# Patient Record
Sex: Female | Born: 1970
Health system: Southern US, Community
[De-identification: ages and names within clinical notes are randomized; demographics above are authoritative.]

## PROBLEM LIST (undated history)

## (undated) DIAGNOSIS — R52 Pain, unspecified: Secondary | ICD-10-CM

## (undated) DIAGNOSIS — A419 Sepsis, unspecified organism: Secondary | ICD-10-CM

## (undated) DIAGNOSIS — M1990 Unspecified osteoarthritis, unspecified site: Secondary | ICD-10-CM

## (undated) DIAGNOSIS — M7120 Synovial cyst of popliteal space [Baker], unspecified knee: Secondary | ICD-10-CM

## (undated) DIAGNOSIS — M869 Osteomyelitis, unspecified: Secondary | ICD-10-CM

## (undated) DIAGNOSIS — E669 Obesity, unspecified: Secondary | ICD-10-CM

## (undated) DIAGNOSIS — M545 Low back pain, unspecified: Secondary | ICD-10-CM

## (undated) DIAGNOSIS — T4145XA Adverse effect of unspecified anesthetic, initial encounter: Secondary | ICD-10-CM

## (undated) DIAGNOSIS — Z87442 Personal history of urinary calculi: Secondary | ICD-10-CM

## (undated) DIAGNOSIS — I10 Essential (primary) hypertension: Secondary | ICD-10-CM

## (undated) DIAGNOSIS — S4292XA Fracture of left shoulder girdle, part unspecified, initial encounter for closed fracture: Secondary | ICD-10-CM

## (undated) DIAGNOSIS — F32A Depression, unspecified: Secondary | ICD-10-CM

## (undated) DIAGNOSIS — K602 Anal fissure, unspecified: Secondary | ICD-10-CM

## (undated) DIAGNOSIS — F419 Anxiety disorder, unspecified: Secondary | ICD-10-CM

## (undated) DIAGNOSIS — F329 Major depressive disorder, single episode, unspecified: Secondary | ICD-10-CM

## (undated) DIAGNOSIS — T8859XA Other complications of anesthesia, initial encounter: Secondary | ICD-10-CM

## (undated) HISTORY — DX: Depression, unspecified: F32.A

## (undated) HISTORY — DX: Low back pain, unspecified: M54.50

## (undated) HISTORY — DX: Essential (primary) hypertension: I10

## (undated) HISTORY — PX: TONSILLECTOMY: SUR1361

## (undated) HISTORY — DX: Synovial cyst of popliteal space (Baker), unspecified knee: M71.20

## (undated) HISTORY — PX: CHOLECYSTECTOMY: SHX55

## (undated) HISTORY — DX: Fracture of left shoulder girdle, part unspecified, initial encounter for closed fracture: S42.92XA

## (undated) HISTORY — PX: TOTAL SHOULDER REPLACEMENT: SUR1217

## (undated) HISTORY — PX: APPENDECTOMY: SHX54

## (undated) HISTORY — DX: Anal fissure, unspecified: K60.2

## (undated) HISTORY — DX: Anxiety disorder, unspecified: F41.9

## (undated) HISTORY — PX: LASER ABLATION: SHX1947

## (undated) HISTORY — DX: Sepsis, unspecified organism: A41.9

## (undated) HISTORY — PX: TUBAL LIGATION: SHX77

## (undated) HISTORY — DX: Obesity, unspecified: E66.9

## (undated) HISTORY — DX: Osteomyelitis, unspecified: M86.9

## (undated) HISTORY — DX: Major depressive disorder, single episode, unspecified: F32.9

## (undated) HISTORY — DX: Low back pain: M54.5

---

## 1994-01-07 HISTORY — PX: PLANTAR FASCIA RELEASE: SHX2239

## 1995-01-08 HISTORY — PX: CHOLECYSTECTOMY: SHX55

## 1997-09-23 ENCOUNTER — Other Ambulatory Visit: Admission: RE | Admit: 1997-09-23 | Discharge: 1997-09-23 | Payer: Self-pay | Admitting: Obstetrics and Gynecology

## 1998-10-23 ENCOUNTER — Other Ambulatory Visit: Admission: RE | Admit: 1998-10-23 | Discharge: 1998-10-23 | Payer: Self-pay | Admitting: Obstetrics and Gynecology

## 1999-10-24 ENCOUNTER — Other Ambulatory Visit: Admission: RE | Admit: 1999-10-24 | Discharge: 1999-10-24 | Payer: Self-pay | Admitting: Obstetrics and Gynecology

## 2000-06-19 ENCOUNTER — Encounter: Payer: Self-pay | Admitting: Obstetrics and Gynecology

## 2000-06-19 ENCOUNTER — Ambulatory Visit (HOSPITAL_COMMUNITY): Admission: RE | Admit: 2000-06-19 | Discharge: 2000-06-19 | Payer: Self-pay | Admitting: Obstetrics and Gynecology

## 2000-11-11 ENCOUNTER — Other Ambulatory Visit: Admission: RE | Admit: 2000-11-11 | Discharge: 2000-11-11 | Payer: Self-pay | Admitting: Obstetrics and Gynecology

## 2002-01-07 HISTORY — PX: TOTAL SHOULDER REPLACEMENT: SUR1217

## 2002-01-07 HISTORY — PX: SHOULDER ARTHROSCOPY WITH OPEN ROTATOR CUFF REPAIR AND DISTAL CLAVICLE ACROMINECTOMY: SHX5683

## 2002-01-10 ENCOUNTER — Encounter: Payer: Self-pay | Admitting: Emergency Medicine

## 2002-01-10 ENCOUNTER — Inpatient Hospital Stay (HOSPITAL_COMMUNITY): Admission: EM | Admit: 2002-01-10 | Discharge: 2002-01-13 | Payer: Self-pay

## 2002-01-11 ENCOUNTER — Encounter: Payer: Self-pay | Admitting: Orthopedic Surgery

## 2002-01-26 ENCOUNTER — Inpatient Hospital Stay (HOSPITAL_COMMUNITY): Admission: RE | Admit: 2002-01-26 | Discharge: 2002-01-31 | Payer: Self-pay | Admitting: Orthopedic Surgery

## 2002-01-26 ENCOUNTER — Encounter: Payer: Self-pay | Admitting: Orthopedic Surgery

## 2002-01-28 ENCOUNTER — Encounter: Payer: Self-pay | Admitting: Orthopedic Surgery

## 2002-02-16 ENCOUNTER — Ambulatory Visit (HOSPITAL_COMMUNITY): Admission: RE | Admit: 2002-02-16 | Discharge: 2002-02-16 | Payer: Self-pay | Admitting: Orthopedic Surgery

## 2002-02-22 ENCOUNTER — Other Ambulatory Visit: Admission: RE | Admit: 2002-02-22 | Discharge: 2002-02-22 | Payer: Self-pay | Admitting: Obstetrics and Gynecology

## 2002-02-26 ENCOUNTER — Encounter: Payer: Self-pay | Admitting: Orthopedic Surgery

## 2002-02-26 ENCOUNTER — Encounter: Admission: RE | Admit: 2002-02-26 | Discharge: 2002-02-26 | Payer: Self-pay | Admitting: Orthopedic Surgery

## 2002-03-19 ENCOUNTER — Encounter: Payer: Self-pay | Admitting: Orthopedic Surgery

## 2002-03-19 ENCOUNTER — Ambulatory Visit (HOSPITAL_COMMUNITY): Admission: RE | Admit: 2002-03-19 | Discharge: 2002-03-19 | Payer: Self-pay | Admitting: Orthopedic Surgery

## 2002-03-23 ENCOUNTER — Ambulatory Visit (HOSPITAL_COMMUNITY): Admission: RE | Admit: 2002-03-23 | Discharge: 2002-03-24 | Payer: Self-pay | Admitting: Orthopedic Surgery

## 2002-03-23 ENCOUNTER — Encounter: Payer: Self-pay | Admitting: Orthopedic Surgery

## 2002-08-16 ENCOUNTER — Encounter: Payer: Self-pay | Admitting: Orthopedic Surgery

## 2002-08-18 ENCOUNTER — Encounter: Payer: Self-pay | Admitting: Orthopedic Surgery

## 2002-08-24 ENCOUNTER — Encounter: Payer: Self-pay | Admitting: Orthopedic Surgery

## 2002-08-24 ENCOUNTER — Observation Stay (HOSPITAL_COMMUNITY): Admission: RE | Admit: 2002-08-24 | Discharge: 2002-08-25 | Payer: Self-pay | Admitting: Orthopedic Surgery

## 2003-01-08 DIAGNOSIS — A419 Sepsis, unspecified organism: Secondary | ICD-10-CM

## 2003-01-08 HISTORY — DX: Sepsis, unspecified organism: A41.9

## 2003-02-24 ENCOUNTER — Other Ambulatory Visit: Admission: RE | Admit: 2003-02-24 | Discharge: 2003-02-24 | Payer: Self-pay | Admitting: Obstetrics and Gynecology

## 2003-07-13 ENCOUNTER — Ambulatory Visit (HOSPITAL_COMMUNITY): Admission: RE | Admit: 2003-07-13 | Discharge: 2003-07-13 | Payer: Self-pay | Admitting: Orthopaedic Surgery

## 2004-01-08 HISTORY — PX: TUBAL LIGATION: SHX77

## 2004-01-08 HISTORY — PX: LASER ABLATION: SHX1947

## 2004-01-12 ENCOUNTER — Ambulatory Visit: Payer: Self-pay | Admitting: Internal Medicine

## 2004-03-12 ENCOUNTER — Other Ambulatory Visit: Admission: RE | Admit: 2004-03-12 | Discharge: 2004-03-12 | Payer: Self-pay | Admitting: Obstetrics and Gynecology

## 2004-03-16 ENCOUNTER — Encounter: Admission: RE | Admit: 2004-03-16 | Discharge: 2004-03-16 | Payer: Self-pay | Admitting: Obstetrics and Gynecology

## 2004-04-19 ENCOUNTER — Ambulatory Visit: Payer: Self-pay | Admitting: Internal Medicine

## 2004-06-20 ENCOUNTER — Ambulatory Visit: Payer: Self-pay | Admitting: Internal Medicine

## 2004-07-24 ENCOUNTER — Ambulatory Visit: Payer: Self-pay | Admitting: Internal Medicine

## 2004-09-20 ENCOUNTER — Ambulatory Visit: Payer: Self-pay | Admitting: Internal Medicine

## 2005-01-25 ENCOUNTER — Ambulatory Visit: Payer: Self-pay | Admitting: Internal Medicine

## 2005-04-01 ENCOUNTER — Other Ambulatory Visit: Admission: RE | Admit: 2005-04-01 | Discharge: 2005-04-01 | Payer: Self-pay | Admitting: Obstetrics and Gynecology

## 2005-04-29 ENCOUNTER — Ambulatory Visit: Payer: Self-pay | Admitting: Internal Medicine

## 2005-05-26 IMAGING — XA IR CV CATH FLUORO GUIDE
1 series · 1 of 1 positions shown · non-contrast
Comparison: none

CLINICAL DATA: Post-op infection, 4 to 6 weeks of IV antibiotic therapy.
TECHNIQUE: The right arm was prepped with Betadine, draped in the usual sterile fashion, and infiltrated locally with 1% lidocaine.  Under real-time ultrasound guidance, the right basilic vein was accessed with a 21-gauge micropuncture needle.  The needle was exchanged over a guidewire for a peel-away sheath, through which a 5 french single lumen PICC catheter trimmed to 45 cm was advanced, positioned with its tip at the distal SVC/right atrial junction.  Spot chest radiograph confirms appropriate catheter position.  The catheter was flushed with heparinized saline, secured to the skin with Prolene sutures, and covered with a sterile dressing.  No immediate complication.
This procedure was performed by Erkki Ja Pirjo Ratas, PA.  Dr. Gio M Dorantes was the supervising physician.  
IMPRESSION
Technically successful right arm PICC placement with ultrasound.  Ready for routine use.

[Series 1: run · 1 of 1 slices shown]
[im 1/1]
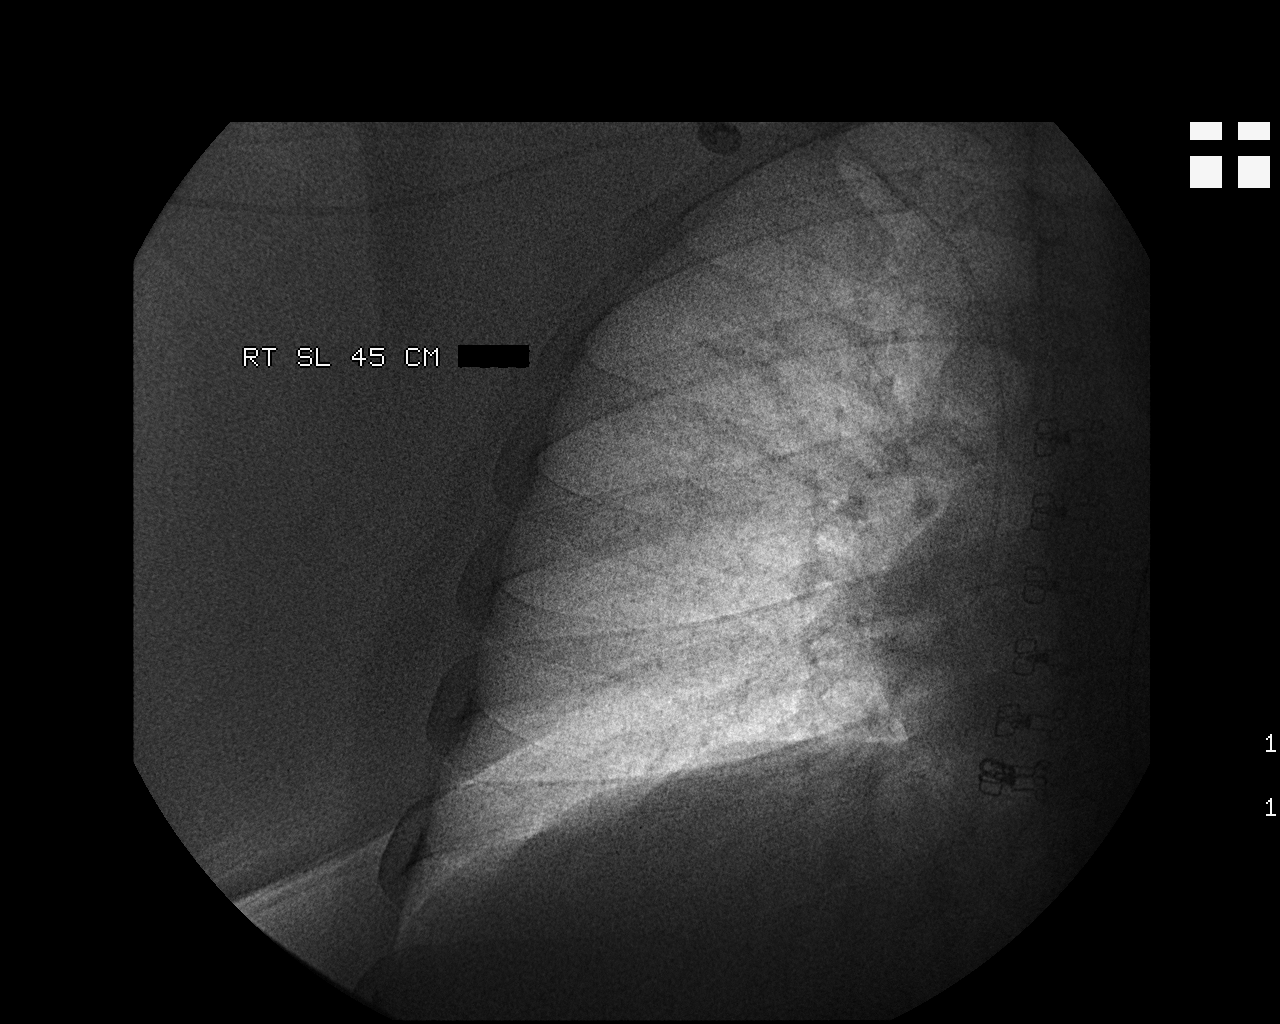

[1 of 1 positions shown; findings below may reference images not displayed]

## 2005-05-31 ENCOUNTER — Encounter (INDEPENDENT_AMBULATORY_CARE_PROVIDER_SITE_OTHER): Payer: Self-pay | Admitting: Specialist

## 2005-05-31 ENCOUNTER — Ambulatory Visit (HOSPITAL_COMMUNITY): Admission: RE | Admit: 2005-05-31 | Discharge: 2005-05-31 | Payer: Self-pay | Admitting: Obstetrics and Gynecology

## 2005-07-29 ENCOUNTER — Ambulatory Visit: Payer: Self-pay | Admitting: Internal Medicine

## 2006-06-06 ENCOUNTER — Emergency Department (HOSPITAL_COMMUNITY): Admission: EM | Admit: 2006-06-06 | Discharge: 2006-06-06 | Payer: Self-pay | Admitting: Family Medicine

## 2006-08-28 ENCOUNTER — Encounter: Payer: Self-pay | Admitting: Internal Medicine

## 2006-08-28 DIAGNOSIS — F32A Depression, unspecified: Secondary | ICD-10-CM | POA: Insufficient documentation

## 2006-08-28 DIAGNOSIS — M545 Low back pain, unspecified: Secondary | ICD-10-CM | POA: Insufficient documentation

## 2006-08-28 DIAGNOSIS — F329 Major depressive disorder, single episode, unspecified: Secondary | ICD-10-CM

## 2006-08-29 ENCOUNTER — Ambulatory Visit: Payer: Self-pay | Admitting: Internal Medicine

## 2006-08-29 LAB — CONVERTED CEMR LAB
Basophils Relative: 0.9 % (ref 0.0–1.0)
Bilirubin Urine: NEGATIVE
Bilirubin, Direct: 0.1 mg/dL (ref 0.0–0.3)
CO2: 29 meq/L (ref 19–32)
Eosinophils Relative: 1.3 % (ref 0.0–5.0)
GFR calc Af Amer: 146 mL/min
Glucose, Bld: 109 mg/dL — ABNORMAL HIGH (ref 70–99)
HDL: 31.8 mg/dL — ABNORMAL LOW (ref >39.0)
Hemoglobin: 13.9 g/dL (ref 12.0–15.0)
Leukocytes, UA: NEGATIVE
Lymphocytes Relative: 25 % (ref 12.0–46.0)
Monocytes Absolute: 0.8 10*3/uL — ABNORMAL HIGH (ref 0.2–0.7)
Monocytes Relative: 7.8 % (ref 3.0–11.0)
Neutro Abs: 6.4 10*3/uL (ref 1.4–7.7)
Neutrophils Relative %: 65 % (ref 43.0–77.0)
Potassium: 4.7 meq/L (ref 3.5–5.1)
Sodium: 138 meq/L (ref 135–145)
Specific Gravity, Urine: 1.02 (ref 1.000–1.03)
Total Protein, Urine: NEGATIVE mg/dL
Total Protein: 6.8 g/dL (ref 6.0–8.3)
Triglycerides: 69 mg/dL (ref 0–149)
WBC: 9.9 10*3/uL (ref 4.5–10.5)

## 2006-12-26 ENCOUNTER — Ambulatory Visit: Payer: Self-pay | Admitting: Internal Medicine

## 2006-12-26 DIAGNOSIS — F419 Anxiety disorder, unspecified: Secondary | ICD-10-CM | POA: Insufficient documentation

## 2006-12-26 DIAGNOSIS — I10 Essential (primary) hypertension: Secondary | ICD-10-CM | POA: Insufficient documentation

## 2006-12-26 DIAGNOSIS — F410 Panic disorder [episodic paroxysmal anxiety] without agoraphobia: Secondary | ICD-10-CM | POA: Insufficient documentation

## 2006-12-26 DIAGNOSIS — J45909 Unspecified asthma, uncomplicated: Secondary | ICD-10-CM | POA: Insufficient documentation

## 2007-01-08 HISTORY — PX: FRACTURE SURGERY: SHX138

## 2007-02-03 ENCOUNTER — Encounter: Admission: RE | Admit: 2007-02-03 | Discharge: 2007-02-03 | Payer: Self-pay | Admitting: Obstetrics and Gynecology

## 2007-03-30 ENCOUNTER — Ambulatory Visit: Payer: Self-pay | Admitting: Internal Medicine

## 2007-04-24 ENCOUNTER — Encounter: Payer: Self-pay | Admitting: Internal Medicine

## 2007-05-19 ENCOUNTER — Encounter: Payer: Self-pay | Admitting: Internal Medicine

## 2007-06-03 ENCOUNTER — Emergency Department (HOSPITAL_COMMUNITY): Admission: EM | Admit: 2007-06-03 | Discharge: 2007-06-03 | Payer: Self-pay | Admitting: Emergency Medicine

## 2007-06-19 ENCOUNTER — Encounter: Payer: Self-pay | Admitting: Internal Medicine

## 2007-07-28 ENCOUNTER — Ambulatory Visit: Payer: Self-pay | Admitting: Internal Medicine

## 2007-07-28 DIAGNOSIS — F17200 Nicotine dependence, unspecified, uncomplicated: Secondary | ICD-10-CM | POA: Insufficient documentation

## 2007-08-09 ENCOUNTER — Inpatient Hospital Stay (HOSPITAL_COMMUNITY): Admission: EM | Admit: 2007-08-09 | Discharge: 2007-08-11 | Payer: Self-pay | Admitting: Emergency Medicine

## 2007-09-23 ENCOUNTER — Encounter: Payer: Self-pay | Admitting: Internal Medicine

## 2007-10-19 ENCOUNTER — Telehealth: Payer: Self-pay | Admitting: Internal Medicine

## 2007-11-04 ENCOUNTER — Ambulatory Visit: Payer: Self-pay | Admitting: Internal Medicine

## 2007-11-05 ENCOUNTER — Encounter: Payer: Self-pay | Admitting: Internal Medicine

## 2007-11-05 ENCOUNTER — Ambulatory Visit: Payer: Self-pay | Admitting: Internal Medicine

## 2007-11-06 LAB — CONVERTED CEMR LAB
ALT: 18 units/L (ref 0–35)
AST: 17 units/L (ref 0–37)
Albumin: 3.3 g/dL — ABNORMAL LOW (ref 3.5–5.2)
BUN: 9 mg/dL (ref 6–23)
Basophils Absolute: 0 10*3/uL (ref 0.0–0.1)
Basophils Relative: 0.3 % (ref 0.0–3.0)
CO2: 29 meq/L (ref 19–32)
Calcium: 9 mg/dL (ref 8.4–10.5)
Cholesterol: 159 mg/dL (ref 0–200)
Creatinine, Ser: 0.6 mg/dL (ref 0.4–1.2)
Eosinophils Relative: 1.2 % (ref 0.0–5.0)
Hemoglobin: 13.6 g/dL (ref 12.0–15.0)
Hgb A1c MFr Bld: 5.7 % (ref 4.6–6.0)
Ketones, ur: NEGATIVE mg/dL
Leukocytes, UA: NEGATIVE
Lymphocytes Relative: 20.7 % (ref 12.0–46.0)
MCHC: 34.4 g/dL (ref 30.0–36.0)
MCV: 88.2 fL (ref 78.0–100.0)
Neutro Abs: 6.6 10*3/uL (ref 1.4–7.7)
RBC: 4.48 M/uL (ref 3.87–5.11)
Specific Gravity, Urine: 1.025 (ref 1.000–1.03)
TSH: 3.41 microintl units/mL (ref 0.35–5.50)
Total Bilirubin: 0.5 mg/dL (ref 0.3–1.2)
Total Protein: 6.6 g/dL (ref 6.0–8.3)
Triglycerides: 71 mg/dL (ref 0–149)
Urine Glucose: NEGATIVE mg/dL
WBC: 9.4 10*3/uL (ref 4.5–10.5)
pH: 6 (ref 5.0–8.0)

## 2007-12-01 ENCOUNTER — Telehealth: Payer: Self-pay | Admitting: Internal Medicine

## 2007-12-11 ENCOUNTER — Emergency Department (HOSPITAL_COMMUNITY): Admission: EM | Admit: 2007-12-11 | Discharge: 2007-12-11 | Payer: Self-pay | Admitting: Family Medicine

## 2008-02-05 ENCOUNTER — Ambulatory Visit: Payer: Self-pay | Admitting: Internal Medicine

## 2008-05-04 ENCOUNTER — Ambulatory Visit: Payer: Self-pay | Admitting: Internal Medicine

## 2008-05-17 ENCOUNTER — Telehealth: Payer: Self-pay | Admitting: Internal Medicine

## 2008-06-03 ENCOUNTER — Ambulatory Visit: Payer: Self-pay | Admitting: Internal Medicine

## 2008-06-03 DIAGNOSIS — L259 Unspecified contact dermatitis, unspecified cause: Secondary | ICD-10-CM | POA: Insufficient documentation

## 2008-07-12 ENCOUNTER — Emergency Department (HOSPITAL_COMMUNITY): Admission: EM | Admit: 2008-07-12 | Discharge: 2008-07-12 | Payer: Self-pay | Admitting: Family Medicine

## 2008-08-25 ENCOUNTER — Ambulatory Visit: Payer: Self-pay | Admitting: Internal Medicine

## 2008-08-25 LAB — CONVERTED CEMR LAB
Albumin: 3.3 g/dL — ABNORMAL LOW (ref 3.5–5.2)
BUN: 9 mg/dL (ref 6–23)
Basophils Absolute: 0 10*3/uL (ref 0.0–0.1)
Calcium: 8.8 mg/dL (ref 8.4–10.5)
Creatinine, Ser: 0.6 mg/dL (ref 0.4–1.2)
Eosinophils Relative: 1.1 % (ref 0.0–5.0)
GFR calc non Af Amer: 119.27 mL/min (ref >60)
Glucose, Bld: 104 mg/dL — ABNORMAL HIGH (ref 70–99)
HCT: 40.6 % (ref 36.0–46.0)
Hemoglobin: 13.8 g/dL (ref 12.0–15.0)
Lymphocytes Relative: 23.8 % (ref 12.0–46.0)
Lymphs Abs: 2 10*3/uL (ref 0.7–4.0)
Monocytes Relative: 8.8 % (ref 3.0–12.0)
Neutro Abs: 5.7 10*3/uL (ref 1.4–7.7)
RDW: 13.3 % (ref 11.5–14.6)
TSH: 3.63 microintl units/mL (ref 0.35–5.50)
Total Bilirubin: 0.8 mg/dL (ref 0.3–1.2)
WBC: 8.6 10*3/uL (ref 4.5–10.5)

## 2008-08-29 ENCOUNTER — Ambulatory Visit: Payer: Self-pay | Admitting: Internal Medicine

## 2008-09-02 ENCOUNTER — Encounter: Payer: Self-pay | Admitting: Internal Medicine

## 2008-10-04 ENCOUNTER — Ambulatory Visit: Payer: Self-pay | Admitting: Internal Medicine

## 2008-11-03 ENCOUNTER — Ambulatory Visit: Payer: Self-pay | Admitting: Internal Medicine

## 2008-11-18 ENCOUNTER — Encounter: Admission: RE | Admit: 2008-11-18 | Discharge: 2008-11-18 | Payer: Self-pay | Admitting: Orthopedic Surgery

## 2008-12-06 ENCOUNTER — Ambulatory Visit: Payer: Self-pay | Admitting: Internal Medicine

## 2008-12-06 DIAGNOSIS — J069 Acute upper respiratory infection, unspecified: Secondary | ICD-10-CM | POA: Insufficient documentation

## 2008-12-06 DIAGNOSIS — M25519 Pain in unspecified shoulder: Secondary | ICD-10-CM | POA: Insufficient documentation

## 2009-01-05 ENCOUNTER — Encounter: Payer: Self-pay | Admitting: Internal Medicine

## 2009-04-26 ENCOUNTER — Telehealth (INDEPENDENT_AMBULATORY_CARE_PROVIDER_SITE_OTHER): Payer: Self-pay | Admitting: *Deleted

## 2009-06-07 ENCOUNTER — Telehealth (INDEPENDENT_AMBULATORY_CARE_PROVIDER_SITE_OTHER): Payer: Self-pay | Admitting: *Deleted
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
## 2009-08-24 ENCOUNTER — Telehealth: Payer: Self-pay | Admitting: Internal Medicine

## 2009-10-03 ENCOUNTER — Telehealth (INDEPENDENT_AMBULATORY_CARE_PROVIDER_SITE_OTHER): Payer: Self-pay | Admitting: *Deleted

## 2009-10-31 ENCOUNTER — Ambulatory Visit: Payer: Self-pay | Admitting: Internal Medicine

## 2009-10-31 DIAGNOSIS — M25529 Pain in unspecified elbow: Secondary | ICD-10-CM | POA: Insufficient documentation

## 2009-11-22 ENCOUNTER — Telehealth: Payer: Self-pay | Admitting: Internal Medicine

## 2009-12-28 ENCOUNTER — Encounter
Admission: RE | Admit: 2009-12-28 | Discharge: 2009-12-28 | Payer: Self-pay | Source: Home / Self Care | Attending: Obstetrics and Gynecology | Admitting: Obstetrics and Gynecology

## 2010-01-02 ENCOUNTER — Telehealth: Payer: Self-pay | Admitting: Internal Medicine

## 2010-01-05 ENCOUNTER — Ambulatory Visit
Admission: RE | Admit: 2010-01-05 | Discharge: 2010-01-05 | Payer: Self-pay | Source: Home / Self Care | Attending: Internal Medicine | Admitting: Internal Medicine

## 2010-01-05 ENCOUNTER — Encounter: Payer: Self-pay | Admitting: Internal Medicine

## 2010-01-05 DIAGNOSIS — K921 Melena: Secondary | ICD-10-CM | POA: Insufficient documentation

## 2010-01-05 DIAGNOSIS — K6289 Other specified diseases of anus and rectum: Secondary | ICD-10-CM | POA: Insufficient documentation

## 2010-01-07 DIAGNOSIS — K602 Anal fissure, unspecified: Secondary | ICD-10-CM

## 2010-01-07 HISTORY — DX: Anal fissure, unspecified: K60.2

## 2010-01-12 ENCOUNTER — Ambulatory Visit
Admission: RE | Admit: 2010-01-12 | Discharge: 2010-01-12 | Payer: Self-pay | Source: Home / Self Care | Attending: Gastroenterology | Admitting: Gastroenterology

## 2010-01-26 ENCOUNTER — Encounter (INDEPENDENT_AMBULATORY_CARE_PROVIDER_SITE_OTHER): Payer: Self-pay | Admitting: *Deleted

## 2010-01-29 ENCOUNTER — Other Ambulatory Visit: Payer: Self-pay | Admitting: Internal Medicine

## 2010-01-29 ENCOUNTER — Ambulatory Visit
Admission: RE | Admit: 2010-01-29 | Discharge: 2010-01-29 | Payer: Self-pay | Source: Home / Self Care | Attending: Internal Medicine | Admitting: Internal Medicine

## 2010-01-29 LAB — LIPID PANEL
HDL: 36.3 mg/dL — ABNORMAL LOW (ref >39.00)
Total CHOL/HDL Ratio: 5
VLDL: 19.4 mg/dL (ref 0.0–40.0)

## 2010-01-29 LAB — CBC WITH DIFFERENTIAL/PLATELET
Eosinophils Relative: 0.8 % (ref 0.0–5.0)
Lymphocytes Relative: 19.9 % (ref 12.0–46.0)
Monocytes Relative: 8.2 % (ref 3.0–12.0)
Neutrophils Relative %: 70.6 % (ref 43.0–77.0)
Platelets: 274 10*3/uL (ref 150.0–400.0)
WBC: 9.3 10*3/uL (ref 4.5–10.5)

## 2010-01-29 LAB — HEPATIC FUNCTION PANEL
AST: 18 U/L (ref 0–37)
Alkaline Phosphatase: 97 U/L (ref 39–117)
Bilirubin, Direct: 0.1 mg/dL (ref 0.0–0.3)

## 2010-01-29 LAB — BASIC METABOLIC PANEL
CO2: 28 mEq/L (ref 19–32)
Calcium: 9 mg/dL (ref 8.4–10.5)
GFR: 136.58 mL/min (ref >60.00)
Potassium: 4.5 mEq/L (ref 3.5–5.1)
Sodium: 137 mEq/L (ref 135–145)

## 2010-01-29 LAB — URINALYSIS
Leukocytes, UA: NEGATIVE
Nitrite: NEGATIVE
Specific Gravity, Urine: 1.025 (ref 1.000–1.030)
pH: 5.5 (ref 5.0–8.0)

## 2010-01-29 LAB — TSH: TSH: 2.84 u[IU]/mL (ref 0.35–5.50)

## 2010-02-01 ENCOUNTER — Ambulatory Visit
Admission: RE | Admit: 2010-02-01 | Discharge: 2010-02-01 | Payer: Self-pay | Source: Home / Self Care | Attending: Internal Medicine | Admitting: Internal Medicine

## 2010-02-06 NOTE — Progress Notes (Signed)
Summary: xanax  Phone Note Refill Request Message from:  [employer] on June 07, 2009 8:35 AM  Refills Requested: Medication #1:  XANAX 1 MG  TABS 1 three times a day prn # [address] [phone] Last ov 12/06/08  Next Appointment Scheduled: none Initial call taken by: Orlan Leavens,  June 07, 2009 8:36 AM  Follow-up for Phone Call        Is this ok to refill ? Follow-up by: Orlan Leavens,  June 07, 2009 8:36 AM  Additional Follow-up for Phone Call Additional follow up Details #1::        OK 3 ref Additional Follow-up by: Tresa Garter MD,  June 07, 2009 5:49 PM    Additional Follow-up for Phone Call Additional follow up Details #2::    Printed RX Follow-up by: Josph Macho RMA,  June 08, 2009 9:36 AM

## 2010-02-06 NOTE — Progress Notes (Signed)
Summary: REFILL - HYDROCODONE  Phone Note Refill Request   Refills Requested: Medication #1:  HYDROCODONE-ACETAMINOPHEN 5-500 MG  TABS 1 po qid as needed [address]  Initial call taken by: Lamar Sprinkles, CMA,  November 22, 2009 5:22 PM  Follow-up for Phone Call        ok to ref Follow-up by: Tresa Garter MD,  November 22, 2009 5:38 PM    Prescriptions: HYDROCODONE-ACETAMINOPHEN 5-500 MG  TABS (HYDROCODONE-ACETAMINOPHEN) 1 po qid as needed  #120 x 0   Entered by:   Lamar Sprinkles, CMA   Authorized by:   Tresa Garter MD   Signed by:   Lamar Sprinkles, CMA on 11/22/2009   Method used:   Telephoned to ...       [facility] [address] [phone]* (retail)       [address]       [place], [place]  [address]       Ph: [phone]       Fax: [phone]   RxID:   [id number]

## 2010-02-06 NOTE — Progress Notes (Signed)
Summary: Rf Hydroco-Acetamin  Phone Note Refill Request Message from:  Pharmacy  Refills Requested: Medication #1:  HYDROCODONE-ACETAMINOPHEN 5-500 MG  TABS 1 po qid as needed   Dosage confirmed as above?Dosage Confirmed   Supply Requested: 120  Method Requested: Telephone to Pharmacy Next Appointment Scheduled: none Initial call taken by: Lanier Prude, [facility]),  August 24, 2009 1:43 PM  Follow-up for Phone Call        ok to ref Follow-up by: Tresa Garter MD,  August 24, 2009 1:52 PM  Additional Follow-up for Phone Call Additional follow up Details #1::        Rx called to pharmacy Additional Follow-up by: Lanier Prude, [facility]),  August 25, 2009 8:17 AM    Prescriptions: HYDROCODONE-ACETAMINOPHEN 5-500 MG  TABS (HYDROCODONE-ACETAMINOPHEN) 1 po qid as needed  #120 x 0   Entered by:   Lanier Prude, CMA(AAMA)   Authorized by:   Tresa Garter MD   Signed by:   Lanier Prude, CMA(AAMA) on 08/25/2009   Method used:   Telephoned to ...       [facility] [address] [phone]* (retail)       [address]       [place], [place]  [address]       Ph: [phone]       Fax: [phone]   RxID:   [id number]

## 2010-02-06 NOTE — Progress Notes (Signed)
----   Converted from flag ---- ---- 10/02/2009 10:16 AM, Lanier Prude, CMA(AAMA) wrote: Please sched pt for OV with PCP.     Thanks!! ------------------------------ Gave pt appt:  10/10/09 @ 915A w/Dr Plotnikov-phone

## 2010-02-06 NOTE — Assessment & Plan Note (Signed)
Summary: PER FLAG/STACEY-OV--PHONE--STC   Vital Signs:  Patient profile:   40 year old female Height:      68 inches Weight:      387.25 pounds BMI:     59.09 O2 Sat:      97 % Temp:     97.8 degrees F oral Pulse rate:   97 / minute BP sitting:   122 / 68  (left arm) Cuff size:   large  Vitals Entered By: Jarome Lamas (October 31, 2009 11:18 AM) CC: follow up/pb Comments pt not taking phentermine hcl and robaxin/pb   CC:  follow up/pb.  History of Present Illness: The patient presents for a follow up of hypertension, anxiety, depression, hyperlipidemia  C/o wt gain C/o L elbow pain 4 months   Current Medications (verified): 1)  Diovan 160 Mg  Tabs (Valsartan) .... Once Daily 2)  Wellbutrin Sr 100 Mg  Tb12 (Bupropion Hcl) .... Two Times A Day 3)  Hydrocodone-Acetaminophen 5-500 Mg  Tabs (Hydrocodone-Acetaminophen) .[name].. 1 Po Qid As Needed 4)  Xanax 1 Mg  Tabs (Alprazolam) .[name].. 1 Three Times A Day Prn 5)  Ventolin Hfa 108 (90 Base) Mcg/act  Aers (Albuterol Sulfate) .... 2 Inh Qid Prn 6)  Multi-Vitamin  Tabs (Multiple Vitamin) 7)  Vitamin D3 1000 Unit  Tabs (Cholecalciferol) .[name].. 1 By Mouth Daily 8)  Paroxetine Hcl 10 Mg Tabs (Paroxetine Hcl) .... One By Mouth Daily 9)  Furosemide 40 Mg Tabs (Furosemide) .... Take One or Two  Tablet Daily As Needed Swelling 10)  Potassium Chloride Cr 10 Meq  Tbcr (Potassium Chloride) .[name].. 1 By Mouth Once Daily W/furosemide Prn 11)  Phentermine Hcl 37.5 Mg Tabs (Phentermine Hcl) .[name].. 1 By Mouth Qam 12)  Robaxin 100 Mg/ml Soln (Methocarbamol) .[name].. 1 By Mouth Q8hr As Needed  Allergies (verified): No Known Drug Allergies  Past History:  Past Medical History: Last updated: 12/26/2006 Depression Low back pain Asthma Anxiety Hypertension Obesity  Social History: Last updated: 12/26/2006 Occupation: housewife Married Current Smoker Alcohol use-no  Review of Systems       The patient complains of weight gain.  The patient denies  difficulty walking and depression.    Physical Exam  General:  alert and overweight-appearing.   Nose:  no external deformity and no nasal discharge.   Mouth:  no gingival abnormalities and pharynx pink and moist.   Neck:  supple and no masses.   Lungs:  normal respiratory effort and normal breath sounds.   Heart:  normal rate and regular rhythm.   Abdomen:  Bowel sounds positive,abdomen soft and non-tender without masses, organomegaly or hernias noted. Msk:  L elbow is tender in postero-lat aspect Neurologic:  No cranial nerve deficits noted. Station and gait are normal. Plantar reflexes are down-going bilaterally. DTRs are symmetrical throughout. Sensory, motor and coordinative functions appear intact. Skin:  Clear Psych:  Cognition and judgment appear intact   Impression & Recommendations:  Problem # 1:  ELBOW PAIN (ICD-719.42) L tendonitis Assessment New Pennsaid, ice  Problem # 2:  OBESITY, MORBID (ICD-278.01) Assessment: Deteriorated See "Patient Instructions". Lap band is not covered. Risks vs benefits and controversies of a long term phentermine use were discussed.   Problem # 3:  ANXIETY (ICD-300.00) Assessment: Unchanged  Her updated medication list for this problem includes:    Wellbutrin Sr 100 Mg Tb12 (Bupropion hcl) .[name]..[name] Two times a day    Xanax 1 Mg Tabs (Alprazolam) .[name]... 1 three times a day prn  Paroxetine Hcl 10 Mg Tabs (Paroxetine hcl) ..... One by mouth daily  Problem # 4:  DEPRESSION (ICD-311) Assessment: Unchanged  Her updated medication list for this problem includes:    Wellbutrin Sr 100 Mg Tb12 (Bupropion hcl) .[name]..[name] Two times a day    Xanax 1 Mg Tabs (Alprazolam) .[name]... 1 three times a day prn    Paroxetine Hcl 10 Mg Tabs (Paroxetine hcl) ..... One by mouth daily  Problem # 5:  HYPERTENSION (ICD-401.9) Assessment: Unchanged  Her updated medication list for this problem includes:    Diovan 160 Mg Tabs (Valsartan) ..... Once daily     Furosemide 40 Mg Tabs (Furosemide) .[name]... Take one or two  tablet daily as needed swelling  BP today: 122/68 Prior BP: 108/82 (12/06/2008)  Labs Reviewed: K+: 4.4 (08/25/2008) Creat: : 0.6 (08/25/2008)   Chol: 159 (11/05/2007)   HDL: 32.2 (11/05/2007)   LDL: 113 (11/05/2007)   TG: 71 (11/05/2007)  Complete Medication List: 1)  Diovan 160 Mg Tabs (Valsartan) .... Once daily 2)  Wellbutrin Sr 100 Mg Tb12 (Bupropion hcl) .... Two times a day 3)  Hydrocodone-acetaminophen 5-500 Mg Tabs (Hydrocodone-acetaminophen) .[name].. 1 po qid as needed 4)  Xanax 1 Mg Tabs (Alprazolam) .[name].. 1 three times a day prn 5)  Ventolin Hfa 108 (90 Base) Mcg/act Aers (Albuterol sulfate) .... 2 inh qid prn 6)  Multi-vitamin Tabs (Multiple vitamin) 7)  Vitamin D3 1000 Unit Tabs (Cholecalciferol) .[name].. 1 by mouth daily 8)  Paroxetine Hcl 10 Mg Tabs (Paroxetine hcl) .... One by mouth daily 9)  Furosemide 40 Mg Tabs (Furosemide) .... Take one or two  tablet daily as needed swelling 10)  Potassium Chloride Cr 10 Meq Tbcr (Potassium chloride) .[name].. 1 by mouth once daily w/furosemide prn 11)  Phentermine Hcl 37.5 Mg Tabs (Phentermine hcl) .[name].. 1 by mouth qam 12)  Ibuprofen 600 Mg Tabs (Ibuprofen) .[name].. 1 by mouth bid  pc x 1 wk then as needed for  pain 13)  Pennsaid 1.5 % Soln (Diclofenac sodium) .... 3-5 gtt on skin three times a day for pain  Patient Instructions: 1)  Please schedule a follow-up appointment in 3 months. 2)  BMP prior to visit, ICD-9: 3)  Hepatic Panel prior to visit, ICD-9: 4)  Lipid Panel prior to visit, ICD-9: 5)  TSH prior to visit, ICD-9:  v70.0 6)  CBC w/ Diff prior to visit, ICD-9: 7)  Urine-dip prior to visit, ICD-9: Prescriptions: XANAX 1 MG  TABS (ALPRAZOLAM) 1 three times a day prn  #90 x 3   Entered and Authorized by:   Tresa Garter MD   Signed by:   Tresa Garter MD on 10/31/2009   Method used:   Print then Give to Patient   RxID:   [id number] PHENTERMINE HCL 37.5 MG TABS  (PHENTERMINE HCL) 1 by mouth qam  #30 x 2   Entered and Authorized by:   Tresa Garter MD   Signed by:   Tresa Garter MD on 10/31/2009   Method used:   Print then Give to Patient   RxID:   [id number] PENNSAID 1.5 % SOLN (DICLOFENAC SODIUM) 3-5 gtt on skin three times a day for pain  #1 x 3   Entered and Authorized by:   Tresa Garter MD   Signed by:   Tresa Garter MD on 10/31/2009   Method used:   Print then Give to Patient   RxID:   [id number] IBUPROFEN 600 MG TABS (IBUPROFEN)  1 by mouth bid  pc x 1 wk then as needed for  pain  #60 x 3   Entered and Authorized by:   Tresa Garter MD   Signed by:   Tresa Garter MD on 10/31/2009   Method used:   Print then Give to Patient   RxID:   [id number]    Orders Added: 1)  Est. Patient Level IV [14782]

## 2010-02-06 NOTE — Progress Notes (Signed)
Summary: Phentermine refill  Phone Note Refill Request Message from:  Fax from Pharmacy on April 26, 2009 11:31 AM  Refills Requested: Medication #1:  PHENTERMINE HCL 37.5 MG TABS 1 by mouth qam Initial call taken by: Lucious Groves,  April 26, 2009 11:31 AM  Follow-up for Phone Call        ok 2 ref Follow-up by: Tresa Garter MD,  April 26, 2009 1:06 PM    Prescriptions: PHENTERMINE HCL 37.5 MG TABS (PHENTERMINE HCL) 1 by mouth qam  #30 x 2   Entered by:   Ami Bullins CMA   Authorized by:   Tresa Garter MD   Signed by:   Bill Salinas CMA on 04/26/2009   Method used:   Telephoned to ...       [facility] [address] [phone]* (retail)       [address]       [place], [place]  [address]       Ph: [phone] or [phone]       Fax: [phone]   RxID:   [id number]

## 2010-02-08 NOTE — Progress Notes (Signed)
Summary: Rf Hydroco/Aceatm  Phone Note Refill Request Message from:  Pharmacy  Refills Requested: Medication #1:  HYDROCODONE-ACETAMINOPHEN 5-500 MG  TABS 1 po qid as needed   Dosage confirmed as above?Dosage Confirmed   Supply Requested: 120   Last Refilled: 11/23/[address]   Method Requested: Telephone to Pharmacy Next Appointment Scheduled: 02-01-10 Initial call taken by: Lanier Prude, [facility]),  January 02, 2010 11:49 AM  Follow-up for Phone Call        ok to ref  Keep return office visit  Follow-up by: Tresa Garter MD,  January 02, 2010 2:55 PM  Additional Follow-up for Phone Call Additional follow up Details #1::        Rx called to pharmacy Additional Follow-up by: Lanier Prude, [facility]),  January 02, 2010 4:23 PM    Prescriptions: HYDROCODONE-ACETAMINOPHEN 5-500 MG  TABS (HYDROCODONE-ACETAMINOPHEN) 1 po qid as needed  #120 x 0   Entered by:   Lanier Prude, CMA(AAMA)   Authorized by:   Tresa Garter MD   Signed by:   Lanier Prude, CMA(AAMA) on 01/02/2010   Method used:   Telephoned to ...       [facility] [address] [phone]* (retail)       [address]       [place], [place]  [address]       Ph: [phone]       Fax: [phone]   RxID:   [id number]

## 2010-02-08 NOTE — Assessment & Plan Note (Signed)
Summary: BLOOD IN STOOL /NWS   Vital Signs:  Patient profile:   40 year old female Height:      68 inches Weight:      388 pounds BMI:     59.21 O2 Sat:      97 % on Room air Temp:     98.5 degrees F oral Pulse rate:   104 / minute BP sitting:   118 / 86  (left arm) Cuff size:   large  Vitals Entered By: Bill Salinas CMA (January 05, 2010 4:29 PM)  O2 Flow:  Room air CC: pt here with c/o BRB in her stools. She states blood with BM is greater than a spoonful, she has had no relief using tucks pads and states she has no hemrrhoids/ ab   CC:  pt here with c/o BRB in her stools. She states blood with BM is greater than a spoonful and she has had no relief using tucks pads and states she has no hemrrhoids/ ab.  History of Present Illness: C/o pain in anal area  x 2-3 d worse w/BMs - severe pain at times. She has 2-3 nl BMs a day as a rule. She had blood this am and this pm in the toilet bowl - a tea spoon each time; not constipated.  Current Medications (verified): 1)  Diovan 160 Mg  Tabs (Valsartan) .... Once Daily 2)  Wellbutrin Sr 100 Mg  Tb12 (Bupropion Hcl) .... Two Times A Day 3)  Hydrocodone-Acetaminophen 5-500 Mg  Tabs (Hydrocodone-Acetaminophen) .[name].. 1 Po Qid As Needed 4)  Xanax 1 Mg  Tabs (Alprazolam) .[name].. 1 Three Times A Day Prn 5)  Ventolin Hfa 108 (90 Base) Mcg/act  Aers (Albuterol Sulfate) .... 2 Inh Qid Prn 6)  Multi-Vitamin  Tabs (Multiple Vitamin) 7)  Vitamin D3 1000 Unit  Tabs (Cholecalciferol) .[name].. 1 By Mouth Daily 8)  Paroxetine Hcl 10 Mg Tabs (Paroxetine Hcl) .... One By Mouth Daily 9)  Furosemide 40 Mg Tabs (Furosemide) .... Take One or Two  Tablet Daily As Needed Swelling 10)  Potassium Chloride Cr 10 Meq  Tbcr (Potassium Chloride) .[name].. 1 By Mouth Once Daily W/furosemide Prn 11)  Phentermine Hcl 37.5 Mg Tabs (Phentermine Hcl) .[name].. 1 By Mouth Qam 12)  Ibuprofen 600 Mg Tabs (Ibuprofen) .[name].. 1 By Mouth Bid  Pc X 1 Wk Then As Needed For  Pain 13)  Pennsaid 1.5 %  Soln (Diclofenac Sodium) .... 3-5 Gtt On Skin Three Times A Day For Pain  Allergies (verified): No Known Drug Allergies  Past History:  Past Medical History: Last updated: 12/26/2006 Depression Low back pain Asthma Anxiety Hypertension Obesity  Social History: Last updated: 12/26/2006 Occupation: housewife Married Current Smoker Alcohol use-no  Review of Systems       The patient complains of hematochezia.  The patient denies fever, weight loss, melena, and abdominal pain.    Physical Exam  General:  alert and overweight-appearing.   Rectal:  refused   Impression & Recommendations:  Problem # 1:  ANAL OR RECTAL PAIN (ZOX-096.04) likely  a fissure; less likely int hemorrhoid vs other Assessment New She refused rectal exam and anoscopy today. Will treat for presumed fissure. Orders: Gastroenterology Referral (GI) dr Juanda Chance  Problem # 2:  HEMATOCHEZIA (ICD-578.1) due to above Assessment: New  Orders: Gastroenterology Referral (GI)  Complete Medication List: 1)  Diovan 160 Mg Tabs (Valsartan) .... Once daily 2)  Wellbutrin Sr 100 Mg Tb12 (Bupropion hcl) .... Two times a day 3)  Hydrocodone-acetaminophen 5-500 Mg Tabs (Hydrocodone-acetaminophen) .[name].. 1 po qid as needed 4)  Xanax 1 Mg Tabs (Alprazolam) .[name].. 1 three times a day prn 5)  Ventolin Hfa 108 (90 Base) Mcg/act Aers (Albuterol sulfate) .... 2 inh qid prn 6)  Multi-vitamin Tabs (Multiple vitamin) 7)  Vitamin D3 1000 Unit Tabs (Cholecalciferol) .[name].. 1 by mouth daily 8)  Paroxetine Hcl 10 Mg Tabs (Paroxetine hcl) .... One by mouth daily 9)  Furosemide 40 Mg Tabs (Furosemide) .... Take one or two  tablet daily as needed swelling 10)  Potassium Chloride Cr 10 Meq Tbcr (Potassium chloride) .[name].. 1 by mouth once daily w/furosemide prn 11)  Phentermine Hcl 37.5 Mg Tabs (Phentermine hcl) .[name].. 1 by mouth qam 12)  Ibuprofen 600 Mg Tabs (Ibuprofen) .[name].. 1 by mouth bid  pc x 1 wk then as needed for  pain 13)  Pennsaid  1.5 % Soln (Diclofenac sodium) .... 3-5 gtt on skin three times a day for pain 14)  Anusol-hc 2.5 % Crea (Hydrocortisone) .... Use two times a day for hemorrhoids 15)  Anusol-hc 25 Mg Supp (Hydrocortisone acetate) .[name].. 1 pr two times a day for hemorrhoids 16)  Lidocaine 5 % Oint (Lidocaine) .... Use qid  Patient Instructions: 1)  Call if you are not better in a reasonable amount of time or if worse. Go to ER if feeling really bad!  Prescriptions: LIDOCAINE 5 % OINT (LIDOCAINE) use qid  #45 g x 1   Entered and Authorized by:   Tresa Garter MD   Signed by:   Tresa Garter MD on 01/05/2010   Method used:   Print then Give to Patient   RxID:   [id number] ANUSOL-HC 25 MG SUPP (HYDROCORTISONE ACETATE) 1 pr two times a day for hemorrhoids  #20 x 3   Entered and Authorized by:   Tresa Garter MD   Signed by:   Tresa Garter MD on 01/05/2010   Method used:   Print then Give to Patient   RxID:   [id number] ANUSOL-HC 2.5 % CREA (HYDROCORTISONE) use two times a day for hemorrhoids  #45 g x 1   Entered and Authorized by:   Tresa Garter MD   Signed by:   Tresa Garter MD on 01/05/2010   Method used:   Print then Give to Patient   RxID:   [id number] LIDOCAINE 5 % OINT (LIDOCAINE) use qid  #45 g x 1   Entered and Authorized by:   Tresa Garter MD   Signed by:   Tresa Garter MD on 01/05/2010   Method used:   Electronically to        [facility] [phone]* (retail)       [address]       [place], [place]  [address]       Ph: [phone]       Fax: [phone]   RxID:   [id number] ANUSOL-HC 25 MG SUPP (HYDROCORTISONE ACETATE) 1 pr two times a day for hemorrhoids  #20 x 3   Entered and Authorized by:   Tresa Garter MD   Signed by:   Tresa Garter MD on 01/05/2010   Method used:   Electronically to        [facility] [phone]* (retail)       [address]       [place], [place]  [address]        Ph: [phone]  Fax: [phone]   RxID:   [id number] ANUSOL-HC 2.5 % CREA (HYDROCORTISONE) use two times a day for hemorrhoids  #45 g x 1   Entered and Authorized by:   Tresa Garter MD   Signed by:   Tresa Garter MD on 01/05/2010   Method used:   Electronically to        [facility] [phone]* (retail)       [address]       [place], [place]  [address]       Ph: [phone]       Fax: [phone]   RxID:   [id number]    Orders Added: 1)  Gastroenterology Referral [GI] 2)  Est. Patient Level III [[address]]

## 2010-02-08 NOTE — Letter (Signed)
Summary: Pre Visit Letter Revised  [facility]  [address] [place], [place] [address]   Phone: [phone]  Fax: [phone]        01/26/2010 MRN: [id number] Stephanie Harper [address] [place], [place]  [address]             Procedure Date:  03/13/2010 @ 9:30   Direct colon-Dr. Christella Hartigan   Welcome to the Gastroenterology Division at [facility].    You are scheduled to see a nurse for your pre-procedure visit on 02/27/2010 at 11:00 on the 3rd floor at [facility], [address].  We ask that you try to arrive at our office 15 minutes prior to your appointment time to allow for check-in.  Please take a minute to review the attached form.  If you answer "Yes" to one or more of the questions on the first page, we ask that you call the person listed at your earliest opportunity.  If you answer "No" to all of the questions, please complete the rest of the form and bring it to your appointment.    Your nurse visit will consist of discussing your medical and surgical history, your immediate family medical history, and your medications.   If you are unable to list all of your medications on the form, please bring the medication bottles to your appointment and we will list them.  We will need to be aware of both prescribed and over the counter drugs.  We will need to know exact dosage information as well.    Please be prepared to read and sign documents such as consent forms, a financial agreement, and acknowledgement forms.  If necessary, and with your consent, a friend or relative is welcome to sit-in on the nurse visit with you.  Please bring your insurance card so that we may make a copy of it.  If your insurance requires a referral to see a specialist, please bring your referral form from your primary care physician.  No co-pay is required for this nurse visit.     If you cannot keep your appointment, please call [phone] to cancel or reschedule prior to  your appointment date.  This allows [place] the opportunity to schedule an appointment for another patient in need of care.    Thank you for choosing [facility] for your medical needs.  We appreciate the opportunity to care for you.  Please visit [place] at our website  to learn more about our practice.  Sincerely, The Gastroenterology Division

## 2010-02-08 NOTE — Assessment & Plan Note (Signed)
Summary: 3 MO ROV /NWS  #   Vital Signs:  Patient profile:   40 year old female Height:      67 inches Weight:      381 pounds BMI:     59.89 Temp:     98.3 degrees F oral Pulse rate:   88 / minute Pulse rhythm:   regular Resp:     16 per minute BP sitting:   120 / 80  (left arm) Cuff size:   large  Vitals Entered By: Lanier Prude, CMA(AAMA) (February 01, 2010 10:08 AM) CC: 3 mo f/u  Is Patient Diabetic? No   Primary Care Provider:  Jacinta Shoe, MD  CC:  3 mo f/u .  History of Present Illness: F/u ananal fissure - better; HTN, asthma C/o rash on L shin  Current Medications (verified): 1)  Diovan 160 Mg  Tabs (Valsartan) .... Once Daily 2)  Wellbutrin Sr 100 Mg  Tb12 (Bupropion Hcl) .... Two Times A Day 3)  Hydrocodone-Acetaminophen 5-500 Mg  Tabs (Hydrocodone-Acetaminophen) .[name].. 1 Po Qid As Needed 4)  Xanax 1 Mg  Tabs (Alprazolam) .[name].. 1 Three Times A Day Prn 5)  Ventolin Hfa 108 (90 Base) Mcg/act  Aers (Albuterol Sulfate) .... 2 Inh Qid Prn 6)  Vitamin D3 1000 Unit  Tabs (Cholecalciferol) .[name].. 1 By Mouth Daily 7)  Paroxetine Hcl 10 Mg Tabs (Paroxetine Hcl) .... One By Mouth Daily 8)  Furosemide 40 Mg Tabs (Furosemide) .... Take One or Two  Tablet Daily As Needed Swelling 9)  Potassium Chloride Cr 10 Meq  Tbcr (Potassium Chloride) .[name].. 1 By Mouth Once Daily W/furosemide Prn 10)  Phentermine Hcl 37.5 Mg Tabs (Phentermine Hcl) .[name].. 1 By Mouth Qam 11)  Ibuprofen 600 Mg Tabs (Ibuprofen) .[name].. 1 By Mouth Bid  Pc X 1 Wk Then As Needed For  Pain 12)  Pennsaid 1.5 % Soln (Diclofenac Sodium) .... 3-5 Gtt On Skin Three Times A Day For Pain 13)  Anusol-Hc 2.5 % Crea (Hydrocortisone) .... Use Two Times A Day For Hemorrhoids 14)  Lidocaine 5 % Oint (Lidocaine) .... Use Qid 15)  Vitamin B-12 1000 Mcg Tabs (Cyanocobalamin) .[name].. 1 By Mouth Once Daily  Allergies (verified): 1)  ! Adhesive Tape  Past History:  Social History: Last updated: 01/12/2010 Occupation:  housewife Married Current Smoker Alcohol use-no    Past Medical History: Depression Low back pain Asthma Anxiety Hypertension Obesity   Anal fissure 2012 Dr Christella Hartigan  Review of Systems       The patient complains of hematochezia.  The patient denies fever, weight loss, and abdominal pain.    Physical Exam  General:  alert and overweight-appearing.   Mouth:  no gingival abnormalities and pharynx pink and moist.   Lungs:  normal respiratory effort and normal breath sounds.   Heart:  normal rate and regular rhythm.   Abdomen:  Bowel sounds positive,abdomen soft and non-tender without masses, organomegaly or hernias noted. Msk:  L elbow is tender in postero-lat aspect Extremities:  no edema, no ulcers  Skin:  dry skin patches on L anter shin B dist shin hyperemia Psych:  Cognition and judgment appear intact   Impression & Recommendations:  Problem # 1:  ANAL OR RECTAL PAIN (WJX-914.78) Assessment Improved  Problem # 2:  HEMATOCHEZIA (ICD-578.1) Assessment: Unchanged Colon is pending   Problem # 3:  HYPERTENSION (ICD-401.9) Assessment: Unchanged  Her updated medication list for this problem includes:    Diovan 160 Mg Tabs (Valsartan) ..... Once  daily    Furosemide 40 Mg Tabs (Furosemide) .[name]... Take one or two  tablet daily as needed swelling  Problem # 4:  ASTHMA (ICD-493.90) Assessment: Unchanged  Her updated medication list for this problem includes:    Ventolin Hfa 108 (90 Base) Mcg/act Aers (Albuterol sulfate) .[name]... 2 inh qid prn  Problem # 5:  DEPRESSION (ICD-311)  Her updated medication list for this problem includes:    Wellbutrin Sr 100 Mg Tb12 (Bupropion hcl) .[name]..[name] Two times a day    Xanax 1 Mg Tabs (Alprazolam) .[name]... 1 three times a day prn    Paroxetine Hcl 10 Mg Tabs (Paroxetine hcl) ..... One by mouth daily  Problem # 6:  ECZEMA (ICD-692.9) L shin Assessment: New  Her updated medication list for this problem includes:    Triamcinolone Acetonide  0.5 % Crea (Triamcinolone acetonide) ..... Use two times a day prn  Complete Medication List: 1)  Diovan 160 Mg Tabs (Valsartan) .... Once daily 2)  Wellbutrin Sr 100 Mg Tb12 (Bupropion hcl) .... Two times a day 3)  Hydrocodone-acetaminophen 5-500 Mg Tabs (Hydrocodone-acetaminophen) .[name].. 1 po qid as needed 4)  Xanax 1 Mg Tabs (Alprazolam) .[name].. 1 three times a day prn 5)  Ventolin Hfa 108 (90 Base) Mcg/act Aers (Albuterol sulfate) .... 2 inh qid prn 6)  Vitamin D3 1000 Unit Tabs (Cholecalciferol) .[name].. 1 by mouth daily 7)  Paroxetine Hcl 10 Mg Tabs (Paroxetine hcl) .... One by mouth daily 8)  Furosemide 40 Mg Tabs (Furosemide) .... Take one or two  tablet daily as needed swelling 9)  Potassium Chloride Cr 10 Meq Tbcr (Potassium chloride) .[name].. 1 by mouth once daily w/furosemide prn 10)  Phentermine Hcl 37.5 Mg Tabs (Phentermine hcl) .[name].. 1 by mouth qam 11)  Ibuprofen 600 Mg Tabs (Ibuprofen) .[name].. 1 by mouth bid  pc x 1 wk then as needed for  pain 12)  Pennsaid 1.5 % Soln (Diclofenac sodium) .... 3-5 gtt on skin three times a day for pain 13)  Anusol-hc 2.5 % Crea (Hydrocortisone) .... Use two times a day for hemorrhoids 14)  Lidocaine 5 % Oint (Lidocaine) .... Use qid 15)  Vitamin B-12 1000 Mcg Tabs (Cyanocobalamin) .[name].. 1 by mouth once daily 16)  Triamcinolone Acetonide 0.5 % Crea (Triamcinolone acetonide) .... Use two times a day prn  Patient Instructions: 1)  Please schedule a follow-up appointment in 4 months. Prescriptions: TRIAMCINOLONE ACETONIDE 0.5 % CREA (TRIAMCINOLONE ACETONIDE) use two times a day prn  #120 g x 3   Entered and Authorized by:   Tresa Garter MD   Signed by:   Tresa Garter MD on 02/01/2010   Method used:   Electronically to        [facility] [phone]* (retail)       [address]       [place], [place]  [address]       Ph: [phone]       Fax: [phone]   RxID:   [id number] VENTOLIN HFA 108 (90 BASE) MCG/ACT  AERS (ALBUTEROL SULFATE)  2 inh qid prn  #1 x 3   Entered and Authorized by:   Tresa Garter MD   Signed by:   Tresa Garter MD on 02/01/2010   Method used:   Electronically to        [facility] [phone]* (retail)       [address]       [place], [place]  [address]  Ph: [phone]       Fax: [phone]   RxID:   [id number]    Orders Added: 1)  Est. Patient Level IV [[address]]

## 2010-02-08 NOTE — Letter (Signed)
Summary: New Patient letter  [facility]  [address] [place], [place] [address]   Phone: [phone]  Fax: [phone]       01/05/2010 MRN: [id number]  Stephanie Harper [address] [place], [place]  [address]  Dear Stephanie Harper,  Welcome to the Gastroenterology Division at [facility].    You are scheduled to see Dr.  Juanda Chance on 01-12-2010 at 2pm on the 3rd floor at [facility], [address].  We ask that you try to arrive at our office 15 minutes prior to your appointment time to allow for check-in.  We would like you to complete the enclosed self-administered evaluation form prior to your visit and bring it with you on the day of your appointment.  We will review it with you.  Also, please bring a complete list of all your medications or, if you prefer, bring the medication bottles and we will list them.  Please bring your insurance card so that we may make a copy of it.  If your insurance requires a referral to see a specialist, please bring your referral form from your primary care physician.  Co-payments are due at the time of your visit and may be paid by cash, check or credit card.     Your office visit will consist of a consult with your physician (includes a physical exam), any laboratory testing he/she may order, scheduling of any necessary diagnostic testing (e.g. x-ray, ultrasound, CT-scan), and scheduling of a procedure (e.g. Endoscopy, Colonoscopy) if required.  Please allow enough time on your schedule to allow for any/all of these possibilities.    If you cannot keep your appointment, please call [phone] to cancel or reschedule prior to your appointment date.  This allows [place] the opportunity to schedule an appointment for another patient in need of care.  If you do not cancel or reschedule by 5 p.m. the business day prior to your appointment date, you will be charged a $50.00 late cancellation/no-show fee.    Thank you for choosing  [facility] for your medical needs.  We appreciate the opportunity to care for you.  Please visit [place] at our website  to learn more about our practice.                     Sincerely,                                                             The Gastroenterology Division

## 2010-02-08 NOTE — Assessment & Plan Note (Signed)
Summary: ANAL PAIN & BLOOD/YF   History of Present Illness Visit Type: Initial Consult Primary GI MD: Rob Bunting MD Primary Provider: Jacinta Shoe, MD Requesting Provider: Jacinta Shoe, MD Chief Complaint: anal pain & bleeding History of Present Illness:     pleasant 40 year old woman who is here with her mother today. She has had 10 days of anal pain.  Constantly sore, but worse with BM.  She  will have some bleeding with BM.  has  been using hydrocortizone for past couple days.  also lidocaine.  also using stool softeners occasionally.  Never really constipated.    had a fissure 15 years ago, went away without spec treatment.           Current Medications (verified): 1)  Diovan 160 Mg  Tabs (Valsartan) .... Once Daily 2)  Wellbutrin Sr 100 Mg  Tb12 (Bupropion Hcl) .... Two Times A Day 3)  Hydrocodone-Acetaminophen 5-500 Mg  Tabs (Hydrocodone-Acetaminophen) .[name].. 1 Po Qid As Needed 4)  Xanax 1 Mg  Tabs (Alprazolam) .[name].. 1 Three Times A Day Prn 5)  Ventolin Hfa 108 (90 Base) Mcg/act  Aers (Albuterol Sulfate) .... 2 Inh Qid Prn 6)  Vitamin D3 1000 Unit  Tabs (Cholecalciferol) .[name].. 1 By Mouth Daily 7)  Paroxetine Hcl 10 Mg Tabs (Paroxetine Hcl) .... One By Mouth Daily 8)  Furosemide 40 Mg Tabs (Furosemide) .... Take One or Two  Tablet Daily As Needed Swelling 9)  Potassium Chloride Cr 10 Meq  Tbcr (Potassium Chloride) .[name].. 1 By Mouth Once Daily W/furosemide Prn 10)  Phentermine Hcl 37.5 Mg Tabs (Phentermine Hcl) .[name].. 1 By Mouth Qam 11)  Ibuprofen 600 Mg Tabs (Ibuprofen) .[name].. 1 By Mouth Bid  Pc X 1 Wk Then As Needed For  Pain 12)  Pennsaid 1.5 % Soln (Diclofenac Sodium) .... 3-5 Gtt On Skin Three Times A Day For Pain 13)  Anusol-Hc 2.5 % Crea (Hydrocortisone) .... Use Two Times A Day For Hemorrhoids 14)  Anusol-Hc 25 Mg Supp (Hydrocortisone Acetate) .[name].. 1 Pr Two Times A Day For Hemorrhoids 15)  Lidocaine 5 % Oint (Lidocaine) .... Use Qid  Allergies (verified): 1)   ! Adhesive Tape  Past History:  Past Medical History: Depression Low back pain Asthma Anxiety Hypertension Obesity    Past Surgical History: right shoulder replacement s/p replacement comp. by osteomylitis at [facility] L shoulder Fx 2009    Family History: Family History of CAD Female 1st degree relative <50 Family History High cholesterol Family History Hypertension    Social History: Occupation: housewife Married Current Smoker Alcohol use-no    Review of Systems       Pertinent positive and negative review of systems were noted in the above HPI and GI specific review of systems.  All other review of systems was otherwise negative.   Vital Signs:  Patient profile:   40 year old female Height:      67 inches Weight:      385.38 pounds BMI:     60.58 Pulse rate:   60 / minute Pulse rhythm:   regular BP sitting:   100 / 80  (left arm) Cuff size:   large  Vitals Entered By: June McMurray CMA Duncan Dull) (January 12, 2010 2:05 PM)  Physical Exam  Additional Exam:  Constitutional: obese, otherwise generally well appearing Psychiatric: alert and oriented times 3 Eyes: extraocular movements intact Mouth: oropharynx moist, no lesions Neck: supple, no lymphadenopathy Cardiovascular: heart regular rate and rythm Lungs: CTA bilaterally Abdomen: soft,  non-tender, non-distended, no obvious ascites, no peritoneal signs, normal bowel sounds Extremities: no lower extremity edema bilaterally Skin: no lesions on visible extremities rectal exam with female assistant in room: small external anal hemorrhoids, none thrombosed.  small shallow fissure mid line posterior, tender right internal anal canal (no fluctuance).   Impression & Recommendations:  Problem # 1:  hemorrhoids, small fissure physical likely cause of her anal discomfort, intermittent rectal bleeding. She should undergo full colonoscopy when the pains resolve. I have outlined typical management strategy, see below. She  will call my office in 2-3 weeks to report on her symptoms.  Patient Instructions: 1)  Use the rectal suppository twice daily (already prescribed). 2)  Use the topical lidocaine twice daily. 3)  Sitz baths twice daily. 4)  Call Dr. Christella Hartigan' office to report on symptoms in 2-3 weeks. 5)  Will schedule you for colonoscopy when your symptoms improve. 6)  The medication list was reviewed and reconciled.  All changed / newly prescribed medications were explained.  A complete medication list was provided to the patient / caregiver. 7)  The medication list was reviewed and reconciled.  All changed / newly prescribed medications were explained.  A complete medication list was provided to the patient / caregiver.

## 2010-02-09 ENCOUNTER — Telehealth: Payer: Self-pay | Admitting: Internal Medicine

## 2010-02-09 NOTE — Letter (Signed)
Summary: Disability exam/[facility]  Disability exam/[facility]   Imported By: Lester Eagle 01/11/2009 09:37:12  _____________________________________________________________________  External Attachment:    Type:   Image     Comment:   External Document

## 2010-02-22 NOTE — Progress Notes (Signed)
Summary: Phentermine RF  Phone Note Refill Request Message from:  Pharmacy  Refills Requested: Medication #1:  PHENTERMINE HCL 37.5 MG TABS 1 by mouth qam   Dosage confirmed as above?Dosage Confirmed   Supply Requested: 30   Last Refilled: 01/01/2010  Method Requested: Electronic Next Appointment Scheduled: 06-05-10 Initial call taken by: Lanier Prude, [facility]),  February 09, 2010 2:43 PM  Follow-up for Phone Call        ok and 2 ref Follow-up by: Tresa Garter MD,  February 09, 2010 5:33 PM  Additional Follow-up for Phone Call Additional follow up Details #1::        Rx called to pharmacy Additional Follow-up by: Lanier Prude, [facility]),  February 12, 2010 12:53 PM    Prescriptions: PHENTERMINE HCL 37.5 MG TABS (PHENTERMINE HCL) 1 by mouth qam  #30 x 2   Entered by:   Lanier Prude, CMA(AAMA)   Authorized by:   Tresa Garter MD   Signed by:   Lanier Prude, CMA(AAMA) on 02/12/2010   Method used:   Telephoned to ...       [facility] [address] [phone]* (retail)       [address]       [place], [place]  [address]       Ph: [phone]       Fax: [phone]   RxID:   [id number]

## 2010-02-24 ENCOUNTER — Encounter: Payer: Self-pay | Admitting: Internal Medicine

## 2010-02-26 ENCOUNTER — Encounter (INDEPENDENT_AMBULATORY_CARE_PROVIDER_SITE_OTHER): Payer: Self-pay

## 2010-02-27 ENCOUNTER — Encounter: Payer: Self-pay | Admitting: Gastroenterology

## 2010-03-06 NOTE — Miscellaneous (Signed)
Summary: Lec previsit.  Clinical Lists Changes  Medications: Added new medication of MOVIPREP 100 GM  SOLR (PEG-KCL-NACL-NASULF-NA ASC-C) As per prep instructions. - Signed Rx of MOVIPREP 100 GM  SOLR (PEG-KCL-NACL-NASULF-NA ASC-C) As per prep instructions.;  #1 x 0;  Signed;  Entered by: Ulis Rias RN;  Authorized by: Rachael Fee MD;  Method used: [facility] to [facility] [address] [phone]*, [address] [place], [place], [place]  [address], Ph: [phone] or [phone], Fax: [phone] Observations: Added new observation of ALLERGY REV: Done (02/27/2010 10:53)    Prescriptions: MOVIPREP 100 GM  SOLR (PEG-KCL-NACL-NASULF-NA ASC-C) As per prep instructions.  #1 x 0   Entered by:   Ulis Rias RN   Authorized by:   Rachael Fee MD   Signed by:   Ulis Rias RN on 02/27/2010   Method used:   [facility]ronically to        [facility] [address] [phone]* (retail)       [address]       [place], [place]  [address]       Ph: [phone] or [phone]       Fax: [phone]   RxID:   [id number]  Pt came into office for a previsit today. Due to weight restrictions, (378.8 lbs) she was rescheduled at [facility] on 03/22/10 at 10:30am with propofol with Dr Christella Hartigan. Pt will call office if she has any other questions.Ulis Rias RN  February 27, 2010 12:49 PM

## 2010-03-06 NOTE — Letter (Signed)
Summary: [facility] Instructions  [facility]  [address] [place], [place] [address]   Phone: [phone]  Fax: [phone]       MAKAILAH SLAVICK    Dec 13, 1970    MRN: [id number]        Procedure Day Dorna Bloom: Lenor Coffin 03/22/10     Arrival Time: 8:30am     Procedure Time: 10:30am     Location of Procedure:                    [facility] (Outpatient Registration)                       PREPARATION FOR COLONOSCOPY WITH MOVIPREP   Starting 5 days prior to your procedure SATURDAY 03/10  do not eat nuts, seeds, popcorn, corn, beans, peas,  salads, or any raw vegetables.  Do not take any fiber supplements (e.g. Metamucil, Citrucel, and Benefiber).  THE DAY BEFORE YOUR PROCEDURE         DATE: [facility]  03/14  1.  Drink clear liquids the entire day-NO SOLID FOOD  2.  Do not drink anything colored red or purple.  Avoid juices with pulp.  No orange juice.  3.  Drink at least 64 oz. (8 glasses) of fluid/clear liquids during the day to prevent dehydration and help the prep work efficiently.  CLEAR LIQUIDS INCLUDE: Water Jello Ice Popsicles Tea (sugar ok, no milk/cream) Powdered fruit flavored drinks Coffee (sugar ok, no milk/cream) Gatorade Juice: apple, white grape, white cranberry  Lemonade Clear bullion, consomm, broth Carbonated beverages (any kind) Strained chicken noodle soup Hard Candy                             4.  In the morning, mix first dose of MoviPrep solution:    Empty 1 Pouch A and 1 Pouch B into the disposable container    Add lukewarm drinking water to the top line of the container. Mix to dissolve    Refrigerate (mixed solution should be used within 24 hrs)  5.  Begin drinking the prep at 5:00 p.m. The MoviPrep container is divided by 4 marks.   Every 15 minutes drink the solution down to the next mark (approximately 8 oz) until the full liter is complete.   6.  Follow completed prep with 16 oz of clear liquid of your choice  (Nothing red or purple).  Continue to drink clear liquids until bedtime.  7.  Before going to bed, mix second dose of MoviPrep solution:    Empty 1 Pouch A and 1 Pouch B into the disposable container    Add lukewarm drinking water to the top line of the container. Mix to dissolve    Refrigerate  THE DAY OF YOUR PROCEDURE      DATE: THURSDAY 03/15  Beginning at  5:30am  (5 hours before procedure):         1. Every 15 minutes, drink the solution down to the next mark (approx 8 oz) until the full liter is complete.  2. Follow completed prep with 16 oz. of clear liquid of your choice.    3. NPO past midnight except for prep.   MEDICATION INSTRUCTIONS  Unless otherwise instructed, you should take regular prescription medications with a small sip of water   as early as possible the morning of your procedure.   Additional medication instructions: Do not take Furosemide  am of procedure.         OTHER INSTRUCTIONS  You will need a responsible adult at least 39 years of age to accompany you and drive you home.   This person must remain in the waiting room during your procedure.  Wear loose fitting clothing that is easily removed.  Leave jewelry and other valuables at home.  However, you may wish to bring a book to read or  an iPod/MP3 player to listen to music as you wait for your procedure to start.  Remove all body piercing jewelry and leave at home.  Total time from sign-in until discharge is approximately 2-3 hours.  You should go home directly after your procedure and rest.  You can resume normal activities the  day after your procedure.  The day of your procedure you should not:   Drive   Make legal decisions   Operate machinery   Drink alcohol   Return to work  You will receive specific instructions about eating, activities and medications before you leave.    The above instructions have been reviewed and explained to me by   Ulis Rias RN  February 27, 2010 12:03 PM     I fully understand and can verbalize these instructions _____________________________ Date _________

## 2010-03-06 NOTE — Miscellaneous (Signed)
Summary: COLONOSCOPY scheduling  Clinical Lists Changes Patient is now scheduled for COLON w/ PROPOFOL at [facility] d/t her weight- 378.8- on 03/06/10 @ 11:30am. [name] in Pre Visit will inform the patient.  Appended Document: COLONOSCOPY scheduling This is an office day for Dr Christella Hartigan, so we r/s and the next opening @ [facility] with PROPOFOL is 03/22/10 @ 10:30. [name] will inform the patient. Dr Christella Hartigan, you could possibly do this patient on 03/06/10 @ 11:30 @[facility] --BUT YOU ARE FULL in the office, but I said I would ask. Thanks, Aram Beecham  Appended Document: COLONOSCOPY scheduling I cannot do full office and try to squeeze in a lunch case with propofol (they notoriously don't start on time) so 3/15 have to work.  there is no emergency to do it sooner from what I can tell in reviewing her notes.  for future reference, when booking a propofol case for me it should only be on my Non-hospital week EUS thursdays unless I clearly note otherwise.  thanks

## 2010-03-06 NOTE — Procedures (Signed)
Summary: Colon Prep/[facility]  Colon Prep/[facility]   Imported By: Lester Oasis 03/01/2010 07:27:29  _____________________________________________________________________  External Attachment:    Type:   Image     Comment:   External Document

## 2010-03-06 NOTE — Letter (Signed)
Summary: Disability Placard  Disability Placard   Imported By: Lester Walker 02/26/2010 09:49:24  _____________________________________________________________________  External Attachment:    Type:   Image     Comment:   External Document

## 2010-03-13 ENCOUNTER — Other Ambulatory Visit: Payer: Self-pay | Admitting: Gastroenterology

## 2010-03-22 ENCOUNTER — Ambulatory Visit (HOSPITAL_COMMUNITY)
Admission: RE | Admit: 2010-03-22 | Discharge: 2010-03-22 | Disposition: A | Discharge status: Home / Self Care | Payer: BC Managed Care – PPO | Source: Ambulatory Visit | Attending: Gastroenterology | Admitting: Gastroenterology

## 2010-03-22 ENCOUNTER — Other Ambulatory Visit: Payer: BC Managed Care – PPO | Admitting: Gastroenterology

## 2010-03-22 ENCOUNTER — Encounter: Payer: Self-pay | Admitting: Gastroenterology

## 2010-03-22 DIAGNOSIS — K648 Other hemorrhoids: Secondary | ICD-10-CM

## 2010-03-22 DIAGNOSIS — K625 Hemorrhage of anus and rectum: Secondary | ICD-10-CM | POA: Insufficient documentation

## 2010-03-22 DIAGNOSIS — I10 Essential (primary) hypertension: Secondary | ICD-10-CM | POA: Insufficient documentation

## 2010-03-22 LAB — HM COLONOSCOPY

## 2010-03-27 NOTE — Procedures (Signed)
Summary: Colonoscopy  Patient: Stephanie Harper Note: All result statuses are Final unless otherwise noted.  Tests: (1) Colonoscopy (COL)   COL Colonoscopy           DONE     [facility]     [address] [place], [place]  [address]          COLONOSCOPY PROCEDURE REPORT          PATIENT:  Stephanie, Harper  MR#:  [id number]     BIRTHDATE:  11/19/1970, 40 yrs. old  GENDER:  female     ENDOSCOPIST:  Rachael Fee, MD     REF. BY:  Linda Hedges. Plotnikov, M.D.     PROCEDURE DATE:  03/22/2010     PROCEDURE:  Diagnostic Colonoscopy     ASA CLASS:  Class II     INDICATIONS:  minor rectal bleeding     MEDICATIONS:   MAC sedation, administered by CRNA          DESCRIPTION OF PROCEDURE:   After the risks benefits and     alternatives of the procedure were thoroughly explained, informed     consent was obtained.  Digital rectal exam was performed and     revealed no rectal masses.   The [id number]) and EC-[id number]     ([id number]) endoscope was introduced through the anus and advanced     to the cecum, which was identified by both the appendix and     ileocecal valve, without limitations.  The quality of the prep was     good, using MoviPrep.  The instrument was then slowly withdrawn as     the colon was fully examined.     <<PROCEDUREIMAGES>>     FINDINGS:  Internal Hemorrhoids were found. These were small, not     thrombosed.  This was otherwise a normal examination of the colon     (see image001 and image002).  No polyps or cancers were seen.     Retroflexed views in the rectum revealed no abnormalities.    The     scope was then withdrawn from the patient and the procedure     completed.     COMPLICATIONS:  None          ENDOSCOPIC IMPRESSION:     1) Small internal hemorrhoids; use topical ointments as needed     for anal dicomfort     2) Otherwise normal examination     3) No polyps or cancers          RECOMMENDATIONS:     1) You should continue to follow  colorectal cancer screening     guidelines for "routine risk" patients with a repeat colonoscopy     at age 27.          ______________________________     Rachael Fee, MD          n.     eSIGNED:   Rachael Fee at 03/22/2010 10:32 AM          [name], [id number]  Note: An exclamation mark (!) indicates a result that was not dispersed into the flowsheet. Document Creation Date: 03/22/2010 10:33 AM _______________________________________________________________________  (1) Order result status: Final Collection or observation date-time: 03/22/2010 10:24 Requested date-time:  Receipt date-time:  Reported date-time:  Referring Physician:   Ordering Physician: Rob Bunting [phone]) Specimen Source:  Source: Launa Grill Order Number: [id number] Lab site:

## 2010-04-11 ENCOUNTER — Other Ambulatory Visit: Payer: Self-pay | Admitting: *Deleted

## 2010-04-11 MED ORDER — FUROSEMIDE 40 MG PO TABS: 40.0000 mg | ORAL_TABLET | Freq: Every day | ORAL | Status: DC | PRN

## 2010-05-09 ENCOUNTER — Telehealth: Payer: Self-pay | Admitting: *Deleted

## 2010-05-09 MED ORDER — ALPRAZOLAM 1 MG PO TABS: 1.0000 mg | ORAL_TABLET | Freq: Three times a day (TID) | ORAL | Status: DC

## 2010-05-09 NOTE — Telephone Encounter (Signed)
rf phoned in 

## 2010-05-09 NOTE — Telephone Encounter (Signed)
rec rf req for alprazolam 1mg  1 po tid # 90. Last filled 04-10-10. Ok to Rf?

## 2010-05-09 NOTE — Telephone Encounter (Signed)
Ok for refill x 5 
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
## 2010-05-25 NOTE — Op Note (Signed)
   NAMEBEAUX, Stephanie Harper                        ACCOUNT NO.:  [id number]   MEDICAL RECORD NO.:  [id number]                   PATIENT TYPE:  INP   LOCATION:  5008                                 FACILITY:  [facility]   PHYSICIAN:  Burnard Bunting, M.D.                 DATE OF BIRTH:  06-12-1970   DATE OF PROCEDURE:  01/29/2002  DATE OF DISCHARGE:  01/31/2002                                 OPERATIVE REPORT   PREOPERATIVE DIAGNOSIS:  Right shoulder infection.   POSTOPERATIVE DIAGNOSIS:  Right shoulder infection.   PROCEDURE:  Right shoulder repeat excisional debridement with removal of  antibiotic beads.   SURGEON:  Burnard Bunting, M.D.   ANESTHESIA:  General endotracheal.   ESTIMATED BLOOD LOSS:  25 mL.   DRAINS:  None.   DESCRIPTION OF PROCEDURE:  The patient was brought to the operating room  where general endotracheal anesthesia was induced.  Preoperative IV  antibiotics were administered.  The right shoulder was prepped with Duraprep  solution and draped in the usual sterile fashion.  [name] was used to cover  the operative field.  The nylon retention sutures from the shoulder were  removed.  Antibiotic beads from the shoulder were removed. The same number  that were placed were removed.  Using a curet, the skin and subcutaneous  tissue was debrided.  In a similar fashion using the curet, muscle was also  curetted to remove the fibrinous exudate on it.  The incision was then  thoroughly irrigated with pulsatile lavage solution following the excisional  debridement. The hardware appeared in good position. The shoulder moved well  as a unit.  Following thorough irrigation and debridement, a Blake drain was  placed in the shoulder.  The shoulder was then closed using nylon suture in  a far-near/near-far fashion.  The Blake drain was attached to bulb suction  and impervious dressing was placed on the shoulder which was then placed  back into a sling. The patient tolerated the  procedure well without  immediate complications. She was transferred to the recovery room in stable  condition.                                               Burnard Bunting, M.D.    GSD/MEDQ  D:  03/09/2002  T:  03/09/2002  Job:  [id number]

## 2010-05-25 NOTE — Op Note (Signed)
NAMECARLOTTA, Stephanie Harper                        ACCOUNT NO.:  [id number]   MEDICAL RECORD NO.:  [id number]                   PATIENT TYPE:  INP   LOCATION:  5040                                 FACILITY:  [facility]   PHYSICIAN:  Burnard Bunting, M.D.                 DATE OF BIRTH:  11-26-1970   DATE OF PROCEDURE:  08/24/2002  DATE OF DISCHARGE:  08/25/2002                                 OPERATIVE REPORT   PREOPERATIVE DIAGNOSIS:  Left shoulder impingement and greater tuberosity  nonunion.   POSTOPERATIVE DIAGNOSIS:  Left shoulder impingement and greater tuberosity  nonunion.   PROCEDURE:  Left shoulder diagnostic arthroscopy with intra-articular  debridement, subacromial decompression, coracoacromial ligament release and  greater tuberosity malunion take-down and redo open reduction internal  fixation.   SURGEON:  Burnard Bunting, M.D.   ASSISTANT:  Jerolyn Shin. Tresa Res, M.D.   ANESTHESIA:  General endotracheal.   ESTIMATED BLOOD LOSS:  75 mL.   DRAINS:  None.   DESCRIPTION OF PROCEDURE:  The patient was brought to the operating room  where a general endotracheal anesthesia was induced.  Preoperative IV  antibiotics were administered.  The patient was noted to have full range of  motion of her left arm with flexion and external rotation to 30 degrees of  abduction with full forward flexion to 180, internal rotation to 30 degrees  and abduction to about 60 degrees.  Abduction was 90.  The patient was then  prepped with DuraPrep solution and draped in a sterile manner including the  shoulder, elbow and hand were prepped.  The anatomy of the shoulder was  identified including the anterior, posterior and lateral margin of the  acromion joint.  The shoulder joint was insufflated with 20 mL of saline.  Diagnostic arthroscopy was initiated through a posterior portal 2 cm  inferior and medial to the fresh lateral margin of the acromion.  The  anterior portal was created and under direct  visualization, diagnostic  arthroscopy demonstrated mild changes around the biceps tendon.  There was  no unstable spot.  The degenerative fraying was treated.  The rotator cuff  attachment was intact as viewed from the intra-articular surface.  The  glenohumeral surfaces were also intact.  The biceps was located and intra-  articular biceps was intact without tearing.  At this time, the scope was  placed into the subacromial space.  Bursectomy and subacromial decompression  was performed with release of the CA ligament.  At this time, the adequacy  of resection was confirmed using the cutting block technique.  At this  instruments were removed from the portals which were then closed using 3-0  nylon suture except for the lateral portal used for decompression.  This  vertical portal was incorporated into the subsequent incision.  The arm was  covered with Ioban and then incision was made off of the anterior aspect of  the acromion.  Skin and subcutaneous tissue were sharply divided.  Full  thickness skin flaps were developed anteriorly and posteriorly.  The deltoid  fascia was split down to a measured distance of 4 cm from the anterior  margin of the acromion.  Stay suture was then placed.  Finally, the deltoid  was detached about 5 mm anteriorly, 5 mm posteriorly.  This was retracted  and the greater tuberosity malunion was palpated.  Using an osteotome the  malunion piece which has significant portion of the supraspinatus attached  was removed from the bony bed.  The bony bed was fashioned.  Retro cuff  mobilization was performed.  The fragment was then pulled down approximately  8-10 mm and fixed with two 3-5 cancellous screws.  X-rays were used to  confirm good location of the screws.  It should also be noted that this was  supplemented with figure-of-eight and  #5 FiberWire suture placed with the  aid of the Curvtek.  That suture was placed in the muscle-tendon junction.  With careful  fixation, we placed two screws and figure-of-eight #5 FiberWire  tension band.  The arm was taken through a range of motion.  The fracture  was found to be stable.  Following fixation, the shoulder joint was  irrigated.  Deltoid split was closed using a #1 Vicryl suture.  Deltoid was  reattached to the acromion using drill holes and #5 FiberWire.  The skin was  closed using interrupted inverted 2-0 Vicryl and a running 3-0 Prolene.  The  patient tolerated the procedure well without immediate complications.  Bulky  dressing and shoulder splint were applied.                                               Burnard Bunting, M.D.    GSD/MEDQ  D:  10/07/2002  T:  10/07/2002  Job:  [id number]

## 2010-05-25 NOTE — Op Note (Signed)
Stephanie Harper, Stephanie Harper            ACCOUNT NO.:  [id number]   MEDICAL RECORD NO.:  [id number]          PATIENT TYPE:  AMB   LOCATION:  SDC                           FACILITY:  [facility]   PHYSICIAN:  Dois Davenport A. Rivard, M.D. DATE OF BIRTH:  01/15/1970   DATE OF PROCEDURE:  05/31/2005  DATE OF DISCHARGE:                                 OPERATIVE REPORT   PREOPERATIVE DIAGNOSIS:  Desire for sterilization and dysfunctional uterine  bleeding.  Morbid obesity with BMI=52   POSTOPERATIVE DIAGNOSIS:  Desire for sterilization and dysfunctional uterine  bleeding.  Morbid obesity with BMI=52   ANESTHESIA:  General with endotracheal intubation, Dr. Malen Gauze.   PROCEDURE:  Laparoscopy, tubal ligation, hysteroscopy, dilatation and  curettage, endometrial ablation with NovaSure.   SURGEON:  Crist Fat. Rivard, M.D.   ESTIMATED BLOOD LOSS:  Minimal.   DESCRIPTION OF PROCEDURE:  After being informed of the planned procedure  with possible complications including bleeding, infection, injury to bowel,  bladder or ureters, need for laparotomy, irreversibility of tubal ligation  and failure rate of 1/500 as well as complications from the NovaSure  including failure of treatment, uterine perforation, informed consent is  obtained.  The patient is taken to OR #2, given general anesthesia and  endotracheal intubation without complication.  She is placed in the  lithotomy position, prepped and draped in a sterile fashion.  A Foley  catheter is used as an in-and-out catheter to empty her bladder.  A weighted  speculum was inserted.  Anterior lip of the cervix was grasped with a  tenaculum, and an acorn manipulator is placed.   We infiltrate the umbilical area with 10 mL of Marcaine 0.25 and perform an  umbilical incision overlooking the previous incision.  Using retractors, we  make our way down to the fascia which was visually identified and grasped  with Kocher forceps. The fascia is then opened with  scissors.  We then  visualized the perineum, grasp it with Kelly forceps and opened bluntly.  This gives [place] a direct visualization entry to the abdominal cavity.  A  pursestring suture of 0 Vicryl was then placed on the fascia.  A modified  long Hassan trocar is inserted, held in place with a previously placed purse  suture and pneumoperitoneum was inflated with CO2 at a maximum pressure of  20 mmHg.  Please note that the intra-abdominal pressure was increased to 20  due to the patient's body mass index and body habitus.  Her body mass index  is 52. Toal time to intraabdominal entry was 40 minutes. We then enter with  the operative laparoscope and visualized a normal uterus, two normal tubes,  two normal ovaries, and a normal liver.  We are unable to visualize the  posterior cul-de-sac.  The anterior cul-de-sac is normal.   Using a bipolar cauterization with the Kleppinger forceps, we cauterized  each tube in the isthmic ampullary area on distance of 1.5 cm including  mesosalpinx until complete blanching.   Instruments were removed but the trocar is left in place as we proceeded  with our hysteroscopy.  The patient is repositioned.  A weighted speculum is  inserted.  Acorn manipulator was removed, and the uterus was sounded at 9  cm.  The cervical length is at 3.5 cm giving [place] a cavity length of 5.5 cm.  The cervix lets a Hegar dilator #24 enter without difficulty which allows  easy entry of the diagnostic hysteroscope.  Using LR perfusion with a  maximum pressure of 90 mmHg we are able to visualize the endometrial cavity,  which is normal.  There is no polyp or fibroid noted.  Endometrium is  somewhat cystic and it is difficult to visualize the tubal ostia.   Hysteroscope was removed.  A sharp curette is used to do a simple curettage  of the endometrial cavity for study of the endometrium, and then the  NovaSure device is inserted easily, deployed with a cavity width of 2.8 cm.  We  then verify integrity of the cavity, which was confirmed and proceed with  endometrial ablation at a power of 85 with the time of 70 seconds, and the  instruments were then removed.  Hysteroscope was reinserted, and we note  that this time complete blanching of the endometrial cavity.  All uterine  instruments were then removed.  We go back to laparoscopy time, reinflate  the pneumoperitoneum with CO2 and visualized a normal uterus with no injury.  Pneumoperitoneum was evacuated.  Trocar is removed.  The previously placed  pursestring suture is tied after confirming absence of bowel loop through  the incision, and the skin is closed with a subcuticular suture of 3-0  Monocryl and Steri-Strips.   Instruments and sponge count is complete x2.  Estimated blood loss was  minimal.  The procedure is very well tolerated by the patient who is taken  to the recovery room in a well and stable condition.      Crist Fat Rivard, M.D.  Electronically Signed     SAR/MEDQ  D:  05/31/2005  T:  06/01/2005  Job:  [id number]
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
## 2010-05-25 NOTE — Op Note (Signed)
Stephanie Harper, Stephanie Harper                        ACCOUNT NO.:  [id number]   MEDICAL RECORD NO.:  [id number]                   PATIENT TYPE:  OIB   LOCATION:  5036                                 FACILITY:  [facility]   PHYSICIAN:  Burnard Bunting, M.D.                 DATE OF BIRTH:  Jan 19, 1970   DATE OF PROCEDURE:  03/23/2002  DATE OF DISCHARGE:  03/24/2002                                 OPERATIVE REPORT   PREOPERATIVE DIAGNOSIS:  Retained hardware and infection, right shoulder.   POSTOPERATIVE DIAGNOSIS:  Retained hardware and infection, right shoulder.   OPERATION PERFORMED:  Removal of hardware and debridement, right shoulder.  Scar excision.   SURGEON:  Burnard Bunting, M.D.   ANESTHESIA:  General endotracheal.   ESTIMATED BLOOD LOSS:  75 cc.   DRAINS:  Hemovac times one.   CULTURES:  One.   DESCRIPTION OF PROCEDURE:  The patient was brought to the operating room  where general endotracheal anesthesia was induced.  Preoperative IV  antibiotics were  not administered until cultures were obtained.  The  patient was placed in Schlein positioner.  The right shoulder, arm and hand  were prepped with DuraPrep solution and draped in a sterile manner.  [name]  was used to cover the operative field.  The previous deltopectoral incision  was excised.  Scar revision was performed.  The incision itself, which had  hypertrophied to a width of 6 to  7 mm in some places was excised.  The deltopectoral interval was re-  established.  Significant scar tissue made the interval difficult to define.  Part of the anterior deltoid muscle was scarred.  Nonetheless the interval  was defined and the anterior part of the humerus was palpated.  Using blunt  dissection and Cobb dissection on the bone, the anterior edge of the plate  was defined.  The anterior edge of the plate was defined proximally and  distally.  Soft tissue was then elevated off the plate.  Fibrinous tissue  was noted.  A curved  Hohmann retractor was used to retract the deltoid  tissue away from the plate which was then internally rotated.  Axillary  nerve was palpated and protected.  At this time with the self-retaining  square retractor in place and the [facility] elevator retracting the deltoid  muscle, the screw and plate were removed.  The plate was then easily removed  from the bony surface.  Bleeding was noted to come out of the screw holes  which was a good sign for vascularity of the humeral head.  The holes were  curetted.  Cultures were sent of tissue between the plate and the bone.  There were no abscess pockets encountered.  Antibiotics were delivered after  cultures were obtained.  At this time the incision was thoroughly irrigated  with 6L of irrigant and pulse lavage solution.  The incision was then closed  using layered  closure of the deltopectoral interval with 3-0 Vicryl suture.  The skin was closed using inverted interrupted 2-0 Vicryl suture and a  running 3-0 pull-out Prolene.  A bulky dressing was placed.  The patient  tolerated the procedure without immediate complication.                                               Burnard Bunting, M.D.   GSD/MEDQ  D:  03/24/2002  T:  03/25/2002  Job:  [id number]

## 2010-05-25 NOTE — Op Note (Signed)
Stephanie Harper, Stephanie Harper                        ACCOUNT NO.:  [id number]   MEDICAL RECORD NO.:  [id number]                   PATIENT TYPE:  INP   LOCATION:  5008                                 FACILITY:  [facility]   PHYSICIAN:  Burnard Bunting, M.D.                 DATE OF BIRTH:  1971/01/06   DATE OF PROCEDURE:  01/26/2002  DATE OF DISCHARGE:                                 OPERATIVE REPORT   PREOPERATIVE DIAGNOSIS:  Right shoulder infection.   POSTOPERATIVE DIAGNOSIS:  Right shoulder infection.   OPERATION PERFORMED:  1. Right shoulder excisional debridement with irrigation and drainage.  2. Placement of antibiotic beads with Tobramycin and gentamicin times 30.  3. Arthroscopic irrigation of the shoulder joint.   SURGEON:  Burnard Bunting, M.D.   ANESTHESIA:  General endotracheal.   ESTIMATED BLOOD LOSS:  50 cc.   DRAINS:  None.   Antibiotic beads times 30 utilized.   CULTURES:  One of joint fluid, one of grossly purulent material around the  plate were sent.   DESCRIPTION OF PROCEDURE:  The patient was brought to the operating room  where general endotracheal anesthesia was induced.  Preoperative IV  antibiotics were not administered until cultures were obtained.  Antibiotics  administered after cultures were obtained consisted of Zosyn.  The patient  was placed in the Schlein positioner with the head and left arm well padded.  The right arm was then carefully prepped with DuraPrep solution and draped  in a sterile manner.  [name] was used to cover the operative field.  The  entire length of the prior incision was utilized.  Upon cutting through the  skin, the sinus tract extending down to the bone was encountered.  Using  blunt dissection, tissue planes of surgery were developed.  Vicryl sutures  were removed.  Superficial debridement of necrotic appearing skin,  subcutaneous tissue, muscle and fascia was performed.  Partial muscle  resection was observed.  Gross purulence  was noted tracking down to the  inferior portion of the plate.  Fracture fixation appeared stable.  After  superficial debridement, the shoulder was irrigated with 6L of pulsatile  lavage.  Bone debridement was not performed. There was not any grossly  appearing infected bony osteomyelitis.  At this time a posterior portal  incision was made to gain access to the shoulder.  The anterior portal was  then also established and 9L of irrigating solution were then run through  these arthroscopic portals.  After establishing a posterior portal, anterior  portal was next established.  Nine liters of irrigating solution were then  run through the shoulder joint.  The shoulder joint fluid was sent for  culture prior to initiation of irrigation of that joint.  Following  irrigation of the shoulder joint itself, arthroscopic portals were removed.  The incision was closed using a 3-0 nylon suture.  Vancomycin and Tobramycin  beads times 30  were then created and placed on a #2 Ethibond suture.  These  were allowed to harden and were placed adjacent to the plate.  Antibiotic  bead pouch was then created with loose skin closure achieved using 2-0 nylon  suture placed in near-far-far-near fashion.  At this time, [name] was placed  over the incision.  Cultures of the shoulder joint and of the grossly  purulent material from adjacent to the plate were sent.  The patient was  placed back into a sling.  She tolerated the procedure well without  immediate complications.                                                Burnard Bunting, M.D.    GSD/MEDQ  D:  01/27/2002  T:  01/27/2002  Job:  [id number]

## 2010-05-25 NOTE — Discharge Summary (Signed)
   Stephanie Harper, Stephanie Harper                        ACCOUNT NO.:  [id number]   MEDICAL RECORD NO.:  [id number]                   PATIENT TYPE:  INP   LOCATION:  5008                                 FACILITY:  [facility]   PHYSICIAN:  Burnard Bunting, M.D.                 DATE OF BIRTH:  Jan 26, 1970   DATE OF ADMISSION:  01/26/2002  DATE OF DISCHARGE:  01/31/2002                                 DISCHARGE SUMMARY   DISCHARGE DIAGNOSES:  Right shoulder infection.   OPERATIONS/PROCEDURES:  1. January 26, 2002, right shoulder incision and drainage, antibiotic bead     placement with a joint irrigation and debridement.  2. On January 29, 2002, removal of antibiotic beads placed in a drain,     repeat excisional debridement.   HISTORY OF PRESENT ILLNESS:  The patient is a 40 year old patient who  underwent right shoulder open reduction and internal fixation by Dr. Nadara Mustard two weeks prior to admission.  The patient subsequently developed  drainage from her right shoulder incision, which was a large amount.  This  occurred on a Saturday.  Antibiotics were called in for the patient on  Saturday.  I saw the patient on Monday and stopped antibiotics and scheduled  her for I&D.   HOSPITAL COURSE:  The patient underwent I&D on January 26, 2002.  Gross  purulence with foul odor consistent with possible mixed infection was found  and at that time, infectious disease consultation was performed.  She was  placed on Zosyn.  Cultured predictably remained negative due to preoperative  oral antibiotics.  She went back to the OR for repeat excisional  debridement, removal of antibiotic beats and placement of a drain on January 29, 2002.  She was afebrile for two days following discharge.  She was  discharged on January 31, 2002 in good condition.  Drain was removed at the  time of discharge.   DISCHARGE MEDICATIONS:  She is discharged home on Percocet and Augmentin per  infectious disease  recommendation.   FOLLOWUP:  She will follow up with me in about one to two weeks for a drain  and suture removal.   ACTIVITY:  She should start on pendulum exercises.  I will see her back in  one to two weeks.                                                 Burnard Bunting, M.D.    GSD/MEDQ  D:  03/09/2002  T:  03/10/2002  Job:  [id number]

## 2010-05-25 NOTE — H&P (Signed)
NAMECHARISH, SCHROEPFER                        ACCOUNT NO.:  [id number]   MEDICAL RECORD NO.:  [id number]                   PATIENT TYPE:  INP   LOCATION:  2899                                 FACILITY:  [facility]   PHYSICIAN:  Burnard Bunting, M.D.                 DATE OF BIRTH:  04/26/1970   DATE OF ADMISSION:  01/26/2002  DATE OF DISCHARGE:                                HISTORY & PHYSICAL   CHIEF COMPLAINT:  Right shoulder drainage.   HISTORY OF PRESENT ILLNESS:  The patient is a 40 year old right-hand  dominant female who is now two weeks status post open reduction and internal  fixation of a complex proximal humerus fracture.  This was performed by Dr.  Aldean Baker.  The patient had her staples removed and the incision was  closed up until Saturday which is three days ago.  At that time, she reports  spontaneous drainage of dark-colored liquid from the small pinhole in the  mid portion of the incision.  She denies any fever and chills.  She states  that about a quarter cup of drainage came out.  She called the office on  Sunday, January 24, 2002.  After discussion with Dr. Ophelia Charter, I was called in  for her oral antibiotics.  I subsequently saw the patient on Monday  afternoon.  Oral antibiotics were stopped and cultures were obtained.  What  appeared to be hematoma-type drainage was coming out from the small pinhole  through the otherwise well-healed incision.   CURRENT DRUG INTOLERANCE:  DILAUDID and TYLOX.   CURRENT MEDICATIONS:  Vicodin and Ortho Tri-Cyclen.   PAST HISTORY:  Foot surgery x2 for plantar fasciitis and then shoulder  surgery two weeks ago.   FAMILY HISTORY:  Noncontributory.   REVIEW OF SYSTEMS:  No fevers and chills.   PHYSICAL EXAMINATION:  VITAL SIGNS:  Temperature 97.2, blood pressure is  stable.  HEENT:  Normal.  CHEST:  Clear to auscultation.  CARDIAC:  Heart beats regular rhythm.  ABDOMEN:  Benign.  EXTREMITIES:  Right shoulder demonstrates a  well-healed surgical incision.  She has appropriate pain with passive range of motion.  She has intact grip,  flexion and extension strength as well as biceps and triceps strength.  She  has an incision which has a small pinhole through which dark-colored  drainage comes out.  There is no cervical or supraclavicular  lymphadenopathy.   LABORATORY DATA:  Plain x-rays show stable fixation of right shoulder  fracture.   White count 13, hematocrit 37.9.  Sedimentation rate 42.   IMPRESSION:  Probable right shoulder infection which spontaneous drainage  and high white count.   PLAN:  I think the patient needs to go to the OR for irrigation and  debridement.  Hardware will be maintained until union is achieved.  Risks  and benefits of the procedure are discussed with the patient.  I think it is  possible that I will have to use antibiotic beads which will require another  surgery for removal 2-3 days after their placement.  Risk of persistent  infection as well as inability to eradicate the infection because of the  hardware is discussed with the patient.  I did tell her that once bony union  is achieved that the hardware could be removed and infection would be easier  to eradicate at that time.  The patient and husband understand the risks and  benefits and will proceed with surgery.                                               Burnard Bunting, M.D.    GSD/MEDQ  D:  01/26/2002  T:  01/26/2002  Job:  [id number]
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
## 2010-06-05 ENCOUNTER — Encounter: Payer: Self-pay | Admitting: Internal Medicine

## 2010-06-05 ENCOUNTER — Ambulatory Visit (INDEPENDENT_AMBULATORY_CARE_PROVIDER_SITE_OTHER)
Admission: RE | Admit: 2010-06-05 | Discharge: 2010-06-05 | Disposition: A | Discharge status: Home / Self Care | Payer: BC Managed Care – PPO | Source: Ambulatory Visit | Attending: Internal Medicine | Admitting: Internal Medicine

## 2010-06-05 ENCOUNTER — Other Ambulatory Visit (INDEPENDENT_AMBULATORY_CARE_PROVIDER_SITE_OTHER): Payer: BC Managed Care – PPO

## 2010-06-05 ENCOUNTER — Ambulatory Visit (INDEPENDENT_AMBULATORY_CARE_PROVIDER_SITE_OTHER): Payer: BC Managed Care – PPO | Admitting: Internal Medicine

## 2010-06-05 DIAGNOSIS — M545 Low back pain, unspecified: Secondary | ICD-10-CM

## 2010-06-05 DIAGNOSIS — R209 Unspecified disturbances of skin sensation: Secondary | ICD-10-CM

## 2010-06-05 DIAGNOSIS — R202 Paresthesia of skin: Secondary | ICD-10-CM

## 2010-06-05 DIAGNOSIS — R61 Generalized hyperhidrosis: Secondary | ICD-10-CM

## 2010-06-05 DIAGNOSIS — F329 Major depressive disorder, single episode, unspecified: Secondary | ICD-10-CM

## 2010-06-05 DIAGNOSIS — F411 Generalized anxiety disorder: Secondary | ICD-10-CM

## 2010-06-05 LAB — CBC WITH DIFFERENTIAL/PLATELET
Basophils Absolute: 0 10*3/uL (ref 0.0–0.1)
Basophils Relative: 0.2 % (ref 0.0–3.0)
Eosinophils Relative: 0.6 % (ref 0.0–5.0)
HCT: 42 % (ref 36.0–46.0)
Hemoglobin: 14.4 g/dL (ref 12.0–15.0)
Lymphocytes Relative: 17.6 % (ref 12.0–46.0)
Lymphs Abs: 2.2 10*3/uL (ref 0.7–4.0)
Monocytes Relative: 6.4 % (ref 3.0–12.0)
Neutro Abs: 9.3 10*3/uL — ABNORMAL HIGH (ref 1.4–7.7)
RBC: 4.7 Mil/uL (ref 3.87–5.11)
RDW: 14.2 % (ref 11.5–14.6)

## 2010-06-05 LAB — CORTISOL: Cortisol, Plasma: 8.5 ug/dL

## 2010-06-05 LAB — COMPREHENSIVE METABOLIC PANEL
BUN: 13 mg/dL (ref 6–23)
CO2: 31 mEq/L (ref 19–32)
Calcium: 8.8 mg/dL (ref 8.4–10.5)
Chloride: 104 mEq/L (ref 96–112)
Creatinine, Ser: 0.6 mg/dL (ref 0.4–1.2)
GFR: 120.46 mL/min (ref >60.00)
Glucose, Bld: 103 mg/dL — ABNORMAL HIGH (ref 70–99)
Total Bilirubin: 0.7 mg/dL (ref 0.3–1.2)

## 2010-06-05 LAB — URINALYSIS
Bilirubin Urine: NEGATIVE
Hgb urine dipstick: NEGATIVE
Nitrite: NEGATIVE
Total Protein, Urine: NEGATIVE
Urobilinogen, UA: 0.2 (ref 0.0–1.0)

## 2010-06-05 LAB — SEDIMENTATION RATE: Sed Rate: 33 mm/hr — ABNORMAL HIGH (ref 0–22)

## 2010-06-05 LAB — TSH: TSH: 2.07 u[IU]/mL (ref 0.35–5.50)

## 2010-06-05 MED ORDER — HYDROCODONE-ACETAMINOPHEN 5-500 MG PO TABS: 1.0000 | ORAL_TABLET | Freq: Four times a day (QID) | ORAL | Status: DC | PRN

## 2010-06-05 MED ORDER — PHENTERMINE HCL 37.5 MG PO CAPS: 37.5000 mg | ORAL_CAPSULE | ORAL | Status: DC

## 2010-06-05 NOTE — Progress Notes (Signed)
  Subjective:    Patient ID: Stephanie Harper, female    DOB: 1970-03-26, 40 y.o.   MRN: [id number]  HPI   The patient is here to follow up on chronic depression, anxiety, symptoms controlled with medicines, diet and exercise. C/o night sweats x 1 week - bad   Wt Readings from Last 3 Encounters:  06/05/10 383 lb (173.728 kg)  02/01/10 381 lb (172.82 kg)  01/12/10 385 lb 6.1 oz (174.808 kg)      Review of Systems  Constitutional: Positive for chills and diaphoresis. Negative for activity change, appetite change, fatigue and unexpected weight change.  HENT: Negative for ear pain, sneezing and mouth sores.   Eyes: Negative for pain.  Respiratory: Negative for shortness of breath.   Cardiovascular: Negative for leg swelling.  Gastrointestinal: Negative for nausea, vomiting and blood in stool.  Genitourinary: Negative for dysuria, decreased urine volume and pelvic pain.  Musculoskeletal: Negative for gait problem.  Neurological: Negative for numbness.  Psychiatric/Behavioral: Negative for hallucinations. The patient is not nervous/anxious.        Objective:   Physical Exam  Constitutional: She appears well-developed and well-nourished. No distress.       Obese  HENT:  Head: Normocephalic.  Right Ear: External ear normal.  Left Ear: External ear normal.  Nose: Nose normal.  Mouth/Throat: Oropharynx is clear and moist.  Eyes: Conjunctivae are normal. Pupils are equal, round, and reactive to light. Right eye exhibits no discharge. Left eye exhibits no discharge.  Neck: Normal range of motion. Neck supple. No JVD present. No tracheal deviation present. No thyromegaly present.  Cardiovascular: Normal rate, regular rhythm and normal heart sounds.   Pulmonary/Chest: No stridor. No respiratory distress. She has no wheezes.  Abdominal: Soft. Bowel sounds are normal. She exhibits no distension and no mass. There is no tenderness. There is no rebound and no guarding.  Musculoskeletal:  She exhibits no edema and no tenderness.  Lymphadenopathy:    She has no cervical adenopathy.  Neurological: She displays normal reflexes. No cranial nerve deficit. She exhibits normal muscle tone. Coordination normal.  Skin: No rash noted. She is diaphoretic. No erythema.  Psychiatric: She has a normal mood and affect. Her behavior is normal. Judgment and thought content normal.          Assessment & Plan:  Night sweats We will obtain labs, CXR, CXR, PPD  LOW BACK PAIN No change On Rx  DEPRESSION On Rx  ANXIETY On Rx Doing well  OBESITY, MORBID No change    Declined HIV, Hep tests - no need.Marland KitchenMarland Kitchen

## 2010-06-05 NOTE — Assessment & Plan Note (Signed)
No change 

## 2010-06-05 NOTE — Assessment & Plan Note (Signed)
We will obtain labs, CXR, CXR, PPD

## 2010-06-05 NOTE — Patient Instructions (Signed)
Call if sweats persist

## 2010-06-05 NOTE — Assessment & Plan Note (Signed)
On Rx 

## 2010-06-05 NOTE — Progress Notes (Signed)
Addended by: Merrilyn Puma on: 06/05/2010 11:24 AM   Modules accepted: Orders

## 2010-06-05 NOTE — Assessment & Plan Note (Signed)
No change. On Rx 

## 2010-06-05 NOTE — Assessment & Plan Note (Signed)
On Rx Doing well

## 2010-06-29 ENCOUNTER — Other Ambulatory Visit: Payer: Self-pay | Admitting: Internal Medicine

## 2010-07-30 ENCOUNTER — Telehealth: Payer: Self-pay | Admitting: *Deleted

## 2010-07-30 NOTE — Telephone Encounter (Signed)
OK to fill this prescription with additional refills x0 Thank you!  

## 2010-07-30 NOTE — Telephone Encounter (Signed)
Rf req for Hydrco/APAP 5-500 1 po qid prn # 120. Last filled 06-29-10. Ok to Rf?

## 2010-07-31 MED ORDER — HYDROCODONE-ACETAMINOPHEN 5-500 MG PO TABS: 1.0000 | ORAL_TABLET | Freq: Four times a day (QID) | ORAL | Status: DC | PRN

## 2010-08-29 ENCOUNTER — Other Ambulatory Visit: Payer: Self-pay | Admitting: Internal Medicine

## 2010-08-31 ENCOUNTER — Telehealth: Payer: Self-pay | Admitting: Internal Medicine

## 2010-08-31 ENCOUNTER — Other Ambulatory Visit (INDEPENDENT_AMBULATORY_CARE_PROVIDER_SITE_OTHER): Payer: BC Managed Care – PPO

## 2010-08-31 ENCOUNTER — Encounter: Payer: Self-pay | Admitting: Internal Medicine

## 2010-08-31 ENCOUNTER — Ambulatory Visit (INDEPENDENT_AMBULATORY_CARE_PROVIDER_SITE_OTHER): Payer: BC Managed Care – PPO | Admitting: Internal Medicine

## 2010-08-31 DIAGNOSIS — K921 Melena: Secondary | ICD-10-CM

## 2010-08-31 DIAGNOSIS — M25519 Pain in unspecified shoulder: Secondary | ICD-10-CM

## 2010-08-31 DIAGNOSIS — M25512 Pain in left shoulder: Secondary | ICD-10-CM

## 2010-08-31 DIAGNOSIS — I10 Essential (primary) hypertension: Secondary | ICD-10-CM

## 2010-08-31 DIAGNOSIS — R61 Generalized hyperhidrosis: Secondary | ICD-10-CM

## 2010-08-31 DIAGNOSIS — M25511 Pain in right shoulder: Secondary | ICD-10-CM

## 2010-08-31 DIAGNOSIS — F329 Major depressive disorder, single episode, unspecified: Secondary | ICD-10-CM

## 2010-08-31 LAB — CBC WITH DIFFERENTIAL/PLATELET
Basophils Relative: 0.4 % (ref 0.0–3.0)
Eosinophils Absolute: 0.1 10*3/uL (ref 0.0–0.7)
Eosinophils Relative: 0.7 % (ref 0.0–5.0)
HCT: 43.3 % (ref 36.0–46.0)
Lymphs Abs: 2.3 10*3/uL (ref 0.7–4.0)
MCHC: 33.6 g/dL (ref 30.0–36.0)
MCV: 89.1 fl (ref 78.0–100.0)
Monocytes Absolute: 0.8 10*3/uL (ref 0.1–1.0)
Neutrophils Relative %: 70.9 % (ref 43.0–77.0)
Platelets: 292 10*3/uL (ref 150.0–400.0)
WBC: 10.9 10*3/uL — ABNORMAL HIGH (ref 4.5–10.5)

## 2010-08-31 LAB — SEDIMENTATION RATE: Sed Rate: 27 mm/hr — ABNORMAL HIGH (ref 0–22)

## 2010-08-31 NOTE — Progress Notes (Signed)
  Subjective:    Patient ID: Stephanie Harper, female    DOB: 1970/07/09, 40 y.o.   MRN: [id number]  HPI   The patient is here to follow up on chronic depression, anxiety, headaches and chronic moderate fibromyalgia symptoms controlled with medicines, diet and exercise. C/o chronic pain in R shoulder 4/10 - no change over months C/o night sweats - not better. Not checking her temperature. Chills in daytime.  Review of Systems  Constitutional: Negative for chills, activity change, appetite change, fatigue and unexpected weight change.  HENT: Negative for congestion, mouth sores and sinus pressure.   Eyes: Negative for visual disturbance.  Respiratory: Negative for cough and chest tightness.   Gastrointestinal: Negative for nausea and abdominal pain.  Genitourinary: Negative for frequency, difficulty urinating and vaginal pain.  Musculoskeletal: Positive for back pain and arthralgias. Negative for gait problem.  Skin: Negative for pallor and rash.  Neurological: Negative for dizziness, tremors, weakness, numbness and headaches.  Psychiatric/Behavioral: Positive for sleep disturbance and dysphoric mood. Negative for suicidal ideas, confusion and agitation. The patient is nervous/anxious.    Wt Readings from Last 3 Encounters:  08/31/10 386 lb (175.088 kg)  06/05/10 383 lb (173.728 kg)  02/01/10 381 lb (172.82 kg)       Objective:   Physical Exam  Constitutional: She appears well-developed. No distress.       Obese  HENT:  Head: Normocephalic.  Right Ear: External ear normal.  Left Ear: External ear normal.  Nose: Nose normal.  Mouth/Throat: Oropharynx is clear and moist.  Eyes: Conjunctivae are normal. Pupils are equal, round, and reactive to light. Right eye exhibits no discharge. Left eye exhibits no discharge.  Neck: Normal range of motion. Neck supple. No JVD present. No tracheal deviation present. No thyromegaly present.  Cardiovascular: Normal rate, regular rhythm and normal  heart sounds.   Pulmonary/Chest: No stridor. No respiratory distress. She has no wheezes.  Abdominal: Soft. Bowel sounds are normal. She exhibits no distension and no mass. There is no tenderness. There is no rebound and no guarding.  Musculoskeletal: She exhibits tenderness. She exhibits no edema.       B shoulders are tender w/ROM  Lymphadenopathy:    She has no cervical adenopathy.  Neurological: She displays normal reflexes. No cranial nerve deficit. She exhibits normal muscle tone. Coordination normal.  Skin: No rash noted. No erythema.  Psychiatric: Her behavior is normal. Judgment and thought content normal.      Lab Results  Component Value Date   WBC 12.4* 06/05/2010   HGB 14.4 06/05/2010   HCT 42.0 06/05/2010   PLT 306.0 06/05/2010   CHOL 175 01/29/2010   TRIG 97.0 01/29/2010   HDL 36.30* 01/29/2010   ALT 18 06/05/2010   AST 17 06/05/2010   NA 141 06/05/2010   K 4.6 06/05/2010   CL 104 06/05/2010   CREATININE 0.6 06/05/2010   BUN 13 06/05/2010   CO2 31 06/05/2010   TSH 2.07 06/05/2010   HGBA1C 5.7 11/05/2007       Assessment & Plan:

## 2010-08-31 NOTE — Assessment & Plan Note (Signed)
Anusol prn

## 2010-08-31 NOTE — Assessment & Plan Note (Signed)
Not better. R/o osteo. Ref to Dr August Saucer to check R shoulder

## 2010-08-31 NOTE — Assessment & Plan Note (Signed)
OK to try to stop Diovan and watch BP

## 2010-08-31 NOTE — Telephone Encounter (Signed)
Stephanie Harper, please, inform patient that all labs are better  Please, mail the labs to the patient.    Thx

## 2010-08-31 NOTE — Assessment & Plan Note (Signed)
On Rx 

## 2010-09-01 ENCOUNTER — Encounter: Payer: Self-pay | Admitting: Internal Medicine

## 2010-09-04 ENCOUNTER — Telehealth: Payer: Self-pay | Admitting: *Deleted

## 2010-09-04 MED ORDER — HYDROCODONE-ACETAMINOPHEN 5-500 MG PO TABS: 1.0000 | ORAL_TABLET | Freq: Four times a day (QID) | ORAL | Status: DC | PRN

## 2010-09-04 NOTE — Telephone Encounter (Signed)
Rf req for Hydroco/APAP 5-500 1 po qid prn. # 120 Last filled 7.24.12. Ok to Rf?

## 2010-09-04 NOTE — Telephone Encounter (Signed)
OK to fill this prescription with additional refills x2 Thank you!  

## 2010-09-04 NOTE — Telephone Encounter (Signed)
Pt informed/ copies mailed.  

## 2010-09-13 ENCOUNTER — Other Ambulatory Visit: Payer: Self-pay | Admitting: Internal Medicine

## 2010-09-24 ENCOUNTER — Other Ambulatory Visit (HOSPITAL_COMMUNITY): Payer: Self-pay | Admitting: Orthopedic Surgery

## 2010-09-24 DIAGNOSIS — M25511 Pain in right shoulder: Secondary | ICD-10-CM

## 2010-10-02 ENCOUNTER — Ambulatory Visit (HOSPITAL_COMMUNITY)
Admission: RE | Admit: 2010-10-02 | Discharge: 2010-10-02 | Disposition: A | Discharge status: Home / Self Care | Payer: BC Managed Care – PPO | Source: Ambulatory Visit | Attending: Orthopedic Surgery | Admitting: Orthopedic Surgery

## 2010-10-02 ENCOUNTER — Encounter (HOSPITAL_COMMUNITY)
Admission: RE | Admit: 2010-10-02 | Discharge: 2010-10-02 | Disposition: A | Discharge status: Home / Self Care | Payer: BC Managed Care – PPO | Source: Ambulatory Visit | Attending: Orthopedic Surgery | Admitting: Orthopedic Surgery

## 2010-10-02 DIAGNOSIS — M25511 Pain in right shoulder: Secondary | ICD-10-CM

## 2010-10-02 DIAGNOSIS — M25519 Pain in unspecified shoulder: Secondary | ICD-10-CM | POA: Insufficient documentation

## 2010-10-02 IMAGING — CT CT EXTREM UP W/ CM*L*
3 series · 17 of 36 positions shown, 19 images · IV contrast ([ID] [id number])
Comparison: Right humerus and shoulder radiographs 08/08/2007.

CLINICAL DATA: Upper deltoid/biceps pain.  History of right total
shoulder replacement in 7441.  Question infection.

CT OF THE RIGHT UPPER ARM WITHOUT CONTRAST
TECHNIQUE: Multidetector CT imaging of the right upper arm was
performed according to the standard protocol without intravenous
contrast. Multiplanar CT image reconstructions were also generated.

[Series 102: cor rt humerus · coronal · 0.68mm/px · 3 of 62 slices shown]
[im 15/62  bone]
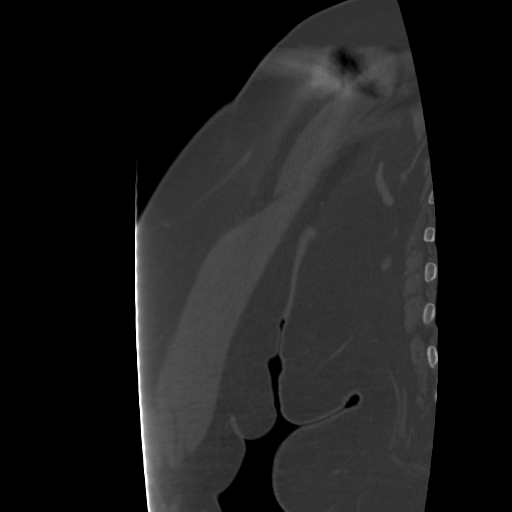
[im 26/62  bone]
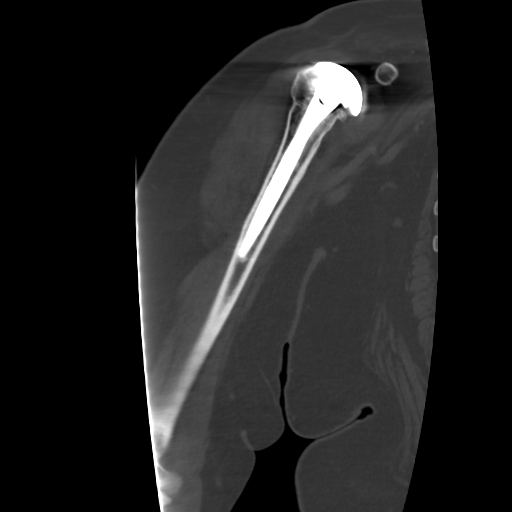
[im 37/62  bone]
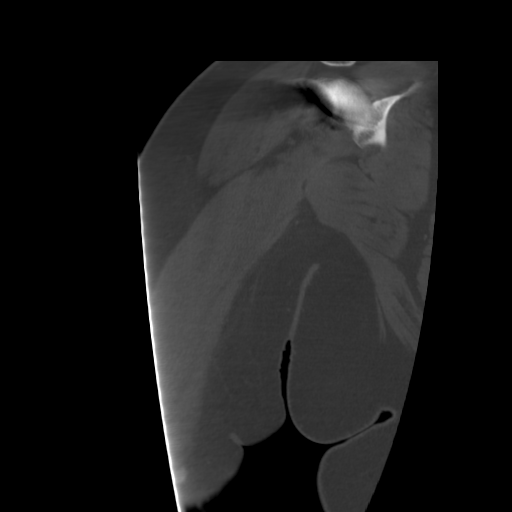

[Series 103: shoulder thin · axial · 0.47mm/px · z∈[-372,-85]mm · 8 of 137 slices shown, 10 images]
[im 11/137  soft-tissue]
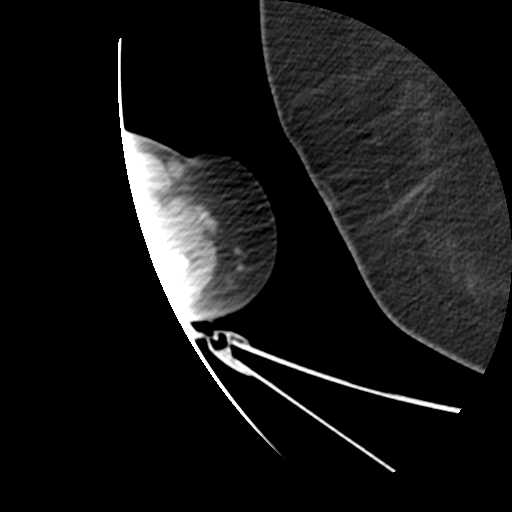
[im 11/137  bone]
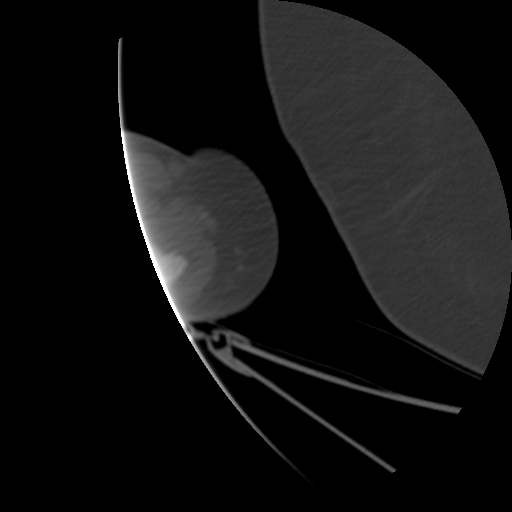
[im 32/137  bone]
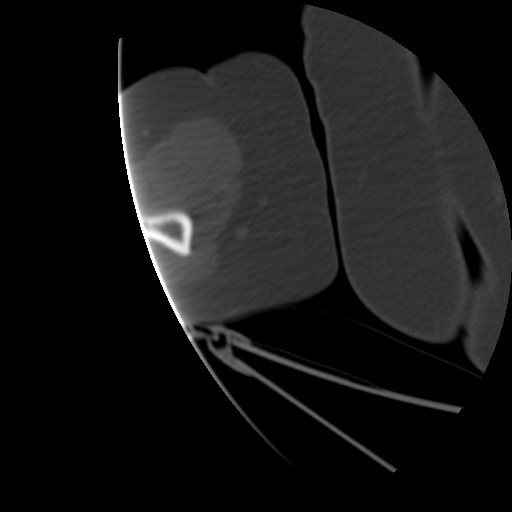
[im 42/137  bone]
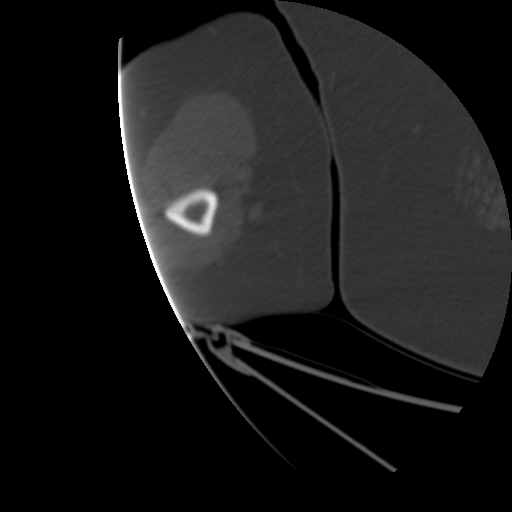
[im 63/137  bone]
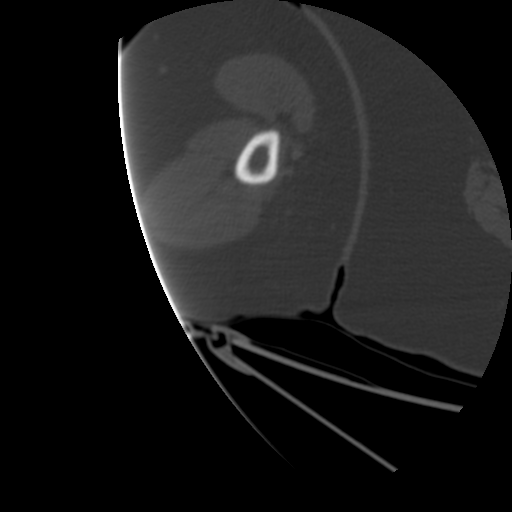
[im 74/137  soft-tissue]
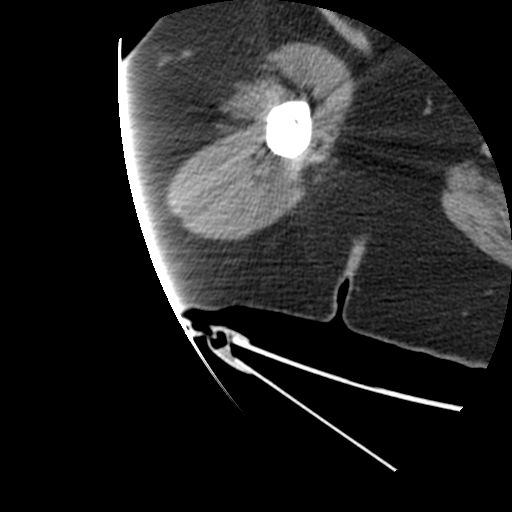
[im 74/137  bone]
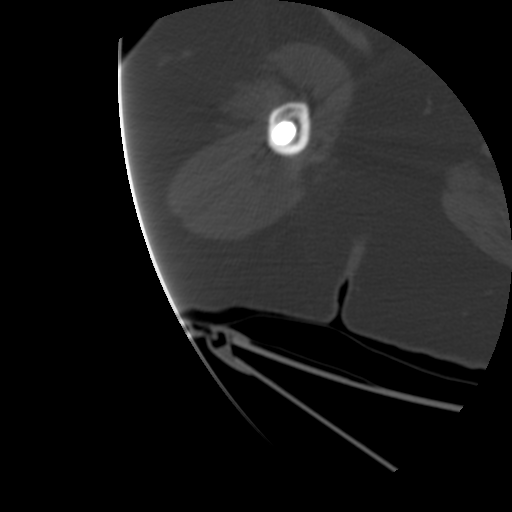
[im 95/137  bone]
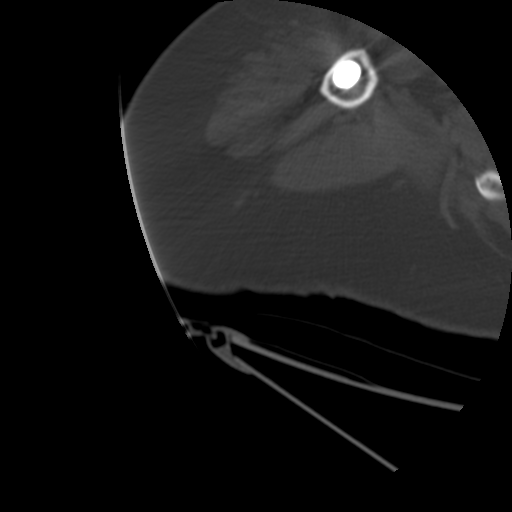
[im 105/137  bone]
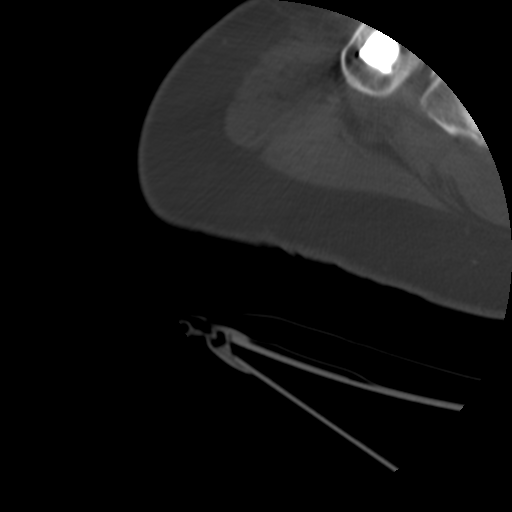
[im 126/137  bone]
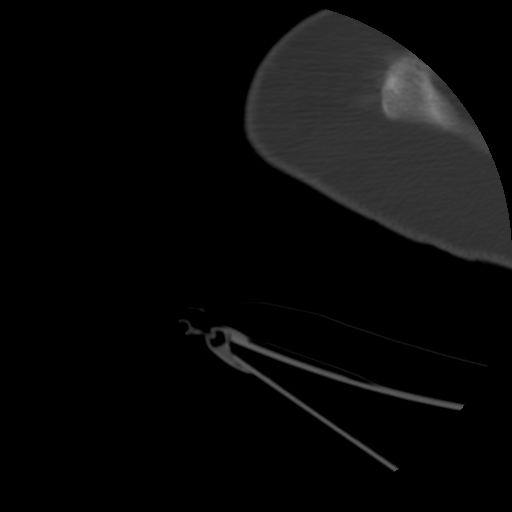

[Series 104: sag rt humerus · sagittal · 0.68mm/px · 6 of 73 slices shown]
[im 30/73  bone]
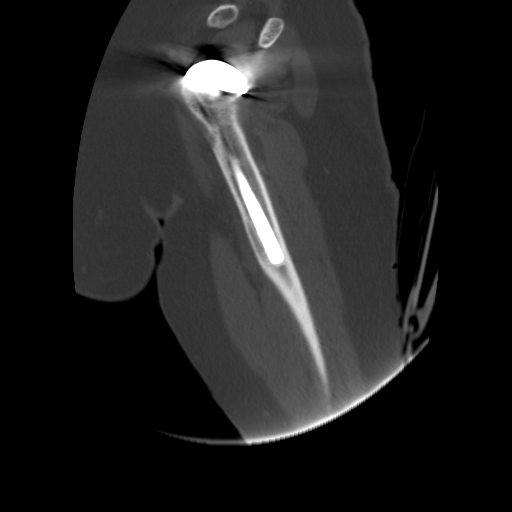
[im 33/73  bone]
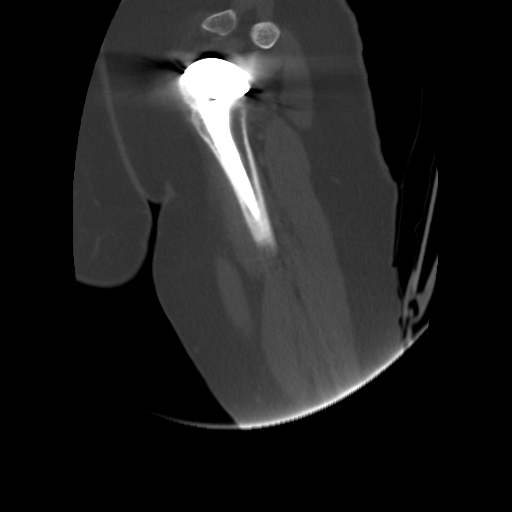
[im 36/73  soft-tissue]
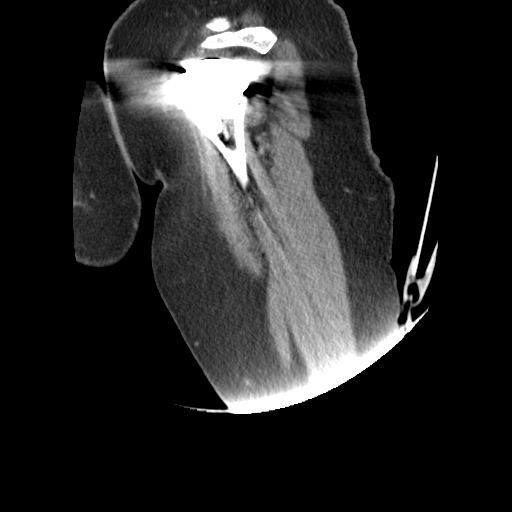
[im 37/73  bone]
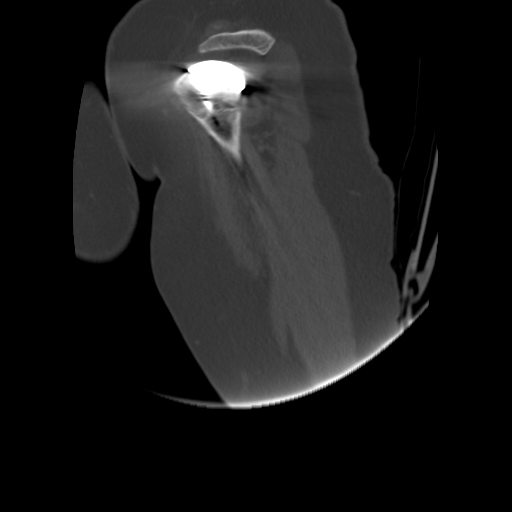
[im 40/73  bone]
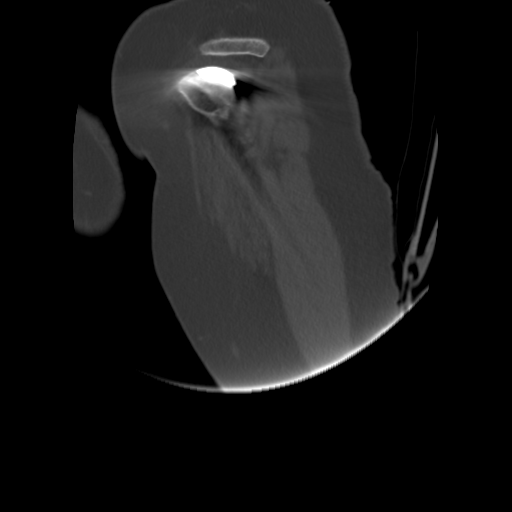
[im 44/73  bone]
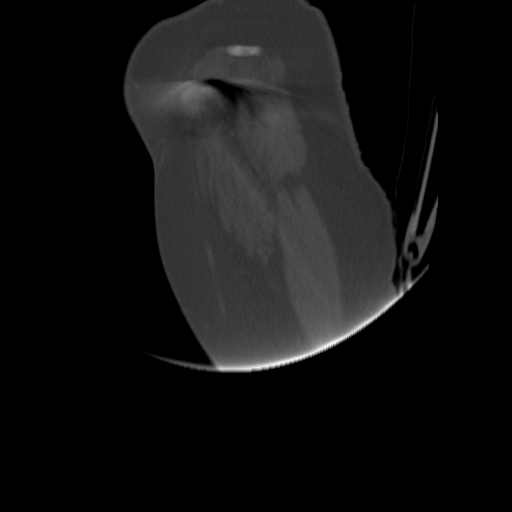

[17 of 36 positions shown; findings below may reference images not displayed]

FINDINGS: The right total shoulder replacement appears well
seated.  There is no evidence of prosthetic loosening.  There is
minimal endosteal thinning laterally at the distal end of the
prosthesis, but no periosteal reaction or fracture is demonstrated.
The humeral head appears located.  There is no evidence of bone
destruction.  The scapula appears normal.

The subacromial space is preserved.  The rotator cuff tendons are
not well visualized, although the corresponding muscles demonstrate
no focal atrophy.

The biceps and deltoid muscles appear normal.  The proximal and
distal aspects of the biceps tendon are not well visualized.  There
is no evidence of periarticular fluid collection or inflammatory
process at the shoulder.  Mildly prominent axillary lymph nodes are
noted, measuring up to 1.3 cm short axis.
IMPRESSION: 1.  Stable appearance of the right shoulder arthroplasty.
2.  No evidence of osteomyelitis or soft tissue infection.  As
evaluated by CT, the deltoid and biceps musculature appears normal.

## 2010-10-02 MED ORDER — TECHNETIUM TC 99M EXAMETAZIME IV KIT
20.0000 | PACK | Freq: Once | INTRAVENOUS | Status: AC | PRN
  Administered 2010-10-02: 20 via INTRAVENOUS

## 2010-10-03 LAB — URINE CULTURE: Colony Count: >=100000

## 2010-10-03 LAB — POCT URINALYSIS DIP (DEVICE)
Bilirubin Urine: NEGATIVE
Glucose, UA: 100 — AB
Specific Gravity, Urine: 1.015
pH: 7.5

## 2010-10-05 ENCOUNTER — Telehealth: Payer: Self-pay | Admitting: *Deleted

## 2010-10-05 DIAGNOSIS — D72829 Elevated white blood cell count, unspecified: Secondary | ICD-10-CM

## 2010-10-05 DIAGNOSIS — M25519 Pain in unspecified shoulder: Secondary | ICD-10-CM

## 2010-10-05 LAB — COMPREHENSIVE METABOLIC PANEL
ALT: 26
AST: 24
Albumin: 3.4 — ABNORMAL LOW
Alkaline Phosphatase: 98
BUN: 9
GFR calc Af Amer: >60
Potassium: 3.5
Sodium: 137
Total Protein: 6.8

## 2010-10-05 LAB — CBC
HCT: 37.8
Hemoglobin: 12.7
MCV: 90
Platelets: 288
Platelets: 314
RDW: 13.8
WBC: 13 — ABNORMAL HIGH
WBC: 21.6 — ABNORMAL HIGH

## 2010-10-05 LAB — TYPE AND SCREEN
ABO/RH(D): A POS
Antibody Screen: NEGATIVE

## 2010-10-05 LAB — DIFFERENTIAL
Basophils Relative: 0
Eosinophils Relative: 0
Monocytes Absolute: 1.2 — ABNORMAL HIGH
Monocytes Relative: 6
Neutro Abs: 18.7 — ABNORMAL HIGH

## 2010-10-05 LAB — BASIC METABOLIC PANEL
BUN: 3 — ABNORMAL LOW
Chloride: 103
GFR calc Af Amer: >60
GFR calc non Af Amer: >60
Potassium: 3.7

## 2010-10-05 LAB — APTT: aPTT: 26

## 2010-10-05 LAB — HEMOGLOBIN AND HEMATOCRIT, BLOOD: HCT: 37.8

## 2010-10-05 NOTE — Telephone Encounter (Signed)
Pt called stating she had appt with Dr. August Saucer yesterday and the test she had done showed no osteo/infection in shoulder. She is asking what needs to be done next to find out where pain is coming from. Please advise

## 2010-10-05 NOTE — Telephone Encounter (Signed)
I would recheck CBC, ESR first Thx

## 2010-10-05 NOTE — Telephone Encounter (Signed)
It could be a c spine. Will need to recheck her. C spine xray - OV w/results Thx

## 2010-10-05 NOTE — Telephone Encounter (Signed)
Pt states she is not concerned with pain. She is more concerned with finding out why WBC is elevated and where the infection is. Please advise.

## 2010-10-08 NOTE — Telephone Encounter (Signed)
Patient informed, Lab ordered

## 2010-10-09 ENCOUNTER — Other Ambulatory Visit (INDEPENDENT_AMBULATORY_CARE_PROVIDER_SITE_OTHER): Payer: Medicare Other

## 2010-10-09 DIAGNOSIS — D72829 Elevated white blood cell count, unspecified: Secondary | ICD-10-CM

## 2010-10-09 DIAGNOSIS — M25519 Pain in unspecified shoulder: Secondary | ICD-10-CM

## 2010-10-09 LAB — CBC WITH DIFFERENTIAL/PLATELET
Basophils Absolute: 0 10*3/uL (ref 0.0–0.1)
Basophils Relative: 0.5 % (ref 0.0–3.0)
Eosinophils Absolute: 0.1 10*3/uL (ref 0.0–0.7)
Hemoglobin: 14.4 g/dL (ref 12.0–15.0)
Lymphs Abs: 2 10*3/uL (ref 0.7–4.0)
MCHC: 33 g/dL (ref 30.0–36.0)
MCV: 89.8 fl (ref 78.0–100.0)
Monocytes Absolute: 0.5 10*3/uL (ref 0.1–1.0)
Neutro Abs: 7.8 10*3/uL — ABNORMAL HIGH (ref 1.4–7.7)
RBC: 4.84 Mil/uL (ref 3.87–5.11)
RDW: 13.9 % (ref 11.5–14.6)

## 2010-10-12 ENCOUNTER — Telehealth: Payer: Self-pay | Admitting: *Deleted

## 2010-10-12 NOTE — Telephone Encounter (Signed)
Patient requesting results of labs.  

## 2010-10-12 NOTE — Telephone Encounter (Signed)
Pt called again, she is worried about results. I advised her that WBC is normal. Please advise regarding sed rate.

## 2010-10-12 NOTE — Telephone Encounter (Signed)
ESR is about the same. If Ortho ruled out infection, I do not see what else I can add. Would she like to see an ID specialist? Thx

## 2010-10-15 NOTE — Telephone Encounter (Signed)
Pt informed. She does not feel it is necessary to see ID specialist. She will f/u as scheduled in November.

## 2010-11-07 ENCOUNTER — Telehealth: Payer: Self-pay | Admitting: *Deleted

## 2010-11-07 MED ORDER — ALPRAZOLAM 1 MG PO TABS: 1.0000 mg | ORAL_TABLET | Freq: Three times a day (TID) | ORAL | Status: DC

## 2010-11-07 NOTE — Telephone Encounter (Signed)
OK to fill this prescription with additional refills x1 Thank you!  

## 2010-11-07 NOTE — Telephone Encounter (Signed)
Rf req for Alprazolam 1 mg 1 po tid # 90. Ok to Rf?

## 2010-12-03 ENCOUNTER — Telehealth: Payer: Self-pay | Admitting: *Deleted

## 2010-12-03 DIAGNOSIS — R61 Generalized hyperhidrosis: Secondary | ICD-10-CM

## 2010-12-03 NOTE — Telephone Encounter (Signed)
CBC, ESR, CMET Thx

## 2010-12-03 NOTE — Telephone Encounter (Signed)
Request to have labs done prior to OV Friday, 11.30.12 due to abnormal results in past.

## 2010-12-05 NOTE — Telephone Encounter (Signed)
Pt advised via VM, labs entered.

## 2010-12-06 ENCOUNTER — Other Ambulatory Visit (INDEPENDENT_AMBULATORY_CARE_PROVIDER_SITE_OTHER): Payer: BC Managed Care – PPO

## 2010-12-06 DIAGNOSIS — R61 Generalized hyperhidrosis: Secondary | ICD-10-CM

## 2010-12-06 LAB — COMPREHENSIVE METABOLIC PANEL
ALT: 23 U/L (ref 0–35)
AST: 17 U/L (ref 0–37)
Albumin: 3.4 g/dL — ABNORMAL LOW (ref 3.5–5.2)
Alkaline Phosphatase: 90 U/L (ref 39–117)
BUN: 10 mg/dL (ref 6–23)
Potassium: 4.6 mEq/L (ref 3.5–5.1)
Sodium: 142 mEq/L (ref 135–145)

## 2010-12-06 LAB — CBC WITH DIFFERENTIAL/PLATELET
Basophils Absolute: 0 10*3/uL (ref 0.0–0.1)
Eosinophils Absolute: 0.1 10*3/uL (ref 0.0–0.7)
Lymphocytes Relative: 22.5 % (ref 12.0–46.0)
MCHC: 33.7 g/dL (ref 30.0–36.0)
Monocytes Relative: 6.3 % (ref 3.0–12.0)
Neutro Abs: 7.2 10*3/uL (ref 1.4–7.7)
Neutrophils Relative %: 70.1 % (ref 43.0–77.0)
Platelets: 279 10*3/uL (ref 150.0–400.0)
RDW: 13.7 % (ref 11.5–14.6)

## 2010-12-06 LAB — SEDIMENTATION RATE: Sed Rate: 48 mm/hr — ABNORMAL HIGH (ref 0–22)

## 2010-12-07 ENCOUNTER — Ambulatory Visit (HOSPITAL_COMMUNITY)
Admission: RE | Admit: 2010-12-07 | Discharge: 2010-12-07 | Disposition: A | Discharge status: Home / Self Care | Payer: BC Managed Care – PPO | Source: Ambulatory Visit | Attending: Internal Medicine | Admitting: Internal Medicine

## 2010-12-07 ENCOUNTER — Encounter: Payer: Self-pay | Admitting: Internal Medicine

## 2010-12-07 ENCOUNTER — Other Ambulatory Visit (INDEPENDENT_AMBULATORY_CARE_PROVIDER_SITE_OTHER): Payer: BC Managed Care – PPO

## 2010-12-07 ENCOUNTER — Telehealth: Payer: Self-pay | Admitting: Internal Medicine

## 2010-12-07 ENCOUNTER — Ambulatory Visit (INDEPENDENT_AMBULATORY_CARE_PROVIDER_SITE_OTHER): Payer: BC Managed Care – PPO | Admitting: Internal Medicine

## 2010-12-07 VITALS — BP 122/90 | Temp 98.5°F | Wt 394.0 lb

## 2010-12-07 DIAGNOSIS — I10 Essential (primary) hypertension: Secondary | ICD-10-CM

## 2010-12-07 DIAGNOSIS — Z23 Encounter for immunization: Secondary | ICD-10-CM

## 2010-12-07 DIAGNOSIS — R1012 Left upper quadrant pain: Secondary | ICD-10-CM

## 2010-12-07 DIAGNOSIS — R61 Generalized hyperhidrosis: Secondary | ICD-10-CM

## 2010-12-07 DIAGNOSIS — F329 Major depressive disorder, single episode, unspecified: Secondary | ICD-10-CM

## 2010-12-07 LAB — URINALYSIS
Hgb urine dipstick: NEGATIVE
Ketones, ur: NEGATIVE
Total Protein, Urine: NEGATIVE
Urine Glucose: NEGATIVE

## 2010-12-07 MED ORDER — CIPROFLOXACIN HCL 500 MG PO TABS: 500.0000 mg | ORAL_TABLET | Freq: Two times a day (BID) | ORAL | Status: AC

## 2010-12-07 NOTE — Telephone Encounter (Signed)
Informed patient that her UA and xrays were normal.

## 2010-12-07 NOTE — Patient Instructions (Signed)
Take Ibuprofen 600 mg 3 times a day x 1 wk

## 2010-12-07 NOTE — Progress Notes (Signed)
  Subjective:    Patient ID: Stephanie Harper, female    DOB: 17-Jun-1970, 40 y.o.   MRN: [id number]  HPI   The patient is here to follow up on chronic R sh pain, depression, anxiety, headaches and chronic moderate fibromyalgia symptoms controlled with medicines, diet and exercise.   C/o severe at times L CP/LUQ abd pain x 2 wks off and on - not related to meals or activity. May be a little worse w/deap breaths   Review of Systems  Constitutional: Negative.  Negative for fever, chills, diaphoresis, activity change, appetite change, fatigue and unexpected weight change.  HENT: Negative for hearing loss, ear pain, nosebleeds, congestion, sore throat, facial swelling, rhinorrhea, sneezing, mouth sores, trouble swallowing, neck pain, neck stiffness, postnasal drip, sinus pressure and tinnitus.   Eyes: Negative for pain, discharge, redness, itching and visual disturbance.  Respiratory: Negative for cough, chest tightness, shortness of breath, wheezing and stridor.   Cardiovascular: Positive for chest pain. Negative for palpitations and leg swelling.  Gastrointestinal: Positive for abdominal pain. Negative for nausea, diarrhea, constipation, blood in stool, abdominal distention, anal bleeding and rectal pain.  Genitourinary: Negative for dysuria, urgency, frequency, hematuria, flank pain, vaginal bleeding, vaginal discharge, difficulty urinating, genital sores and pelvic pain.  Musculoskeletal: Positive for back pain. Negative for joint swelling, arthralgias and gait problem.  Skin: Negative.  Negative for rash.  Neurological: Negative for dizziness, tremors, seizures, syncope, speech difficulty, weakness, numbness and headaches.  Hematological: Negative for adenopathy. Does not bruise/bleed easily.  Psychiatric/Behavioral: Negative for suicidal ideas, behavioral problems, sleep disturbance, dysphoric mood and decreased concentration. The patient is not nervous/anxious.        Objective:   Physical Exam  Constitutional: She appears well-developed. No distress.       Obese  HENT:  Head: Normocephalic.  Right Ear: External ear normal.  Left Ear: External ear normal.  Nose: Nose normal.  Mouth/Throat: Oropharynx is clear and moist.  Eyes: Conjunctivae are normal. Pupils are equal, round, and reactive to light. Right eye exhibits no discharge. Left eye exhibits no discharge.  Neck: Normal range of motion. Neck supple. No JVD present. No tracheal deviation present. No thyromegaly present.  Cardiovascular: Normal rate, regular rhythm and normal heart sounds.   Pulmonary/Chest: No stridor. No respiratory distress. She has no wheezes.  Abdominal: Soft. Bowel sounds are normal. She exhibits no distension and no mass. There is tenderness (tender to palp over L lower anterolat ribs). There is no rebound and no guarding.  Musculoskeletal: She exhibits no edema and no tenderness.  Lymphadenopathy:    She has no cervical adenopathy.  Neurological: She displays normal reflexes. No cranial nerve deficit. She exhibits normal muscle tone. Coordination normal.  Skin: No rash noted. No erythema.  Psychiatric: She has a normal mood and affect. Her behavior is normal. Judgment and thought content normal.          Assessment & Plan:

## 2010-12-07 NOTE — Telephone Encounter (Signed)
Stephanie Harper, please, inform patient that her UA and xrays were nl Thx

## 2010-12-09 NOTE — Assessment & Plan Note (Signed)
Will watch 

## 2010-12-09 NOTE — Assessment & Plan Note (Signed)
KUB Labs (UA) See Meds

## 2010-12-09 NOTE — Assessment & Plan Note (Signed)
Continue with current prescription therapy as reflected on the Med list.  

## 2010-12-13 ENCOUNTER — Other Ambulatory Visit: Payer: Self-pay | Admitting: *Deleted

## 2010-12-13 MED ORDER — ALBUTEROL SULFATE HFA 108 (90 BASE) MCG/ACT IN AERS: 2.0000 | INHALATION_SPRAY | Freq: Four times a day (QID) | RESPIRATORY_TRACT | Status: DC | PRN

## 2010-12-14 ENCOUNTER — Telehealth: Payer: Self-pay | Admitting: *Deleted

## 2010-12-14 MED ORDER — HYDROCODONE-ACETAMINOPHEN 5-500 MG PO TABS: 1.0000 | ORAL_TABLET | Freq: Four times a day (QID) | ORAL | Status: DC | PRN

## 2010-12-14 NOTE — Telephone Encounter (Signed)
OK to fill this prescription with additional refills x0 Thank you!  

## 2010-12-14 NOTE — Telephone Encounter (Signed)
Refill left on pharmacy voicemail #[phone] x no refills.

## 2010-12-14 NOTE — Telephone Encounter (Signed)
Rf req for Hydroco/APAP 5-500 mg 1 po qid #120. Last filled 10.23.12. Ok to Rf?

## 2011-01-17 ENCOUNTER — Telehealth: Payer: Self-pay

## 2011-01-17 MED ORDER — VALSARTAN 160 MG PO TABS: 160.0000 mg | ORAL_TABLET | Freq: Every day | ORAL | Status: DC

## 2011-01-17 NOTE — Telephone Encounter (Signed)
Pt called requesting Rx for Diovan be changed to generic. Rx changed.

## 2011-01-21 ENCOUNTER — Telehealth: Payer: Self-pay | Admitting: *Deleted

## 2011-01-21 MED ORDER — ALPRAZOLAM 1 MG PO TABS: 1.0000 mg | ORAL_TABLET | Freq: Three times a day (TID) | ORAL | Status: DC

## 2011-01-21 MED ORDER — HYDROCODONE-ACETAMINOPHEN 5-500 MG PO TABS: 1.0000 | ORAL_TABLET | Freq: Four times a day (QID) | ORAL | Status: DC | PRN

## 2011-01-21 NOTE — Telephone Encounter (Signed)
OK to fill this prescription with additional refills x2 for both Thank you!

## 2011-01-21 NOTE — Telephone Encounter (Signed)
Rf req for xanax 1 mg 1 po tid and Vicodin 5/500mg  1 po qid. Ok to Rf both?

## 2011-01-23 ENCOUNTER — Telehealth: Payer: Self-pay | Admitting: *Deleted

## 2011-01-23 NOTE — Telephone Encounter (Signed)
Pt reports Insurance will not cover Diovan; request alternative medication.

## 2011-01-24 MED ORDER — LOSARTAN POTASSIUM 100 MG PO TABS: 100.0000 mg | ORAL_TABLET | Freq: Every day | ORAL | Status: DC

## 2011-01-24 NOTE — Telephone Encounter (Signed)
Pt advised of new Rx/pharmacy 

## 2011-01-24 NOTE — Telephone Encounter (Signed)
Losartan 1 a day Thx

## 2011-02-20 ENCOUNTER — Telehealth: Payer: Self-pay | Admitting: *Deleted

## 2011-02-20 MED ORDER — LOSARTAN POTASSIUM 100 MG PO TABS: 100.0000 mg | ORAL_TABLET | Freq: Every day | ORAL | Status: DC

## 2011-02-20 MED ORDER — PROMETHAZINE-CODEINE 6.25-10 MG/5ML PO SYRP: 5.0000 mL | ORAL_SOLUTION | ORAL | Status: DC | PRN

## 2011-02-20 NOTE — Telephone Encounter (Signed)
OK prom-cod - pls call in [place] Losartan Thx

## 2011-02-20 NOTE — Telephone Encounter (Signed)
Pt called for refill of Losartan (states pharmacy did not have rx previously sent) and pt is requesting refill of cough syrup-has had nagging non-productive cough for 2 weeks and OTC cough meds (Nyquil, Robutussin) not helping. Please advise on cough syrup.

## 2011-02-21 MED ORDER — PROMETHAZINE-CODEINE 6.25-10 MG/5ML PO SYRP: 5.0000 mL | ORAL_SOLUTION | ORAL | Status: AC | PRN

## 2011-02-21 NOTE — Telephone Encounter (Signed)
Rx for Phnergan/Codeine cough syrup called in, pt informed.

## 2011-03-12 ENCOUNTER — Ambulatory Visit: Payer: Medicare Other | Admitting: Internal Medicine

## 2011-03-26 ENCOUNTER — Other Ambulatory Visit: Payer: Self-pay | Admitting: Obstetrics and Gynecology

## 2011-03-26 DIAGNOSIS — Z1231 Encounter for screening mammogram for malignant neoplasm of breast: Secondary | ICD-10-CM

## 2011-04-02 ENCOUNTER — Ambulatory Visit (INDEPENDENT_AMBULATORY_CARE_PROVIDER_SITE_OTHER): Payer: BC Managed Care – PPO | Admitting: Internal Medicine

## 2011-04-02 ENCOUNTER — Encounter: Payer: Self-pay | Admitting: Internal Medicine

## 2011-04-02 VITALS — BP 120/78 | HR 92 | Temp 97.5°F | Resp 16 | Wt >= 6400 oz

## 2011-04-02 DIAGNOSIS — M545 Low back pain, unspecified: Secondary | ICD-10-CM | POA: Diagnosis not present

## 2011-04-02 DIAGNOSIS — F410 Panic disorder [episodic paroxysmal anxiety] without agoraphobia: Secondary | ICD-10-CM

## 2011-04-02 DIAGNOSIS — M25519 Pain in unspecified shoulder: Secondary | ICD-10-CM

## 2011-04-02 DIAGNOSIS — F329 Major depressive disorder, single episode, unspecified: Secondary | ICD-10-CM

## 2011-04-02 DIAGNOSIS — I10 Essential (primary) hypertension: Secondary | ICD-10-CM

## 2011-04-02 MED ORDER — HYDROCODONE-ACETAMINOPHEN 5-500 MG PO TABS: 1.0000 | ORAL_TABLET | Freq: Four times a day (QID) | ORAL | Status: DC | PRN

## 2011-04-02 MED ORDER — BUPROPION HCL ER (SR) 100 MG PO TB12: 100.0000 mg | ORAL_TABLET | Freq: Two times a day (BID) | ORAL | Status: DC

## 2011-04-02 MED ORDER — AMOXICILLIN 500 MG PO CAPS: ORAL_CAPSULE | ORAL | Status: DC

## 2011-04-02 MED ORDER — LOSARTAN POTASSIUM 100 MG PO TABS: 100.0000 mg | ORAL_TABLET | Freq: Every day | ORAL | Status: DC

## 2011-04-02 MED ORDER — IBUPROFEN 600 MG PO TABS: 600.0000 mg | ORAL_TABLET | Freq: Two times a day (BID) | ORAL | Status: DC | PRN

## 2011-04-02 MED ORDER — ALPRAZOLAM 1 MG PO TABS: 1.0000 mg | ORAL_TABLET | Freq: Three times a day (TID) | ORAL | Status: DC | PRN

## 2011-04-02 MED ORDER — PAROXETINE HCL 10 MG PO TABS: 10.0000 mg | ORAL_TABLET | Freq: Every day | ORAL | Status: DC

## 2011-04-02 NOTE — Assessment & Plan Note (Signed)
Gen Surg cons - re: Lap Band

## 2011-04-02 NOTE — Assessment & Plan Note (Signed)
Continue with current prescription therapy as reflected on the Med list.  

## 2011-04-02 NOTE — Progress Notes (Signed)
Patient ID: Stephanie Harper, female   DOB: March 12, 1970, 41 y.o.   MRN: [id number]  Subjective:    Patient ID: Stephanie Harper, female    DOB: 08-Dec-1970, 41 y.o.   MRN: [id number]  HPI   The patient is here to follow up on chronic R sh pain, depression, anxiety, headaches and chronic moderate fibromyalgia symptoms controlled with medicines, diet and exercise.  F/u severe at times L CP/LUQ abd pain--resolved  Review of Systems  Constitutional: Negative.  Negative for fever, chills, diaphoresis, activity change, appetite change, fatigue and unexpected weight change.  HENT: Negative for hearing loss, ear pain, nosebleeds, congestion, sore throat, facial swelling, rhinorrhea, sneezing, mouth sores, trouble swallowing, neck pain, neck stiffness, postnasal drip, sinus pressure and tinnitus.   Eyes: Negative for pain, discharge, redness, itching and visual disturbance.  Respiratory: Negative for cough, chest tightness, shortness of breath, wheezing and stridor.   Cardiovascular: Positive for chest pain. Negative for palpitations and leg swelling.  Gastrointestinal: Positive for abdominal pain. Negative for nausea, diarrhea, constipation, blood in stool, abdominal distention, anal bleeding and rectal pain.  Genitourinary: Negative for dysuria, urgency, frequency, hematuria, flank pain, vaginal bleeding, vaginal discharge, difficulty urinating, genital sores and pelvic pain.  Musculoskeletal: Positive for back pain. Negative for joint swelling, arthralgias and gait problem.  Skin: Negative.  Negative for rash.  Neurological: Negative for dizziness, tremors, seizures, syncope, speech difficulty, weakness, numbness and headaches.  Hematological: Negative for adenopathy. Does not bruise/bleed easily.  Psychiatric/Behavioral: Negative for suicidal ideas, behavioral problems, sleep disturbance, dysphoric mood and decreased concentration. The patient is not nervous/anxious.    Wt Readings from Last 3  Encounters:  04/02/11 406 lb (184.16 kg)  12/07/10 394 lb (178.717 kg)  08/31/10 386 lb (175.088 kg)   BP Readings from Last 3 Encounters:  04/02/11 120/78  12/07/10 122/90  08/31/10 110/76         Objective:   Physical Exam  Constitutional: She appears well-developed. No distress.       Obese  HENT:  Head: Normocephalic.  Right Ear: External ear normal.  Left Ear: External ear normal.  Nose: Nose normal.  Mouth/Throat: Oropharynx is clear and moist.  Eyes: Conjunctivae are normal. Pupils are equal, round, and reactive to light. Right eye exhibits no discharge. Left eye exhibits no discharge.  Neck: Normal range of motion. Neck supple. No JVD present. No tracheal deviation present. No thyromegaly present.  Cardiovascular: Normal rate, regular rhythm and normal heart sounds.   Pulmonary/Chest: No stridor. No respiratory distress. She has no wheezes.  Abdominal: Soft. Bowel sounds are normal. She exhibits no distension and no mass. There is tenderness (tender to palp over L lower anterolat ribs). There is no rebound and no guarding.  Musculoskeletal: She exhibits no edema and no tenderness.  Lymphadenopathy:    She has no cervical adenopathy.  Neurological: She displays normal reflexes. No cranial nerve deficit. She exhibits normal muscle tone. Coordination normal.  Skin: No rash noted. No erythema.  Psychiatric: She has a normal mood and affect. Her behavior is normal. Judgment and thought content normal.   Lab Results  Component Value Date   WBC 10.2 12/06/2010   HGB 13.7 12/06/2010   HCT 40.6 12/06/2010   PLT 279.0 12/06/2010   GLUCOSE 113* 12/06/2010   CHOL 175 01/29/2010   TRIG 97.0 01/29/2010   HDL 36.30* 01/29/2010   LDLCALC 119* 01/29/2010   ALT 23 12/06/2010   AST 17 12/06/2010   NA 142 12/06/2010   K 4.6 12/06/2010  CL 106 12/06/2010   CREATININE 0.7 12/06/2010   BUN 10 12/06/2010   CO2 30 12/06/2010   TSH 2.07 06/05/2010   INR 1.0 08/09/2007   HGBA1C 5.7  11/05/2007          Assessment & Plan:

## 2011-04-04 ENCOUNTER — Ambulatory Visit
Admission: RE | Admit: 2011-04-04 | Discharge: 2011-04-04 | Disposition: A | Discharge status: Home / Self Care | Payer: BC Managed Care – PPO | Source: Ambulatory Visit | Attending: Obstetrics and Gynecology | Admitting: Obstetrics and Gynecology

## 2011-04-04 DIAGNOSIS — Z1231 Encounter for screening mammogram for malignant neoplasm of breast: Secondary | ICD-10-CM

## 2011-04-15 ENCOUNTER — Other Ambulatory Visit: Payer: Self-pay | Admitting: *Deleted

## 2011-04-15 MED ORDER — IBUPROFEN 600 MG PO TABS: 600.0000 mg | ORAL_TABLET | Freq: Two times a day (BID) | ORAL | Status: DC | PRN

## 2011-04-17 ENCOUNTER — Other Ambulatory Visit: Payer: Self-pay | Admitting: *Deleted

## 2011-04-17 MED ORDER — BUPROPION HCL ER (SR) 100 MG PO TB12: 100.0000 mg | ORAL_TABLET | Freq: Two times a day (BID) | ORAL | Status: DC

## 2011-05-09 ENCOUNTER — Ambulatory Visit (INDEPENDENT_AMBULATORY_CARE_PROVIDER_SITE_OTHER): Payer: BC Managed Care – PPO | Admitting: Internal Medicine

## 2011-05-09 ENCOUNTER — Encounter: Payer: Self-pay | Admitting: Internal Medicine

## 2011-05-09 VITALS — BP 120/68 | HR 78 | Temp 98.3°F | Ht 66.0 in | Wt >= 6400 oz

## 2011-05-09 DIAGNOSIS — J45909 Unspecified asthma, uncomplicated: Secondary | ICD-10-CM | POA: Diagnosis not present

## 2011-05-09 NOTE — Patient Instructions (Signed)
Wt Readings from Last 3 Encounters:  05/09/11 402 lb 12.8 oz (182.709 kg)  04/02/11 406 lb (184.16 kg)  12/07/10 394 lb (178.717 kg)

## 2011-05-14 NOTE — Assessment & Plan Note (Signed)
Discussed Letter Form

## 2011-05-14 NOTE — Progress Notes (Signed)
Patient ID: Stephanie Harper, female   DOB: 1970/03/23, 41 y.o.   MRN: [id number] Patient ID: Stephanie Harper, female   DOB: [birth year], 41 y.o.   MRN: [id number]  Subjective:    Patient ID: Stephanie Harper, female    DOB: 12-May-1970, 41 y.o.   MRN: [id number]  HPI   The patient is here to follow up on chronic R sh pain, depression, anxiety, headaches and chronic moderate fibromyalgia symptoms controlled with medicines, diet and exercise.  F/u severe obesity  Review of Systems  Constitutional: Negative.  Negative for fever, chills, diaphoresis, activity change, appetite change, fatigue and unexpected weight change.  HENT: Negative for hearing loss, ear pain, nosebleeds, congestion, sore throat, facial swelling, rhinorrhea, sneezing, mouth sores, trouble swallowing, neck pain, neck stiffness, postnasal drip, sinus pressure and tinnitus.   Eyes: Negative for pain, discharge, redness, itching and visual disturbance.  Respiratory: Negative for cough, chest tightness, shortness of breath, wheezing and stridor.   Cardiovascular: Negative for chest pain, palpitations and leg swelling.  Gastrointestinal: Negative for nausea, abdominal pain, diarrhea, constipation, blood in stool, abdominal distention, anal bleeding and rectal pain.  Genitourinary: Negative for dysuria, urgency, frequency, hematuria, flank pain, vaginal bleeding, vaginal discharge, difficulty urinating, genital sores and pelvic pain.  Musculoskeletal: Positive for back pain. Negative for joint swelling, arthralgias and gait problem.  Skin: Negative.  Negative for rash.  Neurological: Negative for dizziness, tremors, seizures, syncope, speech difficulty, weakness, numbness and headaches.  Hematological: Negative for adenopathy. Does not bruise/bleed easily.  Psychiatric/Behavioral: Negative for suicidal ideas, behavioral problems, sleep disturbance, dysphoric mood and decreased concentration. The patient is not nervous/anxious.    Wt  Readings from Last 3 Encounters:  05/09/11 402 lb 12.8 oz (182.709 kg)  04/02/11 406 lb (184.16 kg)  12/07/10 394 lb (178.717 kg)   BP Readings from Last 3 Encounters:  05/09/11 120/68  04/02/11 120/78  12/07/10 122/90         Objective:   Physical Exam  Constitutional: She appears well-developed. No distress.       Obese  HENT:  Head: Normocephalic.  Right Ear: External ear normal.  Left Ear: External ear normal.  Nose: Nose normal.  Mouth/Throat: Oropharynx is clear and moist.  Eyes: Conjunctivae are normal. Pupils are equal, round, and reactive to light. Right eye exhibits no discharge. Left eye exhibits no discharge.  Neck: Normal range of motion. Neck supple. No JVD present. No tracheal deviation present. No thyromegaly present.  Cardiovascular: Normal rate, regular rhythm and normal heart sounds.   Pulmonary/Chest: No stridor. No respiratory distress. She has no wheezes.  Abdominal: Soft. Bowel sounds are normal. She exhibits no distension and no mass. There is no tenderness. There is no rebound and no guarding.  Musculoskeletal: She exhibits no edema and no tenderness.  Lymphadenopathy:    She has no cervical adenopathy.  Neurological: She displays normal reflexes. No cranial nerve deficit. She exhibits normal muscle tone. Coordination normal.  Skin: No rash noted. No erythema.  Psychiatric: She has a normal mood and affect. Her behavior is normal. Judgment and thought content normal.   Lab Results  Component Value Date   WBC 10.2 12/06/2010   HGB 13.7 12/06/2010   HCT 40.6 12/06/2010   PLT 279.0 12/06/2010   GLUCOSE 113* 12/06/2010   CHOL 175 01/29/2010   TRIG 97.0 01/29/2010   HDL 36.30* 01/29/2010   LDLCALC 119* 01/29/2010   ALT 23 12/06/2010   AST 17 12/06/2010   NA 142 12/06/2010   K 4.6  12/06/2010   CL 106 12/06/2010   CREATININE 0.7 12/06/2010   BUN 10 12/06/2010   CO2 30 12/06/2010   TSH 2.07 06/05/2010   INR 1.0 08/09/2007   HGBA1C 5.7 11/05/2007           Assessment & Plan:

## 2011-05-14 NOTE — Assessment & Plan Note (Signed)
Rx prn 

## 2011-05-21 ENCOUNTER — Other Ambulatory Visit: Payer: Self-pay | Admitting: Orthopedic Surgery

## 2011-05-21 DIAGNOSIS — M25569 Pain in unspecified knee: Secondary | ICD-10-CM

## 2011-05-26 ENCOUNTER — Ambulatory Visit
Admission: RE | Admit: 2011-05-26 | Discharge: 2011-05-26 | Disposition: A | Discharge status: Home / Self Care | Payer: BC Managed Care – PPO | Source: Ambulatory Visit | Attending: Orthopedic Surgery | Admitting: Orthopedic Surgery

## 2011-05-26 DIAGNOSIS — M25569 Pain in unspecified knee: Secondary | ICD-10-CM | POA: Diagnosis not present

## 2011-05-26 DIAGNOSIS — M25469 Effusion, unspecified knee: Secondary | ICD-10-CM | POA: Diagnosis not present

## 2011-05-26 DIAGNOSIS — IMO0002 Reserved for concepts with insufficient information to code with codable children: Secondary | ICD-10-CM | POA: Diagnosis not present

## 2011-05-26 DIAGNOSIS — M1710 Unilateral primary osteoarthritis, unspecified knee: Secondary | ICD-10-CM | POA: Diagnosis not present

## 2011-05-30 ENCOUNTER — Other Ambulatory Visit: Payer: Self-pay | Admitting: Orthopedic Surgery

## 2011-05-30 DIAGNOSIS — R531 Weakness: Secondary | ICD-10-CM

## 2011-05-30 DIAGNOSIS — M79652 Pain in left thigh: Secondary | ICD-10-CM

## 2011-06-02 ENCOUNTER — Other Ambulatory Visit: Payer: Self-pay | Admitting: Orthopedic Surgery

## 2011-06-02 ENCOUNTER — Ambulatory Visit
Admission: RE | Admit: 2011-06-02 | Discharge: 2011-06-02 | Disposition: A | Discharge status: Home / Self Care | Payer: BC Managed Care – PPO | Source: Ambulatory Visit | Attending: Orthopedic Surgery | Admitting: Orthopedic Surgery

## 2011-06-02 DIAGNOSIS — M79652 Pain in left thigh: Secondary | ICD-10-CM

## 2011-06-02 DIAGNOSIS — R531 Weakness: Secondary | ICD-10-CM

## 2011-06-02 DIAGNOSIS — R609 Edema, unspecified: Secondary | ICD-10-CM | POA: Diagnosis not present

## 2011-06-02 DIAGNOSIS — I83893 Varicose veins of bilateral lower extremities with other complications: Secondary | ICD-10-CM | POA: Diagnosis not present

## 2011-06-02 DIAGNOSIS — M79609 Pain in unspecified limb: Secondary | ICD-10-CM | POA: Diagnosis not present

## 2011-06-05 DIAGNOSIS — M25569 Pain in unspecified knee: Secondary | ICD-10-CM | POA: Diagnosis not present

## 2011-06-05 DIAGNOSIS — M25519 Pain in unspecified shoulder: Secondary | ICD-10-CM | POA: Diagnosis not present

## 2011-06-21 ENCOUNTER — Ambulatory Visit (INDEPENDENT_AMBULATORY_CARE_PROVIDER_SITE_OTHER): Payer: BC Managed Care – PPO | Admitting: Surgery

## 2011-06-21 ENCOUNTER — Encounter (INDEPENDENT_AMBULATORY_CARE_PROVIDER_SITE_OTHER): Payer: Self-pay | Admitting: Surgery

## 2011-06-21 ENCOUNTER — Other Ambulatory Visit (INDEPENDENT_AMBULATORY_CARE_PROVIDER_SITE_OTHER): Payer: Self-pay | Admitting: General Surgery

## 2011-06-21 NOTE — Patient Instructions (Signed)
Begin lapband workup per Leandrew Koyanagi
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
## 2011-06-25 ENCOUNTER — Other Ambulatory Visit (INDEPENDENT_AMBULATORY_CARE_PROVIDER_SITE_OTHER): Payer: Self-pay | Admitting: Surgery

## 2011-06-26 ENCOUNTER — Other Ambulatory Visit: Payer: Self-pay

## 2011-06-26 ENCOUNTER — Ambulatory Visit (HOSPITAL_COMMUNITY)
Admission: RE | Admit: 2011-06-26 | Discharge: 2011-06-26 | Disposition: A | Discharge status: Home / Self Care | Payer: BC Managed Care – PPO | Source: Ambulatory Visit | Attending: Surgery | Admitting: Surgery

## 2011-06-26 DIAGNOSIS — M545 Low back pain, unspecified: Secondary | ICD-10-CM | POA: Insufficient documentation

## 2011-06-26 DIAGNOSIS — R61 Generalized hyperhidrosis: Secondary | ICD-10-CM | POA: Insufficient documentation

## 2011-06-26 DIAGNOSIS — Z6841 Body Mass Index (BMI) 40.0 and over, adult: Secondary | ICD-10-CM | POA: Insufficient documentation

## 2011-06-26 DIAGNOSIS — F3289 Other specified depressive episodes: Secondary | ICD-10-CM | POA: Insufficient documentation

## 2011-06-26 DIAGNOSIS — R918 Other nonspecific abnormal finding of lung field: Secondary | ICD-10-CM | POA: Diagnosis not present

## 2011-06-26 DIAGNOSIS — Z87891 Personal history of nicotine dependence: Secondary | ICD-10-CM | POA: Insufficient documentation

## 2011-06-26 DIAGNOSIS — I10 Essential (primary) hypertension: Secondary | ICD-10-CM | POA: Insufficient documentation

## 2011-06-26 DIAGNOSIS — Z01811 Encounter for preprocedural respiratory examination: Secondary | ICD-10-CM | POA: Diagnosis not present

## 2011-06-26 DIAGNOSIS — F329 Major depressive disorder, single episode, unspecified: Secondary | ICD-10-CM | POA: Insufficient documentation

## 2011-06-26 LAB — COMPREHENSIVE METABOLIC PANEL
ALT: 13 U/L (ref 0–35)
Alkaline Phosphatase: 93 U/L (ref 39–117)
CO2: 28 mEq/L (ref 19–32)
Creat: 0.58 mg/dL (ref 0.50–1.10)
Glucose, Bld: 84 mg/dL (ref 70–99)
Sodium: 142 mEq/L (ref 135–145)
Total Bilirubin: 0.3 mg/dL (ref 0.3–1.2)
Total Protein: 6.8 g/dL (ref 6.0–8.3)

## 2011-06-26 LAB — CBC
MCH: 29 pg (ref 26.0–34.0)
MCHC: 33.5 g/dL (ref 30.0–36.0)
MCV: 86.6 fL (ref 78.0–100.0)
Platelets: 366 10*3/uL (ref 150–400)

## 2011-06-26 LAB — T4: T4, Total: 7.7 ug/dL (ref 5.0–12.5)

## 2011-06-26 LAB — TSH: TSH: 2.491 u[IU]/mL (ref 0.350–4.500)

## 2011-07-02 ENCOUNTER — Ambulatory Visit (HOSPITAL_COMMUNITY)
Admission: RE | Admit: 2011-07-02 | Discharge: 2011-07-02 | Disposition: A | Discharge status: Home / Self Care | Payer: BC Managed Care – PPO | Source: Ambulatory Visit | Attending: Surgery | Admitting: Surgery

## 2011-07-02 DIAGNOSIS — Z01818 Encounter for other preprocedural examination: Secondary | ICD-10-CM | POA: Insufficient documentation

## 2011-07-04 ENCOUNTER — Ambulatory Visit (INDEPENDENT_AMBULATORY_CARE_PROVIDER_SITE_OTHER): Payer: BC Managed Care – PPO | Admitting: Internal Medicine

## 2011-07-04 ENCOUNTER — Encounter: Payer: Self-pay | Admitting: Internal Medicine

## 2011-07-04 VITALS — BP 120/88 | HR 84 | Temp 98.6°F | Resp 16 | Wt 398.0 lb

## 2011-07-04 DIAGNOSIS — J45909 Unspecified asthma, uncomplicated: Secondary | ICD-10-CM

## 2011-07-04 DIAGNOSIS — I10 Essential (primary) hypertension: Secondary | ICD-10-CM

## 2011-07-04 DIAGNOSIS — M25529 Pain in unspecified elbow: Secondary | ICD-10-CM | POA: Diagnosis not present

## 2011-07-04 DIAGNOSIS — F411 Generalized anxiety disorder: Secondary | ICD-10-CM | POA: Diagnosis not present

## 2011-07-04 MED ORDER — AMOXICILLIN 500 MG PO CAPS: 1000.0000 mg | ORAL_CAPSULE | Freq: Two times a day (BID) | ORAL | Status: DC

## 2011-07-04 NOTE — Assessment & Plan Note (Signed)
Continue with current prescription therapy as reflected on the Med list.  

## 2011-07-04 NOTE — Progress Notes (Signed)
Subjective:    Patient ID: Stephanie Harper, female    DOB: 01/02/1971, 41 y.o.   MRN: [id number]  HPI   The patient is here to follow up on chronic R sh pain, depression, anxiety, headaches and chronic moderate fibromyalgia symptoms controlled with medicines, diet and exercise.  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  Negative for fever, chills, diaphoresis, activity change, appetite change, fatigue and unexpected weight change.  HENT: Negative for hearing loss, ear pain, nosebleeds, congestion, sore throat, facial swelling, rhinorrhea, sneezing, mouth sores, trouble swallowing, neck pain, neck stiffness, postnasal drip, sinus pressure and tinnitus.   Eyes: Negative for pain, discharge, redness, itching and visual disturbance.  Respiratory: Negative for cough, chest tightness, shortness of breath, wheezing and stridor.   Cardiovascular: Negative for chest pain, palpitations and leg swelling.  Gastrointestinal: Negative for nausea, abdominal pain, diarrhea, constipation, blood in stool, abdominal distention, anal bleeding and rectal pain.  Genitourinary: Negative for dysuria, urgency, frequency, hematuria, flank pain, vaginal bleeding, vaginal discharge, difficulty urinating, genital sores and pelvic pain.  Musculoskeletal: Positive for back pain. Negative for joint swelling, arthralgias and gait problem.  Skin: Negative.  Negative for rash.  Neurological: Negative for dizziness, tremors, seizures, syncope, speech difficulty, weakness, numbness and headaches.  Hematological: Negative for adenopathy. Does not bruise/bleed easily.  Psychiatric/Behavioral: Negative for suicidal ideas, behavioral problems, disturbed wake/sleep cycle, dysphoric mood and decreased concentration. The patient is not nervous/anxious.    Wt Readings from Last 3 Encounters:  07/04/11 398 lb (180.532 kg)  06/21/11 397  lb 9.6 oz (180.35 kg)  05/09/11 402 lb 12.8 oz (182.709 kg)   BP Readings from Last 3 Encounters:  07/04/11 120/88  06/21/11 112/68  05/09/11 120/68         Objective:   Physical Exam  Constitutional: She appears well-developed. No distress.       Obese  HENT:  Head: Normocephalic.  Right Ear: External ear normal.  Left Ear: External ear normal.  Nose: Nose normal.  Mouth/Throat: Oropharynx is clear and moist.  Eyes: Conjunctivae are normal. Pupils are equal, round, and reactive to light. Right eye exhibits no discharge. Left eye exhibits no discharge.  Neck: Normal range of motion. Neck supple. No JVD present. No tracheal deviation present. No thyromegaly present.  Cardiovascular: Normal rate, regular rhythm and normal heart sounds.   Pulmonary/Chest: No stridor. No respiratory distress. She has no wheezes.  Abdominal: Soft. Bowel sounds are normal. She exhibits no distension and no mass. There is no tenderness. There is no rebound and no guarding.  Musculoskeletal: She exhibits no edema and no tenderness.  Lymphadenopathy:    She has no cervical adenopathy.  Neurological: She displays normal reflexes. No cranial nerve deficit. She exhibits normal muscle tone. Coordination normal.  Skin: No rash noted. No erythema.  Psychiatric: She has a normal mood and affect. Her behavior is normal. Judgment and thought content normal.   Lab Results  Component Value Date   WBC 11.2* 06/25/2011   HGB 14.1 06/25/2011   HCT 42.1 06/25/2011   PLT 366 06/25/2011   GLUCOSE 84 06/25/2011   CHOL 175 01/29/2010   TRIG 97.0 01/29/2010   HDL 36.30* 01/29/2010   LDLCALC 119* 01/29/2010   ALT 13 06/25/2011   AST 12 06/25/2011   NA 142 06/25/2011   K 4.9 06/25/2011   CL 104 06/25/2011   CREATININE  0.58 06/25/2011   BUN 10 06/25/2011   CO2 28 06/25/2011   TSH 2.491 06/25/2011   INR 1.0 08/09/2007   HGBA1C 5.7 11/05/2007          Assessment & Plan:
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
## 2011-07-04 NOTE — Patient Instructions (Addendum)
Wt Readings from Last 3 Encounters:  07/04/11 398 lb (180.532 kg)  06/21/11 397 lb 9.6 oz (180.35 kg)  05/09/11 402 lb 12.8 oz (182.709 kg)    

## 2011-07-04 NOTE — Assessment & Plan Note (Signed)
Wt Readings from Last 3 Encounters:  07/04/11 398 lb (180.532 kg)  06/21/11 397 lb 9.6 oz (180.35 kg)  05/09/11 402 lb 12.8 oz (182.709 kg)

## 2011-07-15 ENCOUNTER — Encounter (HOSPITAL_COMMUNITY)
Admission: RE | Disposition: A | Discharge status: Home / Self Care | Payer: Self-pay | Source: Ambulatory Visit | Attending: Surgery

## 2011-07-15 ENCOUNTER — Ambulatory Visit (HOSPITAL_COMMUNITY)
Admission: RE | Admit: 2011-07-15 | Discharge: 2011-07-15 | Disposition: A | Discharge status: Home / Self Care | Payer: BC Managed Care – PPO | Source: Ambulatory Visit | Attending: Surgery | Admitting: Surgery

## 2011-07-15 DIAGNOSIS — Z01818 Encounter for other preprocedural examination: Secondary | ICD-10-CM | POA: Insufficient documentation

## 2011-07-15 HISTORY — PX: BREATH TEK H PYLORI: SHX5422

## 2011-07-15 SURGERY — BREATH TEST, FOR HELICOBACTER PYLORI

## 2011-07-16 ENCOUNTER — Encounter (HOSPITAL_COMMUNITY): Payer: Self-pay | Admitting: Surgery

## 2011-07-18 ENCOUNTER — Encounter: Payer: Self-pay | Admitting: *Deleted

## 2011-07-18 ENCOUNTER — Encounter: Payer: BC Managed Care – PPO | Attending: Surgery | Admitting: *Deleted

## 2011-07-18 DIAGNOSIS — Z01818 Encounter for other preprocedural examination: Secondary | ICD-10-CM | POA: Insufficient documentation

## 2011-07-18 DIAGNOSIS — Z713 Dietary counseling and surveillance: Secondary | ICD-10-CM | POA: Insufficient documentation

## 2011-07-18 NOTE — Patient Instructions (Addendum)
   Follow Pre-Op Nutrition Goals to prepare for Lap Band Surgery.   Call the [facility] at [phone] once you have been given your surgery date to enrolled in the Pre-Op Nutrition Class. You will need to attend this nutrition class 3-4 weeks prior to your surgery.  

## 2011-07-18 NOTE — Progress Notes (Signed)
  Pre-Op Assessment Visit:  Pre-Operative LAGB Surgery  Medical Nutrition Therapy:  Appt start time: 1530   End time:  1630.  Patient was seen on 07/18/2011 for Pre-Operative LAGB Nutrition Assessment. Assessment and letter of approval faxed to [facility]ric [facility] Program coordinator on 07/19/11.  Approval letter sent to [facility] center and will be available in the chart under the media tab.  Handouts given during visit include:  Pre-Op Goals   Bariatric Surgery Protein Shakes  Patient to call for Pre-Op and Post-Op Nutrition Education at the [facility] when surgery is scheduled.

## 2011-07-22 ENCOUNTER — Telehealth: Payer: Self-pay | Admitting: *Deleted

## 2011-07-22 MED ORDER — HYDROCODONE-ACETAMINOPHEN 5-500 MG PO TABS: 1.0000 | ORAL_TABLET | Freq: Four times a day (QID) | ORAL | Status: DC | PRN

## 2011-07-22 NOTE — Telephone Encounter (Signed)
Done

## 2011-07-22 NOTE — Telephone Encounter (Signed)
Rf req for Hydroco/APAP 5/500 1 po qid prn. Ok to Rf?

## 2011-07-22 NOTE — Telephone Encounter (Signed)
OK to fill this prescription with additional refills x1 Thank you!  

## 2011-07-30 ENCOUNTER — Ambulatory Visit (INDEPENDENT_AMBULATORY_CARE_PROVIDER_SITE_OTHER): Payer: BC Managed Care – PPO | Admitting: Internal Medicine

## 2011-07-30 ENCOUNTER — Encounter: Payer: Self-pay | Admitting: Internal Medicine

## 2011-07-30 VITALS — BP 120/88 | HR 88 | Temp 98.0°F | Resp 16 | Wt >= 6400 oz

## 2011-07-30 DIAGNOSIS — F329 Major depressive disorder, single episode, unspecified: Secondary | ICD-10-CM

## 2011-07-30 DIAGNOSIS — J45909 Unspecified asthma, uncomplicated: Secondary | ICD-10-CM

## 2011-07-30 DIAGNOSIS — I10 Essential (primary) hypertension: Secondary | ICD-10-CM

## 2011-07-30 NOTE — Assessment & Plan Note (Signed)
We registered wt

## 2011-07-30 NOTE — Assessment & Plan Note (Signed)
Continue with current prescription therapy as reflected on the Med list.  

## 2011-07-30 NOTE — Progress Notes (Signed)
Subjective:    Patient ID: Stephanie Harper, female    DOB: 01-25-1970, 41 y.o.   MRN: [id number]  HPI   The patient is here to follow up on chronic R shoulder pain, depression, anxiety, headaches and chronic moderate fibromyalgia symptoms controlled with medicines, diet and exercise.  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  Negative for fever, chills, diaphoresis, activity change, appetite change, fatigue and unexpected weight change.  HENT: Negative for hearing loss, ear pain, nosebleeds, congestion, sore throat, facial swelling, rhinorrhea, sneezing, mouth sores, trouble swallowing, neck pain, neck stiffness, postnasal drip, sinus pressure and tinnitus.   Eyes: Negative for pain, discharge, redness, itching and visual disturbance.  Respiratory: Negative for cough, chest tightness, shortness of breath, wheezing and stridor.   Cardiovascular: Negative for chest pain, palpitations and leg swelling.  Gastrointestinal: Negative for nausea, abdominal pain, diarrhea, constipation, blood in stool, abdominal distention, anal bleeding and rectal pain.  Genitourinary: Negative for dysuria, urgency, frequency, hematuria, flank pain, vaginal bleeding, vaginal discharge, difficulty urinating, genital sores and pelvic pain.  Musculoskeletal: Positive for back pain. Negative for joint swelling, arthralgias and gait problem.  Skin: Negative.  Negative for rash.  Neurological: Negative for dizziness, tremors, seizures, syncope, speech difficulty, weakness, numbness and headaches.  Hematological: Negative for adenopathy. Does not bruise/bleed easily.  Psychiatric/Behavioral: Negative for suicidal ideas, behavioral problems, disturbed wake/sleep cycle, dysphoric mood and decreased concentration. The patient is not nervous/anxious.    Wt Readings from Last 3 Encounters:  07/30/11 400 lb 4 oz (181.552 kg)    07/18/11 393 lb 14.4 oz (178.672 kg)  07/04/11 398 lb (180.532 kg)   BP Readings from Last 3 Encounters:  07/30/11 120/88  07/04/11 120/88  06/21/11 112/68         Objective:   Physical Exam  Constitutional: She appears well-developed. No distress.       Obese  HENT:  Head: Normocephalic.  Right Ear: External ear normal.  Left Ear: External ear normal.  Nose: Nose normal.  Mouth/Throat: Oropharynx is clear and moist.  Eyes: Conjunctivae are normal. Pupils are equal, round, and reactive to light. Right eye exhibits no discharge. Left eye exhibits no discharge.  Neck: Normal range of motion. Neck supple. No JVD present. No tracheal deviation present. No thyromegaly present.  Cardiovascular: Normal rate, regular rhythm and normal heart sounds.   Pulmonary/Chest: No stridor. No respiratory distress. She has no wheezes.  Abdominal: Soft. Bowel sounds are normal. She exhibits no distension and no mass. There is no tenderness. There is no rebound and no guarding.  Musculoskeletal: She exhibits no edema and no tenderness.  Lymphadenopathy:    She has no cervical adenopathy.  Neurological: She displays normal reflexes. No cranial nerve deficit. She exhibits normal muscle tone. Coordination normal.  Skin: No rash noted. No erythema.  Psychiatric: She has a normal mood and affect. Her behavior is normal. Judgment and thought content normal.   Lab Results  Component Value Date   WBC 11.2* 06/25/2011   HGB 14.1 06/25/2011   HCT 42.1 06/25/2011   PLT 366 06/25/2011   GLUCOSE 84 06/25/2011   CHOL 175 01/29/2010   TRIG 97.0 01/29/2010   HDL 36.30* 01/29/2010   LDLCALC 119* 01/29/2010   ALT 13 06/25/2011   AST 12 06/25/2011   NA 142 06/25/2011   K 4.9 06/25/2011   CL 104 06/25/2011   CREATININE  0.58 06/25/2011   BUN 10 06/25/2011   CO2 28 06/25/2011   TSH 2.491 06/25/2011   INR 1.0 08/09/2007   HGBA1C 5.7 11/05/2007          Assessment & Plan:

## 2011-08-23 ENCOUNTER — Other Ambulatory Visit: Payer: Self-pay

## 2011-08-23 MED ORDER — HYDROCORTISONE 2.5 % RE CREA: TOPICAL_CREAM | Freq: Two times a day (BID) | RECTAL | Status: DC | PRN

## 2011-09-03 ENCOUNTER — Ambulatory Visit (INDEPENDENT_AMBULATORY_CARE_PROVIDER_SITE_OTHER): Payer: BC Managed Care – PPO | Admitting: Internal Medicine

## 2011-09-03 ENCOUNTER — Encounter: Payer: Self-pay | Admitting: Internal Medicine

## 2011-09-03 DIAGNOSIS — G8929 Other chronic pain: Secondary | ICD-10-CM | POA: Diagnosis not present

## 2011-09-03 DIAGNOSIS — F329 Major depressive disorder, single episode, unspecified: Secondary | ICD-10-CM

## 2011-09-03 DIAGNOSIS — F411 Generalized anxiety disorder: Secondary | ICD-10-CM | POA: Diagnosis not present

## 2011-09-03 DIAGNOSIS — R51 Headache: Secondary | ICD-10-CM

## 2011-09-03 DIAGNOSIS — I10 Essential (primary) hypertension: Secondary | ICD-10-CM

## 2011-09-03 DIAGNOSIS — IMO0001 Reserved for inherently not codable concepts without codable children: Secondary | ICD-10-CM

## 2011-09-03 DIAGNOSIS — M25519 Pain in unspecified shoulder: Secondary | ICD-10-CM | POA: Diagnosis not present

## 2011-09-03 MED ORDER — FUROSEMIDE 40 MG PO TABS: 40.0000 mg | ORAL_TABLET | Freq: Every day | ORAL | Status: DC | PRN

## 2011-09-03 MED ORDER — POTASSIUM CHLORIDE ER 10 MEQ PO TBCR: 10.0000 meq | EXTENDED_RELEASE_TABLET | Freq: Every day | ORAL | Status: DC | PRN

## 2011-09-03 MED ORDER — HYDROCORTISONE ACETATE 25 MG RE SUPP: 25.0000 mg | Freq: Two times a day (BID) | RECTAL | Status: DC

## 2011-09-03 NOTE — Progress Notes (Signed)
Patient ID: Stephanie Harper, female   DOB: 07/09/1970, 41 y.o.   MRN: [id number]  Subjective:    Patient ID: Stephanie Harper, female    DOB: [birth year], 41 y.o.   MRN: [id number]  HPI   The patient is here to follow up on chronic R shoulder pain, depression, anxiety, headaches and chronic moderate fibromyalgia symptoms controlled with medicines, diet and exercise.   F/u severe obesity  Review of Systems  Constitutional: Negative.  Negative for fever, chills, diaphoresis, activity change, appetite change, fatigue and unexpected weight change.  HENT: Negative for hearing loss, ear pain, nosebleeds, congestion, sore throat, facial swelling, rhinorrhea, sneezing, mouth sores, trouble swallowing, neck pain, neck stiffness, postnasal drip, sinus pressure and tinnitus.   Eyes: Negative for pain, discharge, redness, itching and visual disturbance.  Respiratory: Negative for cough, chest tightness, shortness of breath, wheezing and stridor.   Cardiovascular: Negative for chest pain, palpitations and leg swelling.  Gastrointestinal: Negative for nausea, abdominal pain, diarrhea, constipation, blood in stool, abdominal distention, anal bleeding and rectal pain.  Genitourinary: Negative for dysuria, urgency, frequency, hematuria, flank pain, vaginal bleeding, vaginal discharge, difficulty urinating, genital sores and pelvic pain.  Musculoskeletal: Positive for back pain. Negative for joint swelling, arthralgias and gait problem.  Skin: Negative.  Negative for rash.  Neurological: Negative for dizziness, tremors, seizures, syncope, speech difficulty, weakness, numbness and headaches.  Hematological: Negative for adenopathy. Does not bruise/bleed easily.  Psychiatric/Behavioral: Negative for suicidal ideas, behavioral problems, disturbed wake/sleep cycle, dysphoric mood and decreased concentration. The patient is not nervous/anxious.    Wt Readings from Last 3 Encounters:  09/03/11 396 lb (179.624 kg)    07/30/11 400 lb 4 oz (181.552 kg)  07/18/11 393 lb 14.4 oz (178.672 kg)   BP Readings from Last 3 Encounters:  09/03/11 120/80  07/30/11 120/88  07/04/11 120/88         Objective:   Physical Exam  Constitutional: She appears well-developed. No distress.       Obese  HENT:  Head: Normocephalic.  Right Ear: External ear normal.  Left Ear: External ear normal.  Nose: Nose normal.  Mouth/Throat: Oropharynx is clear and moist.  Eyes: Conjunctivae are normal. Pupils are equal, round, and reactive to light. Right eye exhibits no discharge. Left eye exhibits no discharge.  Neck: Normal range of motion. Neck supple. No JVD present. No tracheal deviation present. No thyromegaly present.  Cardiovascular: Normal rate, regular rhythm and normal heart sounds.   Pulmonary/Chest: No stridor. No respiratory distress. She has no wheezes.  Abdominal: Soft. Bowel sounds are normal. She exhibits no distension and no mass. There is no tenderness. There is no rebound and no guarding.  Musculoskeletal: She exhibits no edema and no tenderness.  Lymphadenopathy:    She has no cervical adenopathy.  Neurological: She displays normal reflexes. No cranial nerve deficit. She exhibits normal muscle tone. Coordination normal.  Skin: No rash noted. No erythema.  Psychiatric: She has a normal mood and affect. Her behavior is normal. Judgment and thought content normal.   Lab Results  Component Value Date   WBC 11.2* 06/25/2011   HGB 14.1 06/25/2011   HCT 42.1 06/25/2011   PLT 366 06/25/2011   GLUCOSE 84 06/25/2011   CHOL 175 01/29/2010   TRIG 97.0 01/29/2010   HDL 36.30* 01/29/2010   LDLCALC 119* 01/29/2010   ALT 13 06/25/2011   AST 12 06/25/2011   NA 142 06/25/2011   K 4.9 06/25/2011   CL 104 06/25/2011   CREATININE 0.58  06/25/2011   BUN 10 06/25/2011   CO2 28 06/25/2011   TSH 2.491 06/25/2011   INR 1.0 08/09/2007   HGBA1C 5.7 11/05/2007          Assessment & Plan:

## 2011-09-03 NOTE — Patient Instructions (Addendum)
Wt Readings from Last 3 Encounters:  09/03/11 396 lb (179.624 kg)  07/30/11 400 lb 4 oz (181.552 kg)  07/18/11 393 lb 14.4 oz (178.672 kg)

## 2011-09-10 ENCOUNTER — Encounter: Payer: Self-pay | Admitting: Internal Medicine

## 2011-09-10 NOTE — Assessment & Plan Note (Signed)
Wt documented

## 2011-09-10 NOTE — Assessment & Plan Note (Signed)
Continue with current prescription therapy as reflected on the Med list.  

## 2011-09-19 ENCOUNTER — Telehealth: Payer: Self-pay | Admitting: *Deleted

## 2011-09-19 NOTE — Telephone Encounter (Signed)
Rf req for Hydroco/APAP 5/500 1 po qid.  Last filled 08/21/11. Ok to Rf?

## 2011-09-20 MED ORDER — HYDROCODONE-ACETAMINOPHEN 5-500 MG PO TABS: 1.0000 | ORAL_TABLET | Freq: Four times a day (QID) | ORAL | Status: DC | PRN

## 2011-09-20 NOTE — Telephone Encounter (Signed)
Ok to refill 30d, no refill (per protocol covering for absent PCP) - prescription printed and signed -  

## 2011-09-20 NOTE — Telephone Encounter (Signed)
Rx faxed to pharmacy  

## 2011-10-04 ENCOUNTER — Encounter: Payer: Self-pay | Admitting: Internal Medicine

## 2011-10-04 ENCOUNTER — Ambulatory Visit (INDEPENDENT_AMBULATORY_CARE_PROVIDER_SITE_OTHER): Payer: BC Managed Care – PPO | Admitting: Internal Medicine

## 2011-10-04 VITALS — BP 110/70 | HR 72 | Temp 97.8°F | Resp 16 | Wt 398.2 lb

## 2011-10-04 DIAGNOSIS — I10 Essential (primary) hypertension: Secondary | ICD-10-CM

## 2011-10-04 DIAGNOSIS — M2550 Pain in unspecified joint: Secondary | ICD-10-CM | POA: Diagnosis not present

## 2011-10-04 DIAGNOSIS — Z23 Encounter for immunization: Secondary | ICD-10-CM

## 2011-10-04 MED ORDER — NABUMETONE 750 MG PO TABS: 750.0000 mg | ORAL_TABLET | Freq: Two times a day (BID) | ORAL | Status: DC | PRN

## 2011-10-04 MED ORDER — HYDROCODONE-ACETAMINOPHEN 5-500 MG PO TABS: 1.0000 | ORAL_TABLET | Freq: Four times a day (QID) | ORAL | Status: DC | PRN

## 2011-10-04 NOTE — Progress Notes (Signed)
Subjective:    Patient ID: Stephanie Harper, female    DOB: Sep 30, 1970, 41 y.o.   MRN: [id number]  HPI   The patient is here to follow up on chronic R shoulder pain, depression, anxiety, headaches and chronic moderate fibromyalgia symptoms controlled with medicines, diet and exercise.  C/o bad arthralgias x 3 wks - all the time  F/u severe obesity  Review of Systems  Constitutional: Negative.  Negative for fever, chills, diaphoresis, activity change, appetite change, fatigue and unexpected weight change.  HENT: Negative for hearing loss, ear pain, nosebleeds, congestion, sore throat, facial swelling, rhinorrhea, sneezing, mouth sores, trouble swallowing, neck pain, neck stiffness, postnasal drip, sinus pressure and tinnitus.   Eyes: Negative for pain, discharge, redness, itching and visual disturbance.  Respiratory: Negative for cough, chest tightness, shortness of breath, wheezing and stridor.   Cardiovascular: Negative for chest pain, palpitations and leg swelling.  Gastrointestinal: Negative for nausea, abdominal pain, diarrhea, constipation, blood in stool, abdominal distention, anal bleeding and rectal pain.  Genitourinary: Negative for dysuria, urgency, frequency, hematuria, flank pain, vaginal bleeding, vaginal discharge, difficulty urinating, genital sores and pelvic pain.  Musculoskeletal: Positive for back pain. Negative for joint swelling, arthralgias and gait problem.  Skin: Negative.  Negative for rash.  Neurological: Negative for dizziness, tremors, seizures, syncope, speech difficulty, weakness, numbness and headaches.  Hematological: Negative for adenopathy. Does not bruise/bleed easily.  Psychiatric/Behavioral: Negative for suicidal ideas, behavioral problems, disturbed wake/sleep cycle, dysphoric mood and decreased concentration. The patient is not nervous/anxious.    Wt Readings from Last 3 Encounters:  10/04/11 398 lb 4 oz (180.645 kg)  09/03/11 396 lb (179.624 kg)    07/30/11 400 lb 4 oz (181.552 kg)   BP Readings from Last 3 Encounters:  10/04/11 110/70  09/03/11 120/80  07/30/11 120/88         Objective:   Physical Exam  Constitutional: She appears well-developed. No distress.       Obese  HENT:  Head: Normocephalic.  Right Ear: External ear normal.  Left Ear: External ear normal.  Nose: Nose normal.  Mouth/Throat: Oropharynx is clear and moist.  Eyes: Conjunctivae normal are normal. Pupils are equal, round, and reactive to light. Right eye exhibits no discharge. Left eye exhibits no discharge.  Neck: Normal range of motion. Neck supple. No JVD present. No tracheal deviation present. No thyromegaly present.  Cardiovascular: Normal rate, regular rhythm and normal heart sounds.   Pulmonary/Chest: No stridor. No respiratory distress. She has no wheezes.  Abdominal: Soft. Bowel sounds are normal. She exhibits no distension and no mass. There is no tenderness. There is no rebound and no guarding.  Musculoskeletal: She exhibits no edema and no tenderness.  Lymphadenopathy:    She has no cervical adenopathy.  Neurological: She displays normal reflexes. No cranial nerve deficit. She exhibits normal muscle tone. Coordination normal.  Skin: No rash noted. No erythema.  Psychiatric: She has a normal mood and affect. Her behavior is normal. Judgment and thought content normal.   Lab Results  Component Value Date   WBC 11.2* 06/25/2011   HGB 14.1 06/25/2011   HCT 42.1 06/25/2011   PLT 366 06/25/2011   GLUCOSE 84 06/25/2011   CHOL 175 01/29/2010   TRIG 97.0 01/29/2010   HDL 36.30* 01/29/2010   LDLCALC 119* 01/29/2010   ALT 13 06/25/2011   AST 12 06/25/2011   NA 142 06/25/2011   K 4.9 06/25/2011   CL 104 06/25/2011   CREATININE 0.58 06/25/2011   BUN  10 06/25/2011   CO2 28 06/25/2011   TSH 2.491 06/25/2011   INR 1.0 08/09/2007   HGBA1C 5.7 11/05/2007          Assessment & Plan:

## 2011-10-04 NOTE — Assessment & Plan Note (Signed)
Continue with current prescription therapy as reflected on the Med list.  

## 2011-10-04 NOTE — Assessment & Plan Note (Signed)
Wt Readings from Last 3 Encounters:  10/04/11 398 lb 4 oz (180.645 kg)  09/03/11 396 lb (179.624 kg)  07/30/11 400 lb 4 oz (181.552 kg)

## 2011-10-06 ENCOUNTER — Encounter: Payer: Self-pay | Admitting: Internal Medicine

## 2011-10-07 ENCOUNTER — Other Ambulatory Visit: Payer: Self-pay | Admitting: Internal Medicine

## 2011-10-07 NOTE — Telephone Encounter (Signed)
Ok to Rf? 

## 2011-10-09 ENCOUNTER — Other Ambulatory Visit: Payer: Self-pay | Admitting: Internal Medicine

## 2011-10-09 ENCOUNTER — Telehealth: Payer: Self-pay | Admitting: Internal Medicine

## 2011-10-09 NOTE — Telephone Encounter (Signed)
Noted. Thx.

## 2011-10-09 NOTE — Telephone Encounter (Signed)
Caller: Yukie/Patient; Patient Name: [name]; PCP: Sonda Primes (Adults only); Best Callback Phone Number: [phone].  Patient calling about alprazolam Rx.  States [facility] tells her there are no refills available for her.  Per Epic, Rx written 04/02/11 for alprazolam 1 mg, 1 po TID, #270, RF x 2.  Per [facility]ist [facility] [phone], filled Rx 04/12/11, 06/23/11 and again 09/03/11; should have tabs left in her latest fill, and is not eligible for refill until 12/03/11.  Patient advised; states does have plenty left.   Offered triage/appointment if increased anxiety; declines; states she inadvertently requested more xanax from [email or web address] and does not actually require refill at this time.  No further questions or concerns.

## 2011-10-11 NOTE — Telephone Encounter (Signed)
Ok to Rf? 

## 2011-11-01 ENCOUNTER — Ambulatory Visit (INDEPENDENT_AMBULATORY_CARE_PROVIDER_SITE_OTHER): Payer: BC Managed Care – PPO | Admitting: Internal Medicine

## 2011-11-01 ENCOUNTER — Encounter: Payer: Self-pay | Admitting: Internal Medicine

## 2011-11-01 VITALS — BP 118/70 | HR 80 | Temp 97.0°F | Resp 16 | Wt >= 6400 oz

## 2011-11-01 DIAGNOSIS — F411 Generalized anxiety disorder: Secondary | ICD-10-CM | POA: Diagnosis not present

## 2011-11-01 DIAGNOSIS — M545 Low back pain, unspecified: Secondary | ICD-10-CM

## 2011-11-01 DIAGNOSIS — R1012 Left upper quadrant pain: Secondary | ICD-10-CM

## 2011-11-01 DIAGNOSIS — I10 Essential (primary) hypertension: Secondary | ICD-10-CM

## 2011-11-01 DIAGNOSIS — F329 Major depressive disorder, single episode, unspecified: Secondary | ICD-10-CM

## 2011-11-01 DIAGNOSIS — Z23 Encounter for immunization: Secondary | ICD-10-CM

## 2011-11-01 DIAGNOSIS — M2550 Pain in unspecified joint: Secondary | ICD-10-CM

## 2011-11-01 NOTE — Progress Notes (Signed)
Patient ID: Stephanie Harper, female   DOB: 1970/10/09, 41 y.o.   MRN: [id number]   Subjective:    Patient ID: Stephanie Harper, female    DOB: Mar 30, 1970, 41 y.o.   MRN: [id number]  HPI   The patient is here to follow up on chronic R shoulder pain, depression, anxiety, headaches and chronic moderate fibromyalgia symptoms controlled with medicines, diet and exercise.  C/o bad arthralgias x 3 wks - all the time C/o LE joints in LEs B and LS spine - relafen did not help...  F/u severe obesity    Review of Systems  Constitutional: Negative.  Negative for fever, chills, diaphoresis, activity change, appetite change, fatigue and unexpected weight change.  HENT: Negative for hearing loss, ear pain, nosebleeds, congestion, sore throat, facial swelling, rhinorrhea, sneezing, mouth sores, trouble swallowing, neck pain, neck stiffness, postnasal drip, sinus pressure and tinnitus.   Eyes: Negative for pain, discharge, redness, itching and visual disturbance.  Respiratory: Negative for cough, chest tightness, shortness of breath, wheezing and stridor.   Cardiovascular: Negative for chest pain, palpitations and leg swelling.  Gastrointestinal: Negative for nausea, abdominal pain, diarrhea, constipation, blood in stool, abdominal distention, anal bleeding and rectal pain.  Genitourinary: Negative for dysuria, urgency, frequency, hematuria, flank pain, vaginal bleeding, vaginal discharge, difficulty urinating, genital sores and pelvic pain.  Musculoskeletal: Positive for back pain. Negative for joint swelling, arthralgias and gait problem.  Skin: Negative.  Negative for rash.  Neurological: Negative for dizziness, tremors, seizures, syncope, speech difficulty, weakness, numbness and headaches.  Hematological: Negative for adenopathy. Does not bruise/bleed easily.  Psychiatric/Behavioral: Negative for suicidal ideas, behavioral problems, disturbed wake/sleep cycle, dysphoric mood and decreased concentration.  The patient is not nervous/anxious.    Wt Readings from Last 3 Encounters:  11/01/11 401 lb 4 oz (182.006 kg)  10/04/11 398 lb 4 oz (180.645 kg)  09/03/11 396 lb (179.624 kg)   BP Readings from Last 3 Encounters:  11/01/11 118/70  10/04/11 110/70  09/03/11 120/80         Objective:   Physical Exam  Constitutional: She appears well-developed. No distress.       Obese  HENT:  Head: Normocephalic.  Right Ear: External ear normal.  Left Ear: External ear normal.  Nose: Nose normal.  Mouth/Throat: Oropharynx is clear and moist.  Eyes: Conjunctivae normal are normal. Pupils are equal, round, and reactive to light. Right eye exhibits no discharge. Left eye exhibits no discharge.  Neck: Normal range of motion. Neck supple. No JVD present. No tracheal deviation present. No thyromegaly present.  Cardiovascular: Normal rate, regular rhythm and normal heart sounds.   Pulmonary/Chest: No stridor. No respiratory distress. She has no wheezes.  Abdominal: Soft. Bowel sounds are normal. She exhibits no distension and no mass. There is no tenderness. There is no rebound and no guarding.  Musculoskeletal: She exhibits no edema and no tenderness.  Lymphadenopathy:    She has no cervical adenopathy.  Neurological: She displays normal reflexes. No cranial nerve deficit. She exhibits normal muscle tone. Coordination normal.  Skin: No rash noted. No erythema.  Psychiatric: She has a normal mood and affect. Her behavior is normal. Judgment and thought content normal.   Lab Results  Component Value Date   WBC 11.2* 06/25/2011   HGB 14.1 06/25/2011   HCT 42.1 06/25/2011   PLT 366 06/25/2011   GLUCOSE 84 06/25/2011   CHOL 175 01/29/2010   TRIG 97.0 01/29/2010   HDL 36.30* 01/29/2010   LDLCALC 119* 01/29/2010  ALT 13 06/25/2011   AST 12 06/25/2011   NA 142 06/25/2011   K 4.9 06/25/2011   CL 104 06/25/2011   CREATININE 0.58 06/25/2011   BUN 10 06/25/2011   CO2 28 06/25/2011   TSH 2.491 06/25/2011   INR  1.0 08/09/2007   HGBA1C 5.7 11/05/2007          Assessment & Plan:

## 2011-11-03 NOTE — Assessment & Plan Note (Signed)
Continue with current prescription therapy as reflected on the Med list.  

## 2011-11-03 NOTE — Assessment & Plan Note (Signed)
Wt Readings from Last 3 Encounters:  11/01/11 401 lb 4 oz (182.006 kg)  10/04/11 398 lb 4 oz (180.645 kg)  09/03/11 396 lb (179.624 kg)

## 2011-11-03 NOTE — Assessment & Plan Note (Signed)
9/13 polyarthralgia x 3 wks -- ?post-viral vs other 10/13 - not better See meds

## 2011-11-03 NOTE — Assessment & Plan Note (Signed)
resolved 

## 2011-11-26 ENCOUNTER — Encounter: Payer: Self-pay | Admitting: Internal Medicine

## 2011-11-28 ENCOUNTER — Telehealth: Payer: Self-pay | Admitting: Internal Medicine

## 2011-11-28 NOTE — Telephone Encounter (Signed)
Stephanie Harper, please, write a letter: see My chart message Thx

## 2011-11-28 NOTE — Telephone Encounter (Signed)
Letter and vital flow sheet faxed to Katie at [phone].

## 2011-11-28 NOTE — Telephone Encounter (Signed)
Letter printed/pending MD sig. 

## 2011-12-03 ENCOUNTER — Other Ambulatory Visit: Payer: Self-pay | Admitting: Internal Medicine

## 2011-12-04 NOTE — Telephone Encounter (Signed)
Ok to RF? 

## 2011-12-16 ENCOUNTER — Encounter: Payer: Self-pay | Admitting: Internal Medicine

## 2011-12-19 ENCOUNTER — Encounter: Payer: Self-pay | Admitting: Internal Medicine

## 2011-12-19 ENCOUNTER — Other Ambulatory Visit: Payer: Self-pay | Admitting: Internal Medicine

## 2011-12-20 NOTE — Telephone Encounter (Signed)
Ok to RF? 

## 2011-12-22 ENCOUNTER — Other Ambulatory Visit: Payer: Self-pay | Admitting: Internal Medicine

## 2011-12-24 ENCOUNTER — Telehealth: Payer: Self-pay | Admitting: Internal Medicine

## 2011-12-24 MED ORDER — HYDROCODONE-ACETAMINOPHEN 5-500 MG PO TABS: 1.0000 | ORAL_TABLET | Freq: Four times a day (QID) | ORAL | Status: DC | PRN

## 2011-12-24 MED ORDER — ALBUTEROL SULFATE HFA 108 (90 BASE) MCG/ACT IN AERS: 2.0000 | INHALATION_SPRAY | Freq: Four times a day (QID) | RESPIRATORY_TRACT | Status: DC | PRN

## 2011-12-24 NOTE — Telephone Encounter (Signed)
Rx corrected/phoned in. Pt informed

## 2011-12-24 NOTE — Telephone Encounter (Signed)
Patient Information:  Caller Name: Edessa  Phone: [phone]  Patient: Stephanie Harper  Gender: Female  DOB: 1970-04-09  Age: 41 Years  PCP: Plotnikov, Alex (Adults only)  Pregnant: No  Office Follow Up:  Does the office need to follow up with this patient?: Yes  Instructions For The Office: Please call concerning refill for pain medication  RN Note:  Patient states she sent 2 emails concerning her pain medication refill and has not received a response nor has the pharmacy received a response. This is for chronic pain she has in shoulders and legs. She reports doctor told her she could take more of her medication for her pain if needed so now she is out of medication.  Symptoms  Reason For Call & Symptoms: both shoulders and both legs having pain, she rates pain at a 6 right now. She sent 2 emails concerning pain medication refill. She states physician asked her to find a regimen that works for her with her pain medication. She has to use 2 pills q 4-6hrs sometimes as needed so she is out of medication.  Reviewed Health History In EMR: Yes  Reviewed Medications In EMR: Yes  Reviewed Allergies In EMR: Yes  Reviewed Surgeries / Procedures: Yes  Date of Onset of Symptoms: 09/09/2011  Treatments Tried: Hydrocodone, heating pad  Treatments Tried Worked: No OB:  LMP: Unknown  Guideline(s) Used:  Shoulder Pain  Disposition Per Guideline:   See Today in Office  Reason For Disposition Reached:   Weakness (i.e., loss of strength) in hand or fingers (Exception: not truly weak; hand feels weak because of pain) She states weakness is a chronic symptom.   Advice Given:  Call Back If  You become worse.  Patient Refused Recommendation:  Patient Requests Prescription  Calling concerning refill of pain medication

## 2011-12-24 NOTE — Telephone Encounter (Signed)
OK #140/month Sig: take 1-2 qid prn Thx

## 2011-12-24 NOTE — Telephone Encounter (Signed)
Pt informed - she is having to take 2 po qid on some days due to her chronic pain. She states it has to do with the weather and temperatures outside. She states she needs more than #120. Please advise. I've already phoned in Rx for #120.

## 2011-12-24 NOTE — Telephone Encounter (Signed)
To ref meds Thx

## 2011-12-24 NOTE — Telephone Encounter (Signed)
Ok to ref Thx 

## 2011-12-26 ENCOUNTER — Encounter: Payer: BC Managed Care – PPO | Attending: Surgery | Admitting: *Deleted

## 2011-12-26 DIAGNOSIS — Z713 Dietary counseling and surveillance: Secondary | ICD-10-CM | POA: Insufficient documentation

## 2011-12-26 DIAGNOSIS — Z01818 Encounter for other preprocedural examination: Secondary | ICD-10-CM | POA: Insufficient documentation

## 2011-12-27 NOTE — Progress Notes (Signed)
Bariatric Class:  Appt start time: 0830 end time:  0930.  Pre-Operative Nutrition Class  Patient was seen on 12/26/11 for Pre-Operative Bariatric Surgery Education at [facility].   Surgery date: 01/21/12 Surgery type: LAGB Start weight at [facility]: 393.9 lbs (07/18/11)  Weight today: 393.0 lbs Weight change: 0.9 lbs Total weight lost: 0.9 lbs BMI: 61.6  Samples given per MNT protocol: Bariatric Advantage Multivitamin Lot # [id number] Exp: 06/15  Bariatric Advantage Calcium Citrate Lot # [id number] Exp:12/13  Celebrate Vitamins Multivitamin Complete Lot # [id number] Exp: 06/15  Celebrate Vitamins Multivitamin Lot # [id number] Exp: 01/15  Celebrate Vitamins Iron + C (30 mg) Lot # [id number] Exp: 09/15  Celebrate Vitamins Sublingual B12 (500 mcg) Lot # [id number] Exp: 05/15  Celebrate Vitamins Calcium Citrate Lot # [id number] Exp: 03/15  Opurity Band-Optimized MVI Lot # [id number] Exp: 02/14  Corliss Marcus Protein Powder Lot # [id number] Exp: 03/15  Premier Protein Shake Lot # [id number] Exp: 10/25/12  The following the learning objective met by the patient during this course:  Identifies Pre-Op Dietary Goals and will begin 2 weeks pre-operatively  Identifies appropriate sources of fluids and proteins   States protein recommendations and appropriate sources pre and post-operatively  Identifies Post-Operative Dietary Goals and will follow for 2 weeks post-operatively  Identifies appropriate multivitamin and calcium sources  Describes the need for physical activity post-operatively and will follow MD recommendations  States when to call healthcare provider regarding medication questions or post-operative complications  Handouts given during class include:  Pre-Op Bariatric Surgery Diet Handout  Protein Shake Handout  Post-Op Bariatric Surgery Nutrition Handout  BELT Program Information Flyer  Support Group Information Flyer  [facility] Bariatric  Supplements Price List  Follow-Up Plan: Patient will follow-up at [facility] 2 weeks post operatively for diet advancement per MD.

## 2011-12-29 NOTE — Patient Instructions (Addendum)
Follow:   Pre-Op Diet per MD 2 weeks prior to surgery  Phase 2- Liquids (clear/full) 2 weeks after surgery  Vitamin/Mineral/Calcium guidelines for purchasing bariatric supplements  Exercise guidelines pre and post-op per MD  Follow-up at [facility] in 2 weeks post-op for diet advancement. Contact Shaunessy Dobratz as needed with questions/concerns. 

## 2011-12-30 ENCOUNTER — Encounter: Payer: Self-pay | Admitting: *Deleted

## 2012-01-07 ENCOUNTER — Encounter (HOSPITAL_COMMUNITY): Payer: Self-pay | Admitting: Pharmacy Technician

## 2012-01-07 NOTE — Progress Notes (Signed)
01-07-12 Presurgical testing appt. For 1-3, need MD order entry in Epic.Stephanie Harper

## 2012-01-10 ENCOUNTER — Ambulatory Visit (INDEPENDENT_AMBULATORY_CARE_PROVIDER_SITE_OTHER): Payer: BC Managed Care – PPO | Admitting: Surgery

## 2012-01-10 ENCOUNTER — Ambulatory Visit (INDEPENDENT_AMBULATORY_CARE_PROVIDER_SITE_OTHER): Payer: Managed Care, Other (non HMO) | Admitting: Surgery

## 2012-01-10 ENCOUNTER — Encounter (HOSPITAL_COMMUNITY): Payer: Self-pay

## 2012-01-10 ENCOUNTER — Encounter (HOSPITAL_COMMUNITY)
Admission: RE | Admit: 2012-01-10 | Discharge: 2012-01-10 | Disposition: A | Discharge status: Home / Self Care | Payer: Managed Care, Other (non HMO) | Source: Ambulatory Visit | Attending: Surgery | Admitting: Surgery

## 2012-01-10 ENCOUNTER — Encounter (INDEPENDENT_AMBULATORY_CARE_PROVIDER_SITE_OTHER): Payer: Self-pay | Admitting: Surgery

## 2012-01-10 DIAGNOSIS — F17200 Nicotine dependence, unspecified, uncomplicated: Secondary | ICD-10-CM | POA: Diagnosis not present

## 2012-01-10 DIAGNOSIS — I10 Essential (primary) hypertension: Secondary | ICD-10-CM | POA: Diagnosis not present

## 2012-01-10 DIAGNOSIS — Z01812 Encounter for preprocedural laboratory examination: Secondary | ICD-10-CM | POA: Diagnosis not present

## 2012-01-10 DIAGNOSIS — J45909 Unspecified asthma, uncomplicated: Secondary | ICD-10-CM | POA: Diagnosis not present

## 2012-01-10 DIAGNOSIS — Z79899 Other long term (current) drug therapy: Secondary | ICD-10-CM | POA: Diagnosis not present

## 2012-01-10 DIAGNOSIS — Z6841 Body Mass Index (BMI) 40.0 and over, adult: Secondary | ICD-10-CM | POA: Diagnosis not present

## 2012-01-10 DIAGNOSIS — Z96619 Presence of unspecified artificial shoulder joint: Secondary | ICD-10-CM | POA: Diagnosis not present

## 2012-01-10 HISTORY — DX: Other complications of anesthesia, initial encounter: T88.59XA

## 2012-01-10 HISTORY — DX: Pain, unspecified: R52

## 2012-01-10 HISTORY — DX: Adverse effect of unspecified anesthetic, initial encounter: T41.45XA

## 2012-01-10 LAB — CBC WITH DIFFERENTIAL/PLATELET
Basophils Absolute: 0 10*3/uL (ref 0.0–0.1)
Lymphocytes Relative: 18 % (ref 12–46)
Lymphs Abs: 1.7 10*3/uL (ref 0.7–4.0)
MCV: 87.3 fL (ref 78.0–100.0)
Neutro Abs: 6.9 10*3/uL (ref 1.7–7.7)
Platelets: 342 10*3/uL (ref 150–400)
RBC: 4.57 MIL/uL (ref 3.87–5.11)
RDW: 13.5 % (ref 11.5–15.5)
WBC: 9.7 10*3/uL (ref 4.0–10.5)

## 2012-01-10 LAB — COMPREHENSIVE METABOLIC PANEL
ALT: 24 U/L (ref 0–35)
AST: 19 U/L (ref 0–37)
Alkaline Phosphatase: 99 U/L (ref 39–117)
CO2: 29 mEq/L (ref 19–32)
Calcium: 9.5 mg/dL (ref 8.4–10.5)
Chloride: 101 mEq/L (ref 96–112)
GFR calc Af Amer: >90 mL/min (ref >90)
GFR calc non Af Amer: >90 mL/min (ref >90)
Glucose, Bld: 86 mg/dL (ref 70–99)
Potassium: 4 mEq/L (ref 3.5–5.1)
Sodium: 139 mEq/L (ref 135–145)
Total Bilirubin: 0.4 mg/dL (ref 0.3–1.2)

## 2012-01-10 LAB — SURGICAL PCR SCREEN: Staphylococcus aureus: NEGATIVE

## 2012-01-10 NOTE — Progress Notes (Signed)
Chief Complaint:  Morbid obesity BMI 61    History of Present Illness:  Stephanie Harper is an 42 y.o. 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  She is trying to adjust her schedule and food intake.  She had an UGI that didn't show a hiatus hernia.  She has had lap chole and lap tubal.   Past Medical History  Diagnosis Date  . Depression   . LBP (low back pain)   . Asthma   . Anxiety   . HTN (hypertension)   . Obesity   . Anal fissure 2012    Dr Christella Hartigan  . Osteomyelitis     at [facility]  . Shoulder fracture, left   . Baker's cyst of knee     Right knee  . Sepsis(995.91) 2005    From shoulder replacement surgery    Past Surgical History  Procedure Date  . Total shoulder replacement     Right  . Breath tek h pylori 07/15/2011    Procedure: BREATH TEK H PYLORI;  Surgeon: Valarie Merino, MD;  Location: [facility];  Service: General;  Laterality: N/A;  . Tubal ligation   . Laser ablation   . Cholecystectomy   . Appendectomy   . Tonsillectomy     Current Outpatient Prescriptions  Medication Sig Dispense Refill  . albuterol (VENTOLIN HFA) 108 (90 BASE) MCG/ACT inhaler Inhale 2 puffs into the lungs every 6 (six) hours as needed for wheezing.  18 each  1  . ALPRAZolam (XANAX) 1 MG tablet TAKE 1 TABLET BY MOUTH 3 TIMES A DAY AS NEEDED SLEEP OR ANXIETY  270 tablet  2  . buPROPion (WELLBUTRIN SR) 100 MG 12 hr tablet Take 1 tablet (100 mg total) by mouth 2 (two) times daily.  180 tablet  2  . Cholecalciferol 1000 UNITS tablet Take 1,000 Units by mouth daily.        . furosemide (LASIX) 40 MG tablet Take 1-2 tablets (40-80 mg total) by mouth daily as needed.  30 tablet  11  . HYDROcodone-acetaminophen (VICODIN) 5-500 MG per tablet Take 1-2 tablets by mouth 4 (four) times daily as needed for pain.  140 tablet  1  . losartan (COZAAR) 100 MG tablet Take 100 mg by mouth daily before breakfast.      . nabumetone (RELAFEN) 750 MG tablet Take 1 tablet (750 mg total) by  mouth 2 (two) times daily as needed for pain.  60 tablet  3  . PARoxetine (PAXIL) 10 MG tablet Take 10 mg by mouth daily before breakfast.      . potassium chloride (KLOR-CON 10) 10 MEQ tablet Take 1 tablet (10 mEq total) by mouth daily as needed (take w/Lasix).  30 tablet  11  . vitamin B-12 (CYANOCOBALAMIN) 1000 MCG tablet Take 1,000 mcg by mouth daily.         Adhesive Family History  Problem Relation Age of Onset  . Coronary artery disease Other   . Hyperlipidemia Other   . Hypertension Other   . Arthritis Mother   . Diabetes Mother   . Hyperlipidemia Mother   . Hypertension Mother   . Arthritis Father   . Diabetes Father   . Hyperlipidemia Father   . Hypertension Father   . Heart disease Father    Social History:   reports that she has been smoking Cigarettes.  She has a 15.5 pack-year smoking history. She does not have any smokeless tobacco history  on file. She reports that she does not drink alcohol or use illicit drugs.   REVIEW OF SYSTEMS - PERTINENT POSITIVES ONLY: No history of DVT  Physical Exam:   Blood pressure 136/88, pulse 92, temperature 98.2 F (36.8 C), temperature source Temporal, resp. rate 24, height 5' 7.5" (1.715 m), weight 397 lb (180.078 kg). Body mass index is 61.26 kg/(m^2).  Gen:  WDWN WF NAD  Neurological: Alert and oriented to person, place, and time. Motor and sensory function is grossly intact  Head: Normocephalic and atraumatic.  Eyes: Conjunctivae are normal. Pupils are equal, round, and reactive to light. No scleral icterus.  Neck: Normal range of motion. Neck supple. No tracheal deviation or thyromegaly present.  Cardiovascular:  SR without murmurs or gallops.  No carotid bruits Respiratory: Effort normal.  No respiratory distress. No chest wall tenderness. Breath sounds normal.  No wheezes, rales or rhonchi.  Abdomen: obese but nontender. GU: Musculoskeletal: Normal range of motion. Extremities are nontender. No cyanosis, edema or  clubbing noted Lymphadenopathy: No cervical, preauricular, postauricular or axillary adenopathy is present Skin: Skin is warm and dry. No rash noted. No diaphoresis. No erythema. No pallor. Pscyh: Normal mood and affect. Behavior is normal. Judgment and thought content normal.   LABORATORY RESULTS: No results found for this or any previous visit (from the past 48 hour(s)).  RADIOLOGY RESULTS: No results found.  Problem List: Patient Active Problem List  Diagnosis  . OBESITY, MORBID  . ANXIETY  . PANIC ATTACK  . TOBACCO USE DISORDER/SMOKER-SMOKING CESSATION DISCUSSED  . DEPRESSION  . HYPERTENSION  . UPPER RESPIRATORY INFECTION, ACUTE  . ASTHMA  . CONTACT DERMATITIS  . SHOULDER PAIN  . ELBOW PAIN  . LOW BACK PAIN  . ANAL OR RECTAL PAIN  . HEMATOCHEZIA  . Night sweats  . Arthralgia    Assessment & Plan: Morbid obesity  We reviewed the permit for lap band. She is scheduled January 14. She is nervous and excited and seems fairly good fund of knowledge about what she is getting into. She is begun keeping a food journal and avoiding carbohydrates.    Matt B. Daphine Deutscher, MD, [facility] [phone] beeper [phone]  01/10/2012 11:08 AM

## 2012-01-10 NOTE — Pre-Procedure Instructions (Signed)
PREOP CBC, DIFF, CMET WERE DONE TODAY AT [facility] AS PER ORDERS DR. MARTIN. CXR AND EKG REPORTS ARE IN EPIC - DONE 06/26/11

## 2012-01-10 NOTE — Patient Instructions (Signed)

## 2012-01-10 NOTE — Patient Instructions (Signed)
YOUR SURGERY IS SCHEDULED AT [facility]  ON:  Tuesday 1/14  REPORT TO [facility] AT: 7:30 AM      PHONE # FOR SHORT STAY IS [phone]  DO NOT EAT OR DRINK ANYTHING AFTER MIDNIGHT THE NIGHT BEFORE YOUR SURGERY.  YOU MAY BRUSH YOUR TEETH, RINSE OUT YOUR MOUTH--BUT NO WATER, NO FOOD, NO CHEWING GUM, NO MINTS, NO CANDIES, NO CHEWING TOBACCO.  PLEASE TAKE THE FOLLOWING MEDICATIONS THE AM OF YOUR SURGERY WITH A FEW SIPS OF WATER:  ALPRAZOLAM, BUPROPION, PAROXETINE.   USE YOUR ALBUTEROL INHALER AND BRING TO SURGERY.  IF YOU USE INHALERS--USE YOUR INHALERS THE AM OF YOUR SURGERY AND BRING INHALERS TO THE HOSPITAL -TAKE TO SURGERY.    IF YOU ARE DIABETIC:  DO NOT TAKE ANY DIABETIC MEDICATIONS THE AM OF YOUR SURGERY.  IF YOU TAKE INSULIN IN THE EVENINGS--PLEASE ONLY TAKE 1/2 NORMAL EVENING DOSE THE NIGHT BEFORE YOUR SURGERY.  NO INSULIN THE AM OF YOUR SURGERY.  IF YOU HAVE SLEEP APNEA AND USE CPAP OR BIPAP--PLEASE BRING THE MASK AND THE TUBING.  DO NOT BRING YOUR MACHINE.  DO NOT BRING VALUABLES, MONEY, CREDIT CARDS.  DO NOT WEAR JEWELRY, MAKE-UP, NAIL POLISH AND NO METAL PINS OR CLIPS IN YOUR HAIR. CONTACT LENS, DENTURES / PARTIALS, GLASSES SHOULD NOT BE WORN TO SURGERY AND IN MOST CASES-HEARING AIDS WILL NEED TO BE REMOVED.  BRING YOUR GLASSES CASE, ANY EQUIPMENT NEEDED FOR YOUR CONTACT LENS. FOR PATIENTS ADMITTED TO THE HOSPITAL--CHECK OUT TIME THE DAY OF DISCHARGE IS 11:00 AM.  ALL INPATIENT ROOMS ARE PRIVATE - WITH BATHROOM, TELEPHONE, TELEVISION AND WIFI INTERNET.  IF YOU ARE BEING DISCHARGED THE SAME DAY OF YOUR SURGERY--YOU CAN NOT DRIVE YOURSELF HOME--AND SHOULD NOT GO HOME ALONE BY TAXI OR BUS.  NO DRIVING OR OPERATING MACHINERY FOR 24 HOURS FOLLOWING ANESTHESIA / PAIN MEDICATIONS.  PLEASE MAKE ARRANGEMENTS FOR SOMEONE TO BE WITH YOU AT HOME THE FIRST 24 HOURS AFTER SURGERY. RESPONSIBLE DRIVER'S NAME___________________________           PHONE #   _______________________                               PLEASE READ OVER ANY  FACT SHEETS THAT YOU WERE GIVEN: MRSA INFORMATION, BLOOD TRANSFUSION INFORMATION, INCENTIVE SPIROMETER INFORMATION. FAILURE TO FOLLOW THESE INSTRUCTIONS MAY RESULT IN THE CANCELLATION OF YOUR SURGERY.   PATIENT SIGNATURE_________________________________

## 2012-01-13 NOTE — Pre-Procedure Instructions (Signed)
Order for bari bed in epic-pt's weight 395 lbs - and Christina with [employer] notified date / time of surgery and  Patient name / medical record number  - need for [employer].

## 2012-01-15 ENCOUNTER — Telehealth (INDEPENDENT_AMBULATORY_CARE_PROVIDER_SITE_OTHER): Payer: Self-pay

## 2012-01-15 NOTE — Telephone Encounter (Signed)
Called pt in regards to a bowel prep. I let the pt know that she is not required to have a bowel prep, though she should take a laxative / mirilax the day prior to Sx.

## 2012-01-21 ENCOUNTER — Inpatient Hospital Stay (HOSPITAL_COMMUNITY): Payer: Managed Care, Other (non HMO) | Admitting: Anesthesiology

## 2012-01-21 ENCOUNTER — Encounter (HOSPITAL_COMMUNITY)
Admission: RE | Disposition: A | Discharge status: Home / Self Care | Payer: Self-pay | Source: Ambulatory Visit | Attending: Surgery

## 2012-01-21 ENCOUNTER — Encounter (HOSPITAL_COMMUNITY): Payer: Self-pay | Admitting: Anesthesiology

## 2012-01-21 ENCOUNTER — Encounter (HOSPITAL_COMMUNITY): Payer: Self-pay | Admitting: *Deleted

## 2012-01-21 ENCOUNTER — Observation Stay (HOSPITAL_COMMUNITY)
Admission: RE | Admit: 2012-01-21 | Discharge: 2012-01-22 | Disposition: A | Discharge status: Home / Self Care | Payer: Managed Care, Other (non HMO) | Source: Ambulatory Visit | Attending: Surgery | Admitting: Surgery

## 2012-01-21 DIAGNOSIS — A419 Sepsis, unspecified organism: Secondary | ICD-10-CM | POA: Diagnosis not present

## 2012-01-21 DIAGNOSIS — F17200 Nicotine dependence, unspecified, uncomplicated: Secondary | ICD-10-CM | POA: Insufficient documentation

## 2012-01-21 DIAGNOSIS — Z9884 Bariatric surgery status: Secondary | ICD-10-CM

## 2012-01-21 DIAGNOSIS — Z79899 Other long term (current) drug therapy: Secondary | ICD-10-CM | POA: Insufficient documentation

## 2012-01-21 DIAGNOSIS — I10 Essential (primary) hypertension: Secondary | ICD-10-CM | POA: Diagnosis not present

## 2012-01-21 DIAGNOSIS — Z6841 Body Mass Index (BMI) 40.0 and over, adult: Secondary | ICD-10-CM | POA: Diagnosis not present

## 2012-01-21 DIAGNOSIS — M545 Low back pain, unspecified: Secondary | ICD-10-CM | POA: Diagnosis not present

## 2012-01-21 DIAGNOSIS — Z01812 Encounter for preprocedural laboratory examination: Secondary | ICD-10-CM | POA: Insufficient documentation

## 2012-01-21 DIAGNOSIS — J45909 Unspecified asthma, uncomplicated: Secondary | ICD-10-CM | POA: Diagnosis not present

## 2012-01-21 DIAGNOSIS — Z96619 Presence of unspecified artificial shoulder joint: Secondary | ICD-10-CM | POA: Insufficient documentation

## 2012-01-21 HISTORY — PX: MESH APPLIED TO LAP PORT: SHX5969

## 2012-01-21 HISTORY — PX: LAPAROSCOPIC GASTRIC BANDING: SHX1100

## 2012-01-21 LAB — CBC
HCT: 38.9 % (ref 36.0–46.0)
MCH: 29.3 pg (ref 26.0–34.0)
MCV: 89 fL (ref 78.0–100.0)
Platelets: 286 10*3/uL (ref 150–400)
RDW: 13.7 % (ref 11.5–15.5)

## 2012-01-21 LAB — PREGNANCY, URINE: Preg Test, Ur: NEGATIVE

## 2012-01-21 SURGERY — GASTRIC BANDING, LAPAROSCOPIC
Anesthesia: General | Site: Abdomen | Wound class: Clean

## 2012-01-21 MED ORDER — UNJURY VANILLA POWDER: 2.0000 [oz_av] | Freq: Four times a day (QID) | ORAL | Status: DC

## 2012-01-21 MED ORDER — NABUMETONE 750 MG PO TABS
750.0000 mg | ORAL_TABLET | Freq: Two times a day (BID) | ORAL | Status: DC
  Administered 2012-01-21 – 2012-01-22 (×2): 750 mg via ORAL
  Filled 2012-01-21 (×3): qty 1

## 2012-01-21 MED ORDER — ONDANSETRON HCL 4 MG/2ML IJ SOLN
INTRAMUSCULAR | Status: DC | PRN
  Administered 2012-01-21: 4 mg via INTRAVENOUS

## 2012-01-21 MED ORDER — UNJURY CHOCOLATE CLASSIC POWDER
2.0000 [oz_av] | Freq: Four times a day (QID) | ORAL | Status: DC
  Administered 2012-01-22: 2 [oz_av] via ORAL

## 2012-01-21 MED ORDER — ACETAMINOPHEN 10 MG/ML IV SOLN
INTRAVENOUS | Status: DC | PRN
  Administered 2012-01-21: 1000 mg via INTRAVENOUS

## 2012-01-21 MED ORDER — LOSARTAN POTASSIUM 50 MG PO TABS
100.0000 mg | ORAL_TABLET | Freq: Every day | ORAL | Status: DC
  Administered 2012-01-22: 100 mg via ORAL
  Filled 2012-01-21 (×2): qty 2

## 2012-01-21 MED ORDER — MIDAZOLAM HCL 5 MG/5ML IJ SOLN
INTRAMUSCULAR | Status: DC | PRN
  Administered 2012-01-21 (×2): 1 mg via INTRAVENOUS
  Administered 2012-01-21: 2 mg via INTRAVENOUS

## 2012-01-21 MED ORDER — 0.9 % SODIUM CHLORIDE (POUR BTL) OPTIME
TOPICAL | Status: DC | PRN
  Administered 2012-01-21: 1000 mL

## 2012-01-21 MED ORDER — FENTANYL CITRATE 0.05 MG/ML IJ SOLN
25.0000 ug | INTRAMUSCULAR | Status: DC | PRN
  Administered 2012-01-21 (×2): 50 ug via INTRAVENOUS

## 2012-01-21 MED ORDER — SODIUM CHLORIDE 0.9 % IJ SOLN
INTRAMUSCULAR | Status: DC | PRN
  Administered 2012-01-21: 20 mL via INTRAVENOUS

## 2012-01-21 MED ORDER — BUPIVACAINE LIPOSOME 1.3 % IJ SUSP
20.0000 mL | Freq: Once | INTRAMUSCULAR | Status: AC
  Administered 2012-01-21: 20 mL
  Filled 2012-01-21: qty 20

## 2012-01-21 MED ORDER — GLYCOPYRROLATE 0.2 MG/ML IJ SOLN
INTRAMUSCULAR | Status: DC | PRN
  Administered 2012-01-21: .8 mg via INTRAVENOUS

## 2012-01-21 MED ORDER — ROCURONIUM BROMIDE 100 MG/10ML IV SOLN
INTRAVENOUS | Status: DC | PRN
  Administered 2012-01-21: 40 mg via INTRAVENOUS
  Administered 2012-01-21: 10 mg via INTRAVENOUS

## 2012-01-21 MED ORDER — HEPARIN SODIUM (PORCINE) 5000 UNIT/ML IJ SOLN
5000.0000 [IU] | Freq: Three times a day (TID) | INTRAMUSCULAR | Status: DC
  Administered 2012-01-21 – 2012-01-22 (×2): 5000 [IU] via SUBCUTANEOUS
  Filled 2012-01-21 (×5): qty 1

## 2012-01-21 MED ORDER — KCL IN DEXTROSE-NACL 20-5-0.45 MEQ/L-%-% IV SOLN
INTRAVENOUS | Status: DC
  Administered 2012-01-21 – 2012-01-22 (×2): via INTRAVENOUS
  Filled 2012-01-21 (×2): qty 1000

## 2012-01-21 MED ORDER — MEPERIDINE HCL 50 MG/ML IJ SOLN: 6.2500 mg | INTRAMUSCULAR | Status: DC | PRN

## 2012-01-21 MED ORDER — MORPHINE SULFATE 2 MG/ML IJ SOLN
2.0000 mg | INTRAMUSCULAR | Status: DC | PRN
  Administered 2012-01-21 (×2): 4 mg via INTRAVENOUS
  Filled 2012-01-21 (×2): qty 2

## 2012-01-21 MED ORDER — BUPROPION HCL ER (SR) 100 MG PO TB12
100.0000 mg | ORAL_TABLET | Freq: Two times a day (BID) | ORAL | Status: DC
  Administered 2012-01-21 – 2012-01-22 (×2): 100 mg via ORAL
  Filled 2012-01-21 (×3): qty 1

## 2012-01-21 MED ORDER — ACETAMINOPHEN 160 MG/5ML PO SOLN: 650.0000 mg | ORAL | Status: DC | PRN

## 2012-01-21 MED ORDER — ONDANSETRON HCL 4 MG/2ML IJ SOLN
4.0000 mg | INTRAMUSCULAR | Status: DC | PRN
  Administered 2012-01-21: 4 mg via INTRAVENOUS
  Filled 2012-01-21: qty 2

## 2012-01-21 MED ORDER — LACTATED RINGERS IV SOLN: INTRAVENOUS | Status: DC

## 2012-01-21 MED ORDER — ALBUTEROL SULFATE HFA 108 (90 BASE) MCG/ACT IN AERS
2.0000 | INHALATION_SPRAY | Freq: Four times a day (QID) | RESPIRATORY_TRACT | Status: DC | PRN
  Filled 2012-01-21: qty 6.7

## 2012-01-21 MED ORDER — DEXTROSE 5 % IV SOLN
2.0000 g | INTRAVENOUS | Status: AC
  Administered 2012-01-21: 2 g via INTRAVENOUS

## 2012-01-21 MED ORDER — UNJURY CHICKEN SOUP POWDER: 2.0000 [oz_av] | Freq: Four times a day (QID) | ORAL | Status: DC

## 2012-01-21 MED ORDER — LACTATED RINGERS IV SOLN
INTRAVENOUS | Status: DC
  Administered 2012-01-21: 1000 mL via INTRAVENOUS
  Administered 2012-01-21: 11:00:00 via INTRAVENOUS

## 2012-01-21 MED ORDER — LIDOCAINE HCL (CARDIAC) 20 MG/ML IV SOLN
INTRAVENOUS | Status: DC | PRN
  Administered 2012-01-21: 100 mg via INTRAVENOUS

## 2012-01-21 MED ORDER — FENTANYL CITRATE 0.05 MG/ML IJ SOLN
INTRAMUSCULAR | Status: DC | PRN
  Administered 2012-01-21 (×2): 50 ug via INTRAVENOUS
  Administered 2012-01-21: 100 ug via INTRAVENOUS
  Administered 2012-01-21: 50 ug via INTRAVENOUS

## 2012-01-21 MED ORDER — FUROSEMIDE 40 MG PO TABS
40.0000 mg | ORAL_TABLET | Freq: Every day | ORAL | Status: DC | PRN
  Filled 2012-01-21: qty 2

## 2012-01-21 MED ORDER — PROMETHAZINE HCL 25 MG/ML IJ SOLN: 6.2500 mg | INTRAMUSCULAR | Status: DC | PRN

## 2012-01-21 MED ORDER — ALPRAZOLAM 1 MG PO TABS
1.0000 mg | ORAL_TABLET | Freq: Three times a day (TID) | ORAL | Status: DC | PRN
  Administered 2012-01-22: 1 mg via ORAL
  Filled 2012-01-21: qty 1

## 2012-01-21 MED ORDER — SUCCINYLCHOLINE CHLORIDE 20 MG/ML IJ SOLN
INTRAMUSCULAR | Status: DC | PRN
  Administered 2012-01-21: 200 mg via INTRAVENOUS

## 2012-01-21 MED ORDER — NEOSTIGMINE METHYLSULFATE 1 MG/ML IJ SOLN
INTRAMUSCULAR | Status: DC | PRN
  Administered 2012-01-21: 5 mg via INTRAVENOUS

## 2012-01-21 MED ORDER — OXYCODONE-ACETAMINOPHEN 5-325 MG/5ML PO SOLN: 5.0000 mL | ORAL | Status: DC | PRN

## 2012-01-21 MED ORDER — HEPARIN SODIUM (PORCINE) 5000 UNIT/ML IJ SOLN
5000.0000 [IU] | INTRAMUSCULAR | Status: AC
  Administered 2012-01-21: 5000 [IU] via SUBCUTANEOUS
  Filled 2012-01-21: qty 1

## 2012-01-21 MED ORDER — PROPOFOL 10 MG/ML IV BOLUS
INTRAVENOUS | Status: DC | PRN
  Administered 2012-01-21: 200 mg via INTRAVENOUS

## 2012-01-21 MED ORDER — PAROXETINE HCL 10 MG PO TABS
10.0000 mg | ORAL_TABLET | Freq: Every day | ORAL | Status: DC
  Administered 2012-01-22: 10 mg via ORAL
  Filled 2012-01-21 (×2): qty 1

## 2012-01-21 SURGICAL SUPPLY — 65 items
ADH SKN CLS APL DERMABOND .7 (GAUZE/BANDAGES/DRESSINGS) ×4
APL SKNCLS STERI-STRIP NONHPOA (GAUZE/BANDAGES/DRESSINGS)
BAND LAP 10.0 W/TUBES (Band) ×3 IMPLANT
BENZOIN TINCTURE PRP APPL 2/3 (GAUZE/BANDAGES/DRESSINGS) IMPLANT
BLADE HEX COATED 2.75 (ELECTRODE) ×3 IMPLANT
BLADE SURG 15 STRL LF DISP TIS (BLADE) ×2 IMPLANT
BLADE SURG 15 STRL SS (BLADE) ×3
CANISTER SUCTION 2500CC (MISCELLANEOUS) IMPLANT
CLOTH BEACON ORANGE TIMEOUT ST (SAFETY) ×3 IMPLANT
COVER SURGICAL LIGHT HANDLE (MISCELLANEOUS) ×1 IMPLANT
DECANTER SPIKE VIAL GLASS SM (MISCELLANEOUS) ×6 IMPLANT
DERMABOND ADVANCED (GAUZE/BANDAGES/DRESSINGS) ×2
DERMABOND ADVANCED .7 DNX12 (GAUZE/BANDAGES/DRESSINGS) ×2 IMPLANT
DEVICE SUT QUICK LOAD TK 5 (STAPLE) ×9 IMPLANT
DEVICE SUT TI-KNOT TK 5X26 (MISCELLANEOUS) ×3 IMPLANT
DEVICE SUTURE ENDOST 10MM (ENDOMECHANICALS) IMPLANT
DISSECTOR BLUNT TIP ENDO 5MM (MISCELLANEOUS) IMPLANT
DRAPE CAMERA CLOSED 9X96 (DRAPES) ×3 IMPLANT
ELECT REM PT RETURN 9FT ADLT (ELECTROSURGICAL) ×3
ELECTRODE REM PT RTRN 9FT ADLT (ELECTROSURGICAL) ×2 IMPLANT
GLOVE BIOGEL M 8.0 STRL (GLOVE) ×3 IMPLANT
GLOVE BIOGEL PI IND STRL 7.0 (GLOVE) ×2 IMPLANT
GLOVE BIOGEL PI INDICATOR 7.0 (GLOVE) ×1
GOWN STRL NON-REIN LRG LVL3 (GOWN DISPOSABLE) ×3 IMPLANT
GOWN STRL REIN XL XLG (GOWN DISPOSABLE) ×6 IMPLANT
HOVERMATT SINGLE USE (MISCELLANEOUS) ×3 IMPLANT
KIT BASIN OR (CUSTOM PROCEDURE TRAY) ×3 IMPLANT
MESH HERNIA 1X4 RECT BARD (Mesh General) IMPLANT
MESH HERNIA BARD 1X4 (Mesh General) ×1 IMPLANT
NDL SPNL 22GX3.5 QUINCKE BK (NEEDLE) ×2 IMPLANT
NEEDLE SPNL 22GX3.5 QUINCKE BK (NEEDLE) ×3 IMPLANT
NS IRRIG 1000ML POUR BTL (IV SOLUTION) ×3 IMPLANT
PACK UNIVERSAL I (CUSTOM PROCEDURE TRAY) ×3 IMPLANT
PENCIL BUTTON HOLSTER BLD 10FT (ELECTRODE) ×3 IMPLANT
SCALPEL HARMONIC ACE (MISCELLANEOUS) IMPLANT
SET IRRIG TUBING LAPAROSCOPIC (IRRIGATION / IRRIGATOR) IMPLANT
SHEARS CURVED HARMONIC AC 45CM (MISCELLANEOUS) IMPLANT
SLEEVE ADV FIXATION 5X100MM (TROCAR) IMPLANT
SLEEVE Z-THREAD 5X100MM (TROCAR) IMPLANT
SOLUTION ANTI FOG 6CC (MISCELLANEOUS) ×3 IMPLANT
SPONGE GAUZE 4X4 12PLY (GAUZE/BANDAGES/DRESSINGS) ×3 IMPLANT
SPONGE LAP 18X18 X RAY DECT (DISPOSABLE) ×3 IMPLANT
STAPLER VISISTAT 35W (STAPLE) ×3 IMPLANT
STRIP CLOSURE SKIN 1/2X4 (GAUZE/BANDAGES/DRESSINGS) IMPLANT
SUT ETHIBOND 2 0 SH (SUTURE) ×9
SUT ETHIBOND 2 0 SH 36X2 (SUTURE) ×6 IMPLANT
SUT PROLENE 2 0 CT2 30 (SUTURE) ×3 IMPLANT
SUT SILK 0 (SUTURE)
SUT SILK 0 30XBRD TIE 6 (SUTURE) IMPLANT
SUT SURGIDAC NAB ES-9 0 48 120 (SUTURE) IMPLANT
SUT VIC AB 2-0 SH 27 (SUTURE)
SUT VIC AB 2-0 SH 27X BRD (SUTURE) IMPLANT
SUT VIC AB 4-0 SH 18 (SUTURE) ×3 IMPLANT
SYR 20CC LL (SYRINGE) ×3 IMPLANT
SYR 30ML LL (SYRINGE) ×3 IMPLANT
SYS KII OPTICAL ACCESS 15MM (TROCAR) ×3
SYSTEM KII OPTICAL ACCESS 15MM (TROCAR) ×2 IMPLANT
TOWEL OR 17X26 10 PK STRL BLUE (TOWEL DISPOSABLE) ×6 IMPLANT
TROCAR ADV FIXATION 11X100MM (TROCAR) IMPLANT
TROCAR XCEL NON-BLD 11X100MML (ENDOMECHANICALS) ×3 IMPLANT
TROCAR Z-THREAD FIOS 11X100 BL (TROCAR) IMPLANT
TROCAR Z-THREAD FIOS 5X100MM (TROCAR) ×3 IMPLANT
TROCAR Z-THREAD SLEEVE 11X100 (TROCAR) IMPLANT
TUBE CALIBRATION LAPBAND (TUBING) ×3 IMPLANT
TUBING INSUFFLATION 10FT LAP (TUBING) ×3 IMPLANT

## 2012-01-21 NOTE — Anesthesia Postprocedure Evaluation (Signed)
  Anesthesia Post-op Note  Patient: Stephanie Harper  Procedure(s) Performed: Procedure(s) (LRB): LAPAROSCOPIC GASTRIC BANDING (N/A) MESH APPLIED TO LAP PORT ()  Patient Location: PACU  Anesthesia Type: General  Level of Consciousness: awake and alert   Airway and Oxygen Therapy: Patient Spontanous Breathing  Post-op Pain: mild  Post-op Assessment: Post-op Vital signs reviewed, Patient's Cardiovascular Status Stable, Respiratory Function Stable, Patent Airway and No signs of Nausea or vomiting  Last Vitals:  Filed Vitals:   01/21/12 1400  BP: 117/72  Pulse: 60  Temp: 36.6 C  Resp: 12    Post-op Vital Signs: stable   Complications: No apparent anesthesia complications

## 2012-01-21 NOTE — Progress Notes (Signed)
Dr. Acey Lav made aware of patient's complaints of sternal discomfort when she takes a deep breath in- Orders given.

## 2012-01-21 NOTE — Transfer of Care (Signed)
Immediate Anesthesia Transfer of Care Note  Patient: Stephanie Harper  Procedure(s) Performed: Procedure(s) (LRB) with comments: LAPAROSCOPIC GASTRIC BANDING (N/A) MESH APPLIED TO LAP PORT ()  Patient Location: PACU  Anesthesia Type:General  Level of Consciousness: awake, alert  and oriented  Airway & Oxygen Therapy: Patient Spontanous Breathing and Patient connected to face mask oxygen  Post-op Assessment: Report given to PACU RN and Post -op Vital signs reviewed and stable  Post vital signs: Reviewed and stable  Complications: No apparent anesthesia complications

## 2012-01-21 NOTE — H&P (View-Only) (Signed)
Chief Complaint:  Morbid obesity BMI 61    History of Present Illness:  Stephanie Harper is an 42 y.o. 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  She is trying to adjust her schedule and food intake.  She had an UGI that didn't show a hiatus hernia.  She has had lap chole and lap tubal.   Past Medical History  Diagnosis Date  . Depression   . LBP (low back pain)   . Asthma   . Anxiety   . HTN (hypertension)   . Obesity   . Anal fissure 2012    Dr Jacobs  . Osteomyelitis     at [facility]  . Shoulder fracture, left   . Baker's cyst of knee     Right knee  . Sepsis(995.91) 2005    From shoulder replacement surgery    Past Surgical History  Procedure Date  . Total shoulder replacement     Right  . Breath tek h pylori 07/15/2011    Procedure: BREATH TEK H PYLORI;  Surgeon: Journee Kohen B Kanaan Kagawa, MD;  Location: [facility];  Service: General;  Laterality: N/A;  . Tubal ligation   . Laser ablation   . Cholecystectomy   . Appendectomy   . Tonsillectomy     Current Outpatient Prescriptions  Medication Sig Dispense Refill  . albuterol (VENTOLIN HFA) 108 (90 BASE) MCG/ACT inhaler Inhale 2 puffs into the lungs every 6 (six) hours as needed for wheezing.  18 each  1  . ALPRAZolam (XANAX) 1 MG tablet TAKE 1 TABLET BY MOUTH 3 TIMES A DAY AS NEEDED SLEEP OR ANXIETY  270 tablet  2  . buPROPion (WELLBUTRIN SR) 100 MG 12 hr tablet Take 1 tablet (100 mg total) by mouth 2 (two) times daily.  180 tablet  2  . Cholecalciferol 1000 UNITS tablet Take 1,000 Units by mouth daily.        . furosemide (LASIX) 40 MG tablet Take 1-2 tablets (40-80 mg total) by mouth daily as needed.  30 tablet  11  . HYDROcodone-acetaminophen (VICODIN) 5-500 MG per tablet Take 1-2 tablets by mouth 4 (four) times daily as needed for pain.  140 tablet  1  . losartan (COZAAR) 100 MG tablet Take 100 mg by mouth daily before breakfast.      . nabumetone (RELAFEN) 750 MG tablet Take 1 tablet (750 mg total) by  mouth 2 (two) times daily as needed for pain.  60 tablet  3  . PARoxetine (PAXIL) 10 MG tablet Take 10 mg by mouth daily before breakfast.      . potassium chloride (KLOR-CON 10) 10 MEQ tablet Take 1 tablet (10 mEq total) by mouth daily as needed (take w/Lasix).  30 tablet  11  . vitamin B-12 (CYANOCOBALAMIN) 1000 MCG tablet Take 1,000 mcg by mouth daily.         Adhesive Family History  Problem Relation Age of Onset  . Coronary artery disease Other   . Hyperlipidemia Other   . Hypertension Other   . Arthritis Mother   . Diabetes Mother   . Hyperlipidemia Mother   . Hypertension Mother   . Arthritis Father   . Diabetes Father   . Hyperlipidemia Father   . Hypertension Father   . Heart disease Father    Social History:   reports that she has been smoking Cigarettes.  She has a 15.5 pack-year smoking history. She does not have any smokeless tobacco history   on file. She reports that she does not drink alcohol or use illicit drugs.   REVIEW OF SYSTEMS - PERTINENT POSITIVES ONLY: No history of DVT  Physical Exam:   Blood pressure 136/88, pulse 92, temperature 98.2 F (36.8 C), temperature source Temporal, resp. rate 24, height 5' 7.5" (1.715 m), weight 397 lb (180.078 kg). Body mass index is 61.26 kg/(m^2).  Gen:  WDWN WF NAD  Neurological: Alert and oriented to person, place, and time. Motor and sensory function is grossly intact  Head: Normocephalic and atraumatic.  Eyes: Conjunctivae are normal. Pupils are equal, round, and reactive to light. No scleral icterus.  Neck: Normal range of motion. Neck supple. No tracheal deviation or thyromegaly present.  Cardiovascular:  SR without murmurs or gallops.  No carotid bruits Respiratory: Effort normal.  No respiratory distress. No chest wall tenderness. Breath sounds normal.  No wheezes, rales or rhonchi.  Abdomen: obese but nontender. GU: Musculoskeletal: Normal range of motion. Extremities are nontender. No cyanosis, edema or  clubbing noted Lymphadenopathy: No cervical, preauricular, postauricular or axillary adenopathy is present Skin: Skin is warm and dry. No rash noted. No diaphoresis. No erythema. No pallor. Pscyh: Normal mood and affect. Behavior is normal. Judgment and thought content normal.   LABORATORY RESULTS: No results found for this or any previous visit (from the past 48 hour(s)).  RADIOLOGY RESULTS: No results found.  Problem List: Patient Active Problem List  Diagnosis  . OBESITY, MORBID  . ANXIETY  . PANIC ATTACK  . TOBACCO USE DISORDER/SMOKER-SMOKING CESSATION DISCUSSED  . DEPRESSION  . HYPERTENSION  . UPPER RESPIRATORY INFECTION, ACUTE  . ASTHMA  . CONTACT DERMATITIS  . SHOULDER PAIN  . ELBOW PAIN  . LOW BACK PAIN  . ANAL OR RECTAL PAIN  . HEMATOCHEZIA  . Night sweats  . Arthralgia    Assessment & Plan: Morbid obesity  We reviewed the permit for lap band. She is scheduled January 14. She is nervous and excited and seems fairly good fund of knowledge about what she is getting into. She is begun keeping a food journal and avoiding carbohydrates.    Matt B. Lexee Brashears, MD, FACS  [facility] [phone] beeper [phone]  01/10/2012 11:08 AM     

## 2012-01-21 NOTE — Op Note (Signed)
01/21/2012  Surgeon: Wenda Low, MD, FACS Asst:  Lodema Pilot, DO  Procedure: Laparoscopic adjustable gastric banding with APS band  Anes:  General  EBL:  Minimal  Description of Procedure  The patient was taken to OR # 1 and given general anesthesia.  After a prep with PCMX the patient was draped and a timeout performed.  Access to the abdomen was achieved with a 0 degree Optiview technique through the left upper quadrant.    Adhesions were minimal.  Ports were placed to the the right of the midline including a 15 trocar in  the right upper quadrant placed obliquely.  The Satira Mccallum was used to retract the left lateral segment and the peritoneum was incised along the left crus.   The EJ junction as assessed for a hiatus hernia and none was seen.  A balloon test was not performed.  UGI had been negative.    The pars flaccida technique was utilized to insert the blunt "finger " dissector from right to left behind the stomach.  This created a target zone to pass the band passer.     The lapband APS  Had been previously flushed and was inserted through the 15 trocar.  It was placed in the tip of "the finger"  and pulled around behind the stomach.   The band was plicated with 3 sutures of surgidec secured with Ty Knots.  The tubing was brought out through the lower incision on the right and connected to the port which had mesh sewn onto the back and was placed into the subcutaneous pocket.  The incisions were injected with Exparel and closed with 4-0 vicryl and Dermabond.     The patient was taken to the PACU in stable condition.    Matt B. Daphine Deutscher, MD, [facility], [place] [phone]

## 2012-01-21 NOTE — Anesthesia Preprocedure Evaluation (Addendum)
Anesthesia Evaluation    History of Anesthesia Complications Negative for: history of anesthetic complications  Airway Mallampati: I TM Distance: >3 FB Neck ROM: Full    Dental No notable dental hx.    Pulmonary asthma ,  breath sounds clear to auscultation  Pulmonary exam normal       Cardiovascular hypertension, Pt. on medications Rhythm:Regular Rate:Normal     Neuro/Psych PSYCHIATRIC DISORDERS Anxiety Depression    GI/Hepatic   Endo/Other  Morbid obesity  Renal/GU      Musculoskeletal   Abdominal   Peds  Hematology   Anesthesia Other Findings   Reproductive/Obstetrics                          Anesthesia Physical Anesthesia Plan  ASA: III  Anesthesia Plan: General   Post-op Pain Management:    Induction: Intravenous  Airway Management Planned: Oral ETT  Additional Equipment:   Intra-op Plan:   Post-operative Plan: Extubation in OR  Informed Consent: I have reviewed the patients History and Physical, chart, labs and discussed the procedure including the risks, benefits and alternatives for the proposed anesthesia with the patient or authorized representative who has indicated his/her understanding and acceptance.   Dental advisory given  Plan Discussed with: CRNA  Anesthesia Plan Comments: (Pt does NOT want to be strapped down while awake. I assured her we would induce prior to securing her arms)        Anesthesia Quick Evaluation

## 2012-01-21 NOTE — Progress Notes (Signed)
12 LEAD EKG DONE.- shown to Dr. Acey Lav- O.K. To go to floor

## 2012-01-21 NOTE — Interval H&P Note (Signed)
History and Physical Interval Note:  01/21/2012 10:34 AM  Stephanie Harper  has presented today for surgery, with the diagnosis of morbid obesity   The various methods of treatment have been discussed with the patient and family. After consideration of risks, benefits and other options for treatment, the patient has consented to  Procedure(s) (LRB) with comments: LAPAROSCOPIC GASTRIC BANDING (N/A) as a surgical intervention .  The patient's history has been reviewed, patient examined, no change in status, stable for surgery.  I have reviewed the patient's chart and labs.  Questions were answered to the patient's satisfaction.     Stephanie Harper B

## 2012-01-22 ENCOUNTER — Telehealth (INDEPENDENT_AMBULATORY_CARE_PROVIDER_SITE_OTHER): Payer: Self-pay | Admitting: General Surgery

## 2012-01-22 ENCOUNTER — Encounter (HOSPITAL_COMMUNITY): Payer: Self-pay | Admitting: Surgery

## 2012-01-22 ENCOUNTER — Observation Stay (HOSPITAL_COMMUNITY): Payer: Managed Care, Other (non HMO)

## 2012-01-22 DIAGNOSIS — Z6841 Body Mass Index (BMI) 40.0 and over, adult: Secondary | ICD-10-CM | POA: Diagnosis not present

## 2012-01-22 DIAGNOSIS — J45909 Unspecified asthma, uncomplicated: Secondary | ICD-10-CM | POA: Diagnosis not present

## 2012-01-22 DIAGNOSIS — F17200 Nicotine dependence, unspecified, uncomplicated: Secondary | ICD-10-CM | POA: Diagnosis not present

## 2012-01-22 DIAGNOSIS — I10 Essential (primary) hypertension: Secondary | ICD-10-CM | POA: Diagnosis not present

## 2012-01-22 DIAGNOSIS — Z9884 Bariatric surgery status: Secondary | ICD-10-CM | POA: Diagnosis not present

## 2012-01-22 DIAGNOSIS — Z01812 Encounter for preprocedural laboratory examination: Secondary | ICD-10-CM | POA: Diagnosis not present

## 2012-01-22 DIAGNOSIS — R198 Other specified symptoms and signs involving the digestive system and abdomen: Secondary | ICD-10-CM | POA: Diagnosis not present

## 2012-01-22 DIAGNOSIS — Z09 Encounter for follow-up examination after completed treatment for conditions other than malignant neoplasm: Secondary | ICD-10-CM | POA: Diagnosis not present

## 2012-01-22 LAB — CBC WITH DIFFERENTIAL/PLATELET
Basophils Absolute: 0 10*3/uL (ref 0.0–0.1)
Eosinophils Relative: 1 % (ref 0–5)
HCT: 38.6 % (ref 36.0–46.0)
Hemoglobin: 12.7 g/dL (ref 12.0–15.0)
Lymphocytes Relative: 20 % (ref 12–46)
MCHC: 32.9 g/dL (ref 30.0–36.0)
MCV: 89.1 fL (ref 78.0–100.0)
Monocytes Absolute: 1 10*3/uL (ref 0.1–1.0)
Monocytes Relative: 10 % (ref 3–12)
RDW: 13.9 % (ref 11.5–15.5)
WBC: 10.1 10*3/uL (ref 4.0–10.5)

## 2012-01-22 NOTE — Progress Notes (Signed)
Patient is alert and oriented up ambulating in the hallway with mother.  Patient is using incentive spirometer.  Patient denies nausea or vomiting.  Patient is burping, passing gas, denies bowel movement.  Patient is experiencing minimal abdominal discomfort that is relieved with prn medication.  Patient educated on the importance of wearing compression hose.  Patient has follow up appointments  with  [facility] and [facility].  Dopplers studies and abdominal xray negative, patient tolerating POD #1 diet.  Patient is aware of the BELT program and hospital support group.  The following discharge instructions listed below discussed with patient and mother.    Japneet Staggs, Rn   ADJUSTABLE GASTRIC BAND DISCHARGE INSTRUCTIONS  Drs. Fredrik Rigger, [place], [place], and [place] Call if you have any problems.   Call [phone] and ask for the surgeon on call.    If you need immediate assistance come to the ER at [facility]. Tell the ER personnel that you are a new post-op gastric banding patient. Signs and symptoms to report:   Severe vomiting or nausea. If you cannot tolerate clear liquids for longer than 1 day, you need to call your surgeon.    Abdominal pain which does not get better after taking your pain medication   Fever greater than 101 F degree   Difficulty breathing   Chest pain    Redness, swelling, drainage, or foul odor at incision sites    If your incisions open or pull apart   Swelling or pain in calf (lower leg)   Diarrhea, frequent watery, uncontrolled bowel movements.   Constipation, (no bowel movements for 3 days) if this occurs, Take Milk of Magnesia, 2 tablespoons by mouth, 3 times a day for 2 days if needed.  Call your doctor if constipation continues. Stop taking Milk of Magnesia once you have had a bowel movement. You may also use Miralax according to the label instructions.   Anything you consider "abnormal for you".   Normal side effects after Surgery:   Unable to sleep at night or  concentrate   Irritability   Being tearful (crying) or depressed   These are common complaints, possibly related to your anesthesia, stress of surgery and change in lifestyle, that usually go away a few weeks after surgery.  If these feelings continue, call your medical doctor.  Wound Care You may have surgical glue, steri-strips, or staples over your incisions after surgery.  Surgical glue:  Looks like a clear film over your incisions and will wear off gradually. Steri-strips: Strips of tape over your incisions. You may notice a yellowish color on the skin underneath the steri-strips. This is a substance used to make the steri-strips stick better. Do not pull the steri-strips off - let them fall off. Staples: Cherlynn Polo may be removed before you leave the hospital. If you go home with staples, call [facility] [phone]) for an appointment with your surgeon's nurse to have staples removed in 7 - 10 days. Showering: You may shower two days after your surgery unless otherwise instructed by your surgeon. Wash gently around wounds with warm soapy water, rinse well, and gently pat dry.  If you have a drain, you may need someone to hold this while you shower. Avoid tub baths until staples are removed and incisions are healed.    Medications   Medications should be liquid or crushed if larger than the size of a dime.  Extended release pills should not be crushed.   Depending on the size and number  of medications you take, you may need to stagger/change the time you take your medications so that you do not over-fill your pouch.    Make sure you follow-up with your primary care physician to make medication adjustments needed during rapid weight loss and life-style adjustment.   If you are diabetic, follow up with the doctor that prescribes your diabetes medication(s) within one week after surgery and check your blood sugar regularly.   Do not drive while taking narcotics!   Do not take  acetaminophen (Tylenol) and Roxicet or Lortab Elixir at the same time since these pain medications contain acetaminophen.  Diet at home: (First 2 Weeks)  You will see the nutritionist two weeks after your surgery. She will advance your diet if you are tolerating liquids well. Once at home, if you have severe vomiting or nausea and cannot tolerate clear liquids lasting longer than 1 day, call your surgeon.  For Same Day Surgery Discharge Patients: The day of surgery drink water only: 2 ounces every 4 hours. If you are tolerating water, begin drinking your high protein shake the next morning. For Overnight Stay Patients: Begin high protein shake 2 ounces every 3 hours, 5 - 6 times per day.  Gradually increase the amount you drink as tolerated.  You may find it easier to slowly sip shakes throughout the day.  It is important to get your proteins in first.   Protein Shake   Drink at least 2 ounces of shake 5-6 times per day   Each serving of protein shakes should have a minimum of 15 grams of protein and no more than 5 grams of carbohydrate    Increase the amount of protein shake you drink as tolerated   Protein powder may be added to fluids such as non-fat milk or Lactaid milk (limit to 20 grams added protein powder per serving   The initial goal is to drink at least 8 ounces of protein shake/drink per day (or as directed by the nutritionist). Some examples of protein shakes are [employer], [employer]illard[employer], EAS Edge HP, and Unjury. Hydration   Gradually increase the amount of water and other liquids as tolerated (See Acceptable Fluids)   Gradually increase the amount of protein shake as tolerated     Sip fluids slowly and throughout the day   May use Sugar substitutes, use sparingly (limit to 6 - 8 packets per day).  Your fluid goal is 64 ounces of fluid daily. It may take a few weeks to build up to this.         32 oz (or more) should be clear liquids and 32 oz (or more) should be full  liquids.         Liquids should not contain sugar, caffeine, or carbonation!  Acceptable Fluids Clear Liquids:   Water or Sugar-free flavored water, Fruit H2O   Decaffeinated coffee or tea (sugar-free)   Crystal Lite, Wyler's Lite, Minute Maid Lite   Sugar-free Jell-O   Bouillon or broth   Sugar-free Popsicle:   *Less than 20 calories each; Limit 1 per day   Full Liquids:              Protein Shakes/Drinks + 2 choices per day of other full liquids shown below.    Other full liquids must be: No more than 12 grams of Carbs per serving,  No more than 3 grams of Fat per serving   Strained low-fat cream soup   Non-Fat milk   Fat-free  Lactaid Milk   Sugar-free yogurt (Dannon Lite & Fit) Vitamins and Minerals (Start 1 day after surgery unless otherwise directed)   1 Chewable Multivitamin / Multimineral Supplement (i.e. Centrum for Adults)   Chewable Calcium Citrate with Vitamin D-3. Take 1500 mg each day.           (Example: 3 Chewable Calcium Plus 600 with Vitamin D-3 can be found at [facility])           Do not mix multivitamins containing iron with calcium supplements; take 2 hours   apart   Do not substitute Tums (calcium carbonate) for your calcium   Menstruating women and those at risk for anemia may need extra iron. Talk with your doctor to see if you need additional iron.     If you need extra iron:  Total daily Iron recommendations (including Vitamins) = 50 - 100 mg Iron/day Do not stop taking or change any vitamins or minerals until you talk to your nutritionist or surgeon. Your nutritionist and / or physician must approve all vitamin and mineral supplements. Exercise For maximum success, begin exercising as soon as your doctor recommends. Make sure your physician approves any physical activity.   Depending on fitness level, begin with a simple walking program   Walk 5-15 minutes each day, 7 days per week.    Slowly increase until you are walking 30-45 minutes per day   Consider  joining our BELT program. [phone] or email [email or web address]@[employer] .edu Things to remember:   You may have sexual relations when you feel comfortable. It is VERY important for female patients to use a reliable birth control method. Fertility often increases after surgery. Do not get pregnant for at least 18 months.   It is very important to keep all follow up appointments with your surgeon, nutritionist, primary care physician, and behavioral health practitioner. After the first year, please follow up with your bariatric surgeon at least once a year in order to maintain best weight loss results.  [facility]: [phone] [facility]: [phone]   Free counseling is available for you and your family through collaboration between [facility] and [facility]. Please call [phone] and leave a message.    Consider purchasing a medical alert bracelet that says you had lap-band surgery.    [facility] has a free Bariatric Surgery Support Group that meets monthly, the 3rd Thursday, 6 pm, Classroom #1, [place]. You may register online at [email or web address], but registration is not necessary. Select Classes and Support Groups, Bariatric Surgery, or Call [phone]   Do not return to work or drive until cleared by your surgeon   Use your CPAP when sleeping if applicable   Do not lift anything greater than ten pounds for at least two weeks.   You will probably have your first fill (fluid added to your band) 6 weeks after surgery

## 2012-01-22 NOTE — Progress Notes (Signed)
O2 level currently not being continuously monitored due to patient disconnect. Hazen Brumett RN

## 2012-01-22 NOTE — Telephone Encounter (Signed)
Tech at the Vascular Lab called report for Dr. Daphine Deutscher on this pt: bilaterally NEG for DVT.  Paged and updated Dr. Daphine Deutscher.

## 2012-01-22 NOTE — Care Management Note (Signed)
    Page 1 of 1   01/22/2012     11:08:48 AM   CARE MANAGEMENT NOTE 01/22/2012  Patient:  Stephanie Harper, Stephanie Harper   Account Number:  [id number]  Date Initiated:  01/22/2012  Documentation initiated by:  Lorenda Ishihara  Subjective/Objective Assessment:   42 yo female admitted s/p lap band. PTA lived at home with spouse.     Action/Plan:   Home when stable   Anticipated DC Date:  01/22/2012   Anticipated DC Plan:  HOME/SELF CARE      DC Planning Services  CM consult      Choice offered to / List presented to:             Status of service:  Completed, signed off Medicare Important Message given?   (If response is "NO", the following Medicare IM given date fields will be blank) Date Medicare IM given:   Date Additional Medicare IM given:    Discharge Disposition:  HOME/SELF CARE  Per UR Regulation:  Reviewed for med. necessity/level of care/duration of stay  If discussed at Long Length of Stay Meetings, dates discussed:    Comments:

## 2012-01-22 NOTE — Progress Notes (Addendum)
*  PRELIMINARY RESULTS* Vascular Ultrasound Lower extremity venous duplex has been completed.  Preliminary findings: Bilateral:  No evidence of DVT, superficial thrombosis, or Baker's Cyst.  Called report to Debbe Odea, RN.   Farrel Demark, RDMS, RVT 01/22/2012, 8:40 AM

## 2012-01-22 NOTE — Progress Notes (Signed)
Pt complain of o2 sat monitor beeping every time she falls asleep requested for me to disconnect o2 sat monitor advised patient I could not disconnect the monitor and explained the benefits of the monitor. Patient upset and disconnected the monitor stated she understood the need for it and understood why monitor could not be disconnected but could not sleep with it on. Stephanie Campillo RN

## 2012-01-22 NOTE — Discharge Summary (Signed)
Physician Discharge Summary  Patient ID: Stephanie Harper MRN: [id number] DOB/AGE: [birth year] 41 y.o.  Admit date: 01/21/2012 Discharge date: 01/22/2012  Admission Diagnoses:  Morbid obesity BMI 60  Discharge Diagnoses:  Same, post lapband  Active Problems:  Lapband APS Jan 2014   Surgery:  Lapband APS  Discharged Condition: good  Hospital Course:   Had surgery.  Did well.  DVT study OK.    Consults: none  Significant Diagnostic Studies: non    Discharge Exam: Blood pressure 111/72, pulse 79, temperature 97.8 F (36.6 C), temperature source Oral, resp. rate 18, height 5\' 7"  (1.702 m), weight 387 lb 8 oz (175.769 kg), SpO2 98.00%. having minimal pain.  Ready for discharge.  Disposition: 01-Home or Self Care  Discharge Orders    Future Appointments: Provider: Department: Dept Phone: Center:   02/04/2012 4:00 PM [facility]dm[facility] Post-Op Class [facility] [phone] NDM   02/06/2012 9:50 AM Valarie Merino, MD [facility] [place] [phone] None   03/04/2012 10:30 AM Tresa Garter, MD [facility] [phone] [facility]     Future Orders Please Complete By Expires   Increase activity slowly      Discharge instructions      Comments:   Follow bariatric diet       Medication List     As of 01/22/2012  1:22 PM    TAKE these medications         albuterol 108 (90 BASE) MCG/ACT inhaler   Commonly known as: PROVENTIL HFA;VENTOLIN HFA   Inhale 2 puffs into the lungs every 6 (six) hours as needed for wheezing.      ALPRAZolam 1 MG tablet   Commonly known as: XANAX   TAKE 1 TABLET BY MOUTH 3 TIMES A DAY AS NEEDED SLEEP OR ANXIETY      buPROPion 100 MG 12 hr tablet   Commonly known as: WELLBUTRIN SR   Take 1 tablet (100 mg total) by mouth 2 (two) times daily.      Cholecalciferol 1000 UNITS tablet   Take 1,000 Units by mouth daily.      furosemide 40 MG tablet   Commonly known as: LASIX   Take  40-80 mg by mouth daily as needed. FOR SWELLING IN LEGS IF NEEDED      HYDROcodone-acetaminophen 5-500 MG per tablet   Commonly known as: VICODIN   Take 1-2 tablets by mouth 4 (four) times daily as needed. SHOULDER AND / OR KNEE PAIN      losartan 100 MG tablet   Commonly known as: COZAAR   Take 100 mg by mouth daily before breakfast.      multivitamin tablet   Take 1 tablet by mouth daily.      nabumetone 750 MG tablet   Commonly known as: RELAFEN   Take 750 mg by mouth 2 (two) times daily.      PARoxetine 10 MG tablet   Commonly known as: PAXIL   Take 10 mg by mouth daily before breakfast.      potassium chloride 10 MEQ tablet   Commonly known as: K-DUR   Take 10 mEq by mouth daily as needed. ONLY TAKES IF LASIX TAKEN      vitamin B-12 1000 MCG tablet   Commonly known as: CYANOCOBALAMIN   Take 1,000 mcg by mouth daily.           Follow-up Information    Follow up with Valarie Merino, MD.   Contact  information:   [address] [place] [address] [phone]          Signed: Valarie Merino 01/22/2012, 1:22 PM

## 2012-01-23 ENCOUNTER — Telehealth: Payer: Self-pay | Admitting: Internal Medicine

## 2012-01-23 ENCOUNTER — Telehealth (INDEPENDENT_AMBULATORY_CARE_PROVIDER_SITE_OTHER): Payer: Self-pay | Admitting: General Surgery

## 2012-01-23 NOTE — Telephone Encounter (Signed)
Caller: Brandy/; Phone: [phone]; Reason for Call: Stephanie Harper, pharmacist calling today from [facility] [place] regarding prescription for Hydrocodone/acetaminophen 5/500 mg, pt is trying to refill.  These are no longer available.  Needs new script for 5/325 mg strength.  Pt had refills on current script it is just they do not make these any longer.  PLEASE CALL PHARMACY WITH NEW PRESCRIPTION FOR NEW DOSGAGE.  Thanks.

## 2012-01-23 NOTE — Telephone Encounter (Signed)
Pt declined offer of pain meds following her lap band surgery, but has reconsidered today.  Pain is more than she expected.  Will call Lortab elixir 7.5/500 mg per 15 ml, # 600 ml, 15 ml po Q4-6H prn pain, no refill to [facility]:  [phone].

## 2012-01-23 NOTE — Telephone Encounter (Addendum)
Patient called back and said Lortab elixir 7.5/325 was on back order at [facility] [place] and she did not want to wait for it. [facility] [address] and they have it in [facility]. They are filling this RX. RX cancelled at [facility] and patient made aware.

## 2012-01-24 NOTE — Telephone Encounter (Signed)
Ok Thx 

## 2012-01-31 ENCOUNTER — Encounter (INDEPENDENT_AMBULATORY_CARE_PROVIDER_SITE_OTHER): Payer: BC Managed Care – PPO | Admitting: Surgery

## 2012-02-01 ENCOUNTER — Other Ambulatory Visit: Payer: Self-pay | Admitting: Internal Medicine

## 2012-02-01 ENCOUNTER — Encounter (HOSPITAL_COMMUNITY): Payer: Self-pay | Admitting: Emergency Medicine

## 2012-02-01 ENCOUNTER — Emergency Department (INDEPENDENT_AMBULATORY_CARE_PROVIDER_SITE_OTHER)
Admission: EM | Admit: 2012-02-01 | Discharge: 2012-02-01 | Disposition: A | Discharge status: Home / Self Care | Payer: Managed Care, Other (non HMO) | Source: Home / Self Care | Attending: Family Medicine | Admitting: Family Medicine

## 2012-02-01 DIAGNOSIS — L259 Unspecified contact dermatitis, unspecified cause: Secondary | ICD-10-CM

## 2012-02-01 DIAGNOSIS — L239 Allergic contact dermatitis, unspecified cause: Secondary | ICD-10-CM

## 2012-02-01 MED ORDER — PREDNISONE 10 MG PO TABS: ORAL_TABLET | ORAL | Status: DC

## 2012-02-01 NOTE — ED Notes (Signed)
Questioned in [place]: denies any difficulty breathing or unusual throat sensations/swelling.

## 2012-02-01 NOTE — ED Provider Notes (Signed)
History     CSN: [id number]  Arrival date & time 02/01/12  1358   First MD Initiated Contact with Patient 02/01/12 1422      Chief Complaint  Patient presents with  . Allergic Reaction    allergic reaction adhesive. forearms, face and chest are red and inflammed and extremely itchy.     (Consider location/radiation/quality/duration/timing/severity/associated sxs/prior treatment) Patient is a 42 y.o. female presenting with allergic reaction. The history is provided by the patient. No language interpreter was used.  Allergic Reaction The primary symptoms are  rash. Episode onset: 2 weeks. The problem has not changed since onset.  Pt had lap band surgery.   Pt has rash everywhere from tape.  Pt also used a breath [employer] and now nose is broken out Past Medical History  Diagnosis Date  . Depression   . LBP (low back pain)   . Anxiety   . HTN (hypertension)   . Obesity   . Anal fissure 2012    Dr Christella Hartigan  . Osteomyelitis     at [facility]  . Shoulder fracture, left   . Baker's cyst of knee     Right knee  . Sepsis(995.91) 2005    From shoulder replacement surgery  . Asthma     COLD WEATHER RELATED  . Pain     HX OF BILATERAL SHOULDER SURGERIES-CHRONIC SHOULDER PAIN ;  HX OF BILATERAL KNEE PAIN  . Complication of anesthesia     PANIC ATTACK RIGHT BEFORE SHOULDER REPLACMENT SURGERY AT [facility]; PT STATES CLAUSTROPHOBIA AND DOES NOT WANT TO BE AWARE OF BEING STRAPPED DOWN IN OR    Past Surgical History  Procedure Date  . Total shoulder replacement     Right  . Breath tek h pylori 07/15/2011    Procedure: BREATH TEK H PYLORI;  Surgeon: Valarie Merino, MD;  Location: [facility];  Service: General;  Laterality: N/A;  . Laser ablation   . Cholecystectomy   . Appendectomy   . Tonsillectomy   . Tubal ligation     UTERINE ABLATION WAS DONE AT THE TIME OF TUBAL LIGATION  . Laparoscopic gastric banding 01/21/2012    Procedure: LAPAROSCOPIC GASTRIC BANDING;  Surgeon: Valarie Merino,  MD;  Location: [facility];  Service: General;  Laterality: N/A;  . Mesh applied to lap port 01/21/2012    Procedure: MESH APPLIED TO LAP PORT;  Surgeon: Valarie Merino, MD;  Location: [facility];  Service: General;;    Family History  Problem Relation Age of Onset  . Coronary artery disease Other   . Hyperlipidemia Other   . Hypertension Other   . Arthritis Mother   . Diabetes Mother   . Hyperlipidemia Mother   . Hypertension Mother   . Arthritis Father   . Diabetes Father   . Hyperlipidemia Father   . Hypertension Father   . Heart disease Father     History  Substance Use Topics  . Smoking status: Current Every Day Smoker -- 0.5 packs/day for 31 years    Types: Cigarettes  . Smokeless tobacco: Never Used     Comment: WAS SMOKING 2 PPD-HAS CUT BACK TO 1/2 PPD OR LESS  . Alcohol Use: No    OB History    Grav Para Term Preterm Abortions TAB SAB Ect Mult Living                  Review of Systems  Skin: Positive for rash.  All other systems reviewed and  are negative.    Allergies  Adhesive  Home Medications   Current Outpatient Rx  Name  Route  Sig  Dispense  Refill  . CHOLECALCIFEROL 1000 UNITS PO TABS   Oral   Take 1,000 Units by mouth daily.          . FUROSEMIDE 40 MG PO TABS   Oral   Take 40-80 mg by mouth daily as needed. FOR SWELLING IN LEGS IF NEEDED         . LOSARTAN POTASSIUM 100 MG PO TABS   Oral   Take 100 mg by mouth daily before breakfast.         . NABUMETONE 750 MG PO TABS   Oral   Take 750 mg by mouth 2 (two) times daily.         [name] PAROXETINE HCL 10 MG PO TABS   Oral   Take 10 mg by mouth daily before breakfast.         . POTASSIUM CHLORIDE ER 10 MEQ PO TBCR   Oral   Take 10 mEq by mouth daily as needed. ONLY TAKES IF LASIX TAKEN         . ALBUTEROL SULFATE HFA 108 (90 BASE) MCG/ACT IN AERS   Inhalation   Inhale 2 puffs into the lungs every 6 (six) hours as needed for wheezing.   18 each   1   . ALPRAZOLAM 1 MG PO  TABS      TAKE 1 TABLET BY MOUTH 3 TIMES A DAY AS NEEDED SLEEP OR ANXIETY   270 tablet   2   . BUPROPION HCL ER (SR) 100 MG PO TB12   Oral   Take 1 tablet (100 mg total) by mouth 2 (two) times daily.   180 tablet   2   . HYDROCODONE-ACETAMINOPHEN 5-500 MG PO TABS   Oral   Take 1-2 tablets by mouth 4 (four) times daily as needed. SHOULDER AND / OR KNEE PAIN         . ONE-DAILY MULTI VITAMINS PO TABS   Oral   Take 1 tablet by mouth daily.         [name] VITAMIN B-12 1000 MCG PO TABS   Oral   Take 1,000 mcg by mouth daily.            BP 120/78  Pulse 72  Temp 98.3 F (36.8 C) (Oral)  Resp 18  SpO2 100%  Physical Exam  Nursing note and vitals reviewed. Constitutional: She appears well-developed.  HENT:  Head: Normocephalic.  Mouth/Throat: Oropharynx is clear and moist.  Neck: Normal range of motion.  Cardiovascular: Normal rate.   Pulmonary/Chest: Effort normal.  Abdominal: Soft.  Musculoskeletal: Normal range of motion.  Neurological: She is alert.  Skin: Rash noted.       Erythematous rash, arm, abdomen and face    ED Course  Procedures (including critical care time)  Labs Reviewed - No data to display No results found.   No diagnosis found.    MDM         Elson Areas, PA 02/01/12 [phone]

## 2012-02-01 NOTE — ED Notes (Signed)
Pt states that she is having an allergic reaction to adhesives that was used on her in the hospital. Pt just had lap band surgery done two weeks ago.  Pt's forearm face chest and under breast area are red and inflamed and extremely itchy. Pt denies any changes in soaps or detergents.

## 2012-02-02 ENCOUNTER — Encounter (INDEPENDENT_AMBULATORY_CARE_PROVIDER_SITE_OTHER): Payer: Self-pay | Admitting: Surgery

## 2012-02-02 NOTE — ED Provider Notes (Signed)
Medical screening examination/treatment/procedure(s) were performed by resident physician or non-physician practitioner and as supervising physician I was immediately available for consultation/collaboration.   Claude Waldman DOUGLAS MD.    Aren Pryde D Malika Demario, MD 02/02/12 1628 

## 2012-02-03 ENCOUNTER — Other Ambulatory Visit: Payer: Self-pay | Admitting: Internal Medicine

## 2012-02-06 ENCOUNTER — Encounter: Payer: Managed Care, Other (non HMO) | Attending: Surgery | Admitting: *Deleted

## 2012-02-06 ENCOUNTER — Encounter (INDEPENDENT_AMBULATORY_CARE_PROVIDER_SITE_OTHER): Payer: Self-pay | Admitting: Surgery

## 2012-02-06 ENCOUNTER — Ambulatory Visit (INDEPENDENT_AMBULATORY_CARE_PROVIDER_SITE_OTHER): Payer: Managed Care, Other (non HMO) | Admitting: Surgery

## 2012-02-06 ENCOUNTER — Encounter: Payer: Self-pay | Admitting: *Deleted

## 2012-02-06 VITALS — BP 112/72 | HR 92 | Temp 97.6°F | Resp 16 | Ht 67.0 in | Wt 371.0 lb

## 2012-02-06 DIAGNOSIS — Z01818 Encounter for other preprocedural examination: Secondary | ICD-10-CM | POA: Diagnosis not present

## 2012-02-06 DIAGNOSIS — Z713 Dietary counseling and surveillance: Secondary | ICD-10-CM | POA: Diagnosis not present

## 2012-02-06 DIAGNOSIS — Z9884 Bariatric surgery status: Secondary | ICD-10-CM

## 2012-02-06 NOTE — Progress Notes (Signed)
Stephanie Harper 41 y.o.  Body mass index is 58.11 kg/(m^2).  Patient Active Problem List  Diagnosis  . OBESITY, MORBID  . ANXIETY  . PANIC ATTACK  . TOBACCO USE DISORDER/SMOKER-SMOKING CESSATION DISCUSSED  . DEPRESSION  . HYPERTENSION  . UPPER RESPIRATORY INFECTION, ACUTE  . ASTHMA  . CONTACT DERMATITIS  . SHOULDER PAIN  . ELBOW PAIN  . LOW BACK PAIN  . ANAL OR RECTAL PAIN  . HEMATOCHEZIA  . Night sweats  . Arthralgia  . Lapband APS Jan 2014    Allergies  Allergen Reactions  . Adhesive (Tape) Other (See Comments)    blisters    Past Surgical History  Procedure Date  . Total shoulder replacement     Right  . Breath tek h pylori 07/15/2011    Procedure: BREATH TEK H PYLORI;  Surgeon: Valarie Merino, MD;  Location: [facility];  Service: General;  Laterality: N/A;  . Laser ablation   . Cholecystectomy   . Appendectomy   . Tonsillectomy   . Tubal ligation     UTERINE ABLATION WAS DONE AT THE TIME OF TUBAL LIGATION  . Laparoscopic gastric banding 01/21/2012    Procedure: LAPAROSCOPIC GASTRIC BANDING;  Surgeon: Valarie Merino, MD;  Location: [facility];  Service: General;  Laterality: N/A;  . Mesh applied to lap port 01/21/2012    Procedure: MESH APPLIED TO LAP PORT;  Surgeon: Valarie Merino, MD;  Location: [facility];  Service: General;;   Sonda Primes, MD No diagnosis found.  First postop visit.  Had reaction to steristrips with allergy requiring prednisone per Urgent Care.  Incisions look fine. Minimal residual from her allergic reaction. Will be ready for first bandfill 6 weeks from her surgery Matt B. Daphine Deutscher, MD, [facility] [phone] beeper [phone]  02/06/2012 10:11 AM

## 2012-02-06 NOTE — Progress Notes (Signed)
  2 Week Post-Operative Nutrition Follow up:  Appt start time: 1115  End time:  1215.  Patient was seen on 02/06/12 for Post-Operative Nutrition education at [facility].   Surgery date: 01/21/12 Surgery type: LAGB Start weight at [facility]: 393.9 lbs (07/18/11)  Weight today: 369.5 lbs Weight change: 23.5 lbs Total weight lost: 24.4 lbs   Weight loss goal: 200-250 lbs % goal met: 13-17%  TANITA  BODY COMP RESULTS  02/06/12   BMI (kg/m^2) 57.9   Fat Mass (lbs) 216.5   Fat Free Mass (lbs) 153.0   Total Body Water (lbs) 112.0   The following the learning objectives were met by the patient during this visit:  Identifies Phase 3A (Soft, High Proteins) Dietary Goals and will begin from 2 weeks post-operatively to 2 months post-operatively  Identifies appropriate sources of fluids and proteins   States protein recommendations and appropriate sources post-operatively  Identifies the need for appropriate texture modifications, mastication, and bite sizes when consuming solids  Identifies appropriate multivitamin and calcium sources post-operatively  Describes the need for physical activity post-operatively and will follow MD recommendations  States when to call healthcare provider regarding medication questions or post-operative complications  Handouts given during class include:  Phase 3A: Soft, High Protein Diet Handout  Band Fill Guidelines Handout  Follow-Up Plan: Patient will follow-up at [facility] in 6 weeks for 2 months post-op nutrition visit for diet advancement per MD.

## 2012-02-06 NOTE — Patient Instructions (Addendum)
Patient to follow Phase 3A-Soft, High Protein Diet and follow-up at [facility] in 6 weeks for 2 months post-op nutrition visit for diet advancement. May start Phase 3B after first fill with Dr. Daphine Deutscher if tolerating proteins ok.

## 2012-02-12 ENCOUNTER — Other Ambulatory Visit: Payer: Self-pay | Admitting: Internal Medicine

## 2012-02-17 ENCOUNTER — Telehealth (INDEPENDENT_AMBULATORY_CARE_PROVIDER_SITE_OTHER): Payer: Self-pay

## 2012-02-17 NOTE — Telephone Encounter (Signed)
Returned pt's call regarding her 1st lap band fill appt.  Pt scheduled for Monday, march 3rd @ 1010a

## 2012-02-27 ENCOUNTER — Other Ambulatory Visit: Payer: Self-pay | Admitting: Internal Medicine

## 2012-03-04 ENCOUNTER — Encounter: Payer: Self-pay | Admitting: Internal Medicine

## 2012-03-04 ENCOUNTER — Ambulatory Visit (INDEPENDENT_AMBULATORY_CARE_PROVIDER_SITE_OTHER): Payer: Managed Care, Other (non HMO) | Admitting: Internal Medicine

## 2012-03-04 DIAGNOSIS — M545 Low back pain, unspecified: Secondary | ICD-10-CM

## 2012-03-04 DIAGNOSIS — J45909 Unspecified asthma, uncomplicated: Secondary | ICD-10-CM

## 2012-03-04 DIAGNOSIS — I10 Essential (primary) hypertension: Secondary | ICD-10-CM

## 2012-03-04 DIAGNOSIS — F329 Major depressive disorder, single episode, unspecified: Secondary | ICD-10-CM

## 2012-03-04 DIAGNOSIS — F410 Panic disorder [episodic paroxysmal anxiety] without agoraphobia: Secondary | ICD-10-CM

## 2012-03-04 DIAGNOSIS — M2550 Pain in unspecified joint: Secondary | ICD-10-CM

## 2012-03-04 DIAGNOSIS — I872 Venous insufficiency (chronic) (peripheral): Secondary | ICD-10-CM | POA: Insufficient documentation

## 2012-03-04 MED ORDER — HYDROCODONE-ACETAMINOPHEN 5-325 MG PO TABS: 1.0000 | ORAL_TABLET | Freq: Two times a day (BID) | ORAL | Status: DC | PRN

## 2012-03-04 NOTE — Assessment & Plan Note (Signed)
Continue with current prescription therapy as reflected on the Med list. prn 

## 2012-03-04 NOTE — Assessment & Plan Note (Signed)
Continue with current prescription therapy as reflected on the Med list.  

## 2012-03-04 NOTE — Assessment & Plan Note (Signed)
Chronic  S/p Lap-band 1/14 Dr Daphine Deutscher

## 2012-03-04 NOTE — Assessment & Plan Note (Signed)
Chronic L>R Elevate legs

## 2012-03-04 NOTE — Progress Notes (Signed)
Subjective:     HPI   The patient is here to follow up on chronic R shoulder pain, depression, anxiety, headaches and chronic moderate fibromyalgia symptoms controlled with medicines, diet and exercise.  C/o bad arthralgias x 3 wks - all the time C/o LE joints in LEs B and LS spine - relafen did not help...  F/u severe obesity -- s/p lap band  Wt Readings from Last 3 Encounters:  03/04/12 366 lb (166.017 kg)  02/06/12 369 lb 8 oz (167.604 kg)  02/06/12 371 lb (168.284 kg)   BP Readings from Last 3 Encounters:  03/04/12 112/84  02/06/12 112/72  02/01/12 120/78        Review of Systems  Constitutional: Negative.  Negative for fever, chills, diaphoresis, activity change, appetite change, fatigue and unexpected weight change.  HENT: Negative for hearing loss, ear pain, nosebleeds, congestion, sore throat, facial swelling, rhinorrhea, sneezing, mouth sores, trouble swallowing, neck pain, neck stiffness, postnasal drip, sinus pressure and tinnitus.   Eyes: Negative for pain, discharge, redness, itching and visual disturbance.  Respiratory: Negative for cough, chest tightness, shortness of breath, wheezing and stridor.   Cardiovascular: Negative for chest pain, palpitations and leg swelling.  Gastrointestinal: Negative for nausea, abdominal pain, diarrhea, constipation, blood in stool, abdominal distention, anal bleeding and rectal pain.  Genitourinary: Negative for dysuria, urgency, frequency, hematuria, flank pain, vaginal bleeding, vaginal discharge, difficulty urinating, genital sores and pelvic pain.  Musculoskeletal: Positive for back pain. Negative for joint swelling, arthralgias and gait problem.  Skin: Negative.  Negative for rash.  Neurological: Negative for dizziness, tremors, seizures, syncope, speech difficulty, weakness, numbness and headaches.  Hematological: Negative for adenopathy. Does not bruise/bleed easily.  Psychiatric/Behavioral: Negative for suicidal ideas,  behavioral problems, sleep disturbance, dysphoric mood and decreased concentration. The patient is not nervous/anxious.            Objective:   Physical Exam  Constitutional: She appears well-developed. No distress.  Obese  HENT:  Head: Normocephalic.  Right Ear: External ear normal.  Left Ear: External ear normal.  Nose: Nose normal.  Mouth/Throat: Oropharynx is clear and moist.  Eyes: Conjunctivae are normal. Pupils are equal, round, and reactive to light. Right eye exhibits no discharge. Left eye exhibits no discharge.  Neck: Normal range of motion. Neck supple. No JVD present. No tracheal deviation present. No thyromegaly present.  Cardiovascular: Normal rate, regular rhythm and normal heart sounds.   Pulmonary/Chest: No stridor. No respiratory distress. She has no wheezes.  Abdominal: Soft. Bowel sounds are normal. She exhibits no distension and no mass. There is no tenderness. There is no rebound and no guarding.  Musculoskeletal: She exhibits no edema and no tenderness.  Lymphadenopathy:    She has no cervical adenopathy.  Neurological: She displays normal reflexes. No cranial nerve deficit. She exhibits normal muscle tone. Coordination normal.  Skin: No rash noted. No erythema.  Psychiatric: She has a normal mood and affect. Her behavior is normal. Judgment and thought content normal.   Lab Results  Component Value Date   WBC 10.1 01/22/2012   HGB 12.7 01/22/2012   HCT 38.6 01/22/2012   PLT 258 01/22/2012   GLUCOSE 86 01/10/2012   CHOL 175 01/29/2010   TRIG 97.0 01/29/2010   HDL 36.30* 01/29/2010   LDLCALC 119* 01/29/2010   ALT 24 01/10/2012   AST 19 01/10/2012   NA 139 01/10/2012   K 4.0 01/10/2012   CL 101 01/10/2012   CREATININE 0.54 01/21/2012   BUN 10  01/10/2012   CO2 29 01/10/2012   TSH 2.491 06/25/2011   INR 1.0 08/09/2007   HGBA1C 5.7 11/05/2007          Assessment & Plan:

## 2012-03-04 NOTE — Assessment & Plan Note (Signed)
Doing well 

## 2012-03-09 ENCOUNTER — Ambulatory Visit (INDEPENDENT_AMBULATORY_CARE_PROVIDER_SITE_OTHER): Payer: Managed Care, Other (non HMO) | Admitting: Surgery

## 2012-03-09 ENCOUNTER — Encounter (INDEPENDENT_AMBULATORY_CARE_PROVIDER_SITE_OTHER): Payer: Self-pay | Admitting: Surgery

## 2012-03-09 VITALS — BP 150/74 | HR 84 | Temp 97.2°F | Resp 16 | Ht 67.5 in | Wt 360.8 lb

## 2012-03-09 DIAGNOSIS — Z9884 Bariatric surgery status: Secondary | ICD-10-CM

## 2012-03-09 NOTE — Progress Notes (Signed)
Lapband Fill Encounter Problem List:   Patient Active Problem List  Diagnosis  . OBESITY, MORBID  . ANXIETY  . PANIC ATTACK  . TOBACCO USE DISORDER/SMOKER-SMOKING CESSATION DISCUSSED  . DEPRESSION  . HYPERTENSION  . UPPER RESPIRATORY INFECTION, ACUTE  . ASTHMA  . CONTACT DERMATITIS  . SHOULDER PAIN  . ELBOW PAIN  . LOW BACK PAIN  . ANAL OR RECTAL PAIN  . HEMATOCHEZIA  . Night sweats  . Arthralgia  . Lapband APS Jan 2014  . Venous insufficiency of leg    [name] Body mass index is 55.64 kg/(m^2). Weight loss since surgery  36.2 lbs  Having regurgitation?:  no  Feel that they need a fill?  This is her first fill  Nocturnal reflux?  no  Amount of fill  1   Port site: OK.  She had prior allergy to tape.    Instructions given and weight loss goals discussed.  I saw her at the [facility] last week and she is working out.  Will see again in 6 weeks for fill.   Matt B. Daphine Deutscher, MD, FACS

## 2012-03-09 NOTE — Patient Instructions (Addendum)

## 2012-03-19 ENCOUNTER — Encounter: Payer: Self-pay | Admitting: *Deleted

## 2012-03-19 ENCOUNTER — Other Ambulatory Visit: Payer: Self-pay | Admitting: Internal Medicine

## 2012-03-19 ENCOUNTER — Encounter: Payer: Managed Care, Other (non HMO) | Attending: Surgery | Admitting: *Deleted

## 2012-03-19 DIAGNOSIS — Z01818 Encounter for other preprocedural examination: Secondary | ICD-10-CM | POA: Insufficient documentation

## 2012-03-19 DIAGNOSIS — Z713 Dietary counseling and surveillance: Secondary | ICD-10-CM | POA: Insufficient documentation

## 2012-03-19 NOTE — Patient Instructions (Addendum)
Goals:  Follow Phase 3B: High Protein + Non-Starchy Vegetables  Limit carb intake to ~15g per meal  Increase lean protein foods to meet 60-80g goal  Increase fluid intake to 64oz +  Aim for >30 min of physical activity daily

## 2012-03-19 NOTE — Progress Notes (Signed)
  Follow-up visit:  8 Weeks Post-Operative LAGB Surgery  Medical Nutrition Therapy:  Appt start time: 1045   End time:  1115.  Primary concerns today: Post-operative Bariatric Surgery Nutrition Management. [name] returns for f/u with additional 16 lb wt loss (23.5 lbs of FAT MASS). Reports cellulitis in left leg, though not painful. Has stopped taking her Lasix and potassium. First fill on 03/09/12; reports she feels no restriction at all. Otherwise doing well overall.   Surgery date: 01/21/12 Surgery type: LAGB Start weight at [facility]: 393.9 lbs (07/18/11)  Weight today: 353.5 lbs Weight change: 16.0 lbs Total weight lost: 40.4 lbs   Weight loss goal: 200-250 lbs % goal met: 21-28%  TANITA  BODY COMP RESULTS  02/06/12 03/19/12   BMI (kg/m^2) 57.9 55.4   Fat Mass (lbs) 216.5 193.0   Fat Free Mass (lbs) 153.0 160.5   Total Body Water (lbs) 112.0 117.5    24-hr recall: B (9 AM): 3 Egg whites, sauteed mushrooms (15g) Snk (AM): NONE  L (12:30-1 PM): Shake or Atkins meal or salad w/ (2-2.5 oz) protein  (15-20g) Snk (PM): Cheese stick or Atkins bars (6-10g) D (6 PM): Same as lunch (15-20g) Snk (PM): Cheese stick or frozen Dannon light & fit greek yogurt (6-12g)  Fluid intake: 40 oz unsweet tea, 11 oz shake, 8 oz 1% milk = ~60 oz Estimated total protein intake: 60-75g  Medications:  Not taking Lasix or potassium  Supplementation: Taking MVI, B12, Vit D; No calcium d/t constipation  Using straws: No Drinking while eating: No Hair loss: No Carbonated beverages: No N/V/D/C: Constipation with calcium Last Lap-Band fill:  First fill on 03/09/12 - feels very little restriction   Recent physical activity:  Gym 4 days/week - works with trainer and does other activities on her own. Cardio and water aerobics  Progress Towards Goal(s):  In progress.  Handouts given during visit include:  Phase 3B: High Protein + Vegetables   Nutritional Diagnosis:  [place]-3.3 Overweight/obesity related to past  poor dietary habits and physical inactivity as evidenced by patient w/ recent LAGB surgery following dietary guidelines for continued weight loss.    Intervention:  Nutrition education/diet advancement.  Monitoring/Evaluation:  Dietary intake, exercise, lap band fills, and body weight. Follow up in 1 months for 3 month post-op visit.

## 2012-03-27 ENCOUNTER — Other Ambulatory Visit: Payer: Self-pay | Admitting: Obstetrics and Gynecology

## 2012-03-27 DIAGNOSIS — Z01419 Encounter for gynecological examination (general) (routine) without abnormal findings: Secondary | ICD-10-CM | POA: Diagnosis not present

## 2012-03-31 LAB — PAP, THINPREP RFLX HPV

## 2012-04-15 ENCOUNTER — Other Ambulatory Visit: Payer: Self-pay | Admitting: Internal Medicine

## 2012-04-16 ENCOUNTER — Ambulatory Visit: Payer: Managed Care, Other (non HMO) | Admitting: *Deleted

## 2012-04-16 ENCOUNTER — Other Ambulatory Visit: Payer: Self-pay | Admitting: Internal Medicine

## 2012-04-18 IMAGING — CR DG CHEST 2V
2 series · 2 of 2 positions shown · non-contrast
Comparison: One-view chest x-ray 08/08/2007.

CLINICAL DATA: 1-week history of night sweats.  Smoker with history
of hypertension and asthma.

CHEST - 2 VIEW 06/05/2010:

[view not recorded (1 of 2)]
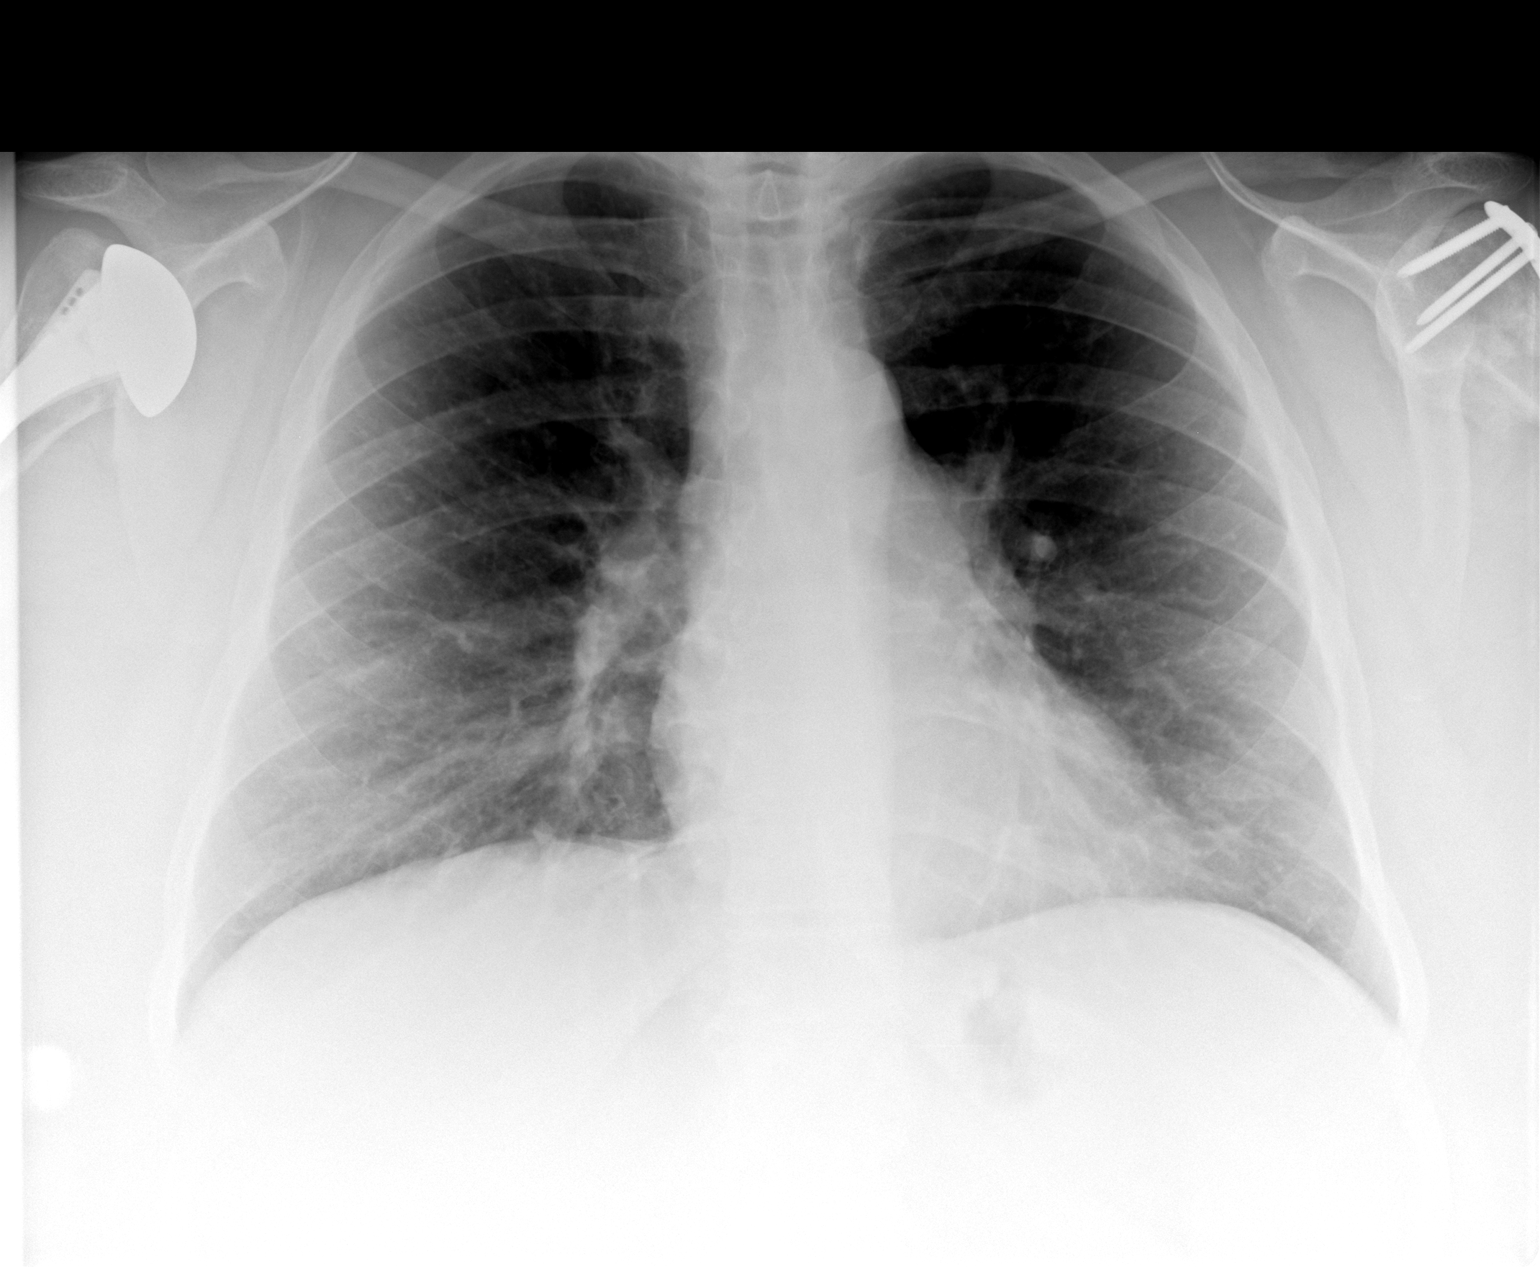

[view not recorded (2 of 2)]
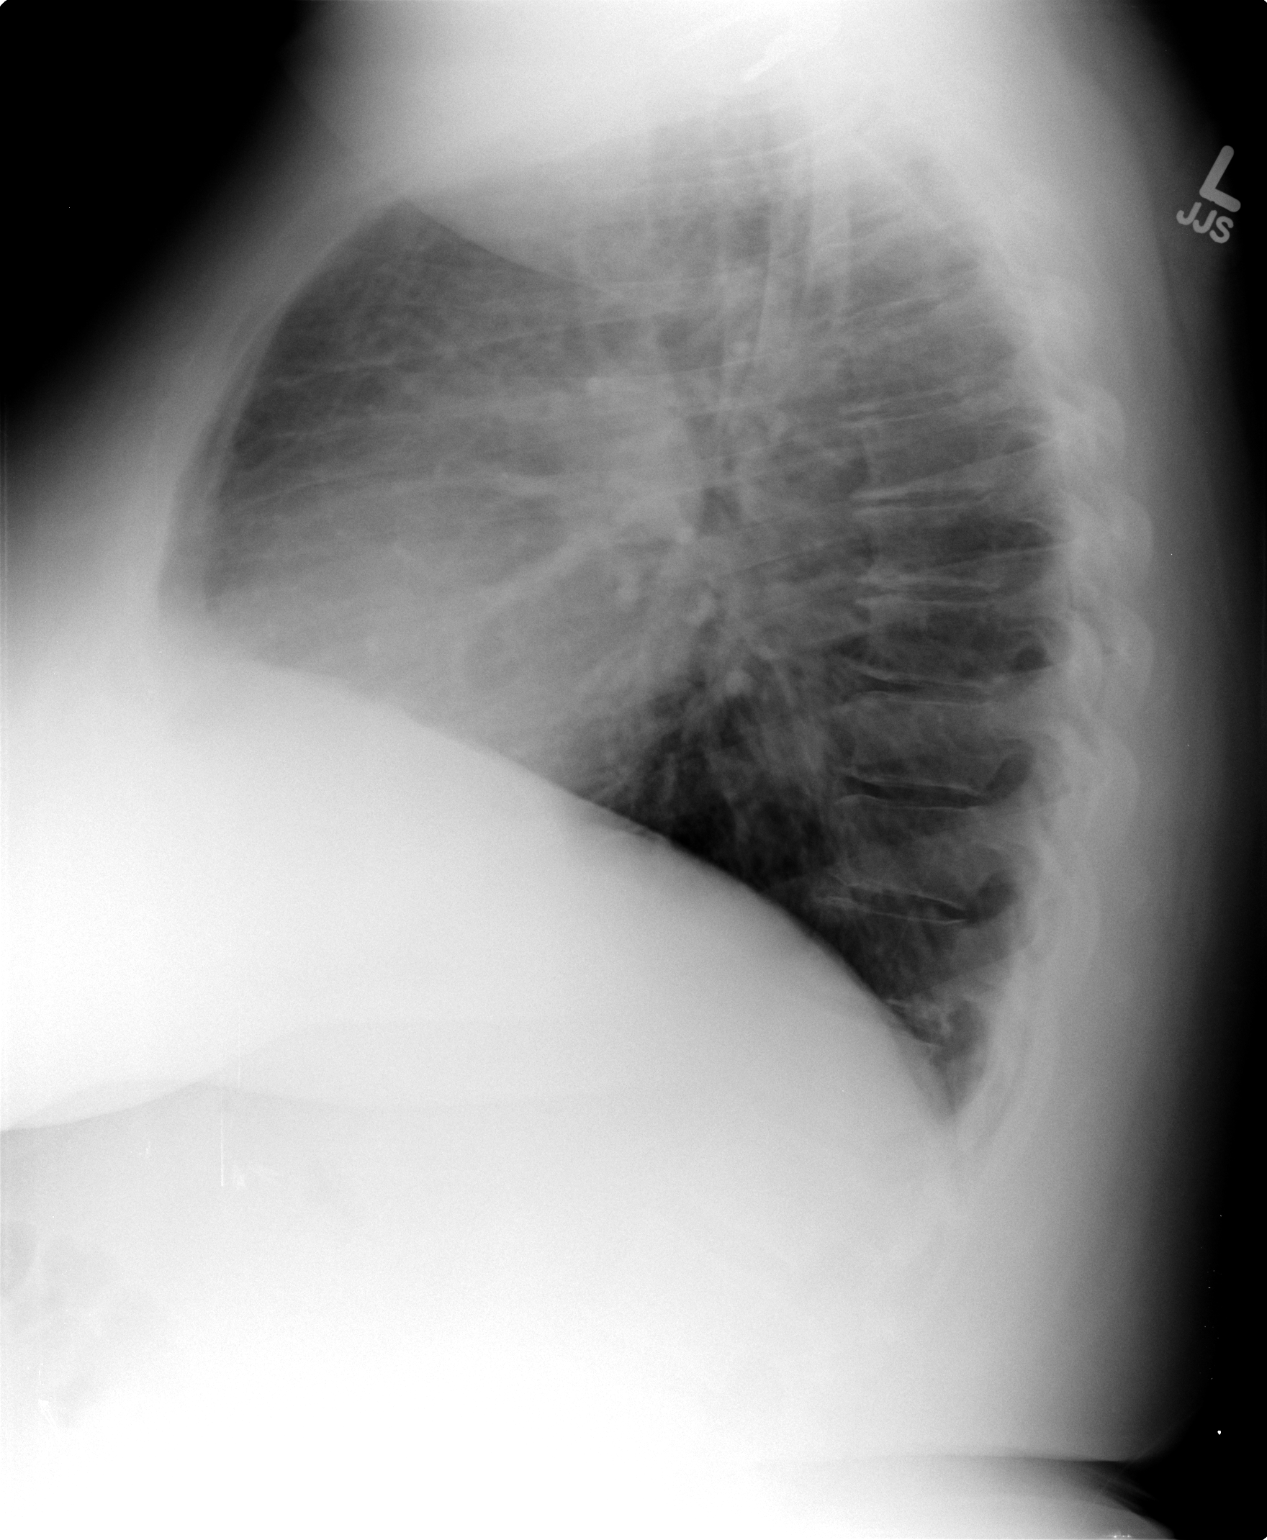

[2 of 2 positions shown; findings below may reference images not displayed]

FINDINGS: Cardiomediastinal silhouette unremarkable.  Lungs clear.
Bronchovascular markings normal.  No pleural effusions.  Prior
right shoulder hemiarthroplasty with anatomic alignment.
Degenerative changes involving the thoracic spine.
IMPRESSION: No acute cardiopulmonary disease.

## 2012-04-21 ENCOUNTER — Other Ambulatory Visit: Payer: Self-pay | Admitting: Obstetrics and Gynecology

## 2012-04-21 DIAGNOSIS — Z1231 Encounter for screening mammogram for malignant neoplasm of breast: Secondary | ICD-10-CM

## 2012-05-11 ENCOUNTER — Ambulatory Visit
Admission: RE | Admit: 2012-05-11 | Discharge: 2012-05-11 | Disposition: A | Discharge status: Home / Self Care | Payer: Managed Care, Other (non HMO) | Source: Ambulatory Visit | Attending: Obstetrics and Gynecology | Admitting: Obstetrics and Gynecology

## 2012-05-11 DIAGNOSIS — Z1231 Encounter for screening mammogram for malignant neoplasm of breast: Secondary | ICD-10-CM

## 2012-05-13 ENCOUNTER — Encounter: Payer: Self-pay | Admitting: *Deleted

## 2012-05-13 ENCOUNTER — Encounter: Payer: Managed Care, Other (non HMO) | Attending: Surgery | Admitting: *Deleted

## 2012-05-13 DIAGNOSIS — Z713 Dietary counseling and surveillance: Secondary | ICD-10-CM | POA: Insufficient documentation

## 2012-05-13 DIAGNOSIS — Z01818 Encounter for other preprocedural examination: Secondary | ICD-10-CM | POA: Insufficient documentation

## 2012-05-13 NOTE — Patient Instructions (Addendum)
Goals:  Follow Phase 3B: High Protein + Non-Starchy Vegetables  Continue intake of lean protein foods to meet 60-80g goal  Increase fluid intake to 64oz +  Add 15 grams of carbohydrate (fruit, whole grain, starchy vegetable) with meals  Avoid drinking 15 minutes before, during and 30 minutes after eating  TANITA  BODY COMP RESULTS  02/06/12 03/19/12 05/13/12   BMI (kg/m^2) 57.9 55.4 53.0   Fat Mass (lbs) 216.5 193.0 179.5   Fat Free Mass (lbs) 153.0 160.5 159.0   Total Body Water (lbs) 112.0 117.5  116.5

## 2012-05-13 NOTE — Progress Notes (Signed)
  Follow-up visit:  15 Weeks Post-Operative LAGB Surgery  Medical Nutrition Therapy:  Appt start time: 1030   End time:  1100.  Primary concerns today: Post-operative Bariatric Surgery Nutrition Management. [name] returns for f/u with additional 15.0 lbs. Doing well and following all post-op guidelines. Still smoking.   Surgery date: 01/21/12 Surgery type: LAGB Start weight at [facility]: 393.9 lbs (07/18/11)  Weight today: 338.5 lbs Weight change: 15.0 lbs Total weight lost: 55.4 lbs   Weight loss goal: 200-250 lbs % goal met: 29-38%  TANITA  BODY COMP RESULTS  02/06/12 03/19/12 05/13/12   BMI (kg/m^2) 57.9 55.4 53.0   Fat Mass (lbs) 216.5 193.0 179.5   Fat Free Mass (lbs) 153.0 160.5 159.0   Total Body Water (lbs) 112.0 117.5  116.5   24-hr recall: B (9 AM): Atkins shake OR 2 egg whites w/ shredded cheese and mushrooms (15g) Snk (10 AM): Boiled egg or cheese stick OR NONE  L (11:3-12 PM): Salad w/ (2-2.5 oz) chicken strips and veggies - sometimes boiled egg (15-20g) Snk (PM): Cheese stick or cucumbers OR Atkins bars - cashew trail mix flavor (6-10g) *Got a frosty the other day-not usual D (6 PM):Grilled chicken (3 oz) w/ 2 tbsp rice and (2) pickled beets Snk (PM): Cheese stick or frozen Dannon light & fit greek yogurt (6-12g)  Fluid intake:  ~60 oz Estimated total protein intake: 60-75g  Medications:  Not taking Lasix or potassium  Supplementation: Taking MVI, B12, Vit D; Has added biotin. Still not taking any calcium d/t constipation  Using straws: No Drinking while eating: No Hair loss: Yes; increased since last visit  Carbonated beverages: No N/V/D/C: Constipation with calcium; regurgitation with dry chicken when not chewed well enough and a bite of a [employer] donut Last Lap-Band fill:  First fill on 03/09/12 - feels very little restriction   Recent physical activity:  Gym 4 days/week. Cardio and water aerobics/swimming  Progress Towards Goal(s):  In  progress.   Nutritional Diagnosis:  [place]-3.3 Overweight/obesity related to past poor dietary habits and physical inactivity as evidenced by patient w/ recent LAGB surgery following dietary guidelines for continued weight loss.    Intervention:  Nutrition education/diet advancement.  Monitoring/Evaluation:  Dietary intake, exercise, lap band fills, and body weight. Follow up in 3 months for 6-7 month post-op visit.

## 2012-05-15 ENCOUNTER — Encounter (INDEPENDENT_AMBULATORY_CARE_PROVIDER_SITE_OTHER): Payer: Self-pay | Admitting: Surgery

## 2012-05-15 ENCOUNTER — Ambulatory Visit (INDEPENDENT_AMBULATORY_CARE_PROVIDER_SITE_OTHER): Payer: Managed Care, Other (non HMO) | Admitting: Surgery

## 2012-05-15 VITALS — BP 116/80 | HR 68 | Temp 98.9°F | Resp 16 | Ht 67.5 in | Wt 338.6 lb

## 2012-05-15 DIAGNOSIS — Z9884 Bariatric surgery status: Secondary | ICD-10-CM

## 2012-05-15 NOTE — Progress Notes (Signed)
Lapband Fill Encounter Problem List:   Patient Active Problem List   Diagnosis Date Noted  . Venous insufficiency of leg 03/04/2012  . Lapband APS Jan 2014 01/21/2012  . Arthralgia 10/04/2011  . Night sweats 06/05/2010  . ANAL OR RECTAL PAIN 01/05/2010  . HEMATOCHEZIA 01/05/2010  . ELBOW PAIN 10/31/2009  . UPPER RESPIRATORY INFECTION, ACUTE 12/06/2008  . SHOULDER PAIN 12/06/2008  . CONTACT DERMATITIS 06/03/2008  . OBESITY, MORBID 05/04/2008  . TOBACCO USE DISORDER/SMOKER-SMOKING CESSATION DISCUSSED 07/28/2007  . ANXIETY 12/26/2006  . PANIC ATTACK 12/26/2006  . HYPERTENSION 12/26/2006  . ASTHMA 12/26/2006  . DEPRESSION 08/28/2006  . LOW BACK PAIN 08/28/2006    [name] Body mass index is 52.22 kg/(m^2). Weight loss since surgery  58.4   Having regurgitation?:  no  Feel that they need a fill?  yes  Nocturnal reflux?  no  Amount of fill  0.5   Port site: Assessed easily. She is doing very well. She saw Maralyn Sago this week and I feel like she is learning how to use her band. We'll see her back in 6 weeks  Instructions given and weight loss goals discussed.    Matt B. Daphine Deutscher, MD, FACS

## 2012-05-15 NOTE — Patient Instructions (Signed)

## 2012-06-09 ENCOUNTER — Encounter: Payer: Self-pay | Admitting: Internal Medicine

## 2012-06-09 ENCOUNTER — Other Ambulatory Visit (INDEPENDENT_AMBULATORY_CARE_PROVIDER_SITE_OTHER): Payer: Managed Care, Other (non HMO)

## 2012-06-09 ENCOUNTER — Ambulatory Visit (INDEPENDENT_AMBULATORY_CARE_PROVIDER_SITE_OTHER): Payer: Managed Care, Other (non HMO) | Admitting: Internal Medicine

## 2012-06-09 VITALS — BP 106/70 | HR 72 | Temp 98.3°F | Resp 16 | Wt 331.0 lb

## 2012-06-09 DIAGNOSIS — M545 Low back pain, unspecified: Secondary | ICD-10-CM | POA: Diagnosis not present

## 2012-06-09 DIAGNOSIS — I10 Essential (primary) hypertension: Secondary | ICD-10-CM | POA: Diagnosis not present

## 2012-06-09 DIAGNOSIS — R42 Dizziness and giddiness: Secondary | ICD-10-CM

## 2012-06-09 DIAGNOSIS — F411 Generalized anxiety disorder: Secondary | ICD-10-CM

## 2012-06-09 DIAGNOSIS — F329 Major depressive disorder, single episode, unspecified: Secondary | ICD-10-CM

## 2012-06-09 LAB — BASIC METABOLIC PANEL
BUN: 9 mg/dL (ref 6–23)
Calcium: 9.3 mg/dL (ref 8.4–10.5)
Chloride: 107 mEq/L (ref 96–112)
Creatinine, Ser: 0.6 mg/dL (ref 0.4–1.2)
GFR: 116.96 mL/min (ref >60.00)

## 2012-06-09 LAB — MAGNESIUM: Magnesium: 1.8 mg/dL (ref 1.5–2.5)

## 2012-06-09 LAB — CBC WITH DIFFERENTIAL/PLATELET
Basophils Absolute: 0 10*3/uL (ref 0.0–0.1)
Eosinophils Absolute: 0.1 10*3/uL (ref 0.0–0.7)
HCT: 40.7 % (ref 36.0–46.0)
Lymphocytes Relative: 16.2 % (ref 12.0–46.0)
Lymphs Abs: 1.6 10*3/uL (ref 0.7–4.0)
Monocytes Relative: 7.3 % (ref 3.0–12.0)
Platelets: 289 10*3/uL (ref 150.0–400.0)
RDW: 14.2 % (ref 11.5–14.6)

## 2012-06-09 LAB — TSH: TSH: 2.32 u[IU]/mL (ref 0.35–5.50)

## 2012-06-09 LAB — VITAMIN B12: Vitamin B-12: 865 pg/mL (ref 211–911)

## 2012-06-09 MED ORDER — PAROXETINE HCL 10 MG PO TABS: 10.0000 mg | ORAL_TABLET | ORAL | Status: DC

## 2012-06-09 NOTE — Patient Instructions (Addendum)
Take Losartan 1/2 tab  Wt Readings from Last 3 Encounters:  06/09/12 331 lb (150.141 kg)  05/15/12 338 lb 9.6 oz (153.588 kg)  05/13/12 338 lb 8 oz (153.543 kg)

## 2012-06-09 NOTE — Progress Notes (Signed)
Subjective:     HPI  C/o lightheadedness when standing up  The patient is here to follow up on chronic R shoulder pain, depression, anxiety, headaches and chronic moderate fibromyalgia symptoms controlled with medicines, diet and exercise.  C/o bad arthralgias x 3 wks - all the time C/o LE joints in LEs B and LS spine - relafen did not help...  F/u severe obesity -- s/p lap band  Wt Readings from Last 3 Encounters:  06/09/12 331 lb (150.141 kg)  05/15/12 338 lb 9.6 oz (153.588 kg)  05/13/12 338 lb 8 oz (153.543 kg)   BP Readings from Last 3 Encounters:  06/09/12 106/70  05/15/12 116/80  03/09/12 150/74        Review of Systems  Constitutional: Negative.  Negative for fever, chills, diaphoresis, activity change, appetite change, fatigue and unexpected weight change.  HENT: Negative for hearing loss, ear pain, nosebleeds, congestion, sore throat, facial swelling, rhinorrhea, sneezing, mouth sores, trouble swallowing, neck pain, neck stiffness, postnasal drip, sinus pressure and tinnitus.   Eyes: Negative for pain, discharge, redness, itching and visual disturbance.  Respiratory: Negative for cough, chest tightness, shortness of breath, wheezing and stridor.   Cardiovascular: Negative for chest pain, palpitations and leg swelling.  Gastrointestinal: Negative for nausea, abdominal pain, diarrhea, constipation, blood in stool, abdominal distention, anal bleeding and rectal pain.  Genitourinary: Negative for dysuria, urgency, frequency, hematuria, flank pain, vaginal bleeding, vaginal discharge, difficulty urinating, genital sores and pelvic pain.  Musculoskeletal: Positive for back pain. Negative for joint swelling, arthralgias and gait problem.  Skin: Negative.  Negative for rash.  Neurological: Negative for dizziness, tremors, seizures, syncope, speech difficulty, weakness, numbness and headaches.  Hematological: Negative for adenopathy. Does not bruise/bleed easily.   Psychiatric/Behavioral: Negative for suicidal ideas, behavioral problems, sleep disturbance, dysphoric mood and decreased concentration. The patient is not nervous/anxious.            Objective:   Physical Exam  Constitutional: She appears well-developed. No distress.  Obese  HENT:  Head: Normocephalic.  Right Ear: External ear normal.  Left Ear: External ear normal.  Nose: Nose normal.  Mouth/Throat: Oropharynx is clear and moist.  Eyes: Conjunctivae are normal. Pupils are equal, round, and reactive to light. Right eye exhibits no discharge. Left eye exhibits no discharge.  Neck: Normal range of motion. Neck supple. No JVD present. No tracheal deviation present. No thyromegaly present.  Cardiovascular: Normal rate, regular rhythm and normal heart sounds.   Pulmonary/Chest: No stridor. No respiratory distress. She has no wheezes.  Abdominal: Soft. Bowel sounds are normal. She exhibits no distension and no mass. There is no tenderness. There is no rebound and no guarding.  Musculoskeletal: She exhibits no edema and no tenderness.  Lymphadenopathy:    She has no cervical adenopathy.  Neurological: She displays normal reflexes. No cranial nerve deficit. She exhibits normal muscle tone. Coordination normal.  Skin: No rash noted. No erythema.  Psychiatric: She has a normal mood and affect. Her behavior is normal. Judgment and thought content normal.   Lab Results  Component Value Date   WBC 10.1 01/22/2012   HGB 12.7 01/22/2012   HCT 38.6 01/22/2012   PLT 258 01/22/2012   GLUCOSE 86 01/10/2012   CHOL 175 01/29/2010   TRIG 97.0 01/29/2010   HDL 36.30* 01/29/2010   LDLCALC 119* 01/29/2010   ALT 24 01/10/2012   AST 19 01/10/2012   NA 139 01/10/2012   K 4.0 01/10/2012   CL 101 01/10/2012  CREATININE 0.54 01/21/2012   BUN 10 01/10/2012   CO2 29 01/10/2012   TSH 2.491 06/25/2011   INR 1.0 08/09/2007   HGBA1C 5.7 11/05/2007          Assessment & Plan:

## 2012-06-09 NOTE — Assessment & Plan Note (Signed)
Continue with current prn prescription therapy as reflected on the Med list.  

## 2012-06-09 NOTE — Assessment & Plan Note (Signed)
Will reduce Losartan dose 

## 2012-06-09 NOTE — Assessment & Plan Note (Signed)
Better  

## 2012-06-09 NOTE — Assessment & Plan Note (Signed)
Wt Readings from Last 3 Encounters:  06/09/12 331 lb (150.141 kg)  05/15/12 338 lb 9.6 oz (153.588 kg)  05/13/12 338 lb 8 oz (153.543 kg)

## 2012-06-09 NOTE — Assessment & Plan Note (Signed)
Will reduce Losartan dose

## 2012-06-10 LAB — VITAMIN D 25 HYDROXY (VIT D DEFICIENCY, FRACTURES): Vit D, 25-Hydroxy: 53 ng/mL (ref 30–89)

## 2012-06-25 ENCOUNTER — Ambulatory Visit (INDEPENDENT_AMBULATORY_CARE_PROVIDER_SITE_OTHER): Payer: Managed Care, Other (non HMO) | Admitting: Surgery

## 2012-06-25 ENCOUNTER — Encounter (INDEPENDENT_AMBULATORY_CARE_PROVIDER_SITE_OTHER): Payer: Self-pay | Admitting: Surgery

## 2012-06-25 VITALS — BP 130/78 | HR 76 | Temp 97.2°F | Resp 16 | Ht 67.0 in | Wt 335.0 lb

## 2012-06-25 DIAGNOSIS — Z9884 Bariatric surgery status: Secondary | ICD-10-CM

## 2012-06-25 NOTE — Patient Instructions (Signed)

## 2012-06-25 NOTE — Progress Notes (Signed)
Lapband Fill Encounter Problem List:   Patient Active Problem List   Diagnosis Date Noted  . Orthostatic lightheadedness 06/09/2012  . Venous insufficiency of leg 03/04/2012  . Lapband APS Jan 2014 01/21/2012  . Arthralgia 10/04/2011  . Night sweats 06/05/2010  . ANAL OR RECTAL PAIN 01/05/2010  . HEMATOCHEZIA 01/05/2010  . ELBOW PAIN 10/31/2009  . UPPER RESPIRATORY INFECTION, ACUTE 12/06/2008  . SHOULDER PAIN 12/06/2008  . CONTACT DERMATITIS 06/03/2008  . OBESITY, MORBID 05/04/2008  . TOBACCO USE DISORDER/SMOKER-SMOKING CESSATION DISCUSSED 07/28/2007  . ANXIETY 12/26/2006  . PANIC ATTACK 12/26/2006  . HYPERTENSION 12/26/2006  . ASTHMA 12/26/2006  . DEPRESSION 08/28/2006  . LOW BACK PAIN 08/28/2006    [name] Body mass index is 52.46 kg/(m^2). Weight loss since surgery  62  Having regurgitation?:  no  Feel that they need a fill?  yes  Nocturnal reflux?  no  Amount of fill  .5 cc     Instructions given and weight loss goals discussed.    She is getting water loss and weight loss confused.  Discussed the use of sweetners and avoidance of sweets altogether  Matt B. Daphine Deutscher, MD, FACS

## 2012-07-14 ENCOUNTER — Other Ambulatory Visit: Payer: Self-pay | Admitting: Internal Medicine

## 2012-07-17 ENCOUNTER — Other Ambulatory Visit: Payer: Self-pay | Admitting: *Deleted

## 2012-08-12 ENCOUNTER — Encounter (INDEPENDENT_AMBULATORY_CARE_PROVIDER_SITE_OTHER): Payer: Managed Care, Other (non HMO) | Admitting: Surgery

## 2012-08-12 ENCOUNTER — Other Ambulatory Visit: Payer: Self-pay | Admitting: Internal Medicine

## 2012-08-13 ENCOUNTER — Encounter: Payer: Managed Care, Other (non HMO) | Attending: Surgery | Admitting: Dietician

## 2012-08-13 ENCOUNTER — Encounter (INDEPENDENT_AMBULATORY_CARE_PROVIDER_SITE_OTHER): Payer: Self-pay | Admitting: Surgery

## 2012-08-13 ENCOUNTER — Ambulatory Visit (INDEPENDENT_AMBULATORY_CARE_PROVIDER_SITE_OTHER): Payer: Managed Care, Other (non HMO) | Admitting: Surgery

## 2012-08-13 VITALS — BP 124/76 | HR 64 | Temp 97.8°F | Resp 16 | Ht 67.5 in | Wt 317.2 lb

## 2012-08-13 DIAGNOSIS — Z01818 Encounter for other preprocedural examination: Secondary | ICD-10-CM | POA: Insufficient documentation

## 2012-08-13 DIAGNOSIS — Z9884 Bariatric surgery status: Secondary | ICD-10-CM

## 2012-08-13 DIAGNOSIS — Z713 Dietary counseling and surveillance: Secondary | ICD-10-CM | POA: Insufficient documentation

## 2012-08-13 NOTE — Progress Notes (Signed)
Lapband Fill Encounter Problem List:   Patient Active Problem List   Diagnosis Date Noted  . Orthostatic lightheadedness 06/09/2012  . Venous insufficiency of leg 03/04/2012  . Lapband APS Jan 2014 01/21/2012  . Arthralgia 10/04/2011  . Night sweats 06/05/2010  . ANAL OR RECTAL PAIN 01/05/2010  . HEMATOCHEZIA 01/05/2010  . ELBOW PAIN 10/31/2009  . UPPER RESPIRATORY INFECTION, ACUTE 12/06/2008  . SHOULDER PAIN 12/06/2008  . CONTACT DERMATITIS 06/03/2008  . OBESITY, MORBID 05/04/2008  . TOBACCO USE DISORDER/SMOKER-SMOKING CESSATION DISCUSSED 07/28/2007  . ANXIETY 12/26/2006  . PANIC ATTACK 12/26/2006  . HYPERTENSION 12/26/2006  . ASTHMA 12/26/2006  . DEPRESSION 08/28/2006  . LOW BACK PAIN 08/28/2006    [name] Body mass index is 48.92 kg/(m^2). Weight loss since surgery  80 lbs  Having regurgitation?:  no  Feel that they need a fill?  no  Nocturnal reflux?  no  Amount of fill  0     Instructions given and weight loss goals discussed.    Doing great since last fill.  Learning about her restriction as she may regurgitate when taking too much too fast.  Will see back in 2 months.   She has a skin tag on the inside of her mouth on the left.  Referred to [employer].   Matt B. Daphine Deutscher, MD, FACS

## 2012-08-13 NOTE — Patient Instructions (Signed)
Thanks for your patience.  If you need further assistance after leaving the office, please call our office and speak with a [facility] nurse.  [phone].  If you want to leave a message for Dr. Won Kreuzer, please call his office phone at [phone]. 

## 2012-08-13 NOTE — Progress Notes (Signed)
  Follow-up visit:  6 months Post-Operative LAGB Surgery  Medical Nutrition Therapy:  Appt start time: 1030   End time:  1100.  Primary concerns today: Throwing up a little more happens 4-5 x week, states is likely d/t eating dry foods, eating too much, or not chewing enough. Having a "major sweet craving" lately.   Post-operative Bariatric Surgery Nutrition Management. Stephanie Harper returns for f/u with additional 21.0 lbs. Doing well and following all post-op guidelines. Still smoking. Plans to quit smoking once at a stable plateau weight.   Surgery date: 01/21/12 Surgery type: LAGB Start weight at Stephanie Harper: 393.9 lbs (07/18/11)  Weight today: 317 lbs Weight change: 21 lbs Total weight lost: 76.9 lbs   Weight loss goal: 200-250 lbs % goal met: 40-53%  TANITA  BODY COMP RESULTS  02/06/12 03/19/12 05/13/12 08/13/12   BMI (kg/m^2) 57.9 55.4 53.0 49.7   Fat Mass (lbs) 216.5 193.0 179.5 165   Fat Free Mass (lbs) 153.0 160.5 159.0 152.0   Total Body Water (lbs) 112.0 117.5  116.5 111.5   24-hr recall: B (9 AM): Atkins shake (15g) Snk (10 AM): Boiled egg or cheese stick OR NONE  L (11:3-12 PM): Salad w/ (2-2.5 oz) chicken strips and veggies - sometimes boiled egg (15-20g) Snk (PM): Cheese stick or cucumbers, tomatoes OR Atkins bars - peanuts (6-10g) Eating a lot produce (cucumber and carrots) for snacks lately. D (6 PM):Grilled chicken (3 oz) w/ 2 tbsp rice and (2) pickled beets Snk (PM): Cheese stick (6-12g)  Fluid intake:  60+ oz Estimated total protein intake: 60-75g  Medications:  Not taking Lasix or potassium, went off losartan on June 12th   Supplementation: Taking MVI, B12, Vit D; Has added biotin. Still not taking any calcium d/t constipation  Using straws: No Drinking while eating: No Hair loss: Yes; slowing down Carbonated beverages: No N/V/D/C: Constipation resolved; regurgitation with dry chicken when not chewed well enough  Last Lap-Band fill:  Third fill on 06/25/12 - feels  restriction but doesn't feel like too much   Recent physical activity:  Gym 3 days/week. Cardio and water aerobics/swimming. Going to the pool everyday in the summer.  Progress Towards Goal(s):  In progress.   Nutritional Diagnosis:  Stephanie Harper-3.3 Overweight/obesity related to past poor dietary habits and physical inactivity as evidenced by patient w/ recent LAGB surgery following dietary guidelines for continued weight loss.    Intervention:  Nutrition education/diet advancement.  Monitoring/Evaluation:  Dietary intake, exercise, lap band fills, and body weight. Follow up in 6 months for 12 month post-op visit.

## 2012-08-13 NOTE — Patient Instructions (Addendum)
Goals:  Follow Phase 3B: High Protein + Non-Starchy Vegetables  Continue intake of lean protein foods to meet 60-80g goal  Increase fluid intake to 64oz +  Add 15 grams of carbohydrate (fruit, whole grain, starchy vegetable) with meals  Avoid drinking 15 minutes before, during and 30 minutes after eating  TANITA  BODY COMP RESULTS  02/06/12 03/19/12 05/13/12 08/13/12     BMI (kg/m^2) 57.9 55.4 53.0 49.7   Fat Mass (lbs) 216.5 193.0 179.5 165   Fat Free Mass (lbs) 153.0 160.5 159.0 152   Total Body Water (lbs) 112.0 117.5  116.5 11.5

## 2012-08-14 ENCOUNTER — Other Ambulatory Visit: Payer: Self-pay | Admitting: Internal Medicine

## 2012-09-17 ENCOUNTER — Ambulatory Visit (INDEPENDENT_AMBULATORY_CARE_PROVIDER_SITE_OTHER): Payer: Managed Care, Other (non HMO) | Admitting: Internal Medicine

## 2012-09-17 ENCOUNTER — Encounter: Payer: Self-pay | Admitting: Internal Medicine

## 2012-09-17 VITALS — BP 100/62 | HR 72 | Temp 98.8°F | Resp 16 | Wt 312.0 lb

## 2012-09-17 DIAGNOSIS — F411 Generalized anxiety disorder: Secondary | ICD-10-CM

## 2012-09-17 DIAGNOSIS — M2550 Pain in unspecified joint: Secondary | ICD-10-CM

## 2012-09-17 DIAGNOSIS — I10 Essential (primary) hypertension: Secondary | ICD-10-CM

## 2012-09-17 DIAGNOSIS — J45909 Unspecified asthma, uncomplicated: Secondary | ICD-10-CM

## 2012-09-17 DIAGNOSIS — Z23 Encounter for immunization: Secondary | ICD-10-CM | POA: Diagnosis not present

## 2012-09-17 NOTE — Assessment & Plan Note (Signed)
Wt Readings from Last 3 Encounters:  09/17/12 312 lb (141.522 kg)  08/13/12 317 lb 3.2 oz (143.881 kg)  08/13/12 317 lb (143.79 kg)

## 2012-09-17 NOTE — Assessment & Plan Note (Signed)
Better  Continue with current prn prescription therapy as reflected on the Med list.  

## 2012-09-17 NOTE — Assessment & Plan Note (Signed)
Continue with current prescription therapy as reflected on the Med list. Better 

## 2012-09-17 NOTE — Progress Notes (Signed)
Subjective:     HPI  No lightheadedness when standing up - off BP meds  The patient is here to follow up on chronic R shoulder pain, depression, anxiety, headaches and chronic moderate fibromyalgia symptoms controlled with medicines, diet and exercise.  F/u bad arthralgias - overall better  F/u severe obesity -- s/p lap band  Wt Readings from Last 3 Encounters:  09/17/12 312 lb (141.522 kg)  08/13/12 317 lb 3.2 oz (143.881 kg)  08/13/12 317 lb (143.79 kg)   BP Readings from Last 3 Encounters:  09/17/12 100/62  08/13/12 124/76  06/25/12 130/78        Review of Systems  Constitutional: Negative.  Negative for fever, chills, diaphoresis, activity change, appetite change, fatigue and unexpected weight change.  HENT: Negative for hearing loss, ear pain, nosebleeds, congestion, sore throat, facial swelling, rhinorrhea, sneezing, mouth sores, trouble swallowing, neck pain, neck stiffness, postnasal drip, sinus pressure and tinnitus.   Eyes: Negative for pain, discharge, redness, itching and visual disturbance.  Respiratory: Negative for cough, chest tightness, shortness of breath, wheezing and stridor.   Cardiovascular: Negative for chest pain, palpitations and leg swelling.  Gastrointestinal: Negative for nausea, abdominal pain, diarrhea, constipation, blood in stool, abdominal distention, anal bleeding and rectal pain.  Genitourinary: Negative for dysuria, urgency, frequency, hematuria, flank pain, vaginal bleeding, vaginal discharge, difficulty urinating, genital sores and pelvic pain.  Musculoskeletal: Positive for back pain. Negative for joint swelling, arthralgias and gait problem.  Skin: Negative.  Negative for rash.  Neurological: Negative for dizziness, tremors, seizures, syncope, speech difficulty, weakness, numbness and headaches.  Hematological: Negative for adenopathy. Does not bruise/bleed easily.  Psychiatric/Behavioral: Negative for suicidal ideas, behavioral  problems, sleep disturbance, dysphoric mood and decreased concentration. The patient is not nervous/anxious.            Objective:   Physical Exam  Constitutional: She appears well-developed. No distress.  Obese  HENT:  Head: Normocephalic.  Right Ear: External ear normal.  Left Ear: External ear normal.  Nose: Nose normal.  Mouth/Throat: Oropharynx is clear and moist.  Eyes: Conjunctivae are normal. Pupils are equal, round, and reactive to light. Right eye exhibits no discharge. Left eye exhibits no discharge.  Neck: Normal range of motion. Neck supple. No JVD present. No tracheal deviation present. No thyromegaly present.  Cardiovascular: Normal rate, regular rhythm and normal heart sounds.   Pulmonary/Chest: No stridor. No respiratory distress. She has no wheezes.  Abdominal: Soft. Bowel sounds are normal. She exhibits no distension and no mass. There is no tenderness. There is no rebound and no guarding.  Musculoskeletal: She exhibits no edema and no tenderness.  Lymphadenopathy:    She has no cervical adenopathy.  Neurological: She displays normal reflexes. No cranial nerve deficit. She exhibits normal muscle tone. Coordination normal.  Skin: No rash noted. No erythema.  Psychiatric: She has a normal mood and affect. Her behavior is normal. Judgment and thought content normal.   Lab Results  Component Value Date   WBC 10.1 06/09/2012   HGB 13.6 06/09/2012   HCT 40.7 06/09/2012   PLT 289.0 06/09/2012   GLUCOSE 105* 06/09/2012   CHOL 175 01/29/2010   TRIG 97.0 01/29/2010   HDL 36.30* 01/29/2010   LDLCALC 119* 01/29/2010   ALT 24 01/10/2012   AST 19 01/10/2012   NA 142 06/09/2012   K 4.3 06/09/2012   CL 107 06/09/2012   CREATININE 0.6 06/09/2012   BUN 9 06/09/2012   CO2 26 06/09/2012   TSH  2.32 06/09/2012   INR 1.0 08/09/2007   HGBA1C 5.7 11/05/2007          Assessment & Plan:

## 2012-09-17 NOTE — Assessment & Plan Note (Signed)
Better  

## 2012-09-17 NOTE — Assessment & Plan Note (Signed)
9/14 resolved w/wt loss -- off Rx

## 2012-10-09 ENCOUNTER — Other Ambulatory Visit: Payer: Self-pay | Admitting: Internal Medicine

## 2012-10-12 ENCOUNTER — Other Ambulatory Visit: Payer: Self-pay | Admitting: *Deleted

## 2012-10-12 MED ORDER — HYDROCODONE-ACETAMINOPHEN 5-325 MG PO TABS: ORAL_TABLET | ORAL | Status: DC

## 2012-10-13 ENCOUNTER — Other Ambulatory Visit: Payer: Self-pay | Admitting: Internal Medicine

## 2012-10-13 NOTE — Telephone Encounter (Signed)
rx has already been refilled & needs to be picked up by pt.

## 2012-10-15 ENCOUNTER — Ambulatory Visit (INDEPENDENT_AMBULATORY_CARE_PROVIDER_SITE_OTHER): Payer: Managed Care, Other (non HMO) | Admitting: Surgery

## 2012-10-15 ENCOUNTER — Encounter (INDEPENDENT_AMBULATORY_CARE_PROVIDER_SITE_OTHER): Payer: Self-pay | Admitting: Surgery

## 2012-10-15 VITALS — BP 140/74 | HR 76 | Temp 98.4°F | Resp 15 | Ht 67.0 in | Wt 311.4 lb

## 2012-10-15 DIAGNOSIS — Z6841 Body Mass Index (BMI) 40.0 and over, adult: Secondary | ICD-10-CM

## 2012-10-15 DIAGNOSIS — Z9884 Bariatric surgery status: Secondary | ICD-10-CM

## 2012-10-15 NOTE — Patient Instructions (Signed)
Thanks for your patience.  If you need further assistance after leaving the office, please call our office and speak with a [facility] nurse.  [phone].  If you want to leave a message for Dr. Susie Ehresman, please call his office phone at [phone]. 

## 2012-10-15 NOTE — Progress Notes (Signed)
Lapband Fill Encounter  We discussed her current weight which is 311.4. She's lost 85 pounds since surgery on January of 2014. She knows what happened this led to a lesser weight loss in the last 2 months and then she went to a family reunion and 8 carbs and hadn't been exercising. She has since restarted his exercises and is eating properly. She seems tight and so I don't think I want to overfill her. Problem List:   Patient Active Problem List   Diagnosis Date Noted  . Orthostatic lightheadedness 06/09/2012  . Venous insufficiency of leg 03/04/2012  . Lapband APS Jan 2014 01/21/2012  . Arthralgia 10/04/2011  . Night sweats 06/05/2010  . ANAL OR RECTAL PAIN 01/05/2010  . HEMATOCHEZIA 01/05/2010  . ELBOW PAIN 10/31/2009  . UPPER RESPIRATORY INFECTION, ACUTE 12/06/2008  . SHOULDER PAIN 12/06/2008  . CONTACT DERMATITIS 06/03/2008  . OBESITY, MORBID 05/04/2008  . TOBACCO USE DISORDER/SMOKER-SMOKING CESSATION DISCUSSED 07/28/2007  . ANXIETY 12/26/2006  . PANIC ATTACK 12/26/2006  . HYPERTENSION 12/26/2006  . ASTHMA 12/26/2006  . DEPRESSION 08/28/2006  . LOW BACK PAIN 08/28/2006    [name] Body mass index is 48.76 kg/(m^2). Weight loss since surgery  85.6 pounds  Having regurgitation?:  occasionally  Feel that they need a fill?  no  Nocturnal reflux?  no  Amount of fill  0     Instructions given and weight loss goals discussed.    Encouraged to learn by these experiences. We will see her back in 2 months.  Matt B. Daphine Deutscher, MD, FACS

## 2012-10-20 IMAGING — CR DG ABDOMEN ACUTE W/ 1V CHEST
4 series · 4 of 4 positions shown · non-contrast
Comparison: 06/05/2010

CLINICAL DATA: Left upper quadrant pain

ACUTE ABDOMEN SERIES (ABDOMEN 2 VIEW & CHEST 1 VIEW)

[w chest pa *]
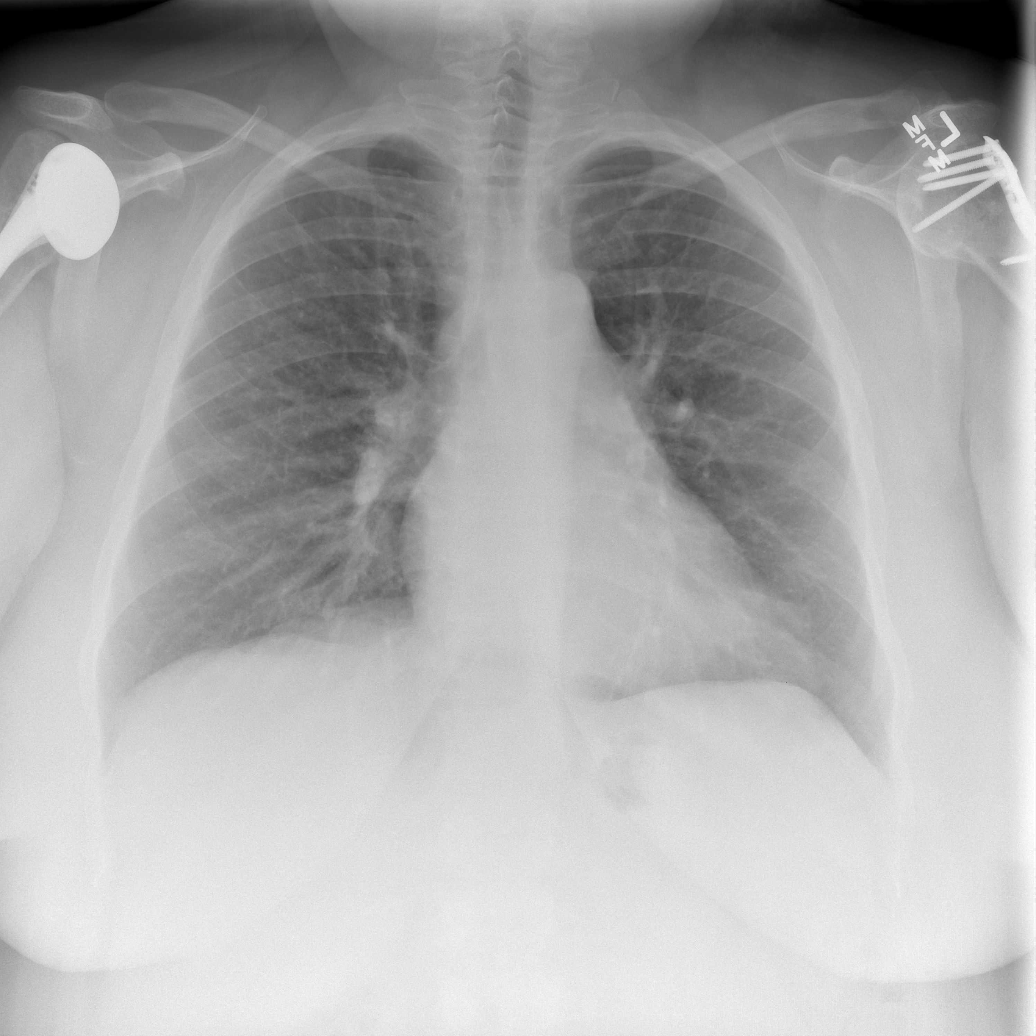

[w abdomen upright *]
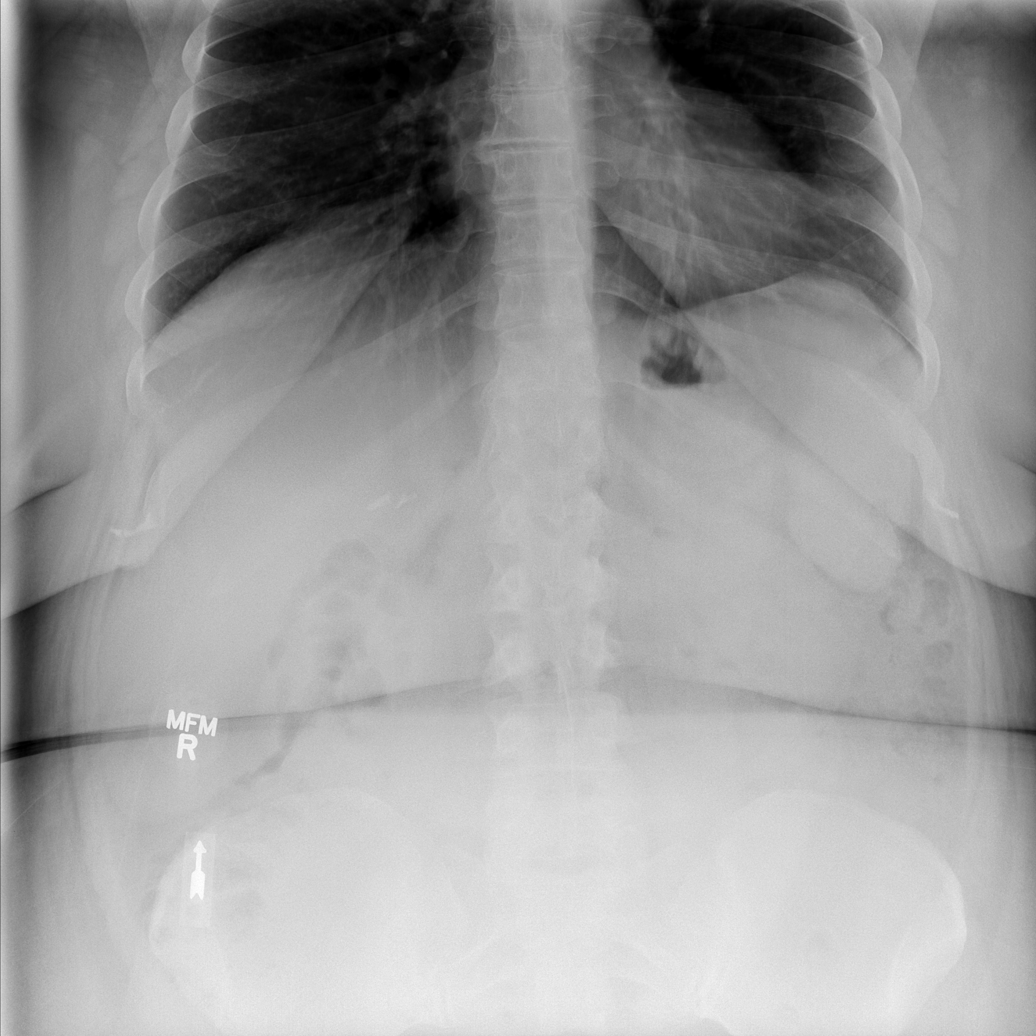

[t abdomen supine * (1 of 2)]
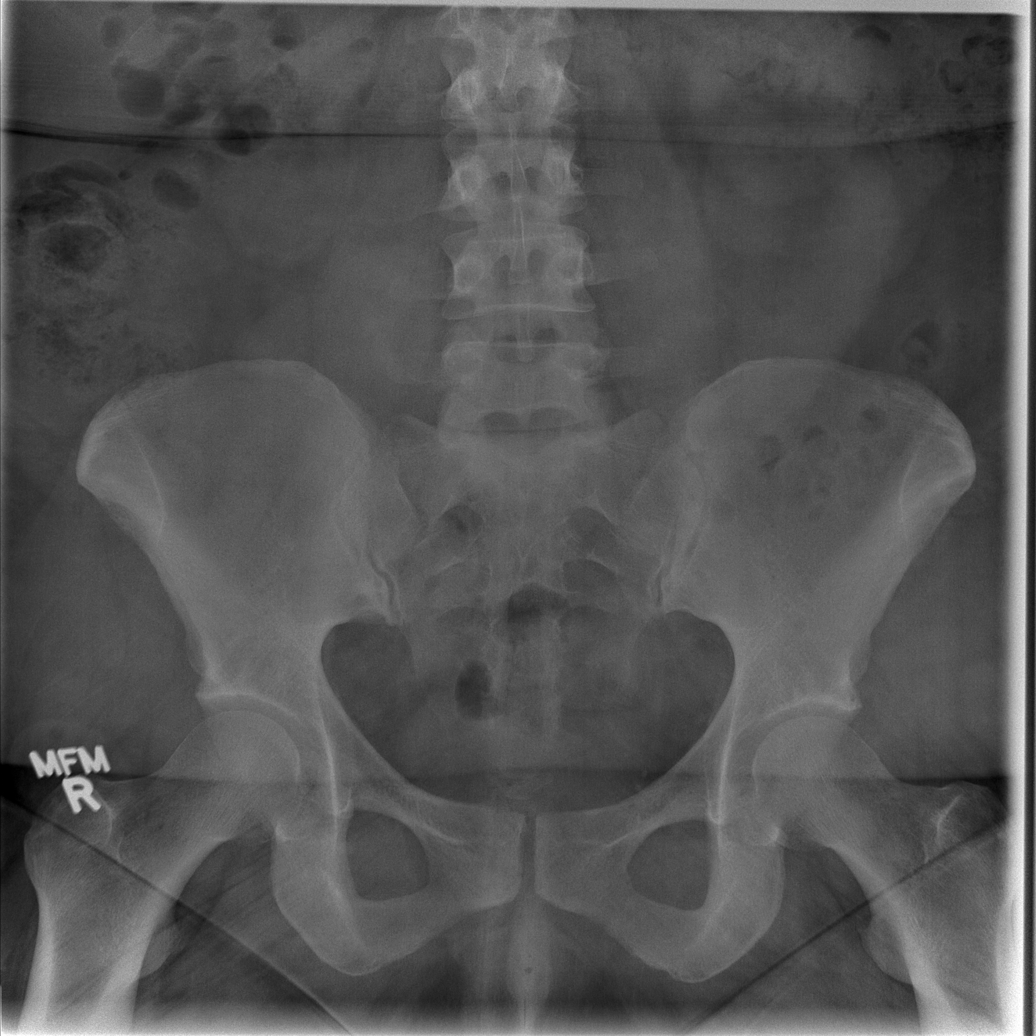

[t abdomen supine * (2 of 2)]
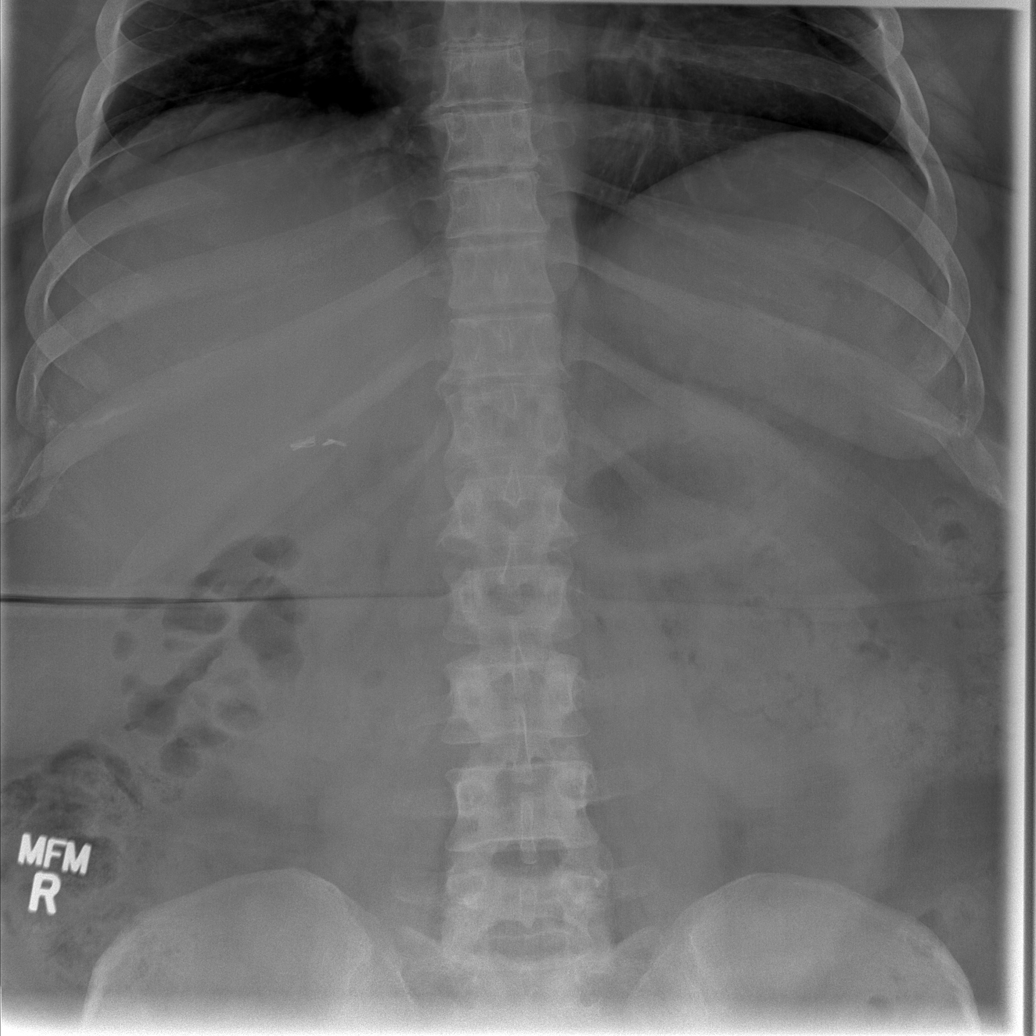

[4 of 4 positions shown; findings below may reference images not displayed]

FINDINGS: The lungs are clear without focal infiltrate, edema,
pneumothorax or pleural effusion. Interstitial markings are
diffusely coarsened with chronic features. Cardiopericardial
silhouette is at upper limits of normal for size.  Surgical changes
are noted in both shoulders.

Upright film shows no evidence for intraperitoneal free air.
Supine view of the abdomen shows no gaseous bowel dilatation to
suggest obstruction.  No unexpected abdominopelvic calcification.
Visualized bony structures are unremarkable.
IMPRESSION: No acute cardiopulmonary process.

No evidence for bowel obstruction or perforation.

## 2012-11-13 ENCOUNTER — Telehealth: Payer: Self-pay

## 2012-11-13 ENCOUNTER — Telehealth: Payer: Self-pay | Admitting: *Deleted

## 2012-11-13 MED ORDER — ALPRAZOLAM 1 MG PO TABS: ORAL_TABLET | ORAL | Status: DC

## 2012-11-13 MED ORDER — HYDROCODONE-ACETAMINOPHEN 5-325 MG PO TABS: ORAL_TABLET | ORAL | Status: DC

## 2012-11-13 NOTE — Telephone Encounter (Signed)
OK to fill both prescriptions with additional refills x0 Thank you!  

## 2012-11-13 NOTE — Telephone Encounter (Signed)
Patient informed. 

## 2012-11-13 NOTE — Telephone Encounter (Signed)
No. Sorry! AP

## 2012-11-13 NOTE — Telephone Encounter (Signed)
Pt called requesting Hydrocodone and Xanax refill.  Please advise

## 2012-11-13 NOTE — Telephone Encounter (Signed)
Patient wanted to know if the hydrocodone prescription could be changed from take 1 tab by mouth twice a day as needed for pain to take 1 every 6 hours as needed for pain.  Please advise, Thanks!

## 2012-11-16 NOTE — Telephone Encounter (Signed)
Left message on VM advising Rx ready 

## 2012-12-15 ENCOUNTER — Other Ambulatory Visit: Payer: Self-pay | Admitting: Internal Medicine

## 2012-12-15 NOTE — Telephone Encounter (Signed)
Pt called requesting Hydrocodone refill.  Please advise 

## 2012-12-16 ENCOUNTER — Telehealth: Payer: Self-pay | Admitting: *Deleted

## 2012-12-16 MED ORDER — HYDROCODONE-ACETAMINOPHEN 5-325 MG PO TABS: ORAL_TABLET | ORAL | Status: DC

## 2012-12-16 NOTE — Telephone Encounter (Signed)
Sorry it was an error. Hydrocod was refilled Thx

## 2012-12-16 NOTE — Telephone Encounter (Signed)
Pt states she takes hydrocodone.  She doesn't like the oxycodone.

## 2012-12-16 NOTE — Telephone Encounter (Signed)
Pt called requesting Oxycodone refill.  Please advise 

## 2012-12-17 ENCOUNTER — Encounter (INDEPENDENT_AMBULATORY_CARE_PROVIDER_SITE_OTHER): Payer: Managed Care, Other (non HMO) | Admitting: Surgery

## 2012-12-17 ENCOUNTER — Ambulatory Visit (INDEPENDENT_AMBULATORY_CARE_PROVIDER_SITE_OTHER): Payer: Managed Care, Other (non HMO) | Admitting: Physician Assistant

## 2012-12-17 ENCOUNTER — Encounter (INDEPENDENT_AMBULATORY_CARE_PROVIDER_SITE_OTHER): Payer: Self-pay

## 2012-12-17 VITALS — BP 126/80 | HR 72 | Temp 97.6°F | Resp 18 | Ht 67.5 in | Wt 307.0 lb

## 2012-12-17 DIAGNOSIS — Z9884 Bariatric surgery status: Secondary | ICD-10-CM

## 2012-12-17 NOTE — Telephone Encounter (Signed)
Left VM Rx ready

## 2012-12-17 NOTE — Patient Instructions (Signed)
Return in one month. Focus on good food choices as well as physical activity. Return sooner if you have an increase in hunger, portion sizes or weight. Return also for difficulty swallowing, night cough, reflux.   

## 2012-12-17 NOTE — Progress Notes (Signed)
  HISTORY: Stephanie Harper is a 42 y.o.female who received an AP-Standard lap-band in January 2014 by Dr. Daphine Deutscher. She comes in with just over 4 lbs weight loss since her last visit in October. This is her first visit with me. She denies persistent hunger or larger portion sizes but she ate with increased frequency over the Thanksgiving holiday. She has frequent 'last bite' regurgitation which she attributes to still learning her limits with the band. She is exercising on a regular basis, mostly in the pool at her gym. She mentioned her port site occasionally itching but no pain.  VITAL SIGNS: Filed Vitals:   12/17/12 1050  BP: 126/80  Pulse: 72  Temp: 97.6 F (36.4 C)  Resp: 18    PHYSICAL EXAM: Physical exam reveals a very well-appearing 42 y.o.female in no apparent distress Neurologic: Awake, alert, oriented Psych: Bright affect, conversant Respiratory: Breathing even and unlabored. No stridor or wheezing Extremities: Atraumatic, good range of motion. Skin: Warm, Dry, no rashes Musculoskeletal: Normal gait, Joints normal Abdomen: Soft, nontender, nondistended. Port easily palpated. Incision well-healed. No erythema or tenderness. Skin is a bit dry near the incision site.  ASSESMENT: 42 y.o.  female  s/p AP-Standard lap-band.   PLAN: I offered her the choice of leaving her band as it is, to give her time to work on eating behaviors and listening for the subtle 'signal' that she's full, vs. removal of a small volume of fluid. She did not want fluid removed today. We'll have her return in one month for her annual visit and re-evaluate at that time. I gave her return instructions if her symptoms worsen. I suggested using moisturizing cream near the incision for itching.

## 2013-01-06 ENCOUNTER — Other Ambulatory Visit: Payer: Self-pay | Admitting: Internal Medicine

## 2013-01-14 ENCOUNTER — Encounter (INDEPENDENT_AMBULATORY_CARE_PROVIDER_SITE_OTHER): Payer: Self-pay

## 2013-01-14 ENCOUNTER — Ambulatory Visit (INDEPENDENT_AMBULATORY_CARE_PROVIDER_SITE_OTHER): Payer: BC Managed Care – PPO | Admitting: Physician Assistant

## 2013-01-14 VITALS — BP 128/82 | HR 80 | Temp 98.4°F | Resp 14 | Ht 67.5 in | Wt 301.2 lb

## 2013-01-14 DIAGNOSIS — Z4651 Encounter for fitting and adjustment of gastric lap band: Secondary | ICD-10-CM | POA: Diagnosis not present

## 2013-01-14 NOTE — Patient Instructions (Signed)
Return in two months. Focus on good food choices as well as physical activity. Return sooner if you have an increase in hunger, portion sizes or weight. Return also for difficulty swallowing, night cough, reflux.

## 2013-01-14 NOTE — Progress Notes (Signed)
  HISTORY: Marlon Vonruden is a 43 y.o.female who received an AP-Large lap-band in January 2014 by Dr. Hassell Done. She comes in with 5 lbs further weight loss since her last visit. She has had no change in her 'first bite syndrome' despite chewing and eating slowly. Her exercise is down early this year but she intends on picking up the frequency of exercise.  VITAL SIGNS: Filed Vitals:   01/14/13 1339  BP: 128/82  Pulse: 80  Temp: 98.4 F (36.9 C)  Resp: 14    PHYSICAL EXAM: Physical exam reveals a very well-appearing 43 y.o.female in no apparent distress Neurologic: Awake, alert, oriented Psych: Bright affect, conversant Respiratory: Breathing even and unlabored. No stridor or wheezing Abdomen: Soft, nontender, nondistended to palpation. Incisions well-healed. No incisional hernias. Port easily palpated. Extremities: Atraumatic, good range of motion.  ASSESMENT: 43 y.o.  female  s/p AP-Large lap-band.   PLAN: The patient's port was accessed with a 20G Huber needle without difficulty. Clear fluid was aspirated and 0.2 mL saline was removed from the port. I'm hopeful that this will help her to eat nutritious foods without inordinately increasing her hunger or portions. The patient was advised to concentrate on healthy food choices and to avoid slider foods high in fats and carbohydrates. Today is her one year anniversary appointment for which she's lost almost 96 lbs.

## 2013-01-18 ENCOUNTER — Ambulatory Visit (INDEPENDENT_AMBULATORY_CARE_PROVIDER_SITE_OTHER): Payer: BC Managed Care – PPO | Admitting: Internal Medicine

## 2013-01-18 ENCOUNTER — Other Ambulatory Visit (INDEPENDENT_AMBULATORY_CARE_PROVIDER_SITE_OTHER): Payer: BC Managed Care – PPO

## 2013-01-18 ENCOUNTER — Encounter: Payer: Self-pay | Admitting: Internal Medicine

## 2013-01-18 VITALS — BP 118/72 | HR 80 | Temp 98.0°F | Resp 16 | Wt 306.0 lb

## 2013-01-18 DIAGNOSIS — M545 Low back pain, unspecified: Secondary | ICD-10-CM

## 2013-01-18 DIAGNOSIS — F3289 Other specified depressive episodes: Secondary | ICD-10-CM

## 2013-01-18 DIAGNOSIS — F329 Major depressive disorder, single episode, unspecified: Secondary | ICD-10-CM

## 2013-01-18 LAB — CBC WITH DIFFERENTIAL/PLATELET
BASOS PCT: 0.7 % (ref 0.0–3.0)
Basophils Absolute: 0.1 10*3/uL (ref 0.0–0.1)
Eosinophils Absolute: 0.1 10*3/uL (ref 0.0–0.7)
Eosinophils Relative: 1.1 % (ref 0.0–5.0)
HCT: 41.8 % (ref 36.0–46.0)
Hemoglobin: 14.1 g/dL (ref 12.0–15.0)
LYMPHS PCT: 21.3 % (ref 12.0–46.0)
Lymphs Abs: 2 10*3/uL (ref 0.7–4.0)
MCHC: 33.8 g/dL (ref 30.0–36.0)
MCV: 88.9 fl (ref 78.0–100.0)
MONOS PCT: 8.3 % (ref 3.0–12.0)
Monocytes Absolute: 0.8 10*3/uL (ref 0.1–1.0)
Neutro Abs: 6.5 10*3/uL (ref 1.4–7.7)
Neutrophils Relative %: 68.6 % (ref 43.0–77.0)
Platelets: 262 10*3/uL (ref 150.0–400.0)
RBC: 4.71 Mil/uL (ref 3.87–5.11)
RDW: 13.3 % (ref 11.5–14.6)
WBC: 9.5 10*3/uL (ref 4.5–10.5)

## 2013-01-18 LAB — BASIC METABOLIC PANEL
BUN: 11 mg/dL (ref 6–23)
CHLORIDE: 106 meq/L (ref 96–112)
CO2: 28 mEq/L (ref 19–32)
Calcium: 9.2 mg/dL (ref 8.4–10.5)
Creatinine, Ser: 0.6 mg/dL (ref 0.4–1.2)
GFR: 128.92 mL/min (ref >60.00)
Glucose, Bld: 94 mg/dL (ref 70–99)
Potassium: 4.3 mEq/L (ref 3.5–5.1)
SODIUM: 140 meq/L (ref 135–145)

## 2013-01-18 MED ORDER — HYDROCODONE-ACETAMINOPHEN 5-325 MG PO TABS: ORAL_TABLET | ORAL | Status: DC

## 2013-01-18 NOTE — Assessment & Plan Note (Signed)
Wt Readings from Last 3 Encounters:  01/18/13 306 lb (138.801 kg)  01/14/13 301 lb 3.2 oz (136.623 kg)  12/17/12 307 lb (139.254 kg)

## 2013-01-18 NOTE — Progress Notes (Signed)
Pre visit review using our clinic review tool, if applicable. No additional management support is needed unless otherwise documented below in the visit note. 

## 2013-01-18 NOTE — Assessment & Plan Note (Signed)
Continue with current prescription therapy as reflected on the Med list.  

## 2013-01-18 NOTE — Progress Notes (Signed)
Subjective:     HPI  No lightheadedness when standing up - off BP meds  The patient is here to follow up on chronic R shoulder pain, depression, anxiety, headaches and chronic moderate fibromyalgia symptoms controlled with medicines, diet and exercise.  F/u bad arthralgias - overall better  F/u severe obesity -- s/p lap band  Wt Readings from Last 3 Encounters:  01/18/13 306 lb (138.801 kg)  01/14/13 301 lb 3.2 oz (136.623 kg)  12/17/12 307 lb (139.254 kg)   BP Readings from Last 3 Encounters:  01/18/13 118/72  01/14/13 128/82  12/17/12 126/80        Review of Systems  Constitutional: Negative.  Negative for fever, chills, diaphoresis, activity change, appetite change, fatigue and unexpected weight change.  HENT: Negative for congestion, ear pain, facial swelling, hearing loss, mouth sores, nosebleeds, postnasal drip, rhinorrhea, sinus pressure, sneezing, sore throat, tinnitus and trouble swallowing.   Eyes: Negative for pain, discharge, redness, itching and visual disturbance.  Respiratory: Negative for cough, chest tightness, shortness of breath, wheezing and stridor.   Cardiovascular: Negative for chest pain, palpitations and leg swelling.  Gastrointestinal: Negative for nausea, abdominal pain, diarrhea, constipation, blood in stool, abdominal distention, anal bleeding and rectal pain.  Genitourinary: Negative for dysuria, urgency, frequency, hematuria, flank pain, vaginal bleeding, vaginal discharge, difficulty urinating, genital sores and pelvic pain.  Musculoskeletal: Positive for back pain. Negative for arthralgias, gait problem, joint swelling, neck pain and neck stiffness.  Skin: Negative.  Negative for rash.  Neurological: Negative for dizziness, tremors, seizures, syncope, speech difficulty, weakness, numbness and headaches.  Hematological: Negative for adenopathy. Does not bruise/bleed easily.  Psychiatric/Behavioral: Negative for suicidal ideas, behavioral  problems, sleep disturbance, dysphoric mood and decreased concentration. The patient is not nervous/anxious.            Objective:   Physical Exam  Constitutional: She appears well-developed. No distress.  Obese  HENT:  Head: Normocephalic.  Right Ear: External ear normal.  Left Ear: External ear normal.  Nose: Nose normal.  Mouth/Throat: Oropharynx is clear and moist.  Eyes: Conjunctivae are normal. Pupils are equal, round, and reactive to light. Right eye exhibits no discharge. Left eye exhibits no discharge.  Neck: Normal range of motion. Neck supple. No JVD present. No tracheal deviation present. No thyromegaly present.  Cardiovascular: Normal rate, regular rhythm and normal heart sounds.   Pulmonary/Chest: No stridor. No respiratory distress. She has no wheezes.  Abdominal: Soft. Bowel sounds are normal. She exhibits no distension and no mass. There is no tenderness. There is no rebound and no guarding.  Musculoskeletal: She exhibits no edema and no tenderness.  Lymphadenopathy:    She has no cervical adenopathy.  Neurological: She displays normal reflexes. No cranial nerve deficit. She exhibits normal muscle tone. Coordination normal.  Skin: No rash noted. No erythema.  Psychiatric: She has a normal mood and affect. Her behavior is normal. Judgment and thought content normal.   Lab Results  Component Value Date   WBC 10.1 06/09/2012   HGB 13.6 06/09/2012   HCT 40.7 06/09/2012   PLT 289.0 06/09/2012   GLUCOSE 105* 06/09/2012   CHOL 175 01/29/2010   TRIG 97.0 01/29/2010   HDL 36.30* 01/29/2010   LDLCALC 119* 01/29/2010   ALT 24 01/10/2012   AST 19 01/10/2012   NA 142 06/09/2012   K 4.3 06/09/2012   CL 107 06/09/2012   CREATININE 0.6 06/09/2012   BUN 9 06/09/2012   CO2 26 06/09/2012   TSH  2.32 06/09/2012   INR 1.0 08/09/2007   HGBA1C 5.7 11/05/2007          Assessment & Plan:

## 2013-03-11 ENCOUNTER — Ambulatory Visit (INDEPENDENT_AMBULATORY_CARE_PROVIDER_SITE_OTHER): Payer: BC Managed Care – PPO | Admitting: Physician Assistant

## 2013-03-11 ENCOUNTER — Encounter (INDEPENDENT_AMBULATORY_CARE_PROVIDER_SITE_OTHER): Payer: Self-pay | Admitting: Physician Assistant

## 2013-03-11 VITALS — BP 122/76 | HR 80 | Temp 97.0°F | Resp 18 | Ht 67.5 in | Wt 311.1 lb

## 2013-03-11 DIAGNOSIS — Z4651 Encounter for fitting and adjustment of gastric lap band: Secondary | ICD-10-CM | POA: Diagnosis not present

## 2013-03-11 NOTE — Patient Instructions (Signed)
Take clear liquids for the remainder of the day. Thin protein shakes are ok to start this evening for your evening meal. Have one also for breakfast tomorrow morning. Try yogurt, cottage cheese or foods of similar consistency for lunch tomorrow. Slowly and carefully advance your diet thereafter. Call us if you have persistent vomiting or regurgitation, night cough or reflux symptoms. Return as scheduled or sooner if you notice no changes in hunger/portion sizes.

## 2013-03-11 NOTE — Progress Notes (Signed)
  HISTORY: Stephanie Harper is a 43 y.o.female who received an AP-Standard lap-band in January 2014 by Dr. Hassell Done. She comes in today with 10 lbs weight gain since her last visit in early January. We removed 0.2 mL fluid at that time for what appeared to be first bite syndrome. She has no further complaints of that but has noticed that her hunger has increased. She has been reaching for carbs more frequently lately due to her seasonal depression. She is getting outside on brighter days and does well then. She wants some fluid replaced to get her weight and eating under better control.  VITAL SIGNS: Filed Vitals:   03/11/13 1329  BP: 122/76  Pulse: 80  Temp: 97 F (36.1 C)  Resp: 18    PHYSICAL EXAM: Physical exam reveals a very well-appearing 43 y.o.female in no apparent distress Neurologic: Awake, alert, oriented Psych: Bright affect, conversant Respiratory: Breathing even and unlabored. No stridor or wheezing Abdomen: Soft, nontender, nondistended to palpation. Incisions well-healed. No incisional hernias. Port easily palpated. Extremities: Atraumatic, good range of motion.  ASSESMENT: 43 y.o.  female  s/p AP-Standard lap-band.   PLAN: The patient's port was accessed with a 20G Huber needle without difficulty. Clear fluid was aspirated and 0.2 mL saline was added to the port. The patient was able to swallow water without difficulty following the procedure and was instructed to take clear liquids for the next 24-48 hours and advance slowly as tolerated. We discussed strategies to avoid first bite syndrome including small bites and adequate chewing. We'll have her back in two months.

## 2013-03-13 ENCOUNTER — Other Ambulatory Visit: Payer: Self-pay | Admitting: Internal Medicine

## 2013-03-17 ENCOUNTER — Telehealth: Payer: Self-pay | Admitting: *Deleted

## 2013-03-17 MED ORDER — ALPRAZOLAM 1 MG PO TABS: ORAL_TABLET | ORAL | Status: DC

## 2013-03-17 NOTE — Telephone Encounter (Signed)
Patient phoned requesting xanax refill.  Last OV with PCP 01/18/13 and med last ordered 12/15/12.  Please advise.  CB# [phone]

## 2013-03-17 NOTE — Telephone Encounter (Signed)
OK to fill this prescription with additional refills x2 Thank you!

## 2013-03-17 NOTE — Telephone Encounter (Signed)
Phoned in xanax script

## 2013-04-08 IMAGING — MR MR KNEE*R* W/O CM
4 of 5 series · 19 of 40 positions shown · non-contrast
Comparison: None.

CLINICAL DATA: Posterior knee pain for 2 weeks.  No acute injury or
prior relevant surgery.

MRI OF THE RIGHT KNEE WITHOUT CONTRAST
TECHNIQUE: Multiplanar, multisequence MR imaging was performed. No
intravenous contrast was administered.

[Series 3: pd_tse_fs_tra · axial · 3.5mm · 0.39mm/px · z∈[-19,+43]mm · 3 of 25 slices shown]
[im 5/25]
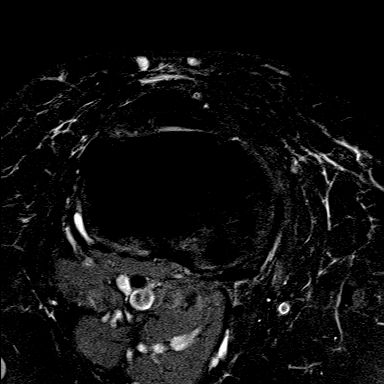
[im 13/25]
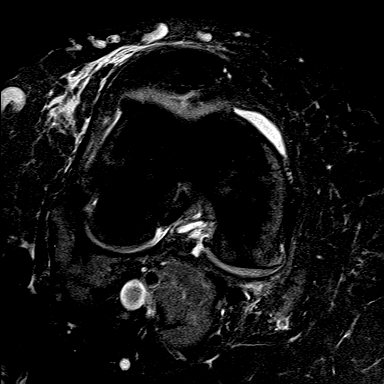
[im 21/25]
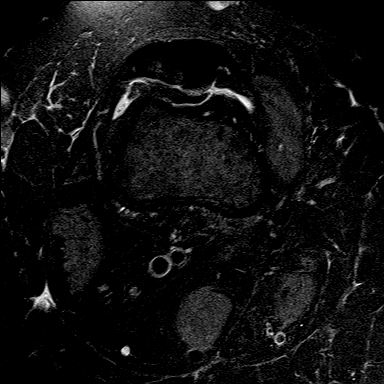

[Series 5: T2 fat-sat · coronal · 3.0mm · 0.29mm/px · 7 of 26 slices shown]
[im 1/26]
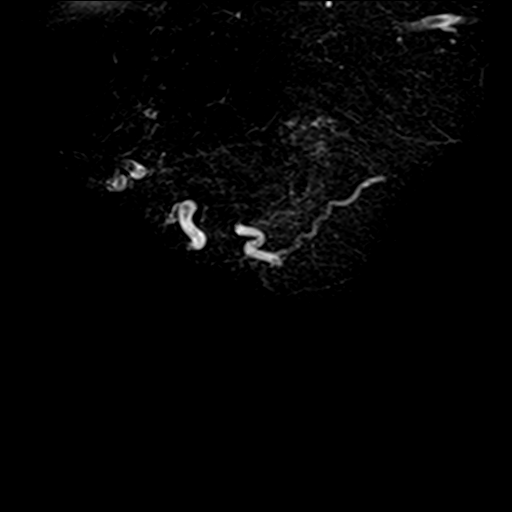
[im 4/26]
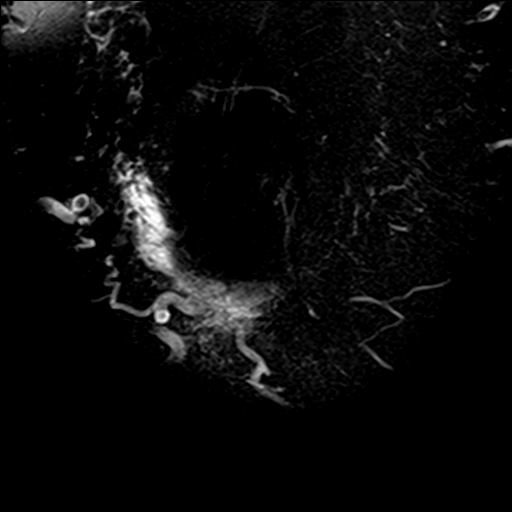
[im 7/26]
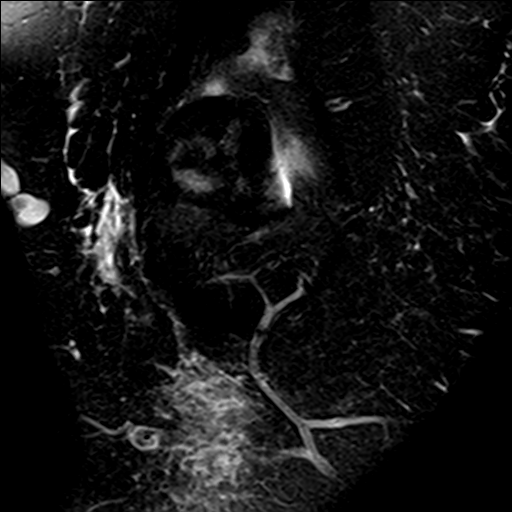
[im 10/26]
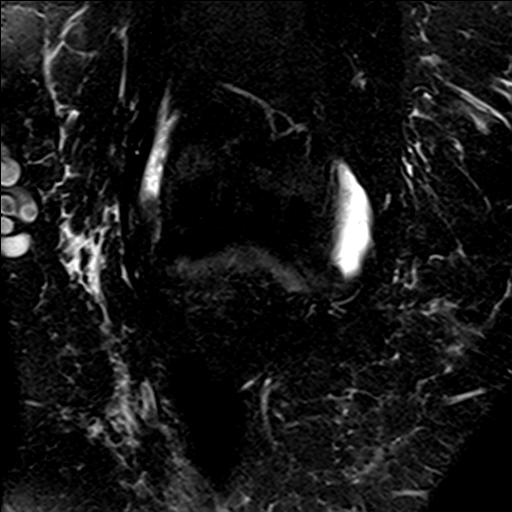
[im 13/26]
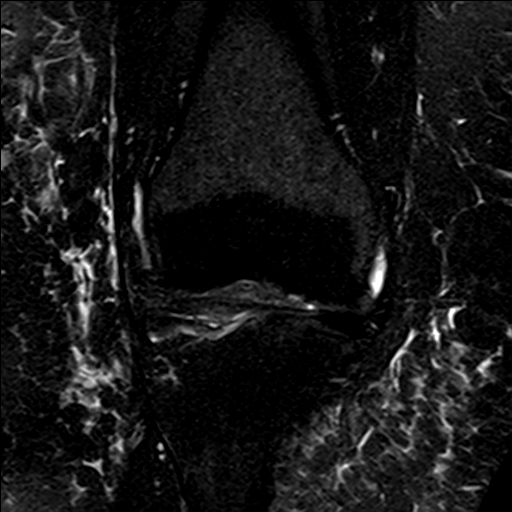
[im 16/26]
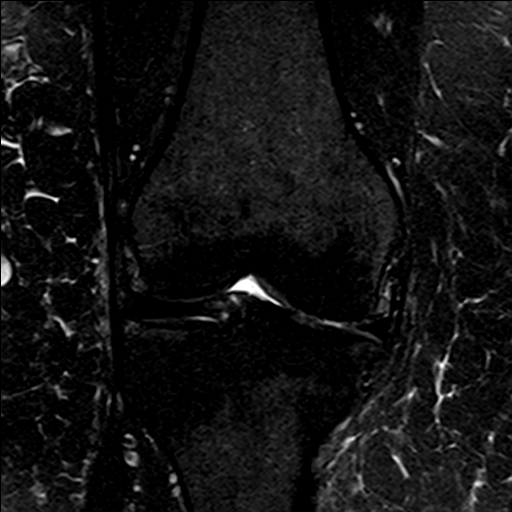
[im 22/26]
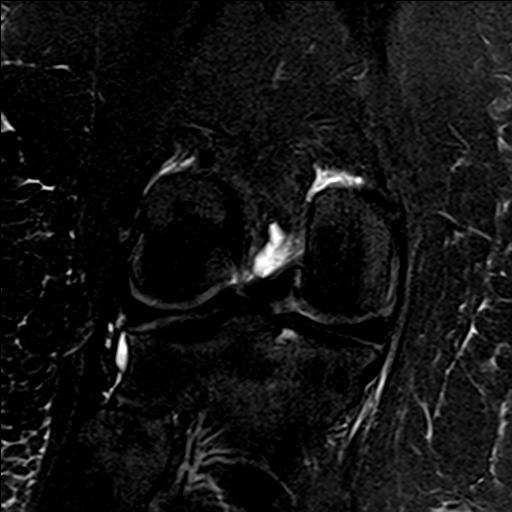

[Series 6: T1 · coronal · 3.0mm · 0.23mm/px · 3 of 26 slices shown]
[im 4/26]
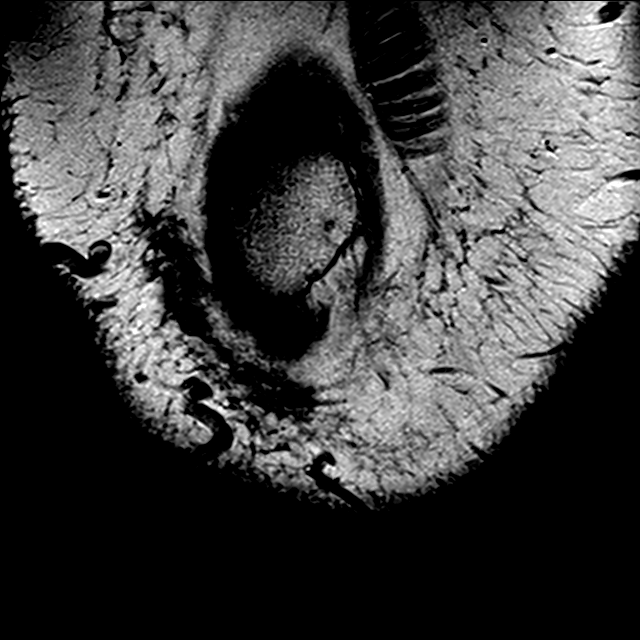
[im 13/26]
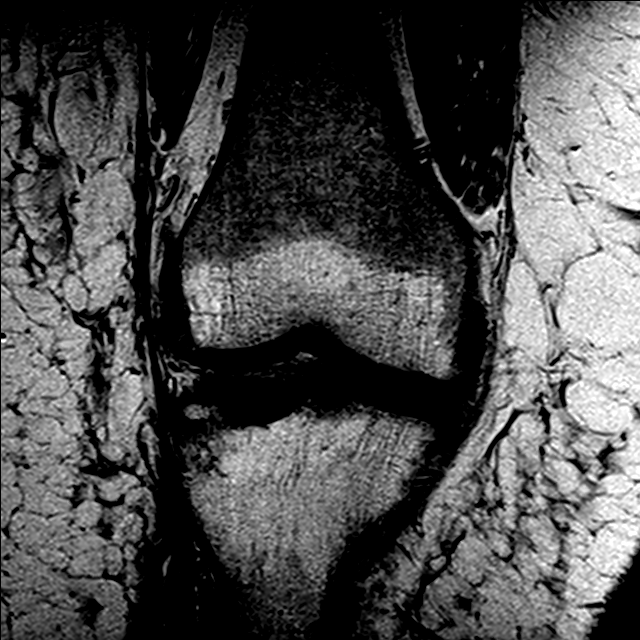
[im 22/26]
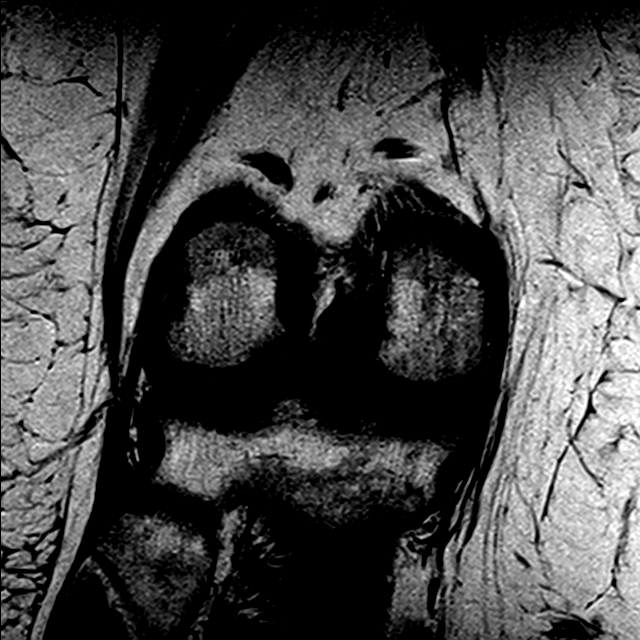

[Series 7: PD fat-sat · sagittal · 4.0mm · 0.31mm/px · 6 of 19 slices shown]
[im 1/19]
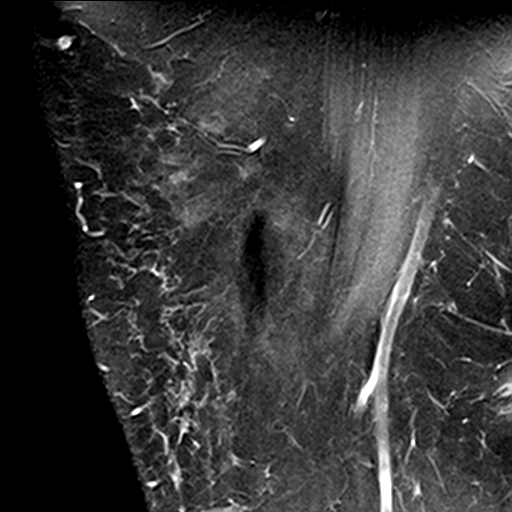
[im 4/19]
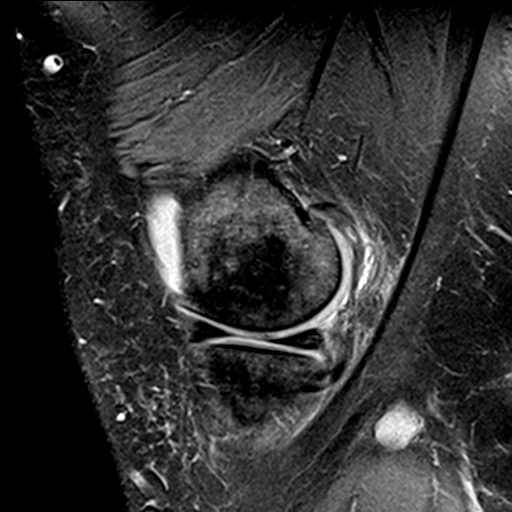
[im 8/19]
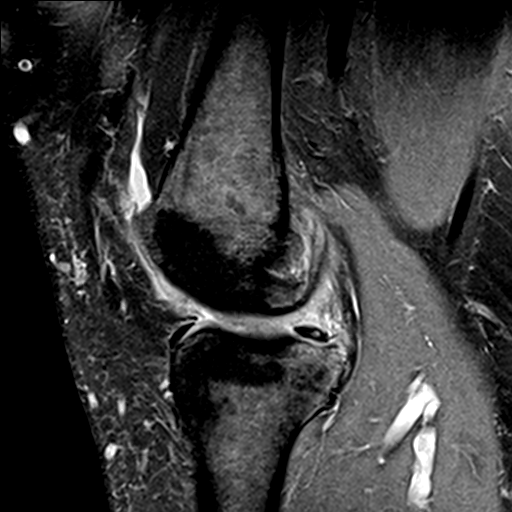
[im 11/19]
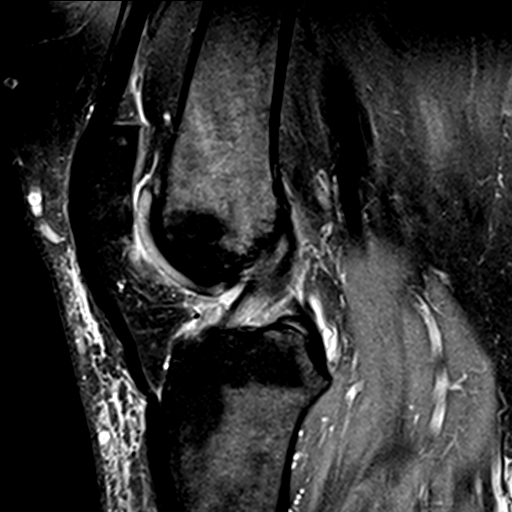
[im 15/19]
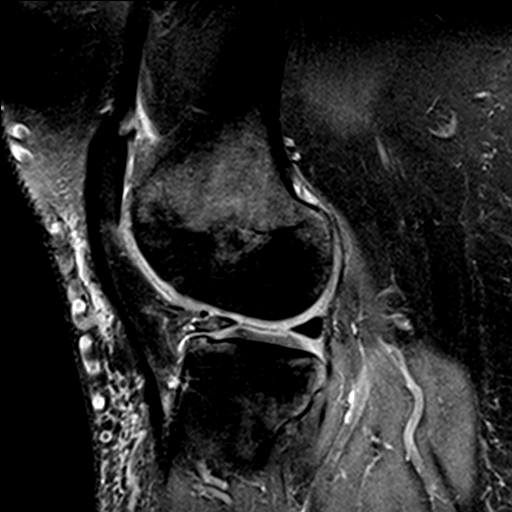
[im 19/19]
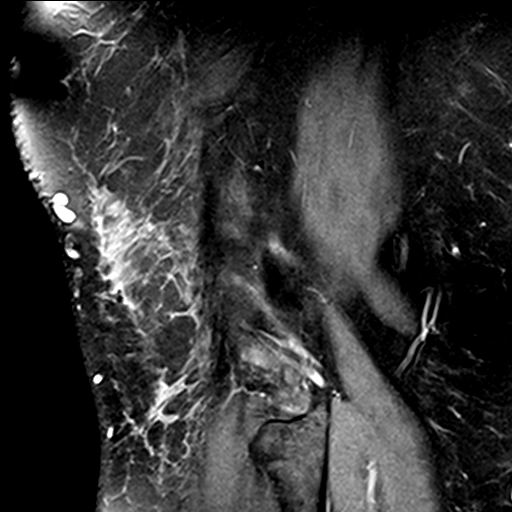

[19 of 40 positions shown; findings below may reference images not displayed]

FINDINGS: There is a minimal knee joint effusion and a small
Baker's cyst.  There is mild subcutaneous edema anteriorly.
Superficial varicosities are most prominent anterolaterally.

The extensor mechanism is intact.  There are moderately advanced
patellofemoral degenerative changes with osteophytes and
subchondral cyst formation, especially in the lateral patellar
facet.  The trochlear cartilage appears fairly well maintained.

There is mild medial compartment degenerative chondrosis.  Changes
of marrow reconversion are noted within the distal tibia and
proximal tibia.  There is no evidence of acute fracture.  There is
mild degeneration of the body of the medial meniscus and anterior
horn of the lateral meniscus.  There is no evidence of meniscal
tear.  The cruciate and collateral ligaments are intact.
IMPRESSION: 1.  Moderate patellofemoral and mild medial compartment
degenerative chondrosis.
2.  No acute osteochondral findings.
3.  Mild meniscal degeneration without evidence of tear.
4.  Intact knee ligaments.

## 2013-04-22 ENCOUNTER — Ambulatory Visit: Payer: BC Managed Care – PPO | Admitting: Internal Medicine

## 2013-04-23 ENCOUNTER — Ambulatory Visit (INDEPENDENT_AMBULATORY_CARE_PROVIDER_SITE_OTHER): Payer: BC Managed Care – PPO | Admitting: Internal Medicine

## 2013-04-23 ENCOUNTER — Encounter: Payer: Self-pay | Admitting: Internal Medicine

## 2013-04-23 VITALS — BP 118/76 | HR 76 | Temp 98.5°F | Resp 16 | Wt 301.0 lb

## 2013-04-23 DIAGNOSIS — F411 Generalized anxiety disorder: Secondary | ICD-10-CM | POA: Diagnosis not present

## 2013-04-23 MED ORDER — HYDROCODONE-ACETAMINOPHEN 5-325 MG PO TABS: ORAL_TABLET | ORAL | Status: DC

## 2013-04-23 MED ORDER — HYDROCODONE-ACETAMINOPHEN 5-325 MG PO TABS: 1.0000 | ORAL_TABLET | Freq: Three times a day (TID) | ORAL | Status: DC | PRN

## 2013-04-23 MED ORDER — FLUTICASONE FUROATE-VILANTEROL 100-25 MCG/INH IN AEPB: 1.0000 | INHALATION_SPRAY | Freq: Every day | RESPIRATORY_TRACT | Status: DC

## 2013-04-23 MED ORDER — AZITHROMYCIN 250 MG PO TABS: ORAL_TABLET | ORAL | Status: DC

## 2013-04-23 MED ORDER — ALPRAZOLAM 1 MG PO TABS: ORAL_TABLET | ORAL | Status: DC

## 2013-04-23 MED ORDER — ALBUTEROL SULFATE HFA 108 (90 BASE) MCG/ACT IN AERS: 2.0000 | INHALATION_SPRAY | Freq: Four times a day (QID) | RESPIRATORY_TRACT | Status: DC | PRN

## 2013-04-23 NOTE — Assessment & Plan Note (Signed)
Continue with current prn prescription therapy as reflected on the Med list.  

## 2013-04-23 NOTE — Progress Notes (Signed)
Pre visit review using our clinic review tool, if applicable. No additional management support is needed unless otherwise documented below in the visit note. 

## 2013-04-23 NOTE — Progress Notes (Signed)
Subjective:     HPI  C/o cough x 2 mo  C/o worsening depression  No lightheadedness when standing up - off BP meds  The patient is here to follow up on chronic R shoulder pain, depression, anxiety, headaches and chronic moderate fibromyalgia symptoms controlled with medicines, diet and exercise.  F/u bad arthralgias - overall better  F/u severe obesity -- s/p lap band  Wt Readings from Last 3 Encounters:  04/23/13 301 lb (136.533 kg)  03/11/13 311 lb 2 oz (141.125 kg)  01/18/13 306 lb (138.801 kg)   BP Readings from Last 3 Encounters:  04/23/13 118/76  03/11/13 122/76  01/18/13 118/72        Review of Systems  Constitutional: Negative.  Negative for fever, chills, diaphoresis, activity change, appetite change, fatigue and unexpected weight change.  HENT: Negative for congestion, ear pain, facial swelling, hearing loss, mouth sores, nosebleeds, postnasal drip, rhinorrhea, sinus pressure, sneezing, sore throat, tinnitus and trouble swallowing.   Eyes: Negative for pain, discharge, redness, itching and visual disturbance.  Respiratory: Negative for cough, chest tightness, shortness of breath, wheezing and stridor.   Cardiovascular: Negative for chest pain, palpitations and leg swelling.  Gastrointestinal: Negative for nausea, abdominal pain, diarrhea, constipation, blood in stool, abdominal distention, anal bleeding and rectal pain.  Genitourinary: Negative for dysuria, urgency, frequency, hematuria, flank pain, vaginal bleeding, vaginal discharge, difficulty urinating, genital sores and pelvic pain.  Musculoskeletal: Positive for back pain. Negative for arthralgias, gait problem, joint swelling, neck pain and neck stiffness.  Skin: Negative.  Negative for rash.  Neurological: Negative for dizziness, tremors, seizures, syncope, speech difficulty, weakness, numbness and headaches.  Hematological: Negative for adenopathy. Does not bruise/bleed easily.   Psychiatric/Behavioral: Negative for suicidal ideas, behavioral problems, sleep disturbance, dysphoric mood and decreased concentration. The patient is not nervous/anxious.            Objective:   Physical Exam  Constitutional: She appears well-developed. No distress.  Obese  HENT:  Head: Normocephalic.  Right Ear: External ear normal.  Left Ear: External ear normal.  Nose: Nose normal.  Mouth/Throat: Oropharynx is clear and moist.  Eyes: Conjunctivae are normal. Pupils are equal, round, and reactive to light. Right eye exhibits no discharge. Left eye exhibits no discharge.  Neck: Normal range of motion. Neck supple. No JVD present. No tracheal deviation present. No thyromegaly present.  Cardiovascular: Normal rate, regular rhythm and normal heart sounds.   Pulmonary/Chest: No stridor. No respiratory distress. She has no wheezes.  Abdominal: Soft. Bowel sounds are normal. She exhibits no distension and no mass. There is no tenderness. There is no rebound and no guarding.  Musculoskeletal: She exhibits no edema and no tenderness.  Lymphadenopathy:    She has no cervical adenopathy.  Neurological: She displays normal reflexes. No cranial nerve deficit. She exhibits normal muscle tone. Coordination normal.  Skin: No rash noted. No erythema.  Psychiatric: She has a normal mood and affect. Her behavior is normal. Judgment and thought content normal.   Lab Results  Component Value Date   WBC 9.5 01/18/2013   HGB 14.1 01/18/2013   HCT 41.8 01/18/2013   PLT 262.0 01/18/2013   GLUCOSE 94 01/18/2013   CHOL 175 01/29/2010   TRIG 97.0 01/29/2010   HDL 36.30* 01/29/2010   LDLCALC 119* 01/29/2010   ALT 24 01/10/2012   AST 19 01/10/2012   NA 140 01/18/2013   K 4.3 01/18/2013   CL 106 01/18/2013   CREATININE 0.6 01/18/2013   BUN  11 01/18/2013   CO2 28 01/18/2013   TSH 2.32 06/09/2012   INR 1.0 08/09/2007   HGBA1C 5.7 11/05/2007          Assessment & Plan:

## 2013-04-25 ENCOUNTER — Encounter: Payer: Self-pay | Admitting: Internal Medicine

## 2013-05-09 IMAGING — CR DG CHEST 2V
2 series · 2 of 2 positions shown · non-contrast
Comparison: Chest x-ray of 06/05/2010

CLINICAL DATA: Preop for bariatric screening, smoking history

CHEST - 2 VIEW

[view not recorded (1 of 2)]
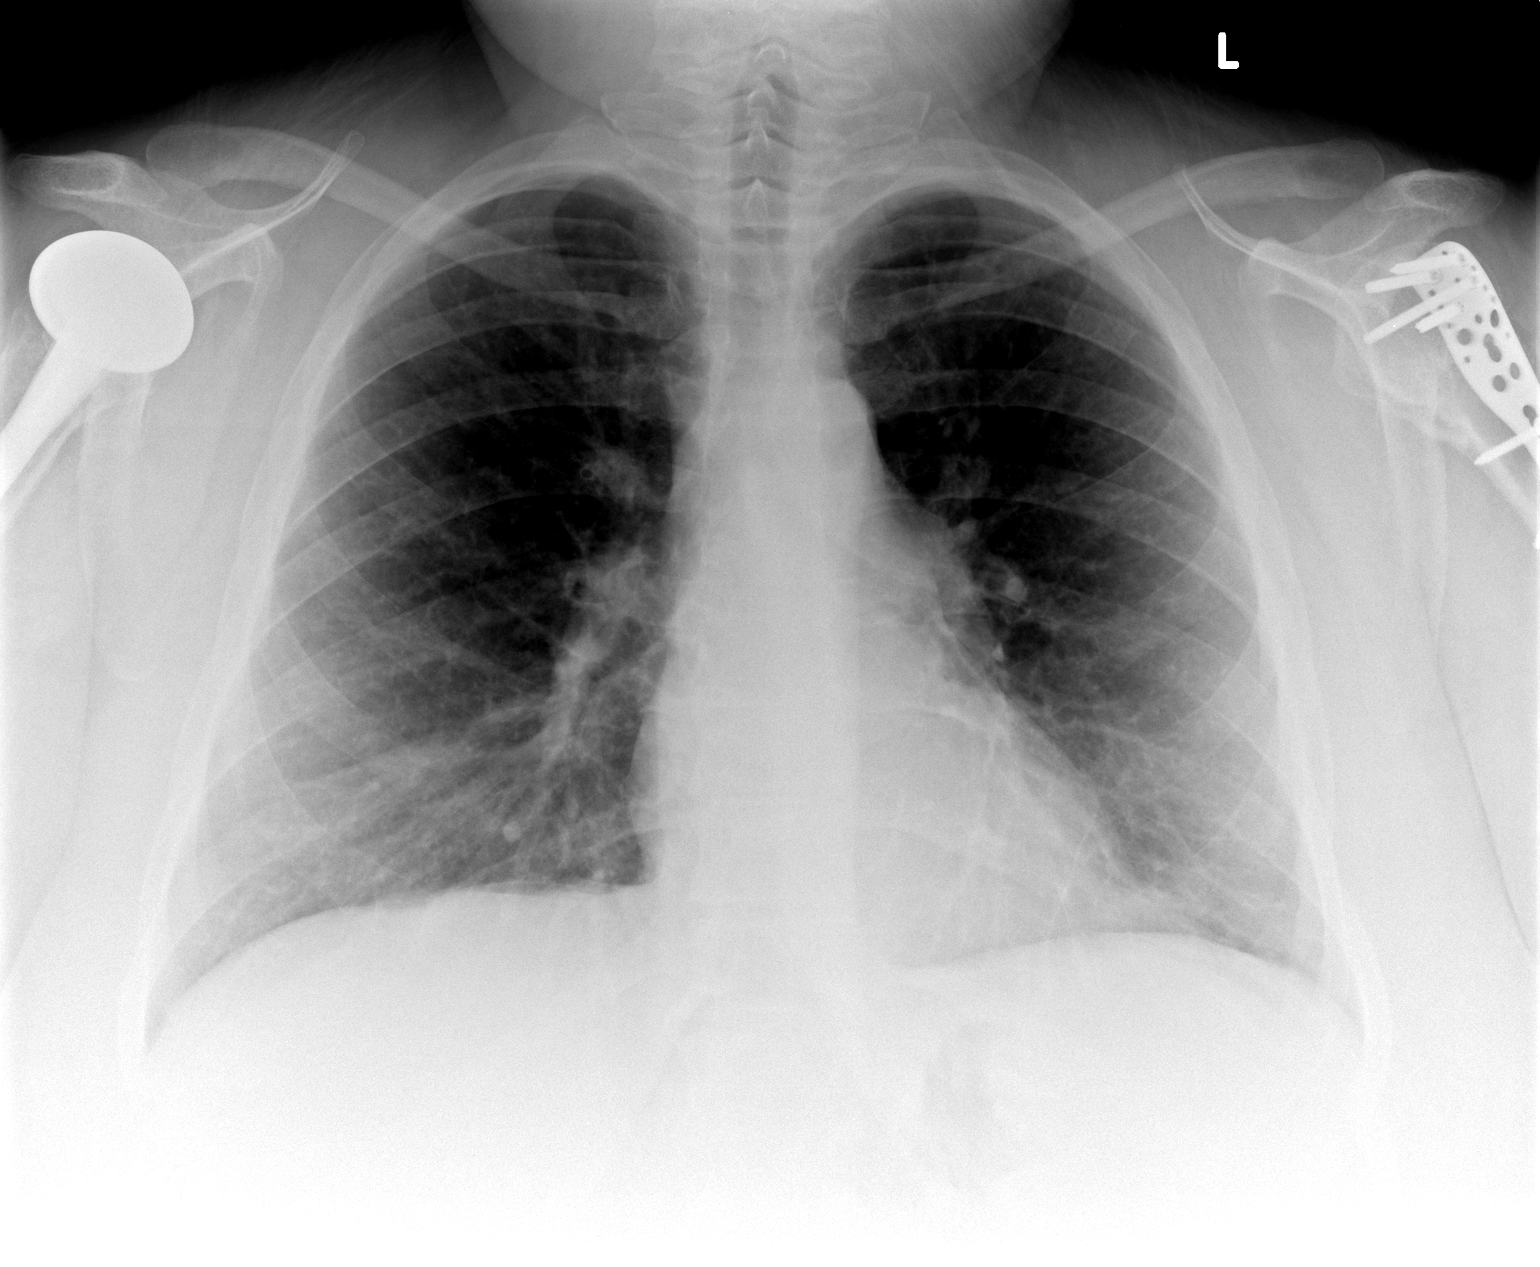

[view not recorded (2 of 2)]
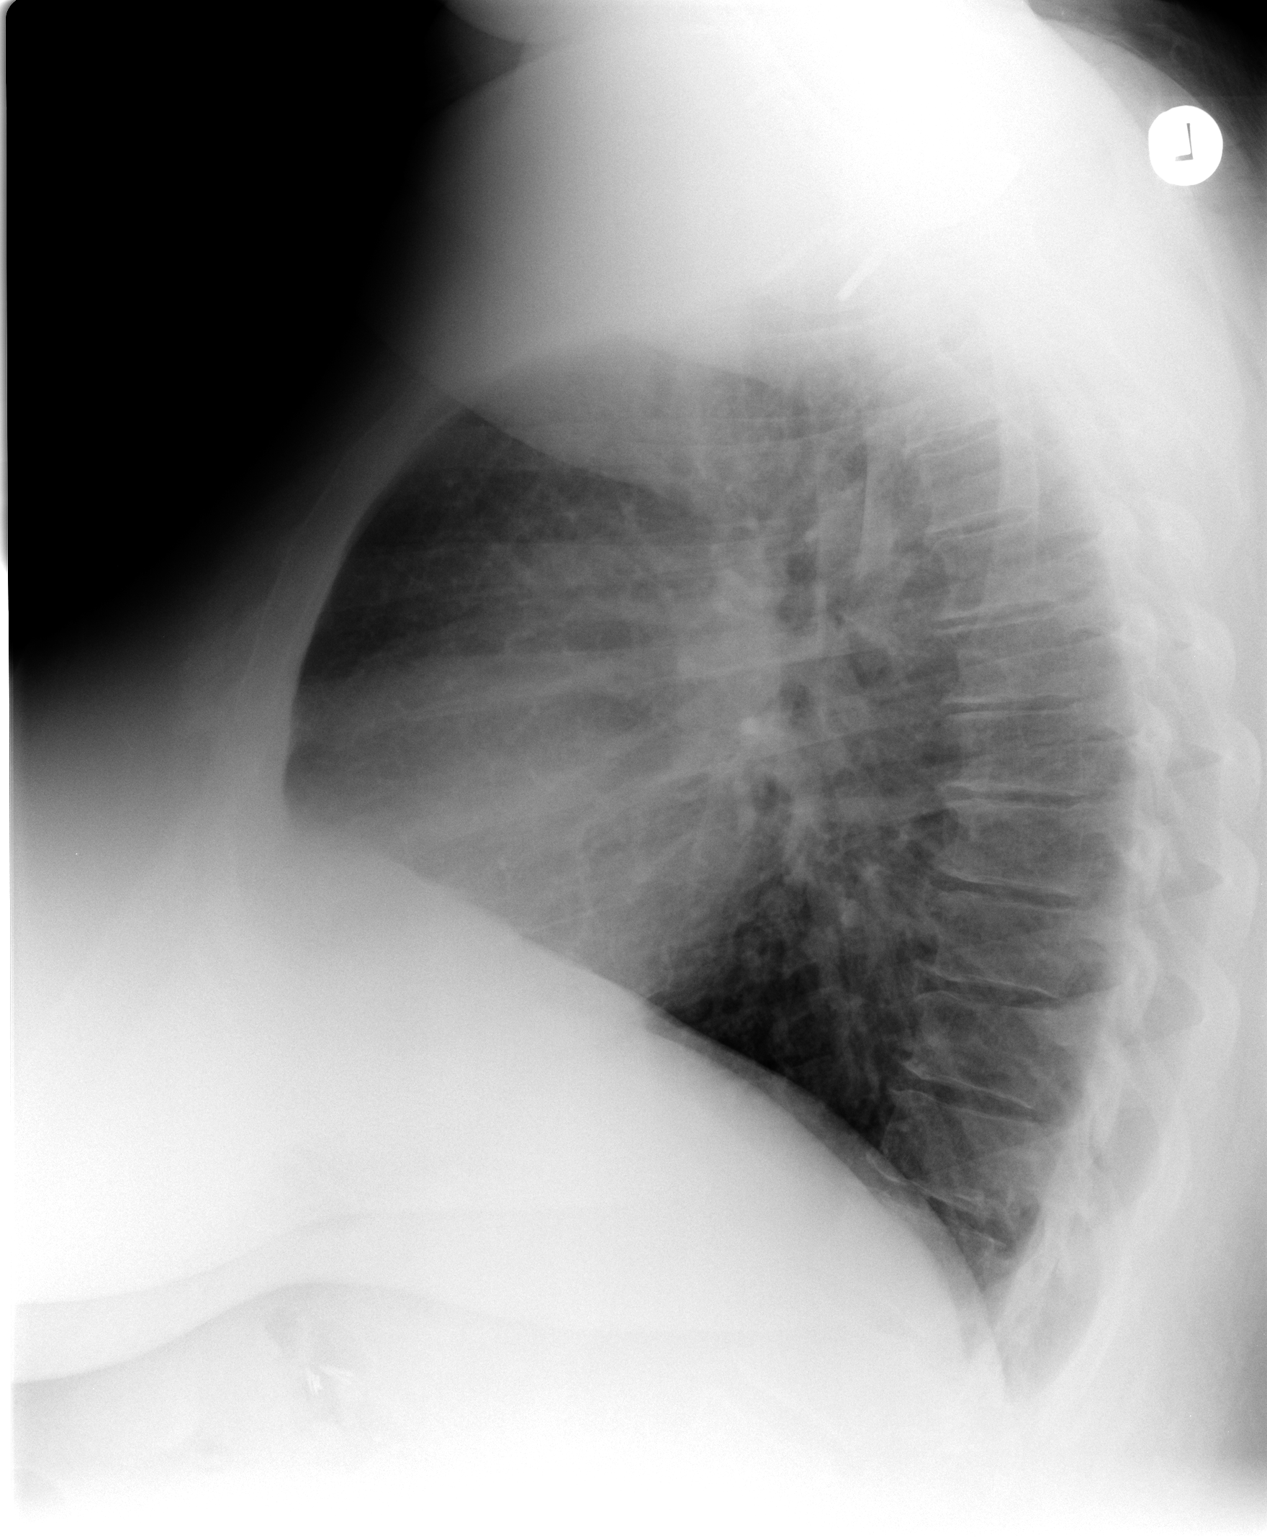

[2 of 2 positions shown; findings below may reference images not displayed]

FINDINGS: No active infiltrate or effusion is seen.  Mediastinal
contours are stable.  Peribronchial thickening is noted consistent
with bronchitis most likely chronic.  The heart is within normal
limits in size.  A right humeral head prosthesis is noted as well
as plate and screw fixation of prior left humeral neck fracture.
IMPRESSION: No active lung disease.  Probable chronic bronchitis.

## 2013-05-14 ENCOUNTER — Other Ambulatory Visit: Payer: Self-pay | Admitting: Internal Medicine

## 2013-05-15 IMAGING — RF DG UGI W/ KUB
14 of 24 series · 14 of 24 positions shown · non-contrast
Comparison: Acute abdomen series of 12/07/2010.

CLINICAL DATA: Preop for bariatric surgery.

UPPER GI SERIES WITH KUB
TECHNIQUE: Routine upper GI series was performed with thin barium
Fluoroscopy Time: 2.19 minutes

[Series 1: run · 1 of 1 slices shown (1 of 14)]
[im 1/1]
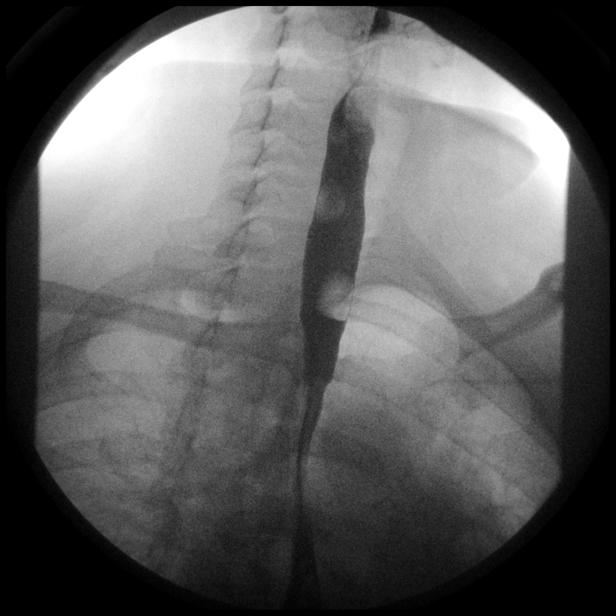

[Series 3: run · 1 of 1 slices shown (2 of 14)]
[im 1/1]
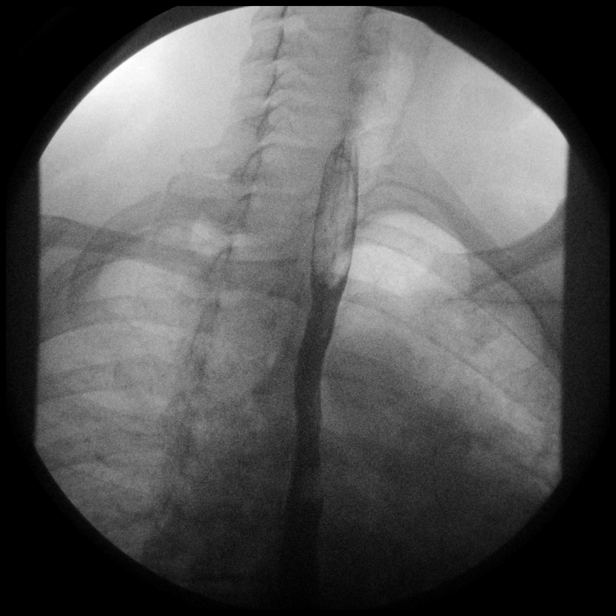

[Series 5: run · 1 of 1 slices shown (3 of 14)]
[im 1/1]
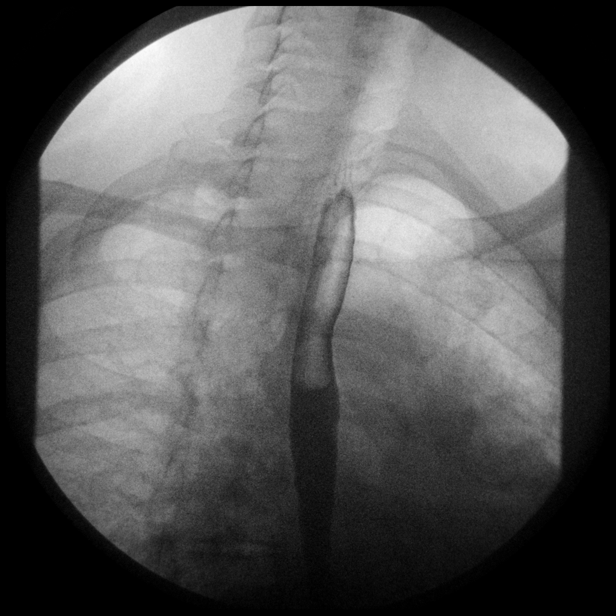

[Series 7: run · 1 of 1 slices shown (4 of 14)]
[im 1/1]
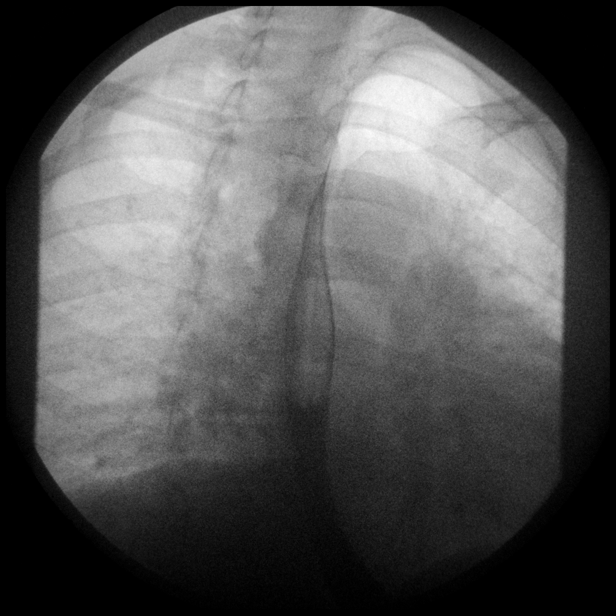

[Series 8: run · 1 of 1 slices shown (5 of 14)]
[im 1/1]
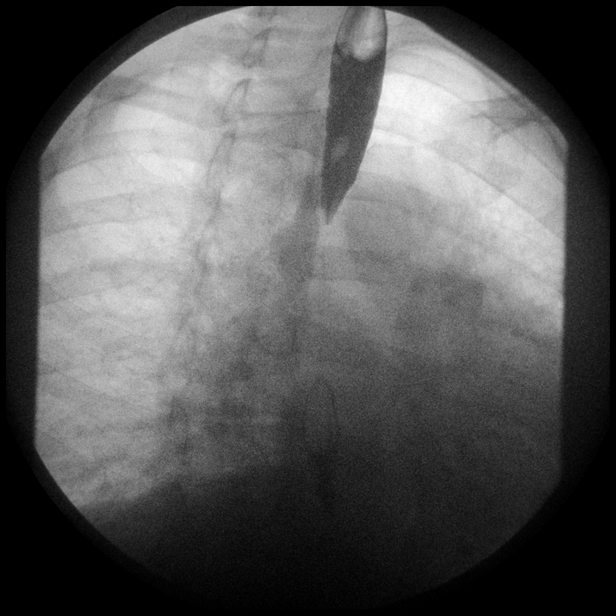

[Series 10: run · 1 of 1 slices shown (6 of 14)]
[im 1/1]
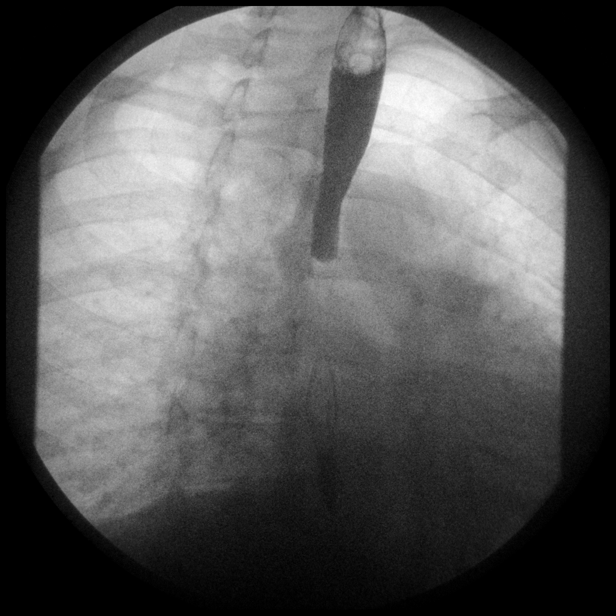

[Series 12: run · 1 of 1 slices shown (7 of 14)]
[im 1/1]
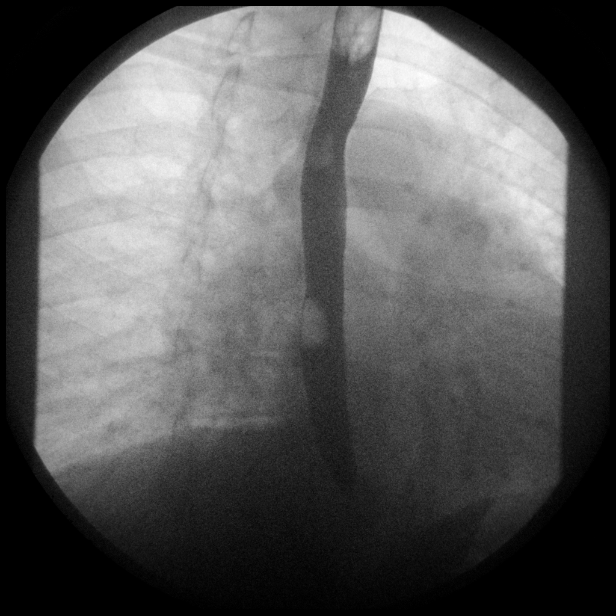

[Series 13: run · 1 of 1 slices shown (8 of 14)]
[im 1/1]
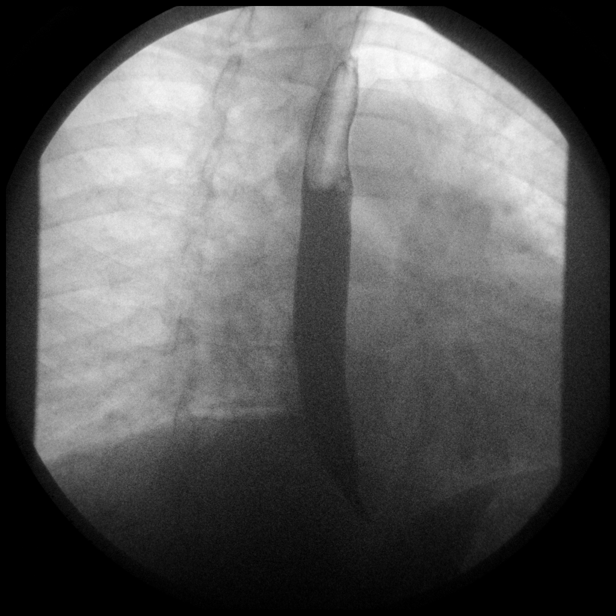

[Series 15: run · 1 of 1 slices shown (9 of 14)]
[im 1/1]
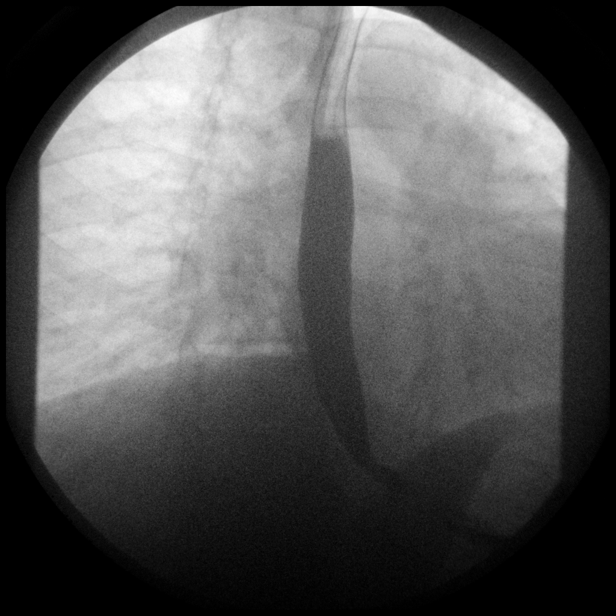

[Series 17: run · 1 of 1 slices shown (10 of 14)]
[im 1/1]
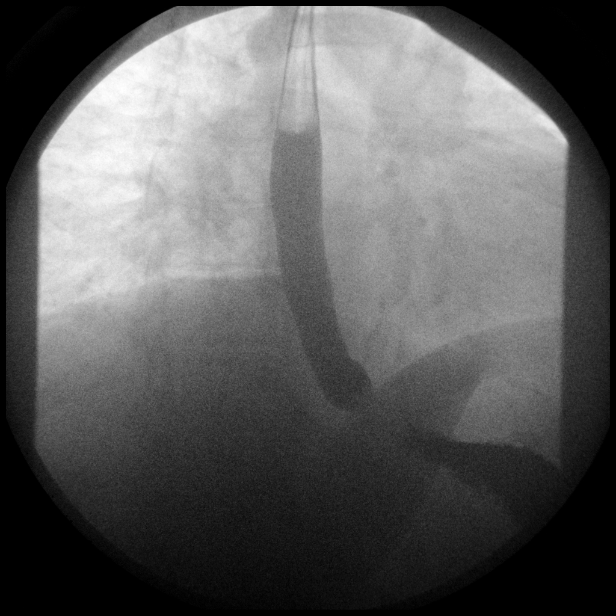

[Series 19: run · 1 of 1 slices shown (11 of 14)]
[im 1/1]
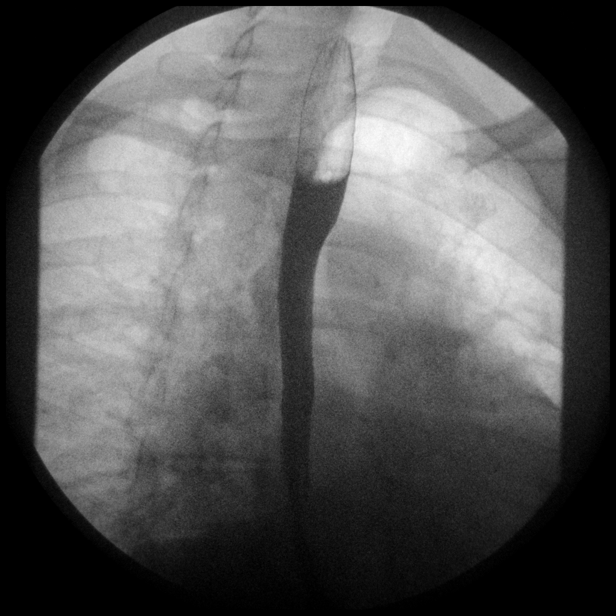

[Series 20: run · 1 of 1 slices shown (12 of 14)]
[im 1/1]
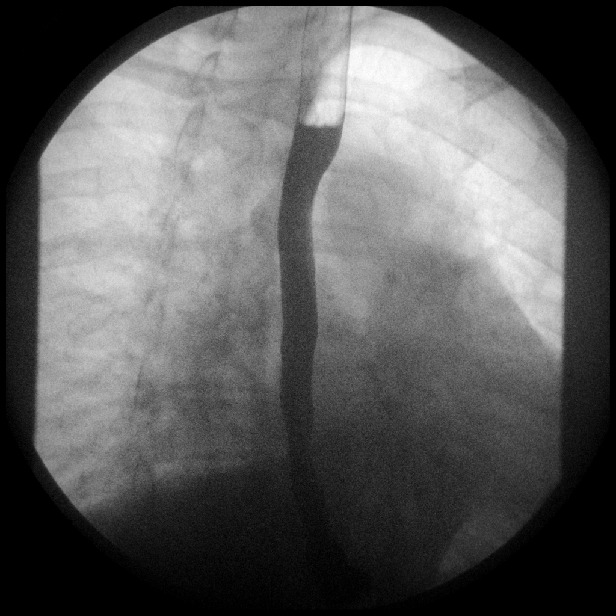

[Series 22: run · 1 of 1 slices shown (13 of 14)]
[im 1/1]
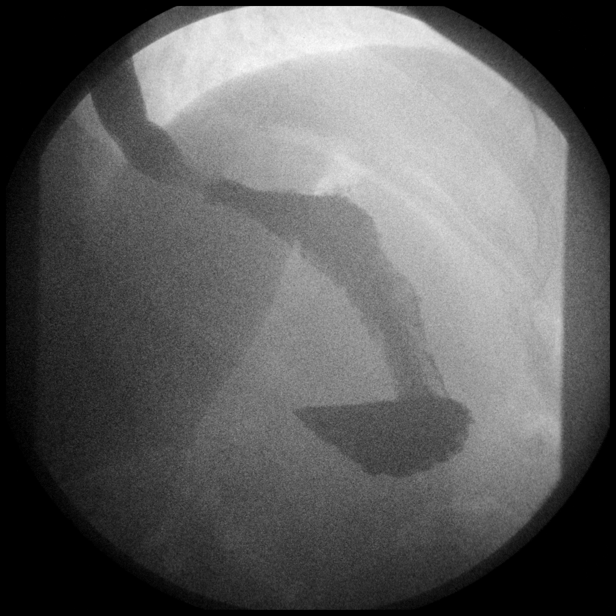

[Series 24: run · 1 of 1 slices shown (14 of 14)]
[im 1/1]
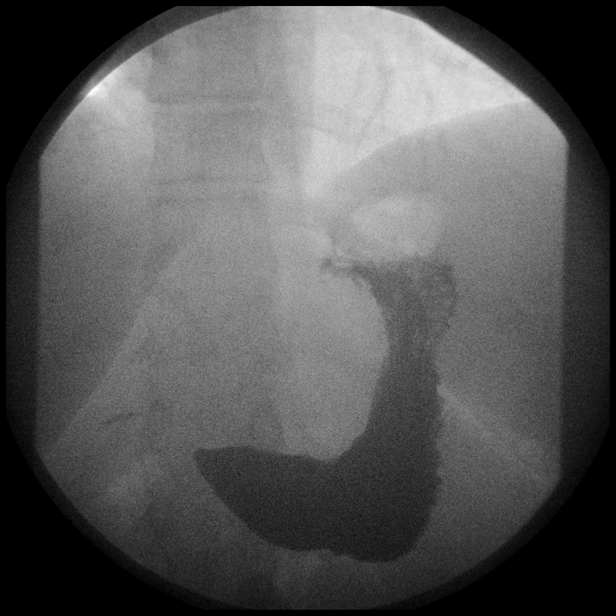

[14 of 24 positions shown; findings below may reference images not displayed]

FINDINGS: Exam is moderately degraded by patient body habitus and
immobility.  The the patient could not be placed on the fluoroscopy
table.  Therefore, images were performed with the patient standing
in varying obliquities.

Pre procedure scout film demonstrates a grossly normal bowel gas
pattern with cholecystectomy clips.

A single contrast evaluation of the esophagus demonstrates no focal
narrowing or stricture.  No hiatal hernia.

Stomach is normal in caliber and appearance.  Prompt passage of
contrast into the duodenal bulb and C-loop, which are grossly
normal.  There is mild nonspecific contrast stasis within the lower
thoracic esophagus, despite upright positioning.
IMPRESSION: 1.  Moderately degraded exam due to technical factors, as detailed
above.
2.  Normal esophageal and gastroduodenal anatomy.
3.  Contrast stasis in the lower thoracic esophagus suggests a
component of dysmotility.

## 2013-06-03 ENCOUNTER — Ambulatory Visit (INDEPENDENT_AMBULATORY_CARE_PROVIDER_SITE_OTHER): Payer: BC Managed Care – PPO | Admitting: Physician Assistant

## 2013-06-03 ENCOUNTER — Encounter (INDEPENDENT_AMBULATORY_CARE_PROVIDER_SITE_OTHER): Payer: Self-pay

## 2013-06-03 VITALS — BP 140/88 | HR 72 | Temp 98.6°F | Resp 14 | Ht 67.5 in | Wt 296.8 lb

## 2013-06-03 DIAGNOSIS — Z4651 Encounter for fitting and adjustment of gastric lap band: Secondary | ICD-10-CM

## 2013-06-03 NOTE — Patient Instructions (Signed)
Focus on good food choices as well as physical activity. Return sooner if you have an increase in hunger, portion sizes or weight. Return also for difficulty swallowing, night cough, reflux.

## 2013-06-03 NOTE — Progress Notes (Signed)
  HISTORY: Stephanie Harper is a 43 y.o.female who received an AP-Standard lap-band in January 2014 by Dr. Hassell Done. She comes in with 14 lbs weight loss since her last visit in early march. She reports increasing solid food dysphagia since then. She is not able to get healthy foods down. She has increased her activity as well as the weather has improved and her depression is under much better control. She wants some fluid removed to help her tolerate healthy foods.  VITAL SIGNS: Filed Vitals:   06/03/13 1107  BP: 140/88  Pulse: 72  Temp: 98.6 F (37 C)  Resp: 14    PHYSICAL EXAM: Physical exam reveals a very well-appearing 43 y.o.female in no apparent distress Neurologic: Awake, alert, oriented Psych: Bright affect, conversant Respiratory: Breathing even and unlabored. No stridor or wheezing Abdomen: Soft, nontender, nondistended to palpation. Incisions well-healed. No incisional hernias. Port easily palpated. Extremities: Atraumatic, good range of motion.  ASSESMENT: 43 y.o.  female  s/p AP-Standard lap-band.   PLAN: The patient's port was accessed with a 20G Huber needle without difficulty. Clear fluid was aspirated and 0.2 mL saline was removed from the port. She noticed water went down much easier than previously. I advised her to contact us if this isn't adequate. The patient was advised to concentrate on healthy food choices and to avoid slider foods high in fats and carbohydrates.

## 2013-06-10 ENCOUNTER — Ambulatory Visit (HOSPITAL_COMMUNITY): Payer: BC Managed Care – PPO | Admitting: Psychiatry

## 2013-06-17 ENCOUNTER — Ambulatory Visit (INDEPENDENT_AMBULATORY_CARE_PROVIDER_SITE_OTHER): Payer: BC Managed Care – PPO | Admitting: Psychiatry

## 2013-06-17 ENCOUNTER — Encounter (HOSPITAL_COMMUNITY): Payer: Self-pay | Admitting: Psychiatry

## 2013-06-17 DIAGNOSIS — F411 Generalized anxiety disorder: Secondary | ICD-10-CM | POA: Diagnosis not present

## 2013-06-17 DIAGNOSIS — F329 Major depressive disorder, single episode, unspecified: Secondary | ICD-10-CM | POA: Diagnosis not present

## 2013-06-17 DIAGNOSIS — F17200 Nicotine dependence, unspecified, uncomplicated: Secondary | ICD-10-CM

## 2013-06-17 MED ORDER — ALPRAZOLAM 1 MG PO TABS: 1.0000 mg | ORAL_TABLET | Freq: Two times a day (BID) | ORAL | Status: DC | PRN

## 2013-06-17 MED ORDER — BUPROPION HCL ER (SR) 100 MG PO TB12: 100.0000 mg | ORAL_TABLET | Freq: Two times a day (BID) | ORAL | Status: DC
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
## 2013-07-22 ENCOUNTER — Ambulatory Visit (INDEPENDENT_AMBULATORY_CARE_PROVIDER_SITE_OTHER): Payer: BC Managed Care – PPO | Admitting: Psychology

## 2013-07-22 DIAGNOSIS — F325 Major depressive disorder, single episode, in full remission: Secondary | ICD-10-CM | POA: Diagnosis not present

## 2013-07-22 DIAGNOSIS — F411 Generalized anxiety disorder: Secondary | ICD-10-CM
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
## 2013-07-23 ENCOUNTER — Encounter: Payer: Self-pay | Admitting: Internal Medicine

## 2013-07-23 ENCOUNTER — Ambulatory Visit (INDEPENDENT_AMBULATORY_CARE_PROVIDER_SITE_OTHER): Payer: BC Managed Care – PPO | Admitting: Internal Medicine

## 2013-07-23 DIAGNOSIS — F410 Panic disorder [episodic paroxysmal anxiety] without agoraphobia: Secondary | ICD-10-CM

## 2013-07-23 DIAGNOSIS — I10 Essential (primary) hypertension: Secondary | ICD-10-CM

## 2013-07-23 DIAGNOSIS — M5440 Lumbago with sciatica, unspecified side: Secondary | ICD-10-CM

## 2013-07-23 DIAGNOSIS — M25511 Pain in right shoulder: Secondary | ICD-10-CM

## 2013-07-23 DIAGNOSIS — M5430 Sciatica, unspecified side: Secondary | ICD-10-CM

## 2013-07-23 DIAGNOSIS — F3289 Other specified depressive episodes: Secondary | ICD-10-CM | POA: Diagnosis not present

## 2013-07-23 DIAGNOSIS — M2550 Pain in unspecified joint: Secondary | ICD-10-CM

## 2013-07-23 DIAGNOSIS — M25519 Pain in unspecified shoulder: Secondary | ICD-10-CM

## 2013-07-23 DIAGNOSIS — F329 Major depressive disorder, single episode, unspecified: Secondary | ICD-10-CM

## 2013-07-23 MED ORDER — HYDROCODONE-ACETAMINOPHEN 5-325 MG PO TABS: 1.0000 | ORAL_TABLET | Freq: Three times a day (TID) | ORAL | Status: DC | PRN

## 2013-07-23 NOTE — Progress Notes (Signed)
Pre visit review using our clinic review tool, if applicable. No additional management support is needed unless otherwise documented below in the visit note. 

## 2013-07-23 NOTE — Assessment & Plan Note (Signed)
Continue with current prescription therapy as reflected on the Med list.  

## 2013-07-23 NOTE — Assessment & Plan Note (Signed)
Stable

## 2013-07-23 NOTE — Progress Notes (Signed)
Subjective:     HPI        The patient is here to follow up on chronic R shoulder pain, depression, anxiety, headaches and chronic moderate fibromyalgia symptoms controlled with medicines, diet and exercise.  F/u bad arthralgias - overall better  F/u severe obesity -- s/p lap band  Wt Readings from Last 3 Encounters:  07/23/13 303 lb (137.44 kg)  06/03/13 296 lb 12.8 oz (134.628 kg)  04/23/13 301 lb (136.533 kg)   BP Readings from Last 3 Encounters:  07/23/13 110/82  06/03/13 140/88  04/23/13 118/76        Review of Systems  Constitutional: Negative.  Negative for fever, chills, diaphoresis, activity change, appetite change, fatigue and unexpected weight change.  HENT: Negative for congestion, ear pain, facial swelling, hearing loss, mouth sores, nosebleeds, postnasal drip, rhinorrhea, sinus pressure, sneezing, sore throat, tinnitus and trouble swallowing.   Eyes: Negative for pain, discharge, redness, itching and visual disturbance.  Respiratory: Negative for cough, chest tightness, shortness of breath, wheezing and stridor.   Cardiovascular: Negative for chest pain, palpitations and leg swelling.  Gastrointestinal: Negative for nausea, abdominal pain, diarrhea, constipation, blood in stool, abdominal distention, anal bleeding and rectal pain.  Genitourinary: Negative for dysuria, urgency, frequency, hematuria, flank pain, vaginal bleeding, vaginal discharge, difficulty urinating, genital sores and pelvic pain.  Musculoskeletal: Positive for back pain. Negative for arthralgias, gait problem, joint swelling, neck pain and neck stiffness.  Skin: Negative.  Negative for rash.  Neurological: Negative for dizziness, tremors, seizures, syncope, speech difficulty, weakness, numbness and headaches.  Hematological: Negative for adenopathy. Does not bruise/bleed easily.  Psychiatric/Behavioral: Negative for suicidal ideas, behavioral problems, sleep disturbance, dysphoric mood  and decreased concentration. The patient is not nervous/anxious.            Objective:   Physical Exam  Constitutional: She appears well-developed. No distress.  Obese  HENT:  Head: Normocephalic.  Right Ear: External ear normal.  Left Ear: External ear normal.  Nose: Nose normal.  Mouth/Throat: Oropharynx is clear and moist.  Eyes: Conjunctivae are normal. Pupils are equal, round, and reactive to light. Right eye exhibits no discharge. Left eye exhibits no discharge.  Neck: Normal range of motion. Neck supple. No JVD present. No tracheal deviation present. No thyromegaly present.  Cardiovascular: Normal rate, regular rhythm and normal heart sounds.   Pulmonary/Chest: No stridor. No respiratory distress. She has no wheezes.  Abdominal: Soft. Bowel sounds are normal. She exhibits no distension and no mass. There is no tenderness. There is no rebound and no guarding.  Musculoskeletal: She exhibits no edema and no tenderness.  Lymphadenopathy:    She has no cervical adenopathy.  Neurological: She displays normal reflexes. No cranial nerve deficit. She exhibits normal muscle tone. Coordination normal.  Skin: No rash noted. No erythema.  Psychiatric: She has a normal mood and affect. Her behavior is normal. Judgment and thought content normal.   Lab Results  Component Value Date   WBC 9.5 01/18/2013   HGB 14.1 01/18/2013   HCT 41.8 01/18/2013   PLT 262.0 01/18/2013   GLUCOSE 94 01/18/2013   CHOL 175 01/29/2010   TRIG 97.0 01/29/2010   HDL 36.30* 01/29/2010   LDLCALC 119* 01/29/2010   ALT 24 01/10/2012   AST 19 01/10/2012   NA 140 01/18/2013   K 4.3 01/18/2013   CL 106 01/18/2013   CREATININE 0.6 01/18/2013   BUN 11 01/18/2013   CO2 28 01/18/2013   TSH 2.32 06/09/2012  INR 1.0 08/09/2007   HGBA1C 5.7 11/05/2007          Assessment & Plan:

## 2013-07-24 ENCOUNTER — Telehealth: Payer: Self-pay | Admitting: Internal Medicine

## 2013-07-24 NOTE — Telephone Encounter (Signed)
Relevant patient education assigned to patient using Emmi. ° °

## 2013-09-17 ENCOUNTER — Other Ambulatory Visit: Payer: Self-pay | Admitting: Internal Medicine

## 2013-09-21 ENCOUNTER — Ambulatory Visit (HOSPITAL_COMMUNITY): Payer: Self-pay | Admitting: Psychiatry

## 2013-09-21 NOTE — Telephone Encounter (Signed)
Pt left msg on triage stating pharmacy sent request Friday on her alprazolam & they haven't received call bck. Pt states she is needing her meds. Pls advise on refill...Stephanie Harper

## 2013-09-22 NOTE — Telephone Encounter (Signed)
[facility]ed [facility] spoke with [name] gave md approval, also notified pt rx call into pharmacy...Stephanie Harper

## 2013-10-22 ENCOUNTER — Ambulatory Visit (INDEPENDENT_AMBULATORY_CARE_PROVIDER_SITE_OTHER): Payer: BC Managed Care – PPO | Admitting: Internal Medicine

## 2013-10-22 ENCOUNTER — Encounter: Payer: Self-pay | Admitting: Internal Medicine

## 2013-10-22 VITALS — BP 112/80 | HR 76 | Temp 98.8°F | Resp 16 | Wt 301.0 lb

## 2013-10-22 DIAGNOSIS — Z23 Encounter for immunization: Secondary | ICD-10-CM

## 2013-10-22 DIAGNOSIS — F411 Generalized anxiety disorder: Secondary | ICD-10-CM

## 2013-10-22 DIAGNOSIS — J4520 Mild intermittent asthma, uncomplicated: Secondary | ICD-10-CM | POA: Diagnosis not present

## 2013-10-22 DIAGNOSIS — I10 Essential (primary) hypertension: Secondary | ICD-10-CM | POA: Diagnosis not present

## 2013-10-22 DIAGNOSIS — M5440 Lumbago with sciatica, unspecified side: Secondary | ICD-10-CM

## 2013-10-22 DIAGNOSIS — F32A Depression, unspecified: Secondary | ICD-10-CM

## 2013-10-22 DIAGNOSIS — F329 Major depressive disorder, single episode, unspecified: Secondary | ICD-10-CM | POA: Diagnosis not present

## 2013-10-22 MED ORDER — HYDROCODONE-ACETAMINOPHEN 5-325 MG PO TABS: 1.0000 | ORAL_TABLET | Freq: Three times a day (TID) | ORAL | Status: DC | PRN

## 2013-10-22 MED ORDER — PAROXETINE HCL 10 MG PO TABS: 10.0000 mg | ORAL_TABLET | Freq: Every day | ORAL | Status: DC

## 2013-10-22 MED ORDER — ALPRAZOLAM 1 MG PO TABS: ORAL_TABLET | ORAL | Status: DC

## 2013-10-22 NOTE — Progress Notes (Signed)
Subjective:     HPI  The patient is here to follow up on chronic R shoulder pain, depression, anxiety, headaches and chronic moderate fibromyalgia symptoms controlled with medicines, diet and exercise. Bought a house in [place] 10/15. Stressed.  F/u bad arthralgias - overall better  F/u severe obesity -- s/p lap band  Wt Readings from Last 3 Encounters:  10/22/13 301 lb (136.533 kg)  07/23/13 303 lb (137.44 kg)  06/03/13 296 lb 12.8 oz (134.628 kg)   BP Readings from Last 3 Encounters:  10/22/13 112/80  07/23/13 110/82  06/03/13 140/88        Review of Systems  Constitutional: Negative.  Negative for fever, chills, diaphoresis, activity change, appetite change, fatigue and unexpected weight change.  HENT: Negative for congestion, ear pain, facial swelling, hearing loss, mouth sores, nosebleeds, postnasal drip, rhinorrhea, sinus pressure, sneezing, sore throat, tinnitus and trouble swallowing.   Eyes: Negative for pain, discharge, redness, itching and visual disturbance.  Respiratory: Negative for cough, chest tightness, shortness of breath, wheezing and stridor.   Cardiovascular: Negative for chest pain, palpitations and leg swelling.  Gastrointestinal: Negative for nausea, abdominal pain, diarrhea, constipation, blood in stool, abdominal distention, anal bleeding and rectal pain.  Genitourinary: Negative for dysuria, urgency, frequency, hematuria, flank pain, vaginal bleeding, vaginal discharge, difficulty urinating, genital sores and pelvic pain.  Musculoskeletal: Positive for back pain. Negative for arthralgias, gait problem, joint swelling, neck pain and neck stiffness.  Skin: Negative.  Negative for rash.  Neurological: Negative for dizziness, tremors, seizures, syncope, speech difficulty, weakness, numbness and headaches.  Hematological: Negative for adenopathy. Does not bruise/bleed easily.  Psychiatric/Behavioral: Negative for suicidal ideas, behavioral problems, sleep  disturbance, dysphoric mood and decreased concentration. The patient is not nervous/anxious.            Objective:   Physical Exam  Constitutional: She appears well-developed. No distress.  Obese  HENT:  Head: Normocephalic.  Right Ear: External ear normal.  Left Ear: External ear normal.  Nose: Nose normal.  Mouth/Throat: Oropharynx is clear and moist.  Eyes: Conjunctivae are normal. Pupils are equal, round, and reactive to light. Right eye exhibits no discharge. Left eye exhibits no discharge.  Neck: Normal range of motion. Neck supple. No JVD present. No tracheal deviation present. No thyromegaly present.  Cardiovascular: Normal rate, regular rhythm and normal heart sounds.   Pulmonary/Chest: No stridor. No respiratory distress. She has no wheezes.  Abdominal: Soft. Bowel sounds are normal. She exhibits no distension and no mass. There is no tenderness. There is no rebound and no guarding.  Musculoskeletal: She exhibits no edema and no tenderness.  Lymphadenopathy:    She has no cervical adenopathy.  Neurological: She displays normal reflexes. No cranial nerve deficit. She exhibits normal muscle tone. Coordination normal.  Skin: No rash noted. No erythema.  Psychiatric: She has a normal mood and affect. Her behavior is normal. Judgment and thought content normal.   Lab Results  Component Value Date   WBC 9.5 01/18/2013   HGB 14.1 01/18/2013   HCT 41.8 01/18/2013   PLT 262.0 01/18/2013   GLUCOSE 94 01/18/2013   CHOL 175 01/29/2010   TRIG 97.0 01/29/2010   HDL 36.30* 01/29/2010   LDLCALC 119* 01/29/2010   ALT 24 01/10/2012   AST 19 01/10/2012   NA 140 01/18/2013   K 4.3 01/18/2013   CL 106 01/18/2013   CREATININE 0.6 01/18/2013   BUN 11 01/18/2013   CO2 28 01/18/2013   TSH 2.32 06/09/2012  INR 1.0 08/09/2007   HGBA1C 5.7 11/05/2007          Assessment & Plan:

## 2013-10-22 NOTE — Assessment & Plan Note (Signed)
Continue with current prescription therapy as reflected on the Med list.  

## 2013-10-22 NOTE — Progress Notes (Signed)
Pre visit review using our clinic review tool, if applicable. No additional management support is needed unless otherwise documented below in the visit note. 

## 2013-10-22 NOTE — Assessment & Plan Note (Signed)
Re-started Paxil Stressed w/a new house

## 2013-10-22 NOTE — Assessment & Plan Note (Signed)
Wt Readings from Last 3 Encounters:  10/22/13 301 lb (136.533 kg)  07/23/13 303 lb (137.44 kg)  06/03/13 296 lb 12.8 oz (134.628 kg)

## 2013-10-25 ENCOUNTER — Telehealth: Payer: Self-pay | Admitting: Internal Medicine

## 2013-10-25 NOTE — Telephone Encounter (Signed)
emmi emailed °

## 2013-10-27 ENCOUNTER — Encounter (HOSPITAL_COMMUNITY): Payer: Self-pay | Admitting: Psychology

## 2013-10-27 DIAGNOSIS — F411 Generalized anxiety disorder: Secondary | ICD-10-CM

## 2013-10-27 DIAGNOSIS — F325 Major depressive disorder, single episode, in full remission: Secondary | ICD-10-CM

## 2013-10-27 NOTE — Progress Notes (Signed)
Stephanie Harper is a 43 y.o. female patient discharged from services as pt is not seeking counseling services.  Outpatient Therapist Discharge Summary  NAILEAH KARG    December 29, 1970   Admission Date: 07/22/13   Discharge Date:  09/21/13 Reason for Discharge:  Not seeking services- reports symptoms in remission Diagnosis:  Major depressive disorder, single episode, in full remission  GAD (generalized anxiety disorder)   Comments:  Pt attended admission assessment as recommended.  Pt reports symptoms in remission and not seeking counseling- utilizing natural supports and f/u w/ PCP for medication management.   Delle Reining, LPC

## 2013-10-31 ENCOUNTER — Encounter: Payer: Self-pay | Admitting: Internal Medicine

## 2013-11-17 ENCOUNTER — Other Ambulatory Visit: Payer: Self-pay | Admitting: Internal Medicine

## 2013-11-30 DIAGNOSIS — K9509 Other complications of gastric band procedure: Secondary | ICD-10-CM | POA: Diagnosis not present

## 2013-12-05 IMAGING — CR DG ABDOMEN 1V
2 series · 2 of 2 positions shown · non-contrast
Comparison: 12/07/2010

CLINICAL DATA: postop from the lap band placement.  Abdominal
soreness.

ABDOMEN - 1 VIEW

[t abdomen supine (1 of 2)]
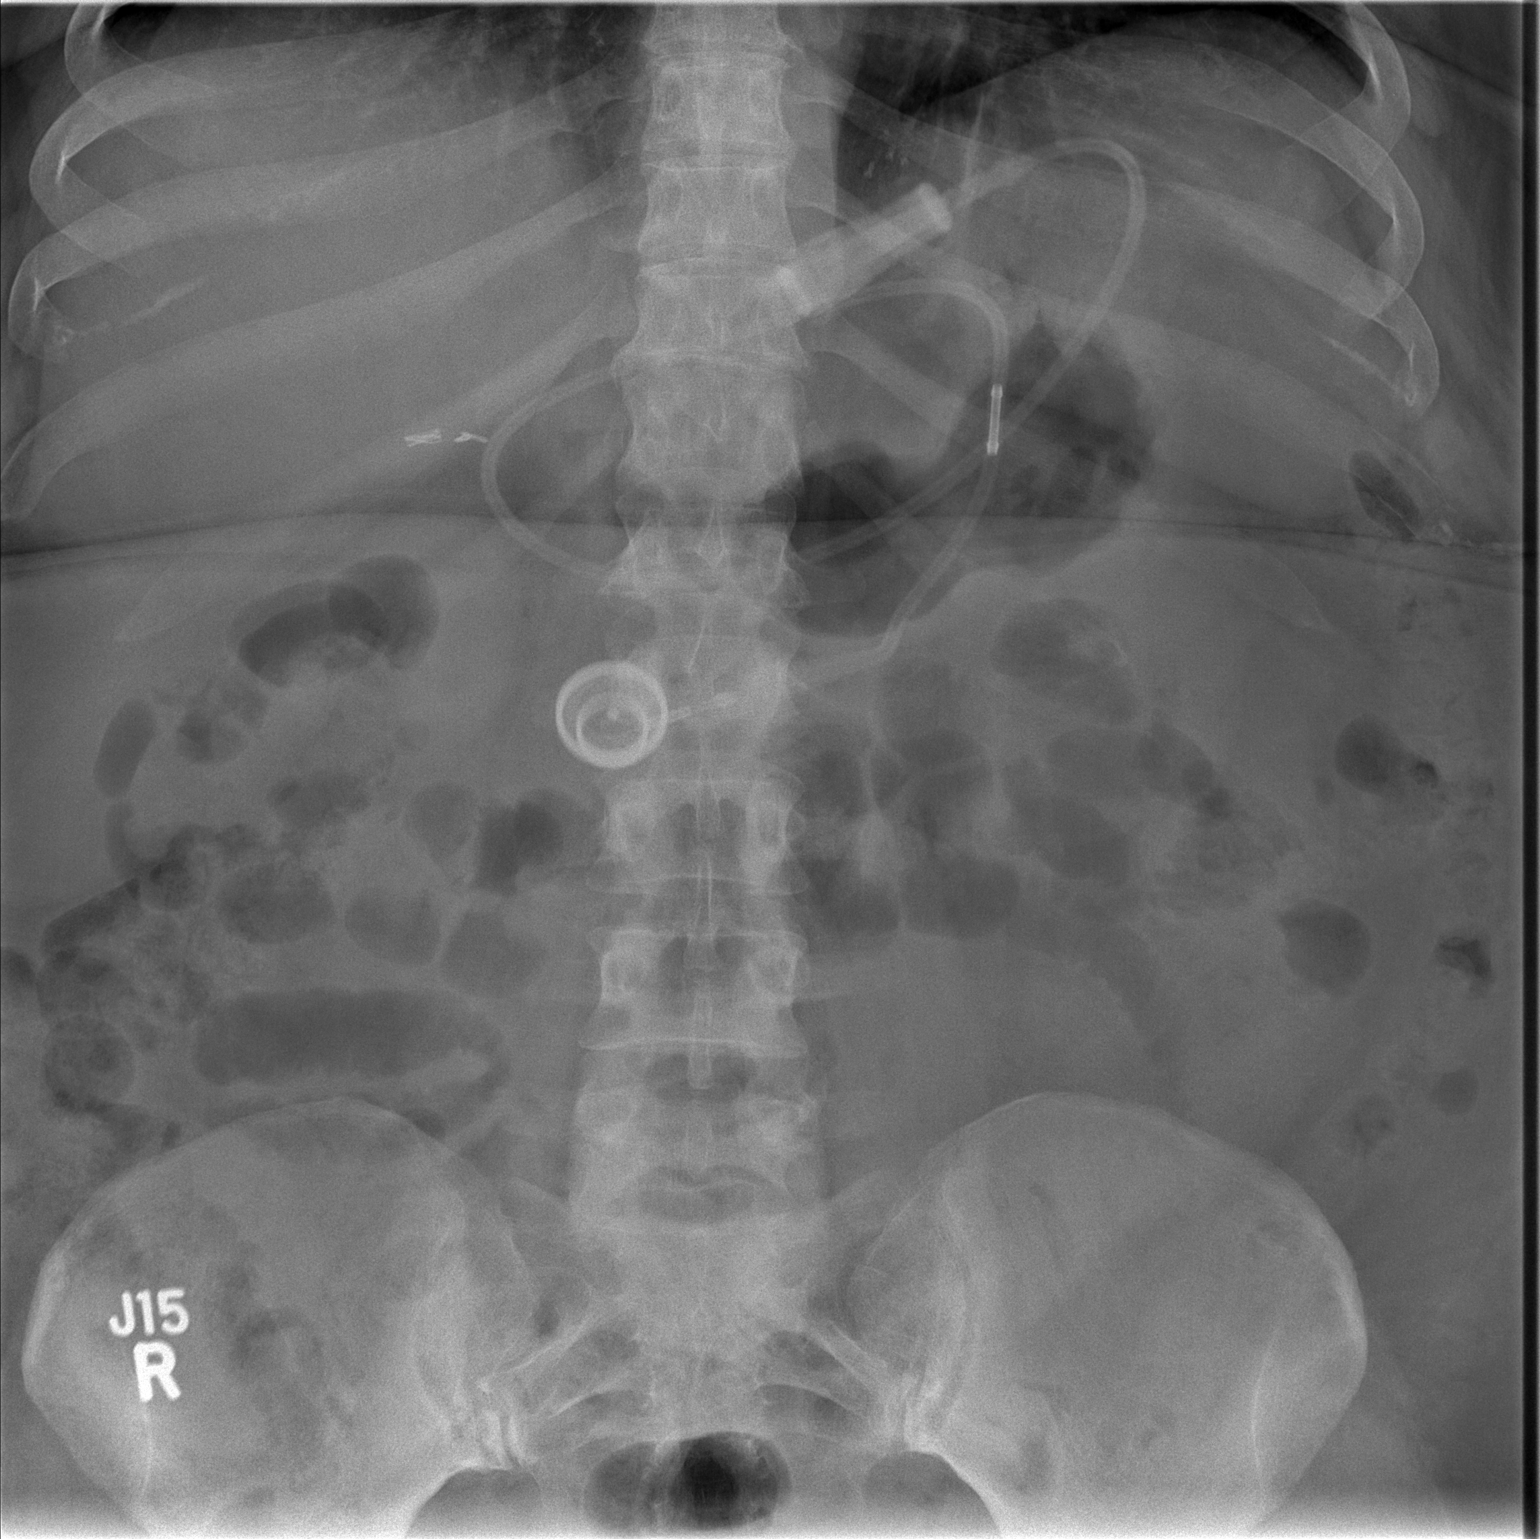

[t abdomen supine (2 of 2)]
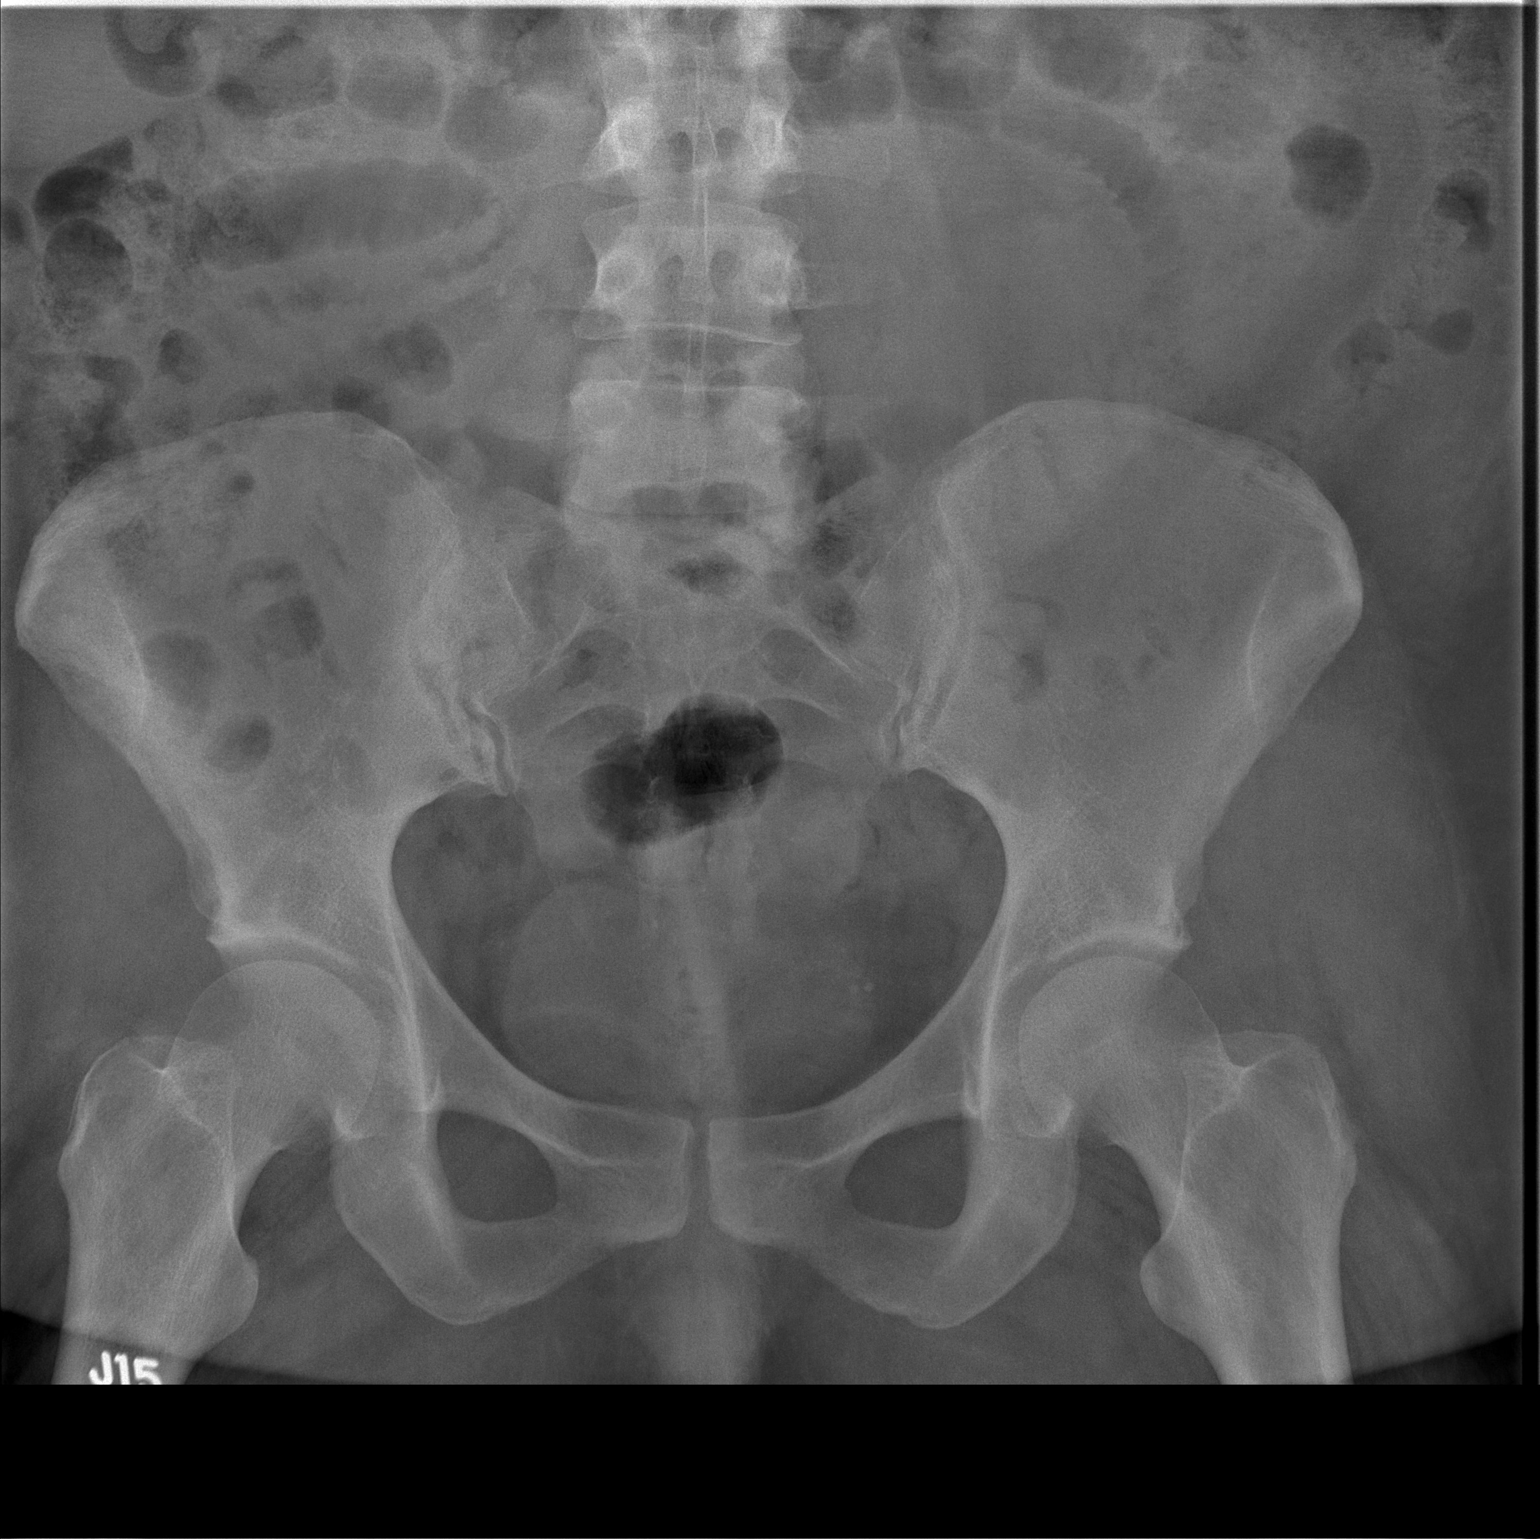

[2 of 2 positions shown; findings below may reference images not displayed]

FINDINGS: The comparison study was before lap band placement, so
assessment for positional change is not possible.  The band is
oriented 8 o'clock to 2 o'clock position.
There is no gaseous bowel dilatation to suggest obstruction or
ileus.
IMPRESSION: Lap band appears appropriately positioned.  No evidence for bowel
dilatation.

## 2014-01-25 ENCOUNTER — Telehealth: Payer: Self-pay | Admitting: Internal Medicine

## 2014-01-25 ENCOUNTER — Other Ambulatory Visit: Payer: Self-pay | Admitting: *Deleted

## 2014-01-25 ENCOUNTER — Encounter: Payer: Self-pay | Admitting: Internal Medicine

## 2014-01-25 ENCOUNTER — Ambulatory Visit (INDEPENDENT_AMBULATORY_CARE_PROVIDER_SITE_OTHER): Payer: BLUE CROSS/BLUE SHIELD | Admitting: Internal Medicine

## 2014-01-25 VITALS — BP 122/74 | HR 79 | Temp 98.3°F | Wt 318.0 lb

## 2014-01-25 DIAGNOSIS — F411 Generalized anxiety disorder: Secondary | ICD-10-CM

## 2014-01-25 DIAGNOSIS — R21 Rash and other nonspecific skin eruption: Secondary | ICD-10-CM

## 2014-01-25 DIAGNOSIS — M5440 Lumbago with sciatica, unspecified side: Secondary | ICD-10-CM | POA: Diagnosis not present

## 2014-01-25 MED ORDER — HYDROCODONE-ACETAMINOPHEN 5-325 MG PO TABS: 1.0000 | ORAL_TABLET | Freq: Four times a day (QID) | ORAL | Status: DC | PRN

## 2014-01-25 MED ORDER — ALPRAZOLAM 1 MG PO TABS: ORAL_TABLET | ORAL | Status: DC

## 2014-01-25 MED ORDER — ALBUTEROL SULFATE HFA 108 (90 BASE) MCG/ACT IN AERS: 2.0000 | INHALATION_SPRAY | Freq: Four times a day (QID) | RESPIRATORY_TRACT | Status: DC | PRN

## 2014-01-25 MED ORDER — TRIAMCINOLONE ACETONIDE 0.5 % EX CREA: 1.0000 "application " | TOPICAL_CREAM | Freq: Three times a day (TID) | CUTANEOUS | Status: DC

## 2014-01-25 NOTE — Telephone Encounter (Signed)
Pt called stated that she was just here for an appt and Dr. Camila Li gave her hydrocodone for Feb and Mar but not for Jan. Please help, she need the one for this month.

## 2014-01-25 NOTE — Progress Notes (Signed)
Subjective:     HPI  The patient is here to follow up on chronic R shoulder pain, depression, anxiety, headaches and chronic moderate fibromyalgia symptoms controlled with medicines, diet and exercise. PT Bought a house in [place] 10/15. Stressed less  F/u bad arthralgias - worse  F/u severe obesity -- s/p lap band  Wt Readings from Last 3 Encounters:  01/25/14 318 lb (144.244 kg)  10/22/13 301 lb (136.533 kg)  07/23/13 303 lb (137.44 kg)   BP Readings from Last 3 Encounters:  01/25/14 122/74  10/22/13 112/80  07/23/13 110/82        Review of Systems  Constitutional: Negative.  Negative for fever, chills, diaphoresis, activity change, appetite change, fatigue and unexpected weight change.  HENT: Negative for congestion, ear pain, facial swelling, hearing loss, mouth sores, nosebleeds, postnasal drip, rhinorrhea, sinus pressure, sneezing, sore throat, tinnitus and trouble swallowing.   Eyes: Negative for pain, discharge, redness, itching and visual disturbance.  Respiratory: Negative for cough, chest tightness, shortness of breath, wheezing and stridor.   Cardiovascular: Negative for chest pain, palpitations and leg swelling.  Gastrointestinal: Negative for nausea, abdominal pain, diarrhea, constipation, blood in stool, abdominal distention, anal bleeding and rectal pain.  Genitourinary: Negative for dysuria, urgency, frequency, hematuria, flank pain, vaginal bleeding, vaginal discharge, difficulty urinating, genital sores and pelvic pain.  Musculoskeletal: Positive for back pain. Negative for arthralgias, gait problem, joint swelling, neck pain and neck stiffness.  Skin: Negative.  Negative for rash.  Neurological: Negative for dizziness, tremors, seizures, syncope, speech difficulty, weakness, numbness and headaches.  Hematological: Negative for adenopathy. Does not bruise/bleed easily.  Psychiatric/Behavioral: Negative for suicidal ideas, behavioral problems, sleep  disturbance, dysphoric mood and decreased concentration. The patient is not nervous/anxious.            Objective:   Physical Exam  Constitutional: She appears well-developed. No distress.  Obese  HENT:  Head: Normocephalic.  Right Ear: External ear normal.  Left Ear: External ear normal.  Nose: Nose normal.  Mouth/Throat: Oropharynx is clear and moist.  Eyes: Conjunctivae are normal. Pupils are equal, round, and reactive to light. Right eye exhibits no discharge. Left eye exhibits no discharge.  Neck: Normal range of motion. Neck supple. No JVD present. No tracheal deviation present. No thyromegaly present.  Cardiovascular: Normal rate, regular rhythm and normal heart sounds.   Pulmonary/Chest: No stridor. No respiratory distress. She has no wheezes.  Abdominal: Soft. Bowel sounds are normal. She exhibits no distension and no mass. There is no tenderness. There is no rebound and no guarding.  Musculoskeletal: She exhibits no edema and no tenderness.  Lymphadenopathy:    She has no cervical adenopathy.  Neurological: She displays normal reflexes. No cranial nerve deficit. She exhibits normal muscle tone. Coordination normal.  Skin: No rash noted. No erythema.  Psychiatric: She has a normal mood and affect. Her behavior is normal. Judgment and thought content normal.   Lab Results  Component Value Date   WBC 9.5 01/18/2013   HGB 14.1 01/18/2013   HCT 41.8 01/18/2013   PLT 262.0 01/18/2013   GLUCOSE 94 01/18/2013   CHOL 175 01/29/2010   TRIG 97.0 01/29/2010   HDL 36.30* 01/29/2010   LDLCALC 119* 01/29/2010   ALT 24 01/10/2012   AST 19 01/10/2012   NA 140 01/18/2013   K 4.3 01/18/2013   CL 106 01/18/2013   CREATININE 0.6 01/18/2013   BUN 11 01/18/2013   CO2 28 01/18/2013   TSH 2.32 06/09/2012  INR 1.0 08/09/2007   HGBA1C 5.7 11/05/2007          Assessment & Plan:

## 2014-01-25 NOTE — Assessment & Plan Note (Signed)
Triamc cream prn

## 2014-01-25 NOTE — Assessment & Plan Note (Signed)
Continue with current prescription therapy as reflected on the Med list.  

## 2014-01-25 NOTE — Assessment & Plan Note (Signed)
Wt Readings from Last 3 Encounters:  01/25/14 318 lb (144.244 kg)  10/22/13 301 lb (136.533 kg)  07/23/13 303 lb (137.44 kg)    F/u w/Dr Hassell Done

## 2014-01-26 ENCOUNTER — Other Ambulatory Visit (INDEPENDENT_AMBULATORY_CARE_PROVIDER_SITE_OTHER): Payer: BLUE CROSS/BLUE SHIELD

## 2014-01-26 ENCOUNTER — Telehealth: Payer: Self-pay | Admitting: Internal Medicine

## 2014-01-26 DIAGNOSIS — M5440 Lumbago with sciatica, unspecified side: Secondary | ICD-10-CM

## 2014-01-26 DIAGNOSIS — R21 Rash and other nonspecific skin eruption: Secondary | ICD-10-CM | POA: Diagnosis not present

## 2014-01-26 DIAGNOSIS — F411 Generalized anxiety disorder: Secondary | ICD-10-CM

## 2014-01-26 LAB — CBC WITH DIFFERENTIAL/PLATELET
BASOS ABS: 0 10*3/uL (ref 0.0–0.1)
Basophils Relative: 0.4 % (ref 0.0–3.0)
EOS ABS: 0.1 10*3/uL (ref 0.0–0.7)
Eosinophils Relative: 1.3 % (ref 0.0–5.0)
HCT: 42.1 % (ref 36.0–46.0)
HEMOGLOBIN: 14.6 g/dL (ref 12.0–15.0)
LYMPHS PCT: 21.2 % (ref 12.0–46.0)
Lymphs Abs: 2 10*3/uL (ref 0.7–4.0)
MCHC: 34.5 g/dL (ref 30.0–36.0)
MCV: 88.3 fl (ref 78.0–100.0)
Monocytes Absolute: 0.7 10*3/uL (ref 0.1–1.0)
Monocytes Relative: 7.8 % (ref 3.0–12.0)
NEUTROS ABS: 6.5 10*3/uL (ref 1.4–7.7)
Neutrophils Relative %: 69.3 % (ref 43.0–77.0)
PLATELETS: 271 10*3/uL (ref 150.0–400.0)
RBC: 4.77 Mil/uL (ref 3.87–5.11)
RDW: 13.6 % (ref 11.5–15.5)
WBC: 9.4 10*3/uL (ref 4.0–10.5)

## 2014-01-26 LAB — BASIC METABOLIC PANEL
BUN: 15 mg/dL (ref 6–23)
CALCIUM: 9.1 mg/dL (ref 8.4–10.5)
CO2: 27 meq/L (ref 19–32)
CREATININE: 0.63 mg/dL (ref 0.40–1.20)
Chloride: 106 mEq/L (ref 96–112)
GFR: 109.69 mL/min (ref >60.00)
GLUCOSE: 96 mg/dL (ref 70–99)
Potassium: 4.1 mEq/L (ref 3.5–5.1)
Sodium: 138 mEq/L (ref 135–145)

## 2014-01-26 LAB — LIPID PANEL
CHOL/HDL RATIO: 4
Cholesterol: 150 mg/dL (ref 0–200)
HDL: 41.7 mg/dL (ref >39.00)
LDL Cholesterol: 96 mg/dL (ref 0–99)
NONHDL: 108.3
TRIGLYCERIDES: 64 mg/dL (ref 0.0–149.0)
VLDL: 12.8 mg/dL (ref 0.0–40.0)

## 2014-01-26 LAB — URINALYSIS
Bilirubin Urine: NEGATIVE
Hgb urine dipstick: NEGATIVE
KETONES UR: NEGATIVE
Leukocytes, UA: NEGATIVE
Nitrite: NEGATIVE
PH: 7 (ref 5.0–8.0)
Specific Gravity, Urine: 1.015 (ref 1.000–1.030)
TOTAL PROTEIN, URINE-UPE24: NEGATIVE
Urine Glucose: NEGATIVE
Urobilinogen, UA: 0.2 (ref 0.0–1.0)

## 2014-01-26 LAB — HEPATIC FUNCTION PANEL
ALBUMIN: 3.7 g/dL (ref 3.5–5.2)
ALK PHOS: 78 U/L (ref 39–117)
ALT: 12 U/L (ref 0–35)
AST: 13 U/L (ref 0–37)
Bilirubin, Direct: 0.2 mg/dL (ref 0.0–0.3)
Total Bilirubin: 0.7 mg/dL (ref 0.2–1.2)
Total Protein: 6.8 g/dL (ref 6.0–8.3)

## 2014-01-26 LAB — TSH: TSH: 2.67 u[IU]/mL (ref 0.35–4.50)

## 2014-01-26 LAB — VITAMIN B12

## 2014-01-26 LAB — SEDIMENTATION RATE: SED RATE: 29 mm/h — AB (ref 0–22)

## 2014-01-26 MED ORDER — HYDROCODONE-ACETAMINOPHEN 5-325 MG PO TABS: 1.0000 | ORAL_TABLET | Freq: Four times a day (QID) | ORAL | Status: DC | PRN

## 2014-01-26 NOTE — Telephone Encounter (Signed)
Sorry - pls print one and mail Thx

## 2014-01-26 NOTE — Telephone Encounter (Signed)
Called pt gave her md response. Pt states she actually came up to the office on yesterday spoke with stacey & she gave her the January prescription. Rx was filled yesterday. Will void this prescription that was printed...[name]

## 2014-01-26 NOTE — Telephone Encounter (Signed)
emmi emailed °

## 2014-04-26 ENCOUNTER — Encounter: Payer: Self-pay | Admitting: Internal Medicine

## 2014-04-26 ENCOUNTER — Ambulatory Visit (INDEPENDENT_AMBULATORY_CARE_PROVIDER_SITE_OTHER): Payer: BLUE CROSS/BLUE SHIELD | Admitting: Internal Medicine

## 2014-04-26 VITALS — BP 112/80 | HR 75 | Ht 67.0 in | Wt 327.0 lb

## 2014-04-26 DIAGNOSIS — Z0000 Encounter for general adult medical examination without abnormal findings: Secondary | ICD-10-CM

## 2014-04-26 DIAGNOSIS — M2550 Pain in unspecified joint: Secondary | ICD-10-CM

## 2014-04-26 MED ORDER — HYDROCODONE-ACETAMINOPHEN 5-325 MG PO TABS: 1.0000 | ORAL_TABLET | Freq: Four times a day (QID) | ORAL | Status: DC | PRN

## 2014-04-26 MED ORDER — ALPRAZOLAM 1 MG PO TABS: ORAL_TABLET | ORAL | Status: DC

## 2014-04-26 MED ORDER — PAROXETINE HCL 10 MG PO TABS: 10.0000 mg | ORAL_TABLET | Freq: Every day | ORAL | Status: DC

## 2014-04-26 MED ORDER — TRIAMCINOLONE ACETONIDE 0.5 % EX CREA: 1.0000 "application " | TOPICAL_CREAM | Freq: Three times a day (TID) | CUTANEOUS | Status: DC

## 2014-04-26 MED ORDER — ACYCLOVIR 400 MG PO TABS: 400.0000 mg | ORAL_TABLET | Freq: Three times a day (TID) | ORAL | Status: DC

## 2014-04-26 NOTE — Progress Notes (Signed)
Pre visit review using our clinic review tool, if applicable. No additional management support is needed unless otherwise documented below in the visit note. 

## 2014-04-26 NOTE — Assessment & Plan Note (Signed)
Vit D, B12 labs

## 2014-04-26 NOTE — Progress Notes (Signed)
Subjective:     HPI  The patient is here for a wellness exam.   The patient is here to follow up on chronic R shoulder pain, depression, anxiety, headaches and chronic moderate fibromyalgia symptoms controlled with medicines, diet and exercise. Bought a house in [place] 10/15. Stressed.  F/u bad arthralgias - overall better  F/u severe obesity -- s/p lap band  Wt Readings from Last 3 Encounters:  04/26/14 327 lb (148.326 kg)  01/25/14 318 lb (144.244 kg)  10/22/13 301 lb (136.533 kg)   BP Readings from Last 3 Encounters:  04/26/14 112/80  01/25/14 122/74  10/22/13 112/80        Review of Systems  Constitutional: Negative.  Negative for fever, chills, diaphoresis, activity change, appetite change, fatigue and unexpected weight change.  HENT: Negative for congestion, ear pain, facial swelling, hearing loss, mouth sores, nosebleeds, postnasal drip, rhinorrhea, sinus pressure, sneezing, sore throat, tinnitus and trouble swallowing.   Eyes: Negative for pain, discharge, redness, itching and visual disturbance.  Respiratory: Negative for cough, chest tightness, shortness of breath, wheezing and stridor.   Cardiovascular: Negative for chest pain, palpitations and leg swelling.  Gastrointestinal: Negative for nausea, abdominal pain, diarrhea, constipation, blood in stool, abdominal distention, anal bleeding and rectal pain.  Genitourinary: Negative for dysuria, urgency, frequency, hematuria, flank pain, vaginal bleeding, vaginal discharge, difficulty urinating, genital sores and pelvic pain.  Musculoskeletal: Positive for back pain. Negative for joint swelling, arthralgias, gait problem, neck pain and neck stiffness.  Skin: Negative.  Negative for rash.  Neurological: Negative for dizziness, tremors, seizures, syncope, speech difficulty, weakness, numbness and headaches.  Hematological: Negative for adenopathy. Does not bruise/bleed easily.  Psychiatric/Behavioral: Negative for  suicidal ideas, behavioral problems, sleep disturbance, dysphoric mood and decreased concentration. The patient is not nervous/anxious.            Objective:   Physical Exam  Constitutional: She appears well-developed. No distress.  Obese  HENT:  Head: Normocephalic.  Right Ear: External ear normal.  Left Ear: External ear normal.  Nose: Nose normal.  Mouth/Throat: Oropharynx is clear and moist.  Eyes: Conjunctivae are normal. Pupils are equal, round, and reactive to light. Right eye exhibits no discharge. Left eye exhibits no discharge.  Neck: Normal range of motion. Neck supple. No JVD present. No tracheal deviation present. No thyromegaly present.  Cardiovascular: Normal rate, regular rhythm and normal heart sounds.   Pulmonary/Chest: No stridor. No respiratory distress. She has no wheezes.  Abdominal: Soft. Bowel sounds are normal. She exhibits no distension and no mass. There is no tenderness. There is no rebound and no guarding.  Musculoskeletal: She exhibits no edema or tenderness.  Lymphadenopathy:    She has no cervical adenopathy.  Neurological: She displays normal reflexes. No cranial nerve deficit. She exhibits normal muscle tone. Coordination normal.  Skin: No rash noted. No erythema.  Psychiatric: She has a normal mood and affect. Her behavior is normal. Judgment and thought content normal.   Lab Results  Component Value Date   WBC 9.4 01/26/2014   HGB 14.6 01/26/2014   HCT 42.1 01/26/2014   PLT 271.0 01/26/2014   GLUCOSE 96 01/26/2014   CHOL 150 01/26/2014   TRIG 64.0 01/26/2014   HDL 41.70 01/26/2014   LDLCALC 96 01/26/2014   ALT 12 01/26/2014   AST 13 01/26/2014   NA 138 01/26/2014   K 4.1 01/26/2014   CL 106 01/26/2014   CREATININE 0.63 01/26/2014   BUN 15 01/26/2014   CO2 27  01/26/2014   TSH 2.67 01/26/2014   INR 1.0 08/09/2007   HGBA1C 5.7 11/05/2007          Assessment & Plan:

## 2014-04-26 NOTE — Assessment & Plan Note (Signed)
We discussed age appropriate health related issues, including available/recomended screening tests and vaccinations. We discussed a need for adhering to healthy diet and exercise. Labs/EKG were reviewed/ordered. All questions were answered.

## 2014-05-01 ENCOUNTER — Encounter: Payer: Self-pay | Admitting: Internal Medicine

## 2014-05-30 ENCOUNTER — Other Ambulatory Visit (INDEPENDENT_AMBULATORY_CARE_PROVIDER_SITE_OTHER): Payer: BLUE CROSS/BLUE SHIELD

## 2014-05-30 DIAGNOSIS — Z79899 Other long term (current) drug therapy: Secondary | ICD-10-CM

## 2014-05-30 DIAGNOSIS — I10 Essential (primary) hypertension: Secondary | ICD-10-CM

## 2014-05-30 DIAGNOSIS — Z0000 Encounter for general adult medical examination without abnormal findings: Secondary | ICD-10-CM

## 2014-05-30 DIAGNOSIS — E559 Vitamin D deficiency, unspecified: Secondary | ICD-10-CM | POA: Diagnosis not present

## 2014-05-30 LAB — URINALYSIS
BILIRUBIN URINE: NEGATIVE
HGB URINE DIPSTICK: NEGATIVE
Ketones, ur: NEGATIVE
LEUKOCYTES UA: NEGATIVE
Nitrite: NEGATIVE
PH: 6 (ref 5.0–8.0)
SPECIFIC GRAVITY, URINE: 1.02 (ref 1.000–1.030)
TOTAL PROTEIN, URINE-UPE24: NEGATIVE
URINE GLUCOSE: NEGATIVE
Urobilinogen, UA: 0.2 (ref 0.0–1.0)

## 2014-05-30 LAB — BASIC METABOLIC PANEL
BUN: 11 mg/dL (ref 6–23)
CALCIUM: 9.1 mg/dL (ref 8.4–10.5)
CO2: 27 mEq/L (ref 19–32)
Chloride: 106 mEq/L (ref 96–112)
Creatinine, Ser: 0.61 mg/dL (ref 0.40–1.20)
GFR: 113.66 mL/min (ref >60.00)
GLUCOSE: 104 mg/dL — AB (ref 70–99)
Potassium: 4.3 mEq/L (ref 3.5–5.1)
Sodium: 139 mEq/L (ref 135–145)

## 2014-05-30 LAB — CBC WITH DIFFERENTIAL/PLATELET
BASOS PCT: 0.5 % (ref 0.0–3.0)
Basophils Absolute: 0 10*3/uL (ref 0.0–0.1)
Eosinophils Absolute: 0.1 10*3/uL (ref 0.0–0.7)
Eosinophils Relative: 1.2 % (ref 0.0–5.0)
HEMATOCRIT: 44.3 % (ref 36.0–46.0)
HEMOGLOBIN: 15.1 g/dL — AB (ref 12.0–15.0)
LYMPHS PCT: 20.2 % (ref 12.0–46.0)
Lymphs Abs: 1.8 10*3/uL (ref 0.7–4.0)
MCHC: 34.1 g/dL (ref 30.0–36.0)
MCV: 89 fl (ref 78.0–100.0)
MONO ABS: 0.7 10*3/uL (ref 0.1–1.0)
MONOS PCT: 8.3 % (ref 3.0–12.0)
Neutro Abs: 6.2 10*3/uL (ref 1.4–7.7)
Neutrophils Relative %: 69.8 % (ref 43.0–77.0)
PLATELETS: 284 10*3/uL (ref 150.0–400.0)
RBC: 4.98 Mil/uL (ref 3.87–5.11)
RDW: 13.8 % (ref 11.5–15.5)
WBC: 8.9 10*3/uL (ref 4.0–10.5)

## 2014-05-30 LAB — VITAMIN B12: VITAMIN B 12: 1036 pg/mL — AB (ref 211–911)

## 2014-05-30 LAB — HEPATIC FUNCTION PANEL
ALBUMIN: 3.7 g/dL (ref 3.5–5.2)
ALK PHOS: 76 U/L (ref 39–117)
ALT: 12 U/L (ref 0–35)
AST: 13 U/L (ref 0–37)
Bilirubin, Direct: 0.1 mg/dL (ref 0.0–0.3)
TOTAL PROTEIN: 6.8 g/dL (ref 6.0–8.3)
Total Bilirubin: 0.6 mg/dL (ref 0.2–1.2)

## 2014-05-30 LAB — LIPID PANEL
CHOL/HDL RATIO: 4
Cholesterol: 162 mg/dL (ref 0–200)
HDL: 42.1 mg/dL (ref >39.00)
LDL CALC: 105 mg/dL — AB (ref 0–99)
NonHDL: 119.9
TRIGLYCERIDES: 73 mg/dL (ref 0.0–149.0)
VLDL: 14.6 mg/dL (ref 0.0–40.0)

## 2014-05-30 LAB — TSH: TSH: 2.15 u[IU]/mL (ref 0.35–4.50)

## 2014-05-30 LAB — VITAMIN D 25 HYDROXY (VIT D DEFICIENCY, FRACTURES): VITD: 19.8 ng/mL — ABNORMAL LOW (ref 30.00–100.00)

## 2014-05-31 MED ORDER — ERGOCALCIFEROL 1.25 MG (50000 UT) PO CAPS: 50000.0000 [IU] | ORAL_CAPSULE | ORAL | Status: DC

## 2014-06-25 ENCOUNTER — Other Ambulatory Visit: Payer: Self-pay | Admitting: Internal Medicine

## 2014-07-04 ENCOUNTER — Encounter: Payer: Self-pay | Admitting: Internal Medicine

## 2014-07-04 ENCOUNTER — Other Ambulatory Visit: Payer: Self-pay | Admitting: Internal Medicine

## 2014-07-04 DIAGNOSIS — R21 Rash and other nonspecific skin eruption: Secondary | ICD-10-CM

## 2014-07-22 ENCOUNTER — Ambulatory Visit (INDEPENDENT_AMBULATORY_CARE_PROVIDER_SITE_OTHER): Payer: BLUE CROSS/BLUE SHIELD | Admitting: Internal Medicine

## 2014-07-22 ENCOUNTER — Encounter: Payer: Self-pay | Admitting: Internal Medicine

## 2014-07-22 VITALS — BP 128/88 | HR 90 | Temp 98.5°F | Wt 329.0 lb

## 2014-07-22 DIAGNOSIS — E559 Vitamin D deficiency, unspecified: Secondary | ICD-10-CM

## 2014-07-22 MED ORDER — HYDROCODONE-ACETAMINOPHEN 5-325 MG PO TABS: 1.0000 | ORAL_TABLET | Freq: Four times a day (QID) | ORAL | Status: DC | PRN

## 2014-07-22 MED ORDER — FLUTICASONE FUROATE-VILANTEROL 100-25 MCG/INH IN AEPB: 1.0000 | INHALATION_SPRAY | Freq: Every day | RESPIRATORY_TRACT | Status: DC

## 2014-07-22 MED ORDER — CHOLECALCIFEROL 25 MCG (1000 UT) PO TABS: 2000.0000 [IU] | ORAL_TABLET | Freq: Every day | ORAL | Status: DC

## 2014-07-22 MED ORDER — ALPRAZOLAM 1 MG PO TABS: ORAL_TABLET | ORAL | Status: DC

## 2014-07-22 NOTE — Assessment & Plan Note (Signed)
On vit D

## 2014-07-22 NOTE — Progress Notes (Signed)
Pre visit review using our clinic review tool, if applicable. No additional management support is needed unless otherwise documented below in the visit note. 

## 2014-07-22 NOTE — Patient Instructions (Signed)
Elevate feet ?venous stasis dermatitis

## 2014-07-22 NOTE — Progress Notes (Signed)
Subjective:  Patient ID: Stephanie Harper, female    DOB: 1970/02/28  Age: 44 y.o. MRN: [id number]  CC: No chief complaint on file.   HPI CLOIS MONTAVON presents for   Outpatient Prescriptions Prior to Visit  Medication Sig Dispense Refill  . acyclovir (ZOVIRAX) 400 MG tablet Take 1 tablet (400 mg total) by mouth 3 (three) times daily. 21 tablet 3  . albuterol (VENTOLIN HFA) 108 (90 BASE) MCG/ACT inhaler Inhale 2 puffs into the lungs every 6 (six) hours as needed for wheezing. 18 g 11  . BIOTIN PO Take 10,000 capsules by mouth daily.    [name] buPROPion (WELLBUTRIN SR) 100 MG 12 hr tablet TAKE 1 TABLET TWICE A DAY 180 tablet 2  . Cholecalciferol 1000 UNITS tablet Take 5,000 Units by mouth daily.     . ergocalciferol (VITAMIN D2) 50000 UNITS capsule Take 1 capsule (50,000 Units total) by mouth once a week. 6 capsule 0  . Fluticasone Furoate-Vilanterol (BREO ELLIPTA) 100-25 MCG/INH AEPB Inhale 1 Act into the lungs daily. 1 each 5  . ibuprofen (ADVIL,MOTRIN) 600 MG tablet TAKE 1 TABLET (600 MG TOTAL) BY MOUTH 2 (TWO) TIMES DAILY AS NEEDED. 180 tablet 2  . Multiple Vitamin (MULTIVITAMIN) tablet Take 1 tablet by mouth 2 (two) times daily.     [name] PARoxetine (PAXIL) 10 MG tablet Take 1 tablet (10 mg total) by mouth daily. 90 tablet 3  . triamcinolone cream (KENALOG) 0.5 % Apply 1 application topically 3 (three) times daily. 45 g 3  . vitamin B-12 (CYANOCOBALAMIN) 1000 MCG tablet Take 1,000 mcg by mouth daily.     [name] ALPRAZolam (XANAX) 1 MG tablet TAKE 1 TABLET BY MOUTH 3 TIMES A DAY AS NEEDED FOR ANXIETY OR SLEEP 90 tablet 3  . HYDROcodone-acetaminophen (NORCO/VICODIN) 5-325 MG per tablet Take 1 tablet by mouth every 6 (six) hours as needed for moderate pain or severe pain. Please fill on or after 06/26/14 120 tablet 0   No facility-administered medications prior to visit.    ROS Review of Systems  Constitutional: Negative for chills, activity change, appetite change, fatigue and unexpected  weight change.  HENT: Negative for congestion, mouth sores and sinus pressure.   Eyes: Negative for visual disturbance.  Respiratory: Negative for cough and chest tightness.   Gastrointestinal: Negative for nausea and abdominal pain.  Genitourinary: Negative for frequency, difficulty urinating and vaginal pain.  Musculoskeletal: Positive for back pain and arthralgias. Negative for gait problem.  Skin: Negative for pallor and rash.  Neurological: Negative for dizziness, tremors, weakness, numbness and headaches.  Psychiatric/Behavioral: Negative for confusion and sleep disturbance. The patient is not nervous/anxious.     Objective:  BP 128/88 mmHg  Pulse 90  Temp(Src) 98.5 F (36.9 C) (Oral)  Wt 329 lb (149.233 kg)  SpO2 97%  BP Readings from Last 3 Encounters:  07/22/14 128/88  04/26/14 112/80  01/25/14 122/74    Wt Readings from Last 3 Encounters:  07/22/14 329 lb (149.233 kg)  04/26/14 327 lb (148.326 kg)  01/25/14 318 lb (144.244 kg)    Physical Exam  Constitutional: She appears well-developed. No distress.  HENT:  Head: Normocephalic.  Right Ear: External ear normal.  Left Ear: External ear normal.  Nose: Nose normal.  Mouth/Throat: Oropharynx is clear and moist.  Eyes: Conjunctivae are normal. Pupils are equal, round, and reactive to light. Right eye exhibits no discharge. Left eye exhibits no discharge.  Neck: Normal range of motion. Neck supple. No JVD present. No  tracheal deviation present. No thyromegaly present.  Cardiovascular: Normal rate, regular rhythm and normal heart sounds.   Pulmonary/Chest: No stridor. No respiratory distress. She has no wheezes.  Abdominal: Soft. Bowel sounds are normal. She exhibits no distension and no mass. There is no tenderness. There is no rebound and no guarding.  Musculoskeletal: She exhibits tenderness. She exhibits no edema.  Lymphadenopathy:    She has no cervical adenopathy.  Neurological: She displays normal reflexes.  No cranial nerve deficit. She exhibits normal muscle tone. Coordination normal.  Skin: No rash noted. No erythema.  Psychiatric: She has a normal mood and affect. Her behavior is normal. Judgment and thought content normal.  LS tender Obese  Lab Results  Component Value Date   WBC 8.9 05/30/2014   HGB 15.1* 05/30/2014   HCT 44.3 05/30/2014   PLT 284.0 05/30/2014   GLUCOSE 104* 05/30/2014   CHOL 162 05/30/2014   TRIG 73.0 05/30/2014   HDL 42.10 05/30/2014   LDLCALC 105* 05/30/2014   ALT 12 05/30/2014   AST 13 05/30/2014   NA 139 05/30/2014   K 4.3 05/30/2014   CL 106 05/30/2014   CREATININE 0.61 05/30/2014   BUN 11 05/30/2014   CO2 27 05/30/2014   TSH 2.15 05/30/2014   INR 1.0 08/09/2007   HGBA1C 5.7 11/05/2007    Mm Digital Screening  05/11/2012   *RADIOLOGY REPORT*  Clinical Data: Screening.  DIGITAL BILATERAL SCREENING MAMMOGRAM WITH CAD  Comparison:  Previous exams.  FINDINGS:  ACR Breast Density Category 2: There is a scattered fibroglandular pattern.  No suspicious masses, architectural distortion, or calcifications are present.  Images were processed with CAD.  IMPRESSION: No mammographic evidence of malignancy.  A result letter of this screening mammogram will be mailed directly to the patient.  RECOMMENDATION: Screening mammogram in one year. (Code:SM-B-01Y)  BI-RADS CATEGORY 2:  Benign finding(s).   Original Report Authenticated By: Abelardo Diesel, M.D.    Assessment & Plan:   Diagnoses and all orders for this visit:  Vitamin D deficiency  Other orders -     ALPRAZolam (XANAX) 1 MG tablet; TAKE 1 TABLET BY MOUTH 3 TIMES A DAY AS NEEDED FOR ANXIETY OR SLEEP -     Discontinue: HYDROcodone-acetaminophen (NORCO/VICODIN) 5-325 MG per tablet; Take 1 tablet by mouth every 6 (six) hours as needed for moderate pain or severe pain. Please fill on or after 07/26/14 -     Discontinue: HYDROcodone-acetaminophen (NORCO/VICODIN) 5-325 MG per tablet; Take 1 tablet by mouth every 6  (six) hours as needed for moderate pain or severe pain. Please fill on or after 08/26/14 -     HYDROcodone-acetaminophen (NORCO/VICODIN) 5-325 MG per tablet; Take 1 tablet by mouth every 6 (six) hours as needed for moderate pain or severe pain. Please fill on or after 09/26/14   I have discontinued Ms. Sallas's HYDROcodone-acetaminophen and HYDROcodone-acetaminophen. I have also changed her HYDROcodone-acetaminophen. Additionally, I am having her maintain her vitamin B-12, Cholecalciferol, multivitamin, BIOTIN PO, Fluticasone Furoate-Vilanterol, buPROPion, albuterol, PARoxetine, triamcinolone cream, acyclovir, ergocalciferol, ibuprofen, and ALPRAZolam.  Meds ordered this encounter  Medications  . ALPRAZolam (XANAX) 1 MG tablet    Sig: TAKE 1 TABLET BY MOUTH 3 TIMES A DAY AS NEEDED FOR ANXIETY OR SLEEP    Dispense:  90 tablet    Refill:  3  . DISCONTD: HYDROcodone-acetaminophen (NORCO/VICODIN) 5-325 MG per tablet    Sig: Take 1 tablet by mouth every 6 (six) hours as needed for moderate pain or severe pain. Please  fill on or after 07/26/14    Dispense:  120 tablet    Refill:  0  . DISCONTD: HYDROcodone-acetaminophen (NORCO/VICODIN) 5-325 MG per tablet    Sig: Take 1 tablet by mouth every 6 (six) hours as needed for moderate pain or severe pain. Please fill on or after 08/26/14    Dispense:  120 tablet    Refill:  0  . HYDROcodone-acetaminophen (NORCO/VICODIN) 5-325 MG per tablet    Sig: Take 1 tablet by mouth every 6 (six) hours as needed for moderate pain or severe pain. Please fill on or after 09/26/14    Dispense:  120 tablet    Refill:  0     Follow-up: Return in about 3 months (around 10/22/2014) for a follow-up visit.  Walker Kehr, MD

## 2014-08-15 DIAGNOSIS — L309 Dermatitis, unspecified: Secondary | ICD-10-CM | POA: Diagnosis not present

## 2014-09-05 ENCOUNTER — Other Ambulatory Visit: Payer: Self-pay | Admitting: Dermatology

## 2014-09-05 DIAGNOSIS — D485 Neoplasm of uncertain behavior of skin: Secondary | ICD-10-CM | POA: Diagnosis not present

## 2014-09-05 DIAGNOSIS — L309 Dermatitis, unspecified: Secondary | ICD-10-CM | POA: Diagnosis not present

## 2014-09-05 DIAGNOSIS — L921 Necrobiosis lipoidica, not elsewhere classified: Secondary | ICD-10-CM | POA: Diagnosis not present

## 2014-09-20 DIAGNOSIS — Z79899 Other long term (current) drug therapy: Secondary | ICD-10-CM | POA: Diagnosis not present

## 2014-09-20 DIAGNOSIS — L921 Necrobiosis lipoidica, not elsewhere classified: Secondary | ICD-10-CM | POA: Diagnosis not present

## 2014-09-21 ENCOUNTER — Other Ambulatory Visit: Payer: Self-pay | Admitting: Internal Medicine

## 2014-09-30 ENCOUNTER — Encounter: Payer: Self-pay | Admitting: Internal Medicine

## 2014-10-14 ENCOUNTER — Ambulatory Visit (INDEPENDENT_AMBULATORY_CARE_PROVIDER_SITE_OTHER): Payer: BLUE CROSS/BLUE SHIELD | Admitting: Internal Medicine

## 2014-10-14 ENCOUNTER — Encounter: Payer: Self-pay | Admitting: Internal Medicine

## 2014-10-14 VITALS — BP 110/80 | HR 76 | Wt 332.0 lb

## 2014-10-14 DIAGNOSIS — F419 Anxiety disorder, unspecified: Secondary | ICD-10-CM

## 2014-10-14 DIAGNOSIS — E661 Drug-induced obesity: Secondary | ICD-10-CM | POA: Diagnosis not present

## 2014-10-14 DIAGNOSIS — I872 Venous insufficiency (chronic) (peripheral): Secondary | ICD-10-CM | POA: Diagnosis not present

## 2014-10-14 DIAGNOSIS — Z23 Encounter for immunization: Secondary | ICD-10-CM | POA: Diagnosis not present

## 2014-10-14 DIAGNOSIS — R21 Rash and other nonspecific skin eruption: Secondary | ICD-10-CM

## 2014-10-14 MED ORDER — HYDROCODONE-ACETAMINOPHEN 5-325 MG PO TABS: 1.0000 | ORAL_TABLET | Freq: Four times a day (QID) | ORAL | Status: DC | PRN

## 2014-10-14 MED ORDER — ALPRAZOLAM 1 MG PO TABS: ORAL_TABLET | ORAL | Status: DC

## 2014-10-14 MED ORDER — VASCULERA PO TABS: 1.0000 | ORAL_TABLET | Freq: Two times a day (BID) | ORAL | Status: DC

## 2014-10-14 NOTE — Assessment & Plan Note (Signed)
Recurrent  Norco prn  Potential benefits of a long term opioids use as well as potential risks (i.e. addiction risk, apnea etc) and complications (i.e. Somnolence, constipation and others) were explained to the patient and were aknowledged.

## 2014-10-14 NOTE — Progress Notes (Signed)
Pre visit review using our clinic review tool, if applicable. No additional management support is needed unless otherwise documented below in the visit note. 

## 2014-10-14 NOTE — Progress Notes (Signed)
Subjective:  Patient ID: Stephanie Harper, female    DOB: 07/14/1970  Age: 44 y.o. MRN: [id number]  CC: No chief complaint on file.   HPI Stephanie Harper presents for OA, anxiety, rash f/u  Outpatient Prescriptions Prior to Visit  Medication Sig Dispense Refill  . acyclovir (ZOVIRAX) 400 MG tablet Take 1 tablet (400 mg total) by mouth 3 (three) times daily. 21 tablet 3  . albuterol (VENTOLIN HFA) 108 (90 BASE) MCG/ACT inhaler Inhale 2 puffs into the lungs every 6 (six) hours as needed for wheezing. 18 g 11  . BIOTIN PO Take 10,000 capsules by mouth daily.    [name] buPROPion (WELLBUTRIN SR) 100 MG 12 hr tablet TAKE 1 TABLET TWICE A DAY 180 tablet 1  . Cholecalciferol 1000 UNITS tablet Take 2 tablets (2,000 Units total) by mouth daily. 100 tablet 3  . Fluticasone Furoate-Vilanterol (BREO ELLIPTA) 100-25 MCG/INH AEPB Inhale 1 Act into the lungs daily. 1 each 5  . ibuprofen (ADVIL,MOTRIN) 600 MG tablet TAKE 1 TABLET (600 MG TOTAL) BY MOUTH 2 (TWO) TIMES DAILY AS NEEDED. 180 tablet 2  . Multiple Vitamin (MULTIVITAMIN) tablet Take 1 tablet by mouth 2 (two) times daily.     [name] PARoxetine (PAXIL) 10 MG tablet Take 1 tablet (10 mg total) by mouth daily. 90 tablet 3  . triamcinolone cream (KENALOG) 0.5 % Apply 1 application topically 3 (three) times daily. 45 g 3  . vitamin B-12 (CYANOCOBALAMIN) 1000 MCG tablet Take 1,000 mcg by mouth daily.     [name] ALPRAZolam (XANAX) 1 MG tablet TAKE 1 TABLET BY MOUTH 3 TIMES A DAY AS NEEDED FOR ANXIETY OR SLEEP 90 tablet 3  . HYDROcodone-acetaminophen (NORCO/VICODIN) 5-325 MG per tablet Take 1 tablet by mouth every 6 (six) hours as needed for moderate pain or severe pain. Please fill on or after 09/26/14 120 tablet 0   No facility-administered medications prior to visit.    ROS Review of Systems  Constitutional: Positive for fatigue. Negative for chills, activity change, appetite change and unexpected weight change.  HENT: Negative for congestion, mouth sores  and sinus pressure.   Eyes: Negative for visual disturbance.  Respiratory: Negative for cough and chest tightness.   Gastrointestinal: Negative for nausea and abdominal pain.  Genitourinary: Negative for frequency, difficulty urinating and vaginal pain.  Musculoskeletal: Positive for back pain and arthralgias. Negative for gait problem.  Skin: Negative for pallor and rash.  Neurological: Positive for headaches. Negative for dizziness, tremors, weakness and numbness.  Psychiatric/Behavioral: Negative for suicidal ideas, confusion and sleep disturbance.    Objective:  BP 110/80 mmHg  Pulse 76  Wt 332 lb (150.594 kg)  SpO2 97%  BP Readings from Last 3 Encounters:  10/14/14 110/80  07/22/14 128/88  04/26/14 112/80    Wt Readings from Last 3 Encounters:  10/14/14 332 lb (150.594 kg)  07/22/14 329 lb (149.233 kg)  04/26/14 327 lb (148.326 kg)    Physical Exam  Constitutional: She appears well-developed. No distress.  HENT:  Head: Normocephalic.  Right Ear: External ear normal.  Left Ear: External ear normal.  Nose: Nose normal.  Mouth/Throat: Oropharynx is clear and moist.  Eyes: Conjunctivae are normal. Pupils are equal, round, and reactive to light. Right eye exhibits no discharge. Left eye exhibits no discharge.  Neck: Normal range of motion. Neck supple. No JVD present. No tracheal deviation present. No thyromegaly present.  Cardiovascular: Normal rate, regular rhythm and normal heart sounds.   Pulmonary/Chest: No stridor. No respiratory  distress. She has no wheezes.  Abdominal: Soft. Bowel sounds are normal. She exhibits no distension and no mass. There is no tenderness. There is no rebound and no guarding.  Musculoskeletal: She exhibits tenderness. She exhibits no edema.  Lymphadenopathy:    She has no cervical adenopathy.  Neurological: She displays normal reflexes. No cranial nerve deficit. She exhibits normal muscle tone. Coordination normal.  Skin: Rash noted. No  erythema.  Psychiatric: She has a normal mood and affect. Her behavior is normal. Judgment and thought content normal.    Lab Results  Component Value Date   WBC 8.9 05/30/2014   HGB 15.1* 05/30/2014   HCT 44.3 05/30/2014   PLT 284.0 05/30/2014   GLUCOSE 104* 05/30/2014   CHOL 162 05/30/2014   TRIG 73.0 05/30/2014   HDL 42.10 05/30/2014   LDLCALC 105* 05/30/2014   ALT 12 05/30/2014   AST 13 05/30/2014   NA 139 05/30/2014   K 4.3 05/30/2014   CL 106 05/30/2014   CREATININE 0.61 05/30/2014   BUN 11 05/30/2014   CO2 27 05/30/2014   TSH 2.15 05/30/2014   INR 1.0 08/09/2007   HGBA1C 5.7 11/05/2007    Mm Digital Screening  05/11/2012   *RADIOLOGY REPORT*  Clinical Data: Screening.  DIGITAL BILATERAL SCREENING MAMMOGRAM WITH CAD  Comparison:  Previous exams.  FINDINGS:  ACR Breast Density Category 2: There is a scattered fibroglandular pattern.  No suspicious masses, architectural distortion, or calcifications are present.  Images were processed with CAD.  IMPRESSION: No mammographic evidence of malignancy.  A result letter of this screening mammogram will be mailed directly to the patient.  RECOMMENDATION: Screening mammogram in one year. (Code:SM-B-01Y)  BI-RADS CATEGORY 2:  Benign finding(s).   Original Report Authenticated By: Abelardo Diesel, M.D.    Assessment & Plan:   Diagnoses and all orders for this visit:  Morbid drug-induced obesity  Rash and nonspecific skin eruption  Chronic anxiety  Venous insufficiency of both lower extremities  Need for influenza vaccination -     Flu Vaccine QUAD 36+ mos IM  Other orders -     ALPRAZolam (XANAX) 1 MG tablet; TAKE 1 TABLET BY MOUTH 3 TIMES A DAY AS NEEDED FOR ANXIETY OR SLEEP -     Discontinue: HYDROcodone-acetaminophen (NORCO/VICODIN) 5-325 MG tablet; Take 1 tablet by mouth every 6 (six) hours as needed for moderate pain or severe pain. Please fill on or after 10/26/14 -     Discontinue: HYDROcodone-acetaminophen  (NORCO/VICODIN) 5-325 MG tablet; Take 1 tablet by mouth every 6 (six) hours as needed for moderate pain or severe pain. Please fill on or after 11/26/14 -     HYDROcodone-acetaminophen (NORCO/VICODIN) 5-325 MG tablet; Take 1 tablet by mouth every 6 (six) hours as needed for moderate pain or severe pain. Please fill on or after 12/26/14 -     Dietary Management Product (VASCULERA) TABS; Take 1 tablet by mouth 2 (two) times daily.   I have discontinued Ms. Sultana's HYDROcodone-acetaminophen and HYDROcodone-acetaminophen. I have also changed her HYDROcodone-acetaminophen. Additionally, I am having her start on VASCULERA. Lastly, I am having her maintain her vitamin B-12, multivitamin, BIOTIN PO, albuterol, PARoxetine, triamcinolone cream, acyclovir, ibuprofen, Cholecalciferol, Fluticasone Furoate-Vilanterol, buPROPion, clobetasol cream, and ALPRAZolam.  Meds ordered this encounter  Medications  . clobetasol cream (TEMOVATE) 0.05 %    Sig: APPLY TO LEG UNDER MOIST COMPRESS AT BEDTIME (NOT FOR FACE OR FOLDS)    Refill:  2  . ALPRAZolam (XANAX) 1 MG tablet  Sig: TAKE 1 TABLET BY MOUTH 3 TIMES A DAY AS NEEDED FOR ANXIETY OR SLEEP    Dispense:  90 tablet    Refill:  3  . DISCONTD: HYDROcodone-acetaminophen (NORCO/VICODIN) 5-325 MG tablet    Sig: Take 1 tablet by mouth every 6 (six) hours as needed for moderate pain or severe pain. Please fill on or after 10/26/14    Dispense:  120 tablet    Refill:  0  . DISCONTD: HYDROcodone-acetaminophen (NORCO/VICODIN) 5-325 MG tablet    Sig: Take 1 tablet by mouth every 6 (six) hours as needed for moderate pain or severe pain. Please fill on or after 11/26/14    Dispense:  120 tablet    Refill:  0  . HYDROcodone-acetaminophen (NORCO/VICODIN) 5-325 MG tablet    Sig: Take 1 tablet by mouth every 6 (six) hours as needed for moderate pain or severe pain. Please fill on or after 12/26/14    Dispense:  120 tablet    Refill:  0  . Dietary Management Product  (VASCULERA) TABS    Sig: Take 1 tablet by mouth 2 (two) times daily.    Dispense:  60 tablet    Refill:  11     Follow-up: Return in about 3 months (around 01/14/2015) for a follow-up visit.  Walker Kehr, MD

## 2014-10-14 NOTE — Assessment & Plan Note (Addendum)
L shin chronic ?etiology Necrobiosis lipoica - s/p bx 2016 - Dr Denna Haggard Try Lacretia Nicks

## 2014-10-14 NOTE — Assessment & Plan Note (Signed)
Chronic  Xanax prn  Potential benefits of a long term benzodiazepines  use as well as potential risks  and complications were explained to the patient and were aknowledged.

## 2014-10-14 NOTE — Assessment & Plan Note (Signed)
Vasculera to try

## 2014-10-14 NOTE — Assessment & Plan Note (Signed)
F/u wt loss

## 2014-10-21 ENCOUNTER — Ambulatory Visit: Payer: Self-pay | Admitting: Internal Medicine

## 2014-11-17 ENCOUNTER — Ambulatory Visit
Admission: RE | Admit: 2014-11-17 | Discharge: 2014-11-17 | Disposition: A | Discharge status: Home / Self Care | Payer: BLUE CROSS/BLUE SHIELD | Source: Ambulatory Visit | Attending: Physician Assistant | Admitting: Physician Assistant

## 2014-11-17 ENCOUNTER — Other Ambulatory Visit: Payer: Self-pay | Admitting: Physician Assistant

## 2014-11-17 DIAGNOSIS — Z4651 Encounter for fitting and adjustment of gastric lap band: Secondary | ICD-10-CM

## 2014-12-22 DIAGNOSIS — Z4651 Encounter for fitting and adjustment of gastric lap band: Secondary | ICD-10-CM | POA: Diagnosis not present

## 2015-01-17 ENCOUNTER — Encounter: Payer: Self-pay | Admitting: Internal Medicine

## 2015-01-17 ENCOUNTER — Ambulatory Visit (INDEPENDENT_AMBULATORY_CARE_PROVIDER_SITE_OTHER): Payer: BLUE CROSS/BLUE SHIELD | Admitting: Internal Medicine

## 2015-01-17 VITALS — BP 138/80 | HR 81 | Wt 346.0 lb

## 2015-01-17 DIAGNOSIS — I10 Essential (primary) hypertension: Secondary | ICD-10-CM | POA: Diagnosis not present

## 2015-01-17 DIAGNOSIS — F419 Anxiety disorder, unspecified: Secondary | ICD-10-CM | POA: Diagnosis not present

## 2015-01-17 MED ORDER — HYDROCODONE-ACETAMINOPHEN 5-325 MG PO TABS: 1.0000 | ORAL_TABLET | Freq: Four times a day (QID) | ORAL | Status: DC | PRN

## 2015-01-17 MED ORDER — FUROSEMIDE 40 MG PO TABS: 40.0000 mg | ORAL_TABLET | Freq: Every day | ORAL | Status: DC | PRN

## 2015-01-17 MED ORDER — ALPRAZOLAM 1 MG PO TABS: ORAL_TABLET | ORAL | Status: DC

## 2015-01-17 NOTE — Progress Notes (Signed)
Pre visit review using our clinic review tool, if applicable. No additional management support is needed unless otherwise documented below in the visit note. 

## 2015-01-17 NOTE — Progress Notes (Signed)
Subjective:  Patient ID: Stephanie Harper, female    DOB: 1970/07/05  Age: 45 y.o. MRN: [id number]  CC: No chief complaint on file.   HPI Stephanie Harper presents for chronic pain, anxiety, B12 def f/u  Outpatient Prescriptions Prior to Visit  Medication Sig Dispense Refill  . acyclovir (ZOVIRAX) 400 MG tablet Take 1 tablet (400 mg total) by mouth 3 (three) times daily. 21 tablet 3  . albuterol (VENTOLIN HFA) 108 (90 BASE) MCG/ACT inhaler Inhale 2 puffs into the lungs every 6 (six) hours as needed for wheezing. 18 g 11  . BIOTIN PO Take 10,000 capsules by mouth daily.    [name] buPROPion (WELLBUTRIN SR) 100 MG 12 hr tablet TAKE 1 TABLET TWICE A DAY 180 tablet 1  . Cholecalciferol 1000 UNITS tablet Take 2 tablets (2,000 Units total) by mouth daily. 100 tablet 3  . clobetasol cream (TEMOVATE) 0.05 % APPLY TO LEG UNDER MOIST COMPRESS AT BEDTIME (NOT FOR FACE OR FOLDS)  2  . Dietary Management Product (VASCULERA) TABS Take 1 tablet by mouth 2 (two) times daily. 60 tablet 11  . Fluticasone Furoate-Vilanterol (BREO ELLIPTA) 100-25 MCG/INH AEPB Inhale 1 Act into the lungs daily. 1 each 5  . ibuprofen (ADVIL,MOTRIN) 600 MG tablet TAKE 1 TABLET (600 MG TOTAL) BY MOUTH 2 (TWO) TIMES DAILY AS NEEDED. 180 tablet 2  . Multiple Vitamin (MULTIVITAMIN) tablet Take 1 tablet by mouth 2 (two) times daily.     [name] PARoxetine (PAXIL) 10 MG tablet Take 1 tablet (10 mg total) by mouth daily. 90 tablet 3  . triamcinolone cream (KENALOG) 0.5 % Apply 1 application topically 3 (three) times daily. 45 g 3  . vitamin B-12 (CYANOCOBALAMIN) 1000 MCG tablet Take 1,000 mcg by mouth daily.     [name] ALPRAZolam (XANAX) 1 MG tablet TAKE 1 TABLET BY MOUTH 3 TIMES A DAY AS NEEDED FOR ANXIETY OR SLEEP 90 tablet 3  . HYDROcodone-acetaminophen (NORCO/VICODIN) 5-325 MG tablet Take 1 tablet by mouth every 6 (six) hours as needed for moderate pain or severe pain. Please fill on or after 12/26/14 120 tablet 0   No facility-administered  medications prior to visit.    ROS Review of Systems  Constitutional: Negative for chills, activity change, appetite change, fatigue and unexpected weight change.  HENT: Negative for congestion, mouth sores and sinus pressure.   Eyes: Negative for visual disturbance.  Respiratory: Negative for cough and chest tightness.   Gastrointestinal: Negative for nausea and abdominal pain.  Genitourinary: Negative for frequency, difficulty urinating and vaginal pain.  Musculoskeletal: Negative for back pain and gait problem.  Skin: Negative for pallor and rash.  Neurological: Negative for dizziness, tremors, weakness, numbness and headaches.  Psychiatric/Behavioral: Negative for suicidal ideas, confusion and sleep disturbance.    Objective:  BP 138/80 mmHg  Pulse 81  Wt 346 lb (156.945 kg)  SpO2 97%  BP Readings from Last 3 Encounters:  01/17/15 138/80  10/14/14 110/80  07/22/14 128/88    Wt Readings from Last 3 Encounters:  01/17/15 346 lb (156.945 kg)  10/14/14 332 lb (150.594 kg)  07/22/14 329 lb (149.233 kg)    Physical Exam  Constitutional: She appears well-developed. No distress.  HENT:  Head: Normocephalic.  Right Ear: External ear normal.  Left Ear: External ear normal.  Nose: Nose normal.  Mouth/Throat: Oropharynx is clear and moist.  Eyes: Conjunctivae are normal. Pupils are equal, round, and reactive to light. Right eye exhibits no discharge. Left eye exhibits no discharge.  Neck: Normal range of motion. Neck supple. No JVD present. No tracheal deviation present. No thyromegaly present.  Cardiovascular: Normal rate, regular rhythm and normal heart sounds.   Pulmonary/Chest: No stridor. No respiratory distress. She has no wheezes.  Abdominal: Soft. Bowel sounds are normal. She exhibits no distension and no mass. There is no tenderness. There is no rebound and no guarding.  Musculoskeletal: She exhibits no edema or tenderness.  Lymphadenopathy:    She has no cervical  adenopathy.  Neurological: She displays normal reflexes. No cranial nerve deficit. She exhibits normal muscle tone. Coordination normal.  Skin: Rash noted. No erythema.  Psychiatric: She has a normal mood and affect. Her behavior is normal. Judgment and thought content normal.  L ankle rash Trace edema  Lab Results  Component Value Date   WBC 8.9 05/30/2014   HGB 15.1* 05/30/2014   HCT 44.3 05/30/2014   PLT 284.0 05/30/2014   GLUCOSE 104* 05/30/2014   CHOL 162 05/30/2014   TRIG 73.0 05/30/2014   HDL 42.10 05/30/2014   LDLCALC 105* 05/30/2014   ALT 12 05/30/2014   AST 13 05/30/2014   NA 139 05/30/2014   K 4.3 05/30/2014   CL 106 05/30/2014   CREATININE 0.61 05/30/2014   BUN 11 05/30/2014   CO2 27 05/30/2014   TSH 2.15 05/30/2014   INR 1.0 08/09/2007   HGBA1C 5.7 11/05/2007    Dg Abd 1 View - Kub  11/17/2014  CLINICAL DATA:  Difficulty with the lap band over the past year, frequent vomiting, fluid removed from lap band today. EXAM: ABDOMEN - 1 VIEW COMPARISON:  KUB of January 22, 2012. FINDINGS: The lap band device appears to be in appropriate position at the expected location of the GE junction. The reservoir and connecting tubing are unremarkable. There is gas within the stomach. The bowel gas pattern is unremarkable. The lung bases are clear. The bony structures are normal. IMPRESSION: No acute intra-abdominal abnormality is observed. The lap band device appears to be in appropriate position. Electronically Signed   By: David  Martinique M.D.   On: 11/17/2014 15:00    Assessment & Plan:   Diagnoses and all orders for this visit:  Chronic anxiety  Essential hypertension  Other orders -     Discontinue: HYDROcodone-acetaminophen (NORCO/VICODIN) 5-325 MG tablet; Take 1 tablet by mouth every 6 (six) hours as needed for moderate pain or severe pain. Please fill on or after 01/26/15 -     ALPRAZolam (XANAX) 1 MG tablet; TAKE 1 TABLET BY MOUTH 3 TIMES A DAY AS NEEDED FOR ANXIETY OR  SLEEP -     Discontinue: HYDROcodone-acetaminophen (NORCO/VICODIN) 5-325 MG tablet; Take 1 tablet by mouth every 6 (six) hours as needed for moderate pain or severe pain. Please fill on or after 02/26/15 -     HYDROcodone-acetaminophen (NORCO/VICODIN) 5-325 MG tablet; Take 1 tablet by mouth every 6 (six) hours as needed for moderate pain or severe pain. Please fill on or after 03/26/15 -     furosemide (LASIX) 40 MG tablet; Take 1 tablet (40 mg total) by mouth daily as needed.   I have discontinued Ms. Lafever's HYDROcodone-acetaminophen and HYDROcodone-acetaminophen. I have also changed her HYDROcodone-acetaminophen. Additionally, I am having her start on furosemide. Lastly, I am having her maintain her vitamin B-12, multivitamin, BIOTIN PO, albuterol, PARoxetine, triamcinolone cream, acyclovir, ibuprofen, Cholecalciferol, Fluticasone Furoate-Vilanterol, buPROPion, clobetasol cream, VASCULERA, and ALPRAZolam.  Meds ordered this encounter  Medications  . DISCONTD: HYDROcodone-acetaminophen (NORCO/VICODIN) 5-325 MG tablet  Sig: Take 1 tablet by mouth every 6 (six) hours as needed for moderate pain or severe pain. Please fill on or after 01/26/15    Dispense:  120 tablet    Refill:  0  . ALPRAZolam (XANAX) 1 MG tablet    Sig: TAKE 1 TABLET BY MOUTH 3 TIMES A DAY AS NEEDED FOR ANXIETY OR SLEEP    Dispense:  90 tablet    Refill:  3  . DISCONTD: HYDROcodone-acetaminophen (NORCO/VICODIN) 5-325 MG tablet    Sig: Take 1 tablet by mouth every 6 (six) hours as needed for moderate pain or severe pain. Please fill on or after 02/26/15    Dispense:  120 tablet    Refill:  0  . HYDROcodone-acetaminophen (NORCO/VICODIN) 5-325 MG tablet    Sig: Take 1 tablet by mouth every 6 (six) hours as needed for moderate pain or severe pain. Please fill on or after 03/26/15    Dispense:  120 tablet    Refill:  0  . furosemide (LASIX) 40 MG tablet    Sig: Take 1 tablet (40 mg total) by mouth daily as needed.     Dispense:  30 tablet    Refill:  5     Follow-up: Return in about 3 months (around 04/17/2015) for a follow-up visit.  Walker Kehr, MD

## 2015-01-17 NOTE — Assessment & Plan Note (Signed)
Xanax prn  Potential benefits of a long term benzodiazepines  use as well as potential risks  and complications were explained to the patient and were aknowledged.

## 2015-01-17 NOTE — Assessment & Plan Note (Signed)
Furosemide prn

## 2015-01-26 ENCOUNTER — Telehealth: Payer: Self-pay | Admitting: Internal Medicine

## 2015-01-26 NOTE — Telephone Encounter (Signed)
Pt informed

## 2015-01-26 NOTE — Telephone Encounter (Signed)
Pt has a neighbor who is 6 with fibromyalgia and is need of a new doctor.  Are you able to take her on. Please advise

## 2015-01-26 NOTE — Telephone Encounter (Signed)
Unfortunately, I'm not able to accept any more new patients at this time.  I'm sorry! Thank you!

## 2015-02-11 ENCOUNTER — Other Ambulatory Visit: Payer: Self-pay | Admitting: Internal Medicine

## 2015-02-15 ENCOUNTER — Other Ambulatory Visit: Payer: Self-pay | Admitting: *Deleted

## 2015-02-15 MED ORDER — FLUTICASONE FUROATE-VILANTEROL 100-25 MCG/INH IN AEPB: 1.0000 | INHALATION_SPRAY | Freq: Every day | RESPIRATORY_TRACT | Status: DC

## 2015-03-14 ENCOUNTER — Other Ambulatory Visit: Payer: Self-pay | Admitting: Internal Medicine

## 2015-04-19 ENCOUNTER — Encounter: Payer: Self-pay | Admitting: Internal Medicine

## 2015-04-19 ENCOUNTER — Ambulatory Visit (INDEPENDENT_AMBULATORY_CARE_PROVIDER_SITE_OTHER): Payer: BLUE CROSS/BLUE SHIELD | Admitting: Internal Medicine

## 2015-04-19 VITALS — BP 118/80 | HR 77 | Wt 348.0 lb

## 2015-04-19 DIAGNOSIS — R21 Rash and other nonspecific skin eruption: Secondary | ICD-10-CM | POA: Diagnosis not present

## 2015-04-19 DIAGNOSIS — I10 Essential (primary) hypertension: Secondary | ICD-10-CM | POA: Diagnosis not present

## 2015-04-19 DIAGNOSIS — E661 Drug-induced obesity: Secondary | ICD-10-CM | POA: Diagnosis not present

## 2015-04-19 DIAGNOSIS — I872 Venous insufficiency (chronic) (peripheral): Secondary | ICD-10-CM

## 2015-04-19 DIAGNOSIS — G8929 Other chronic pain: Secondary | ICD-10-CM

## 2015-04-19 DIAGNOSIS — E559 Vitamin D deficiency, unspecified: Secondary | ICD-10-CM

## 2015-04-19 DIAGNOSIS — M5440 Lumbago with sciatica, unspecified side: Secondary | ICD-10-CM

## 2015-04-19 MED ORDER — HYDROCODONE-ACETAMINOPHEN 5-325 MG PO TABS: 1.0000 | ORAL_TABLET | Freq: Four times a day (QID) | ORAL | Status: DC | PRN

## 2015-04-19 MED ORDER — ALPRAZOLAM 1 MG PO TABS: ORAL_TABLET | ORAL | Status: DC

## 2015-04-19 NOTE — Progress Notes (Signed)
Pre visit review using our clinic review tool, if applicable. No additional management support is needed unless otherwise documented below in the visit note. 

## 2015-04-19 NOTE — Assessment & Plan Note (Addendum)
Necrobiosis lipoica  Try vasculera Second opinion Dermatology

## 2015-04-19 NOTE — Assessment & Plan Note (Signed)
On Vit D 

## 2015-04-19 NOTE — Assessment & Plan Note (Signed)
Wt Readings from Last 3 Encounters:  04/19/15 348 lb (157.852 kg)  01/17/15 346 lb (156.945 kg)  10/14/14 332 lb (150.594 kg)

## 2015-04-19 NOTE — Assessment & Plan Note (Signed)
Stephanie Harper

## 2015-04-19 NOTE — Assessment & Plan Note (Signed)
BP Readings from Last 3 Encounters:  04/19/15 118/80  01/17/15 138/80  10/14/14 110/80

## 2015-04-19 NOTE — Assessment & Plan Note (Signed)
Norco prn  Potential benefits of a long term opioids use as well as potential risks (i.e. addiction risk, apnea etc) and complications (i.e. Somnolence, constipation and others) were explained to the patient and were aknowledged.

## 2015-04-19 NOTE — Progress Notes (Signed)
Subjective:  Patient ID: Stephanie Harper, female    DOB: Mar 11, 1970  Age: 45 y.o. MRN: [id number]  CC: No chief complaint on file.   HPI Stephanie Harper presents for LBP, rash, anxiety f/u  Outpatient Prescriptions Prior to Visit  Medication Sig Dispense Refill  . acyclovir (ZOVIRAX) 400 MG tablet Take 1 tablet (400 mg total) by mouth 3 (three) times daily. 21 tablet 3  . ALPRAZolam (XANAX) 1 MG tablet TAKE 1 TABLET BY MOUTH 3 TIMES A DAY AS NEEDED FOR ANXIETY OR SLEEP 90 tablet 3  . BIOTIN PO Take 10,000 capsules by mouth daily.    [name] buPROPion (WELLBUTRIN SR) 100 MG 12 hr tablet TAKE 1 TABLET TWICE A DAY 180 tablet 3  . Cholecalciferol 1000 UNITS tablet Take 2 tablets (2,000 Units total) by mouth daily. 100 tablet 3  . clobetasol cream (TEMOVATE) 0.05 % APPLY TO LEG UNDER MOIST COMPRESS AT BEDTIME (NOT FOR FACE OR FOLDS)  2  . Fluticasone Furoate-Vilanterol (BREO ELLIPTA) 100-25 MCG/INH AEPB Inhale 1 puff into the lungs daily. 180 each 2  . furosemide (LASIX) 40 MG tablet Take 1 tablet (40 mg total) by mouth daily as needed. 30 tablet 5  . HYDROcodone-acetaminophen (NORCO/VICODIN) 5-325 MG tablet Take 1 tablet by mouth every 6 (six) hours as needed for moderate pain or severe pain. Please fill on or after 03/26/15 120 tablet 0  . ibuprofen (ADVIL,MOTRIN) 600 MG tablet TAKE 1 TABLET (600 MG TOTAL) BY MOUTH 2 (TWO) TIMES DAILY AS NEEDED. 180 tablet 2  . Multiple Vitamin (MULTIVITAMIN) tablet Take 1 tablet by mouth 2 (two) times daily.     [name] PARoxetine (PAXIL) 10 MG tablet TAKE 1 TABLET EVERY DAY 90 tablet 3  . triamcinolone cream (KENALOG) 0.5 % Apply 1 application topically 3 (three) times daily. 45 g 3  . VENTOLIN HFA 108 (90 Base) MCG/ACT inhaler INHALE 2 PUFFS EVERY 6 HOURS AS NEEDED 18 Inhaler 0  . vitamin B-12 (CYANOCOBALAMIN) 1000 MCG tablet Take 1,000 mcg by mouth daily.     . Dietary Management Product (VASCULERA) TABS Take 1 tablet by mouth 2 (two) times daily. (Patient not  taking: Reported on 04/19/2015) 60 tablet 11   No facility-administered medications prior to visit.    ROS Review of Systems  Constitutional: Negative for chills, activity change, appetite change, fatigue and unexpected weight change.  HENT: Negative for congestion, mouth sores and sinus pressure.   Eyes: Negative for visual disturbance.  Respiratory: Negative for cough and chest tightness.   Gastrointestinal: Negative for nausea and abdominal pain.  Genitourinary: Negative for frequency, difficulty urinating and vaginal pain.  Musculoskeletal: Positive for back pain. Negative for gait problem.  Skin: Positive for rash. Negative for pallor.  Neurological: Negative for dizziness, tremors, weakness, numbness and headaches.  Psychiatric/Behavioral: Negative for suicidal ideas, confusion and sleep disturbance. The patient is nervous/anxious.     Objective:  BP 118/80 mmHg  Pulse 77  Wt 348 lb (157.852 kg)  SpO2 97%  BP Readings from Last 3 Encounters:  04/19/15 118/80  01/17/15 138/80  10/14/14 110/80    Wt Readings from Last 3 Encounters:  04/19/15 348 lb (157.852 kg)  01/17/15 346 lb (156.945 kg)  10/14/14 332 lb (150.594 kg)    Physical Exam  Constitutional: She appears well-developed. No distress.  HENT:  Head: Normocephalic.  Right Ear: External ear normal.  Left Ear: External ear normal.  Nose: Nose normal.  Mouth/Throat: Oropharynx is clear and moist.  Eyes:  Conjunctivae are normal. Pupils are equal, round, and reactive to light. Right eye exhibits no discharge. Left eye exhibits no discharge.  Neck: Normal range of motion. Neck supple. No JVD present. No tracheal deviation present. No thyromegaly present.  Cardiovascular: Normal rate, regular rhythm and normal heart sounds.   Pulmonary/Chest: No stridor. No respiratory distress. She has no wheezes.  Abdominal: Soft. Bowel sounds are normal. She exhibits no distension and no mass. There is no tenderness. There is no  rebound and no guarding.  Musculoskeletal: She exhibits edema and tenderness.  Lymphadenopathy:    She has no cervical adenopathy.  Neurological: She displays normal reflexes. No cranial nerve deficit. She exhibits normal muscle tone. Coordination normal.  Skin: Rash noted. No erythema.  Psychiatric: She has a normal mood and affect. Her behavior is normal. Judgment and thought content normal.  eryth patch of rash on L shin  Lab Results  Component Value Date   WBC 8.9 05/30/2014   HGB 15.1* 05/30/2014   HCT 44.3 05/30/2014   PLT 284.0 05/30/2014   GLUCOSE 104* 05/30/2014   CHOL 162 05/30/2014   TRIG 73.0 05/30/2014   HDL 42.10 05/30/2014   LDLCALC 105* 05/30/2014   ALT 12 05/30/2014   AST 13 05/30/2014   NA 139 05/30/2014   K 4.3 05/30/2014   CL 106 05/30/2014   CREATININE 0.61 05/30/2014   BUN 11 05/30/2014   CO2 27 05/30/2014   TSH 2.15 05/30/2014   INR 1.0 08/09/2007   HGBA1C 5.7 11/05/2007    Dg Abd 1 View - Kub  11/17/2014  CLINICAL DATA:  Difficulty with the lap band over the past year, frequent vomiting, fluid removed from lap band today. EXAM: ABDOMEN - 1 VIEW COMPARISON:  KUB of January 22, 2012. FINDINGS: The lap band device appears to be in appropriate position at the expected location of the GE junction. The reservoir and connecting tubing are unremarkable. There is gas within the stomach. The bowel gas pattern is unremarkable. The lung bases are clear. The bony structures are normal. IMPRESSION: No acute intra-abdominal abnormality is observed. The lap band device appears to be in appropriate position. Electronically Signed   By: David  Martinique M.D.   On: 11/17/2014 15:00    Assessment & Plan:   Diagnoses and all orders for this visit:  Rash and nonspecific skin eruption  Morbid drug-induced obesity  Essential hypertension  Venous insufficiency of both lower extremities  Chronic midline low back pain with sciatica, sciatica laterality  unspecified  Vitamin D deficiency  Other orders -     ALPRAZolam (XANAX) 1 MG tablet; TAKE 1 TABLET BY MOUTH 3 TIMES A DAY AS NEEDED FOR ANXIETY OR SLEEP -     HYDROcodone-acetaminophen (NORCO/VICODIN) 5-325 MG tablet; Take 1 tablet by mouth every 6 (six) hours as needed for moderate pain or severe pain. Please fill on or after 03/26/15   I am having Ms. Lien maintain her vitamin B-12, multivitamin, BIOTIN PO, triamcinolone cream, acyclovir, Cholecalciferol, clobetasol cream, VASCULERA, ALPRAZolam, HYDROcodone-acetaminophen, furosemide, fluticasone furoate-vilanterol, buPROPion, PARoxetine, VENTOLIN HFA, and ibuprofen.  No orders of the defined types were placed in this encounter.     Follow-up: No Follow-up on file.  Walker Kehr, MD

## 2015-05-01 ENCOUNTER — Encounter: Payer: Self-pay | Admitting: Internal Medicine

## 2015-07-19 ENCOUNTER — Encounter: Payer: Self-pay | Admitting: Internal Medicine

## 2015-07-19 ENCOUNTER — Ambulatory Visit (INDEPENDENT_AMBULATORY_CARE_PROVIDER_SITE_OTHER): Payer: BLUE CROSS/BLUE SHIELD | Admitting: Internal Medicine

## 2015-07-19 VITALS — BP 120/76 | HR 80 | Wt 350.0 lb

## 2015-07-19 DIAGNOSIS — F410 Panic disorder [episodic paroxysmal anxiety] without agoraphobia: Secondary | ICD-10-CM | POA: Diagnosis not present

## 2015-07-19 DIAGNOSIS — J4520 Mild intermittent asthma, uncomplicated: Secondary | ICD-10-CM | POA: Diagnosis not present

## 2015-07-19 DIAGNOSIS — R21 Rash and other nonspecific skin eruption: Secondary | ICD-10-CM | POA: Diagnosis not present

## 2015-07-19 DIAGNOSIS — F411 Generalized anxiety disorder: Secondary | ICD-10-CM

## 2015-07-19 MED ORDER — HYDROCODONE-ACETAMINOPHEN 5-325 MG PO TABS: 1.0000 | ORAL_TABLET | Freq: Four times a day (QID) | ORAL | Status: DC | PRN

## 2015-07-19 MED ORDER — ALPRAZOLAM 1 MG PO TABS: ORAL_TABLET | ORAL | Status: DC

## 2015-07-19 NOTE — Progress Notes (Signed)
Pre visit review using our clinic review tool, if applicable. No additional management support is needed unless otherwise documented below in the visit note. 

## 2015-07-19 NOTE — Assessment & Plan Note (Signed)
Xanax prn  Potential benefits of a long term benzodiazepines  use as well as potential risks  and complications were explained to the patient and were aknowledged.

## 2015-07-19 NOTE — Assessment & Plan Note (Signed)
Memory Dance, Proair

## 2015-07-19 NOTE — Progress Notes (Signed)
Subjective:  Patient ID: Stephanie Harper, female    DOB: 07/20/1970  Age: 45 y.o. MRN: [id number]  CC: No chief complaint on file.   HPI KELIYAH SPILLMAN presents for edema, LBP, depression w/anxiety, LLE rash f/u  Outpatient Prescriptions Prior to Visit  Medication Sig Dispense Refill  . acyclovir (ZOVIRAX) 400 MG tablet Take 1 tablet (400 mg total) by mouth 3 (three) times daily. 21 tablet 3  . BIOTIN PO Take 10,000 capsules by mouth daily.    [name] buPROPion (WELLBUTRIN SR) 100 MG 12 hr tablet TAKE 1 TABLET TWICE A DAY 180 tablet 3  . Cholecalciferol 1000 UNITS tablet Take 2 tablets (2,000 Units total) by mouth daily. 100 tablet 3  . clobetasol cream (TEMOVATE) 0.05 % APPLY TO LEG UNDER MOIST COMPRESS AT BEDTIME (NOT FOR FACE OR FOLDS)  2  . Dietary Management Product (VASCULERA) TABS Take 1 tablet by mouth 2 (two) times daily. 60 tablet 11  . Fluticasone Furoate-Vilanterol (BREO ELLIPTA) 100-25 MCG/INH AEPB Inhale 1 puff into the lungs daily. 180 each 2  . furosemide (LASIX) 40 MG tablet Take 1 tablet (40 mg total) by mouth daily as needed. 30 tablet 5  . ibuprofen (ADVIL,MOTRIN) 600 MG tablet TAKE 1 TABLET (600 MG TOTAL) BY MOUTH 2 (TWO) TIMES DAILY AS NEEDED. 180 tablet 2  . Multiple Vitamin (MULTIVITAMIN) tablet Take 1 tablet by mouth 2 (two) times daily.     [name] PARoxetine (PAXIL) 10 MG tablet TAKE 1 TABLET EVERY DAY 90 tablet 3  . triamcinolone cream (KENALOG) 0.5 % Apply 1 application topically 3 (three) times daily. 45 g 3  . VENTOLIN HFA 108 (90 Base) MCG/ACT inhaler INHALE 2 PUFFS EVERY 6 HOURS AS NEEDED 18 Inhaler 0  . vitamin B-12 (CYANOCOBALAMIN) 1000 MCG tablet Take 1,000 mcg by mouth daily.     [name] ALPRAZolam (XANAX) 1 MG tablet TAKE 1 TABLET BY MOUTH 3 TIMES A DAY AS NEEDED FOR ANXIETY OR SLEEP 90 tablet 3  . HYDROcodone-acetaminophen (NORCO/VICODIN) 5-325 MG tablet Take 1 tablet by mouth every 6 (six) hours as needed for moderate pain or severe pain. Please fill on or  after 06/26/15 120 tablet 0   No facility-administered medications prior to visit.    ROS Review of Systems  Constitutional: Positive for fatigue. Negative for chills, activity change, appetite change and unexpected weight change.  HENT: Negative for congestion, mouth sores and sinus pressure.   Eyes: Negative for visual disturbance.  Respiratory: Positive for shortness of breath and wheezing. Negative for cough and chest tightness.   Cardiovascular: Positive for leg swelling.  Gastrointestinal: Negative for nausea and abdominal pain.  Genitourinary: Negative for frequency, difficulty urinating and vaginal pain.  Musculoskeletal: Positive for arthralgias and gait problem. Negative for back pain.  Skin: Positive for rash. Negative for pallor.  Neurological: Negative for dizziness, tremors, weakness, numbness and headaches.  Psychiatric/Behavioral: Negative for confusion, sleep disturbance and decreased concentration.    Objective:  BP 120/76 mmHg  Pulse 80  Wt 350 lb (158.759 kg)  SpO2 97%  BP Readings from Last 3 Encounters:  07/19/15 120/76  04/19/15 118/80  01/17/15 138/80    Wt Readings from Last 3 Encounters:  07/19/15 350 lb (158.759 kg)  04/19/15 348 lb (157.852 kg)  01/17/15 346 lb (156.945 kg)    Physical Exam  Constitutional: She appears well-developed. No distress.  HENT:  Head: Normocephalic.  Right Ear: External ear normal.  Left Ear: External ear normal.  Nose: Nose normal.  Mouth/Throat: Oropharynx is clear and moist.  Eyes: Conjunctivae are normal. Pupils are equal, round, and reactive to light. Right eye exhibits no discharge. Left eye exhibits no discharge.  Neck: Normal range of motion. Neck supple. No JVD present. No tracheal deviation present. No thyromegaly present.  Cardiovascular: Normal rate, regular rhythm and normal heart sounds.   Pulmonary/Chest: No stridor. No respiratory distress. She has no wheezes.  Abdominal: Soft. Bowel sounds are  normal. She exhibits no distension and no mass. There is no tenderness. There is no rebound and no guarding.  Musculoskeletal: She exhibits no edema or tenderness.  Lymphadenopathy:    She has no cervical adenopathy.  Neurological: She displays normal reflexes. No cranial nerve deficit. She exhibits normal muscle tone. Coordination normal.  Skin: No rash noted. No erythema.  Psychiatric: She has a normal mood and affect. Her behavior is normal. Judgment and thought content normal.     LLE with distal erythema and pain  Lab Results  Component Value Date   WBC 8.9 05/30/2014   HGB 15.1* 05/30/2014   HCT 44.3 05/30/2014   PLT 284.0 05/30/2014   GLUCOSE 104* 05/30/2014   CHOL 162 05/30/2014   TRIG 73.0 05/30/2014   HDL 42.10 05/30/2014   LDLCALC 105* 05/30/2014   ALT 12 05/30/2014   AST 13 05/30/2014   NA 139 05/30/2014   K 4.3 05/30/2014   CL 106 05/30/2014   CREATININE 0.61 05/30/2014   BUN 11 05/30/2014   CO2 27 05/30/2014   TSH 2.15 05/30/2014   INR 1.0 08/09/2007   HGBA1C 5.7 11/05/2007    Dg Abd 1 View - Kub  11/17/2014  CLINICAL DATA:  Difficulty with the lap band over the past year, frequent vomiting, fluid removed from lap band today. EXAM: ABDOMEN - 1 VIEW COMPARISON:  KUB of January 22, 2012. FINDINGS: The lap band device appears to be in appropriate position at the expected location of the GE junction. The reservoir and connecting tubing are unremarkable. There is gas within the stomach. The bowel gas pattern is unremarkable. The lung bases are clear. The bony structures are normal. IMPRESSION: No acute intra-abdominal abnormality is observed. The lap band device appears to be in appropriate position. Electronically Signed   By: David  Martinique M.D.   On: 11/17/2014 15:00    Assessment & Plan:   There are no diagnoses linked to this encounter. I have changed Ms. Villanueva's HYDROcodone-acetaminophen. I am also having her maintain her vitamin B-12, multivitamin, BIOTIN  PO, triamcinolone cream, acyclovir, Cholecalciferol, clobetasol cream, VASCULERA, furosemide, fluticasone furoate-vilanterol, buPROPion, PARoxetine, VENTOLIN HFA, ibuprofen, and ALPRAZolam.  Meds ordered this encounter  Medications  . HYDROcodone-acetaminophen (NORCO/VICODIN) 5-325 MG tablet    Sig: Take 1 tablet by mouth every 6 (six) hours as needed for moderate pain or severe pain. Please fill on or after 07/26/15    Dispense:  120 tablet    Refill:  0  . ALPRAZolam (XANAX) 1 MG tablet    Sig: TAKE 1 TABLET BY MOUTH 3 TIMES A DAY AS NEEDED FOR ANXIETY OR SLEEP    Dispense:  90 tablet    Refill:  3     Follow-up: No Follow-up on file.  Walker Kehr, MD

## 2015-07-19 NOTE — Assessment & Plan Note (Signed)
Necrobiosis lipoica - s/p bx 2016 - Dr Vira Browns Appt at [facility] in November

## 2015-10-11 DIAGNOSIS — I8310 Varicose veins of unspecified lower extremity with inflammation: Secondary | ICD-10-CM | POA: Diagnosis not present

## 2015-10-18 ENCOUNTER — Ambulatory Visit (INDEPENDENT_AMBULATORY_CARE_PROVIDER_SITE_OTHER): Payer: BLUE CROSS/BLUE SHIELD | Admitting: Internal Medicine

## 2015-10-18 ENCOUNTER — Encounter: Payer: Self-pay | Admitting: Internal Medicine

## 2015-10-18 VITALS — BP 118/80 | HR 80 | Temp 98.9°F | Wt 349.0 lb

## 2015-10-18 DIAGNOSIS — J4520 Mild intermittent asthma, uncomplicated: Secondary | ICD-10-CM

## 2015-10-18 DIAGNOSIS — E559 Vitamin D deficiency, unspecified: Secondary | ICD-10-CM

## 2015-10-18 DIAGNOSIS — Z23 Encounter for immunization: Secondary | ICD-10-CM | POA: Diagnosis not present

## 2015-10-18 DIAGNOSIS — I872 Venous insufficiency (chronic) (peripheral): Secondary | ICD-10-CM | POA: Diagnosis not present

## 2015-10-18 DIAGNOSIS — F411 Generalized anxiety disorder: Secondary | ICD-10-CM

## 2015-10-18 DIAGNOSIS — Z0000 Encounter for general adult medical examination without abnormal findings: Secondary | ICD-10-CM

## 2015-10-18 MED ORDER — ALPRAZOLAM 1 MG PO TABS: ORAL_TABLET | ORAL | 3 refills | Status: DC

## 2015-10-18 MED ORDER — HYDROCODONE-ACETAMINOPHEN 5-325 MG PO TABS: 1.0000 | ORAL_TABLET | Freq: Four times a day (QID) | ORAL | 0 refills | Status: DC | PRN

## 2015-10-18 MED ORDER — CLOBETASOL PROPIONATE 0.05 % EX CREA: TOPICAL_CREAM | CUTANEOUS | 2 refills | Status: DC

## 2015-10-18 NOTE — Assessment & Plan Note (Signed)
On Vit D 

## 2015-10-18 NOTE — Assessment & Plan Note (Signed)
Breo

## 2015-10-18 NOTE — Assessment & Plan Note (Signed)
Wt Readings from Last 3 Encounters:  10/18/15 (!) 349 lb (158.3 kg)  07/19/15 (!) 350 lb (158.8 kg)  04/19/15 (!) 348 lb (157.9 kg)

## 2015-10-18 NOTE — Progress Notes (Signed)
Subjective:  Patient ID: Stephanie Harper, female    DOB: Jul 21, 1970  Age: 45 y.o. MRN: [id number]  CC: No chief complaint on file.   HPI Stephanie Harper presents for a rash on the L shin, asthma, depression, LBP f/u  Outpatient Medications Prior to Visit  Medication Sig Dispense Refill  . acyclovir (ZOVIRAX) 400 MG tablet Take 1 tablet (400 mg total) by mouth 3 (three) times daily. 21 tablet 3  . ALPRAZolam (XANAX) 1 MG tablet TAKE 1 TABLET BY MOUTH 3 TIMES A DAY AS NEEDED FOR ANXIETY OR SLEEP 90 tablet 3  . BIOTIN PO Take 10,000 capsules by mouth daily.    [name] buPROPion (WELLBUTRIN SR) 100 MG 12 hr tablet TAKE 1 TABLET TWICE A DAY 180 tablet 3  . Cholecalciferol 1000 UNITS tablet Take 2 tablets (2,000 Units total) by mouth daily. 100 tablet 3  . clobetasol cream (TEMOVATE) 0.05 % APPLY TO LEG UNDER MOIST COMPRESS AT BEDTIME (NOT FOR FACE OR FOLDS)  2  . Dietary Management Product (VASCULERA) TABS Take 1 tablet by mouth 2 (two) times daily. 60 tablet 11  . Fluticasone Furoate-Vilanterol (BREO ELLIPTA) 100-25 MCG/INH AEPB Inhale 1 puff into the lungs daily. 180 each 2  . furosemide (LASIX) 40 MG tablet Take 1 tablet (40 mg total) by mouth daily as needed. 30 tablet 5  . HYDROcodone-acetaminophen (NORCO/VICODIN) 5-325 MG tablet Take 1 tablet by mouth every 6 (six) hours as needed for moderate pain or severe pain. Please fill on or after 07/26/15 120 tablet 0  . ibuprofen (ADVIL,MOTRIN) 600 MG tablet TAKE 1 TABLET (600 MG TOTAL) BY MOUTH 2 (TWO) TIMES DAILY AS NEEDED. 180 tablet 2  . Multiple Vitamin (MULTIVITAMIN) tablet Take 1 tablet by mouth 2 (two) times daily.     [name] PARoxetine (PAXIL) 10 MG tablet TAKE 1 TABLET EVERY DAY 90 tablet 3  . triamcinolone cream (KENALOG) 0.5 % Apply 1 application topically 3 (three) times daily. 45 g 3  . VENTOLIN HFA 108 (90 Base) MCG/ACT inhaler INHALE 2 PUFFS EVERY 6 HOURS AS NEEDED 18 Inhaler 0  . vitamin B-12 (CYANOCOBALAMIN) 1000 MCG tablet Take 1,000  mcg by mouth daily.      No facility-administered medications prior to visit.     ROS Review of Systems  Constitutional: Negative for activity change, appetite change, chills, fatigue and unexpected weight change.  HENT: Negative for congestion, mouth sores and sinus pressure.   Eyes: Negative for visual disturbance.  Respiratory: Negative for cough and chest tightness.   Gastrointestinal: Negative for abdominal pain and nausea.  Genitourinary: Negative for difficulty urinating, frequency and vaginal pain.  Musculoskeletal: Positive for gait problem. Negative for back pain.  Skin: Positive for rash. Negative for pallor.  Neurological: Negative for dizziness, tremors, weakness, numbness and headaches.  Psychiatric/Behavioral: Negative for confusion, sleep disturbance and suicidal ideas. The patient is nervous/anxious.     Objective:  BP 118/80   Pulse 80   Temp 98.9 F (37.2 C) (Oral)   Wt (!) 349 lb (158.3 kg)   SpO2 97%   BMI 54.66 kg/m   BP Readings from Last 3 Encounters:  10/18/15 118/80  07/19/15 120/76  04/19/15 118/80    Wt Readings from Last 3 Encounters:  10/18/15 (!) 349 lb (158.3 kg)  07/19/15 (!) 350 lb (158.8 kg)  04/19/15 (!) 348 lb (157.9 kg)    Physical Exam  Constitutional: She appears well-developed. No distress.  HENT:  Head: Normocephalic.  Right Ear: External  ear normal.  Left Ear: External ear normal.  Nose: Nose normal.  Mouth/Throat: Oropharynx is clear and moist.  Eyes: Conjunctivae are normal. Pupils are equal, round, and reactive to light. Right eye exhibits no discharge. Left eye exhibits no discharge.  Neck: Normal range of motion. Neck supple. No JVD present. No tracheal deviation present. No thyromegaly present.  Cardiovascular: Normal rate, regular rhythm and normal heart sounds.   Pulmonary/Chest: No stridor. No respiratory distress. She has no wheezes.  Abdominal: Soft. Bowel sounds are normal. She exhibits no distension and no  mass. There is no tenderness. There is no rebound and no guarding.  Musculoskeletal: She exhibits edema and tenderness.  Lymphadenopathy:    She has no cervical adenopathy.  Neurological: She displays normal reflexes. No cranial nerve deficit. She exhibits normal muscle tone. Coordination normal.  Skin: Rash noted. There is erythema.  Psychiatric: She has a normal mood and affect. Her behavior is normal. Judgment and thought content normal.  L shin w/rash  Lab Results  Component Value Date   WBC 8.9 05/30/2014   HGB 15.1 (H) 05/30/2014   HCT 44.3 05/30/2014   PLT 284.0 05/30/2014   GLUCOSE 104 (H) 05/30/2014   CHOL 162 05/30/2014   TRIG 73.0 05/30/2014   HDL 42.10 05/30/2014   LDLCALC 105 (H) 05/30/2014   ALT 12 05/30/2014   AST 13 05/30/2014   NA 139 05/30/2014   K 4.3 05/30/2014   CL 106 05/30/2014   CREATININE 0.61 05/30/2014   BUN 11 05/30/2014   CO2 27 05/30/2014   TSH 2.15 05/30/2014   INR 1.0 08/09/2007   HGBA1C 5.7 11/05/2007    Dg Abd 1 View - Kub  Result Date: 11/17/2014 CLINICAL DATA:  Difficulty with the lap band over the past year, frequent vomiting, fluid removed from lap band today. EXAM: ABDOMEN - 1 VIEW COMPARISON:  KUB of January 22, 2012. FINDINGS: The lap band device appears to be in appropriate position at the expected location of the GE junction. The reservoir and connecting tubing are unremarkable. There is gas within the stomach. The bowel gas pattern is unremarkable. The lung bases are clear. The bony structures are normal. IMPRESSION: No acute intra-abdominal abnormality is observed. The lap band device appears to be in appropriate position. Electronically Signed   By: David  Martinique M.D.   On: 11/17/2014 15:00    Assessment & Plan:   Diagnoses and all orders for this visit:  Need for influenza vaccination -     Flu Vaccine QUAD 36+ mos IM  Other orders -     HYDROcodone-acetaminophen (NORCO/VICODIN) 5-325 MG tablet; Take 1 tablet by mouth every  6 (six) hours as needed for moderate pain or severe pain. Please fill on or after 07/26/15 -     ALPRAZolam (XANAX) 1 MG tablet; TAKE 1 TABLET BY MOUTH 3 TIMES A DAY AS NEEDED FOR ANXIETY OR SLEEP   I am having Ms. Burbach maintain her vitamin B-12, multivitamin, BIOTIN PO, triamcinolone cream, acyclovir, Cholecalciferol, clobetasol cream, VASCULERA, furosemide, fluticasone furoate-vilanterol, buPROPion, PARoxetine, VENTOLIN HFA, ibuprofen, HYDROcodone-acetaminophen, ALPRAZolam, and pentoxifylline.  Meds ordered this encounter  Medications  . pentoxifylline (TRENTAL) 400 MG CR tablet    Sig: Take 1 tablet by mouth 2 (two) times daily.     Follow-up: No Follow-up on file.  Walker Kehr, MD

## 2015-10-18 NOTE — Progress Notes (Signed)
Pre visit review using our clinic review tool, if applicable. No additional management support is needed unless otherwise documented below in the visit note. 

## 2015-10-18 NOTE — Assessment & Plan Note (Signed)
Xanax prn  Potential benefits of a long term benzodiazepines  use as well as potential risks  and complications were explained to the patient and were aknowledged.

## 2015-11-17 DIAGNOSIS — R1310 Dysphagia, unspecified: Secondary | ICD-10-CM | POA: Diagnosis not present

## 2015-11-22 DIAGNOSIS — L921 Necrobiosis lipoidica, not elsewhere classified: Secondary | ICD-10-CM | POA: Diagnosis not present

## 2015-11-22 DIAGNOSIS — I872 Venous insufficiency (chronic) (peripheral): Secondary | ICD-10-CM | POA: Diagnosis not present

## 2015-11-24 DIAGNOSIS — L921 Necrobiosis lipoidica, not elsewhere classified: Secondary | ICD-10-CM | POA: Diagnosis not present

## 2015-12-22 ENCOUNTER — Other Ambulatory Visit (HOSPITAL_COMMUNITY): Payer: Self-pay | Admitting: Surgery

## 2015-12-22 DIAGNOSIS — R1310 Dysphagia, unspecified: Secondary | ICD-10-CM

## 2015-12-22 DIAGNOSIS — K9509 Other complications of gastric band procedure: Secondary | ICD-10-CM | POA: Diagnosis not present

## 2015-12-27 ENCOUNTER — Other Ambulatory Visit (HOSPITAL_COMMUNITY): Payer: Self-pay | Admitting: Surgery

## 2015-12-27 ENCOUNTER — Ambulatory Visit (HOSPITAL_COMMUNITY)
Admission: RE | Admit: 2015-12-27 | Discharge: 2015-12-27 | Disposition: A | Discharge status: Home / Self Care | Payer: BLUE CROSS/BLUE SHIELD | Source: Ambulatory Visit | Attending: Surgery | Admitting: Surgery

## 2015-12-27 DIAGNOSIS — R1310 Dysphagia, unspecified: Secondary | ICD-10-CM | POA: Diagnosis not present

## 2015-12-27 DIAGNOSIS — Z9884 Bariatric surgery status: Secondary | ICD-10-CM | POA: Diagnosis not present

## 2016-01-16 ENCOUNTER — Other Ambulatory Visit: Payer: Self-pay | Admitting: Internal Medicine

## 2016-01-18 NOTE — Telephone Encounter (Signed)
Called refill into [facility] spoke w/Meredith gave MD approval.../lmb

## 2016-01-19 ENCOUNTER — Ambulatory Visit (INDEPENDENT_AMBULATORY_CARE_PROVIDER_SITE_OTHER)
Admission: RE | Admit: 2016-01-19 | Discharge: 2016-01-19 | Disposition: A | Discharge status: Home / Self Care | Payer: BLUE CROSS/BLUE SHIELD | Source: Ambulatory Visit | Attending: Internal Medicine | Admitting: Internal Medicine

## 2016-01-19 ENCOUNTER — Other Ambulatory Visit (INDEPENDENT_AMBULATORY_CARE_PROVIDER_SITE_OTHER): Payer: BLUE CROSS/BLUE SHIELD

## 2016-01-19 ENCOUNTER — Encounter: Payer: Self-pay | Admitting: Internal Medicine

## 2016-01-19 ENCOUNTER — Ambulatory Visit (INDEPENDENT_AMBULATORY_CARE_PROVIDER_SITE_OTHER): Payer: BLUE CROSS/BLUE SHIELD | Admitting: Internal Medicine

## 2016-01-19 VITALS — BP 124/80 | HR 76 | Wt 360.0 lb

## 2016-01-19 DIAGNOSIS — J069 Acute upper respiratory infection, unspecified: Secondary | ICD-10-CM | POA: Diagnosis not present

## 2016-01-19 DIAGNOSIS — F329 Major depressive disorder, single episode, unspecified: Secondary | ICD-10-CM

## 2016-01-19 DIAGNOSIS — Z0000 Encounter for general adult medical examination without abnormal findings: Secondary | ICD-10-CM | POA: Diagnosis not present

## 2016-01-19 DIAGNOSIS — G8929 Other chronic pain: Secondary | ICD-10-CM

## 2016-01-19 DIAGNOSIS — M2550 Pain in unspecified joint: Secondary | ICD-10-CM

## 2016-01-19 DIAGNOSIS — J4520 Mild intermittent asthma, uncomplicated: Secondary | ICD-10-CM | POA: Diagnosis not present

## 2016-01-19 DIAGNOSIS — R059 Cough, unspecified: Secondary | ICD-10-CM

## 2016-01-19 DIAGNOSIS — R05 Cough: Secondary | ICD-10-CM

## 2016-01-19 DIAGNOSIS — Z0001 Encounter for general adult medical examination with abnormal findings: Secondary | ICD-10-CM | POA: Diagnosis not present

## 2016-01-19 DIAGNOSIS — M5440 Lumbago with sciatica, unspecified side: Secondary | ICD-10-CM

## 2016-01-19 DIAGNOSIS — E559 Vitamin D deficiency, unspecified: Secondary | ICD-10-CM

## 2016-01-19 DIAGNOSIS — I10 Essential (primary) hypertension: Secondary | ICD-10-CM

## 2016-01-19 LAB — CBC WITH DIFFERENTIAL/PLATELET
BASOS ABS: 0.1 10*3/uL (ref 0.0–0.1)
BASOS PCT: 0.5 % (ref 0.0–3.0)
EOS ABS: 0.1 10*3/uL (ref 0.0–0.7)
Eosinophils Relative: 1.4 % (ref 0.0–5.0)
HEMATOCRIT: 41.1 % (ref 36.0–46.0)
HEMOGLOBIN: 13.9 g/dL (ref 12.0–15.0)
LYMPHS PCT: 18.5 % (ref 12.0–46.0)
Lymphs Abs: 2 10*3/uL (ref 0.7–4.0)
MCHC: 33.8 g/dL (ref 30.0–36.0)
MCV: 88.1 fl (ref 78.0–100.0)
MONO ABS: 0.8 10*3/uL (ref 0.1–1.0)
Monocytes Relative: 7 % (ref 3.0–12.0)
Neutro Abs: 7.8 10*3/uL — ABNORMAL HIGH (ref 1.4–7.7)
Neutrophils Relative %: 72.6 % (ref 43.0–77.0)
PLATELETS: 303 10*3/uL (ref 150.0–400.0)
RBC: 4.66 Mil/uL (ref 3.87–5.11)
RDW: 13.8 % (ref 11.5–15.5)
WBC: 10.7 10*3/uL — AB (ref 4.0–10.5)

## 2016-01-19 LAB — VITAMIN B12: VITAMIN B 12: 949 pg/mL — AB (ref 211–911)

## 2016-01-19 LAB — BASIC METABOLIC PANEL
BUN: 13 mg/dL (ref 6–23)
CALCIUM: 9.3 mg/dL (ref 8.4–10.5)
CHLORIDE: 105 meq/L (ref 96–112)
CO2: 31 mEq/L (ref 19–32)
CREATININE: 0.6 mg/dL (ref 0.40–1.20)
GFR: 114.98 mL/min (ref >60.00)
Glucose, Bld: 94 mg/dL (ref 70–99)
Potassium: 4.4 mEq/L (ref 3.5–5.1)
Sodium: 140 mEq/L (ref 135–145)

## 2016-01-19 LAB — LIPID PANEL
CHOLESTEROL: 169 mg/dL (ref 0–200)
HDL: 44.7 mg/dL (ref >39.00)
LDL Cholesterol: 110 mg/dL — ABNORMAL HIGH (ref 0–99)
NonHDL: 123.9
TRIGLYCERIDES: 68 mg/dL (ref 0.0–149.0)
Total CHOL/HDL Ratio: 4
VLDL: 13.6 mg/dL (ref 0.0–40.0)

## 2016-01-19 LAB — HEPATIC FUNCTION PANEL
ALK PHOS: 84 U/L (ref 39–117)
ALT: 15 U/L (ref 0–35)
AST: 14 U/L (ref 0–37)
Albumin: 3.7 g/dL (ref 3.5–5.2)
BILIRUBIN DIRECT: 0.1 mg/dL (ref 0.0–0.3)
BILIRUBIN TOTAL: 0.5 mg/dL (ref 0.2–1.2)
Total Protein: 7 g/dL (ref 6.0–8.3)

## 2016-01-19 LAB — HEMOGLOBIN A1C: HEMOGLOBIN A1C: 5.7 % (ref 4.6–6.5)

## 2016-01-19 LAB — TSH: TSH: 2.6 u[IU]/mL (ref 0.35–4.50)

## 2016-01-19 LAB — VITAMIN D 25 HYDROXY (VIT D DEFICIENCY, FRACTURES): VITD: 27.97 ng/mL — ABNORMAL LOW (ref 30.00–100.00)

## 2016-01-19 MED ORDER — ALPRAZOLAM 1 MG PO TABS
ORAL_TABLET | ORAL | 2 refills | Status: DC
Start: 2016-01-19 — End: 2016-04-23

## 2016-01-19 MED ORDER — ALBUTEROL SULFATE HFA 108 (90 BASE) MCG/ACT IN AERS: 2.0000 | INHALATION_SPRAY | Freq: Four times a day (QID) | RESPIRATORY_TRACT | 5 refills | Status: DC | PRN

## 2016-01-19 MED ORDER — HYDROCODONE-ACETAMINOPHEN 5-325 MG PO TABS: 1.0000 | ORAL_TABLET | Freq: Four times a day (QID) | ORAL | 0 refills | Status: DC | PRN

## 2016-01-19 MED ORDER — CEFDINIR 300 MG PO CAPS: 300.0000 mg | ORAL_CAPSULE | Freq: Two times a day (BID) | ORAL | 0 refills | Status: DC

## 2016-01-19 NOTE — Progress Notes (Signed)
Pre visit review using our clinic review tool, if applicable. No additional management support is needed unless otherwise documented below in the visit note. 

## 2016-01-19 NOTE — Assessment & Plan Note (Signed)
Wt Readings from Last 3 Encounters:  01/19/16 (!) 360 lb (163.3 kg)  10/18/15 (!) 349 lb (158.3 kg)  07/19/15 (!) 350 lb (158.8 kg)

## 2016-01-19 NOTE — Progress Notes (Signed)
Subjective:  Patient ID: Stephanie Harper, female    DOB: April 14, 1970  Age: 46 y.o. MRN: [id number]  CC: Annual Exam   HPI Stephanie Harper presents for a well exam C/o URI sx's x 2 weeks  Outpatient Medications Prior to Visit  Medication Sig Dispense Refill  . acyclovir (ZOVIRAX) 400 MG tablet Take 1 tablet (400 mg total) by mouth 3 (three) times daily. 21 tablet 3  . ALPRAZolam (XANAX) 1 MG tablet TAKE 1 TABLET 3 TIMES A DAY AS NEEDED FOR ANXIETY OR SLEEP 90 tablet 2  . BIOTIN PO Take 10,000 capsules by mouth daily.    [name] buPROPion (WELLBUTRIN SR) 100 MG 12 hr tablet TAKE 1 TABLET TWICE A DAY 180 tablet 3  . Cholecalciferol 1000 UNITS tablet Take 2 tablets (2,000 Units total) by mouth daily. 100 tablet 3  . clobetasol cream (TEMOVATE) 0.05 % APPLY TO LEG UNDER MOIST COMPRESS AT BEDTIME (NOT FOR FACE OR FOLDS) 60 g 2  . Dietary Management Product (VASCULERA) TABS Take 1 tablet by mouth 2 (two) times daily. 60 tablet 11  . Fluticasone Furoate-Vilanterol (BREO ELLIPTA) 100-25 MCG/INH AEPB Inhale 1 puff into the lungs daily. 180 each 2  . furosemide (LASIX) 40 MG tablet Take 1 tablet (40 mg total) by mouth daily as needed. 30 tablet 5  . HYDROcodone-acetaminophen (NORCO/VICODIN) 5-325 MG tablet Take 1 tablet by mouth every 6 (six) hours as needed for moderate pain or severe pain. Please fill on or after 12/26/15 120 tablet 0  . ibuprofen (ADVIL,MOTRIN) 600 MG tablet TAKE 1 TABLET (600 MG TOTAL) BY MOUTH 2 (TWO) TIMES DAILY AS NEEDED. 180 tablet 2  . Multiple Vitamin (MULTIVITAMIN) tablet Take 1 tablet by mouth 2 (two) times daily.     [name] PARoxetine (PAXIL) 10 MG tablet TAKE 1 TABLET EVERY DAY 90 tablet 3  . pentoxifylline (TRENTAL) 400 MG CR tablet Take 1 tablet by mouth 2 (two) times daily.    [name] triamcinolone cream (KENALOG) 0.5 % Apply 1 application topically 3 (three) times daily. 45 g 3  . VENTOLIN HFA 108 (90 Base) MCG/ACT inhaler INHALE 2 PUFFS EVERY 6 HOURS AS NEEDED 18 Inhaler 0    . vitamin B-12 (CYANOCOBALAMIN) 1000 MCG tablet Take 1,000 mcg by mouth daily.      No facility-administered medications prior to visit.     ROS Review of Systems  Constitutional: Negative for activity change, appetite change, chills, fatigue and unexpected weight change.  HENT: Negative for congestion, mouth sores and sinus pressure.   Eyes: Negative for visual disturbance.  Respiratory: Negative for cough and chest tightness.   Gastrointestinal: Negative for abdominal pain and nausea.  Genitourinary: Negative for difficulty urinating, frequency and vaginal pain.  Musculoskeletal: Positive for back pain. Negative for gait problem.  Skin: Negative for pallor and rash.  Neurological: Negative for dizziness, tremors, weakness, numbness and headaches.  Psychiatric/Behavioral: Negative for confusion, sleep disturbance and suicidal ideas.    Objective:  BP 124/80   Pulse 76   Wt (!) 360 lb (163.3 kg)   SpO2 97%   BMI 56.38 kg/m   BP Readings from Last 3 Encounters:  01/19/16 124/80  10/18/15 118/80  07/19/15 120/76    Wt Readings from Last 3 Encounters:  01/19/16 (!) 360 lb (163.3 kg)  10/18/15 (!) 349 lb (158.3 kg)  07/19/15 (!) 350 lb (158.8 kg)    Physical Exam  Constitutional: She appears well-developed. No distress.  HENT:  Head: Normocephalic.  Right Ear:  External ear normal.  Left Ear: External ear normal.  Nose: Nose normal.  Mouth/Throat: Oropharynx is clear and moist.  Eyes: Conjunctivae are normal. Pupils are equal, round, and reactive to light. Right eye exhibits no discharge. Left eye exhibits no discharge.  Neck: Normal range of motion. Neck supple. No JVD present. No tracheal deviation present. No thyromegaly present.  Cardiovascular: Normal rate, regular rhythm and normal heart sounds.   Pulmonary/Chest: No stridor. No respiratory distress. She has no wheezes.  Abdominal: Soft. Bowel sounds are normal. She exhibits no distension and no mass. There is no  tenderness. There is no rebound and no guarding.  Musculoskeletal: She exhibits tenderness. She exhibits no edema.  Lymphadenopathy:    She has no cervical adenopathy.  Neurological: She displays normal reflexes. No cranial nerve deficit. She exhibits normal muscle tone. Coordination normal.  Skin: No rash noted. No erythema.  Psychiatric: She has a normal mood and affect. Her behavior is normal. Judgment and thought content normal.  Rash on LLE eryth nares LS tender Mild rhonchi  Lab Results  Component Value Date   WBC 8.9 05/30/2014   HGB 15.1 (H) 05/30/2014   HCT 44.3 05/30/2014   PLT 284.0 05/30/2014   GLUCOSE 104 (H) 05/30/2014   CHOL 162 05/30/2014   TRIG 73.0 05/30/2014   HDL 42.10 05/30/2014   LDLCALC 105 (H) 05/30/2014   ALT 12 05/30/2014   AST 13 05/30/2014   NA 139 05/30/2014   K 4.3 05/30/2014   CL 106 05/30/2014   CREATININE 0.61 05/30/2014   BUN 11 05/30/2014   CO2 27 05/30/2014   TSH 2.15 05/30/2014   INR 1.0 08/09/2007   HGBA1C 5.7 11/05/2007    Dg Ugi  W/kub  Result Date: 12/27/2015 CLINICAL DATA:  Dysphagia, unable to burp, prior laparoscopic band surgery in 2014 with removal of fluid from the band last year EXAM: UPPER GI SERIES WITH KUB TECHNIQUE: After obtaining a scout radiograph a routine upper GI series was performed using thin barium. FLUOROSCOPY TIME:  Fluoroscopy Time:  1 minutes 48 seconds Radiation Exposure Index (if provided by the fluoroscopic device): 75.8 mGy Number of Acquired Spot Images: 2 plus multiple screen captures during fluoroscopy COMPARISON:  Abdominal radiograph 11/17/2014 FINDINGS: Scout image demonstrates stable position of laparoscopic band since the previous exam, oriented from 1:00 to 7:00 edge-on at the proximal stomach. Reservoir RIGHT of midline in the upper abdomen. Normal esophageal distention and motility. No esophageal mass or stricture. Free passage of contrast across laparoscopic band into the stomach. A small collapsed  gastric pouch/reservoir is identified above the band but this does not extend during the study since contrast freely passes into the stomach without delay. Remainder of stomach distends normally with normal rugal fold thickness. No fold thickening, mass or ulceration identified. No evidence of gastric outlet obstruction. Ligament of Treitz normal position. Visualize jejunal loops normal appearance. IMPRESSION: Widely patent laparoscopic gastric band at the proximal stomach with stable position of the band since prior abdominal radiograph of 11/17/2014. No evidence of obstruction or distention of the gastric pouch is identified. Electronically Signed   By: Lavonia Dana M.D.   On: 12/27/2015 10:24    Assessment & Plan:   There are no diagnoses linked to this encounter. I am having Ms. Spain maintain her vitamin B-12, multivitamin, BIOTIN PO, triamcinolone cream, acyclovir, Cholecalciferol, VASCULERA, furosemide, fluticasone furoate-vilanterol, buPROPion, PARoxetine, VENTOLIN HFA, ibuprofen, pentoxifylline, HYDROcodone-acetaminophen, clobetasol cream, and ALPRAZolam.  No orders of the defined types were placed in  this encounter.    Follow-up: No Follow-up on file.  Walker Kehr, MD

## 2016-01-19 NOTE — Assessment & Plan Note (Signed)
NAS diet 

## 2016-01-19 NOTE — Assessment & Plan Note (Addendum)
On Wellbutrin, Paxil

## 2016-01-19 NOTE — Assessment & Plan Note (Signed)
We discussed age appropriate health related issues, including available/recomended screening tests and vaccinations. We discussed a need for adhering to healthy diet and exercise. Labs/EKG were reviewed/ordered. All questions were answered.  Labs

## 2016-01-19 NOTE — Assessment & Plan Note (Signed)
CXR Cefdinir x 10 d

## 2016-01-19 NOTE — Assessment & Plan Note (Signed)
Norco prn  Potential benefits of a long term opioids use as well as potential risks (i.e. addiction risk, apnea etc) and complications (i.e. Somnolence, constipation and others) were explained to the patient and were aknowledged.

## 2016-01-22 ENCOUNTER — Encounter: Payer: Self-pay | Admitting: Internal Medicine

## 2016-01-22 MED ORDER — ERGOCALCIFEROL 1.25 MG (50000 UT) PO CAPS: 50000.0000 [IU] | ORAL_CAPSULE | ORAL | 0 refills | Status: DC

## 2016-01-25 LAB — VITAMIN B1: VITAMIN B1 (THIAMINE): 33 nmol/L — AB (ref 8–30)

## 2016-01-26 ENCOUNTER — Other Ambulatory Visit: Payer: Self-pay | Admitting: *Deleted

## 2016-01-26 ENCOUNTER — Other Ambulatory Visit: Payer: Self-pay | Admitting: Internal Medicine

## 2016-01-26 MED ORDER — PHENTERMINE HCL 37.5 MG PO TABS: 37.5000 mg | ORAL_TABLET | Freq: Every day | ORAL | 0 refills | Status: DC

## 2016-02-13 ENCOUNTER — Other Ambulatory Visit: Payer: Self-pay | Admitting: Internal Medicine

## 2016-03-04 ENCOUNTER — Other Ambulatory Visit: Payer: Self-pay | Admitting: Internal Medicine

## 2016-04-01 ENCOUNTER — Other Ambulatory Visit: Payer: Self-pay | Admitting: Internal Medicine

## 2016-04-23 ENCOUNTER — Ambulatory Visit (INDEPENDENT_AMBULATORY_CARE_PROVIDER_SITE_OTHER): Payer: BLUE CROSS/BLUE SHIELD | Admitting: Internal Medicine

## 2016-04-23 ENCOUNTER — Encounter: Payer: Self-pay | Admitting: Internal Medicine

## 2016-04-23 DIAGNOSIS — F410 Panic disorder [episodic paroxysmal anxiety] without agoraphobia: Secondary | ICD-10-CM | POA: Diagnosis not present

## 2016-04-23 DIAGNOSIS — E559 Vitamin D deficiency, unspecified: Secondary | ICD-10-CM

## 2016-04-23 MED ORDER — HYDROCODONE-ACETAMINOPHEN 5-325 MG PO TABS: 1.0000 | ORAL_TABLET | Freq: Four times a day (QID) | ORAL | 0 refills | Status: DC | PRN

## 2016-04-23 MED ORDER — ALPRAZOLAM 1 MG PO TABS: ORAL_TABLET | ORAL | 2 refills | Status: DC

## 2016-04-23 NOTE — Assessment & Plan Note (Signed)
On Vit D 

## 2016-04-23 NOTE — Progress Notes (Signed)
Subjective:  Patient ID: Stephanie Harper, female    DOB: January 12, 1970  Age: 46 y.o. MRN: [id number]  CC: No chief complaint on file.   HPI Stephanie Harper presents for LBP, anxiety, depression f/u  Outpatient Medications Prior to Visit  Medication Sig Dispense Refill  . acyclovir (ZOVIRAX) 400 MG tablet Take 1 tablet (400 mg total) by mouth 3 (three) times daily. 21 tablet 3  . albuterol (VENTOLIN HFA) 108 (90 Base) MCG/ACT inhaler Inhale 2 puffs into the lungs every 6 (six) hours as needed. 1 Inhaler 5  . ALPRAZolam (XANAX) 1 MG tablet TAKE 1 TABLET 3 TIMES A DAY AS NEEDED FOR ANXIETY OR SLEEP 90 tablet 2  . BIOTIN PO Take 10,000 capsules by mouth daily.    [name] buPROPion (WELLBUTRIN SR) 100 MG 12 hr tablet TAKE 1 TABLET TWICE A DAY 180 tablet 3  . cefdinir (OMNICEF) 300 MG capsule Take 1 capsule (300 mg total) by mouth 2 (two) times daily. 20 capsule 0  . Cholecalciferol 1000 UNITS tablet Take 2 tablets (2,000 Units total) by mouth daily. 100 tablet 3  . clobetasol cream (TEMOVATE) 0.05 % APPLY TO LEG UNDER MOIST COMPRESS AT BEDTIME (NOT FOR FACE OR FOLDS) 60 g 2  . Fluticasone Furoate-Vilanterol (BREO ELLIPTA) 100-25 MCG/INH AEPB Inhale 1 puff into the lungs daily. 180 each 2  . furosemide (LASIX) 40 MG tablet Take 1 tablet (40 mg total) by mouth daily as needed. 30 tablet 5  . HYDROcodone-acetaminophen (NORCO/VICODIN) 5-325 MG tablet Take 1 tablet by mouth every 6 (six) hours as needed for moderate pain or severe pain. Please fill on or after 03/25/16 120 tablet 0  . ibuprofen (ADVIL,MOTRIN) 600 MG tablet TAKE 1 TABLET (600 MG TOTAL) BY MOUTH 2 (TWO) TIMES DAILY AS NEEDED. 180 tablet 3  . Multiple Vitamin (MULTIVITAMIN) tablet Take 1 tablet by mouth 2 (two) times daily.     [name] PARoxetine (PAXIL) 10 MG tablet TAKE 1 TABLET EVERY DAY 90 tablet 3  . pentoxifylline (TRENTAL) 400 MG CR tablet Take 1 tablet by mouth 2 (two) times daily.    . vitamin B-12 (CYANOCOBALAMIN) 1000 MCG tablet Take  1,000 mcg by mouth daily.     . Vitamin D, Ergocalciferol, (DRISDOL) 50000 units CAPS capsule TAKE ONE CAPSULE ONCE A WEEK 6 capsule 3  . phentermine (ADIPEX-P) 37.5 MG tablet Take 1 tablet (37.5 mg total) by mouth daily before breakfast. 30 tablet 0  . Dietary Management Product (VASCULERA) TABS Take 1 tablet by mouth 2 (two) times daily. 60 tablet 11  . triamcinolone cream (KENALOG) 0.5 % Apply 1 application topically 3 (three) times daily. 45 g 3   No facility-administered medications prior to visit.     ROS Review of Systems  Constitutional: Negative for activity change, appetite change, chills, fatigue and unexpected weight change.  HENT: Negative for congestion, mouth sores and sinus pressure.   Eyes: Negative for visual disturbance.  Respiratory: Negative for cough and chest tightness.   Gastrointestinal: Negative for abdominal pain and nausea.  Genitourinary: Negative for difficulty urinating, frequency and vaginal pain.  Musculoskeletal: Negative for back pain and gait problem.  Skin: Negative for pallor and rash.  Neurological: Negative for dizziness, tremors, weakness, numbness and headaches.  Psychiatric/Behavioral: Negative for confusion, sleep disturbance and suicidal ideas.    Objective:  BP 126/82 (BP Location: Left Arm, Patient Position: Sitting, Cuff Size: Large)   Pulse 70   Temp 98.2 F (36.8 C) (Oral)   Ht  5' 7"  (1.702 m)   Wt (!) 370 lb (167.8 kg)   SpO2 99%   BMI 57.95 kg/m   BP Readings from Last 3 Encounters:  04/23/16 126/82  01/19/16 124/80  10/18/15 118/80    Wt Readings from Last 3 Encounters:  04/23/16 (!) 370 lb (167.8 kg)  01/19/16 (!) 360 lb (163.3 kg)  10/18/15 (!) 349 lb (158.3 kg)    Physical Exam  Constitutional: She appears well-developed. No distress.  HENT:  Head: Normocephalic.  Right Ear: External ear normal.  Left Ear: External ear normal.  Nose: Nose normal.  Mouth/Throat: Oropharynx is clear and moist.  Eyes:  Conjunctivae are normal. Pupils are equal, round, and reactive to light. Right eye exhibits no discharge. Left eye exhibits no discharge.  Neck: Normal range of motion. Neck supple. No JVD present. No tracheal deviation present. No thyromegaly present.  Cardiovascular: Normal rate, regular rhythm and normal heart sounds.   Pulmonary/Chest: No stridor. No respiratory distress. She has no wheezes.  Abdominal: Soft. Bowel sounds are normal. She exhibits no distension and no mass. There is no tenderness. There is no rebound and no guarding.  Musculoskeletal: She exhibits tenderness. She exhibits no edema.  Lymphadenopathy:    She has no cervical adenopathy.  Neurological: She displays normal reflexes. No cranial nerve deficit. She exhibits normal muscle tone. Coordination normal.  Skin: No rash noted. No erythema.  Psychiatric: She has a normal mood and affect. Her behavior is normal. Judgment and thought content normal.    Lab Results  Component Value Date   WBC 10.7 (H) 01/19/2016   HGB 13.9 01/19/2016   HCT 41.1 01/19/2016   PLT 303.0 01/19/2016   GLUCOSE 94 01/19/2016   CHOL 169 01/19/2016   TRIG 68.0 01/19/2016   HDL 44.70 01/19/2016   LDLCALC 110 (H) 01/19/2016   ALT 15 01/19/2016   AST 14 01/19/2016   NA 140 01/19/2016   K 4.4 01/19/2016   CL 105 01/19/2016   CREATININE 0.60 01/19/2016   BUN 13 01/19/2016   CO2 31 01/19/2016   TSH 2.60 01/19/2016   INR 1.0 08/09/2007   HGBA1C 5.7 01/19/2016    Dg Chest 2 View  Result Date: 01/19/2016 CLINICAL DATA:  Productive cough . EXAM: CHEST  2 VIEW COMPARISON:  06/26/2011 . FINDINGS: Mediastinum and hilar structures normal. Cardiomegaly with normal pulmonary vascularity. No pleural effusion or pneumothorax. Postsurgical changes both shoulders. IMPRESSION: No acute cardiopulmonary disease. Electronically Signed   By: Marcello Moores  Register   On: 01/19/2016 14:42    Assessment & Plan:   There are no diagnoses linked to this encounter. I  have discontinued Ms. Monrreal's triamcinolone cream and VASCULERA. I am also having her maintain her vitamin B-12, multivitamin, BIOTIN PO, acyclovir, Cholecalciferol, furosemide, fluticasone furoate-vilanterol, pentoxifylline, clobetasol cream, ALPRAZolam, albuterol, cefdinir, HYDROcodone-acetaminophen, phentermine, ibuprofen, Vitamin D (Ergocalciferol), buPROPion, and PARoxetine.  No orders of the defined types were placed in this encounter.    Follow-up: No Follow-up on file.  Walker Kehr, MD

## 2016-04-23 NOTE — Progress Notes (Signed)
Pre visit review using our clinic review tool, if applicable. No additional management support is needed unless otherwise documented below in the visit note. 

## 2016-04-23 NOTE — Assessment & Plan Note (Signed)
Xanax prn
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
## 2016-06-13 NOTE — Progress Notes (Signed)
Pre visit review using our clinic review tool, if applicable. No additional management support is needed unless otherwise documented below in the visit note. 

## 2016-06-14 ENCOUNTER — Ambulatory Visit (INDEPENDENT_AMBULATORY_CARE_PROVIDER_SITE_OTHER): Payer: BLUE CROSS/BLUE SHIELD | Admitting: *Deleted

## 2016-06-14 VITALS — BP 132/78 | HR 82 | Resp 20 | Ht 67.0 in | Wt 373.0 lb

## 2016-06-14 DIAGNOSIS — Z0000 Encounter for general adult medical examination without abnormal findings: Secondary | ICD-10-CM

## 2016-06-14 NOTE — Patient Instructions (Signed)
  Stephanie Harper , Thank you for taking time to come for your Medicare Wellness Visit. I appreciate your ongoing commitment to your health goals. Please review the following plan we discussed and let me know if I can assist you in the future.   These are the goals we discussed: Goals    . lose weight so can play in the dirt plant flowers          Go to see doctor Hassell Done to start the lap band process, be more active when we get the pool filled walking laps.         This is a list of the screening recommended for you and due dates:  Health Maintenance  Topic Date Due  . HIV Screening  03/15/1986  . Pap Smear  03/28/2015  . Flu Shot  08/07/2016  . Tetanus Vaccine  10/31/2021

## 2016-06-18 NOTE — Progress Notes (Signed)
Medical screening examination/treatment/procedure(s) were performed by he Brewing technologist, Therapist, sports. As primary care provider I was immediately available for consulation/collaboration. I agree with above documentation. Wilfred Lacy, AGNP-C

## 2016-07-22 ENCOUNTER — Other Ambulatory Visit (INDEPENDENT_AMBULATORY_CARE_PROVIDER_SITE_OTHER): Payer: BLUE CROSS/BLUE SHIELD

## 2016-07-22 ENCOUNTER — Encounter: Payer: Self-pay | Admitting: Internal Medicine

## 2016-07-22 ENCOUNTER — Ambulatory Visit (INDEPENDENT_AMBULATORY_CARE_PROVIDER_SITE_OTHER): Payer: BLUE CROSS/BLUE SHIELD | Admitting: Internal Medicine

## 2016-07-22 DIAGNOSIS — I10 Essential (primary) hypertension: Secondary | ICD-10-CM

## 2016-07-22 DIAGNOSIS — M5440 Lumbago with sciatica, unspecified side: Secondary | ICD-10-CM | POA: Diagnosis not present

## 2016-07-22 DIAGNOSIS — J4520 Mild intermittent asthma, uncomplicated: Secondary | ICD-10-CM

## 2016-07-22 DIAGNOSIS — G8929 Other chronic pain: Secondary | ICD-10-CM

## 2016-07-22 DIAGNOSIS — F419 Anxiety disorder, unspecified: Secondary | ICD-10-CM

## 2016-07-22 DIAGNOSIS — T670XXS Heatstroke and sunstroke, sequela: Secondary | ICD-10-CM

## 2016-07-22 DIAGNOSIS — T6701XA Heatstroke and sunstroke, initial encounter: Secondary | ICD-10-CM | POA: Insufficient documentation

## 2016-07-22 DIAGNOSIS — T6701XS Heatstroke and sunstroke, sequela: Secondary | ICD-10-CM

## 2016-07-22 LAB — HEPATIC FUNCTION PANEL
ALBUMIN: 3.7 g/dL (ref 3.5–5.2)
ALT: 13 U/L (ref 0–35)
AST: 12 U/L (ref 0–37)
Alkaline Phosphatase: 76 U/L (ref 39–117)
BILIRUBIN DIRECT: 0.1 mg/dL (ref 0.0–0.3)
TOTAL PROTEIN: 6.9 g/dL (ref 6.0–8.3)
Total Bilirubin: 0.4 mg/dL (ref 0.2–1.2)

## 2016-07-22 LAB — URINALYSIS
BILIRUBIN URINE: NEGATIVE
Hgb urine dipstick: NEGATIVE
KETONES UR: NEGATIVE
LEUKOCYTES UA: NEGATIVE
Nitrite: NEGATIVE
PH: 6.5 (ref 5.0–8.0)
SPECIFIC GRAVITY, URINE: 1.025 (ref 1.000–1.030)
TOTAL PROTEIN, URINE-UPE24: NEGATIVE
URINE GLUCOSE: NEGATIVE
Urobilinogen, UA: 0.2 (ref 0.0–1.0)

## 2016-07-22 LAB — CBC WITH DIFFERENTIAL/PLATELET
Basophils Absolute: 0.1 10*3/uL (ref 0.0–0.1)
Basophils Relative: 0.6 % (ref 0.0–3.0)
EOS PCT: 1.3 % (ref 0.0–5.0)
Eosinophils Absolute: 0.1 10*3/uL (ref 0.0–0.7)
HCT: 42 % (ref 36.0–46.0)
Hemoglobin: 14.1 g/dL (ref 12.0–15.0)
LYMPHS ABS: 2 10*3/uL (ref 0.7–4.0)
Lymphocytes Relative: 18.5 % (ref 12.0–46.0)
MCHC: 33.7 g/dL (ref 30.0–36.0)
MCV: 88.5 fl (ref 78.0–100.0)
MONO ABS: 0.9 10*3/uL (ref 0.1–1.0)
MONOS PCT: 8.1 % (ref 3.0–12.0)
NEUTROS ABS: 7.6 10*3/uL (ref 1.4–7.7)
NEUTROS PCT: 71.5 % (ref 43.0–77.0)
PLATELETS: 305 10*3/uL (ref 150.0–400.0)
RBC: 4.74 Mil/uL (ref 3.87–5.11)
RDW: 14.1 % (ref 11.5–15.5)
WBC: 10.6 10*3/uL — ABNORMAL HIGH (ref 4.0–10.5)

## 2016-07-22 LAB — BASIC METABOLIC PANEL
BUN: 11 mg/dL (ref 6–23)
CALCIUM: 9.3 mg/dL (ref 8.4–10.5)
CO2: 31 meq/L (ref 19–32)
Chloride: 103 mEq/L (ref 96–112)
Creatinine, Ser: 0.58 mg/dL (ref 0.40–1.20)
GFR: 119.3 mL/min (ref >60.00)
GLUCOSE: 114 mg/dL — AB (ref 70–99)
POTASSIUM: 4 meq/L (ref 3.5–5.1)
SODIUM: 140 meq/L (ref 135–145)

## 2016-07-22 LAB — MAGNESIUM: Magnesium: 1.8 mg/dL (ref 1.5–2.5)

## 2016-07-22 MED ORDER — HYDROCODONE-ACETAMINOPHEN 5-325 MG PO TABS: 1.0000 | ORAL_TABLET | Freq: Four times a day (QID) | ORAL | 0 refills | Status: DC | PRN

## 2016-07-22 MED ORDER — ALPRAZOLAM 1 MG PO TABS: ORAL_TABLET | ORAL | 2 refills | Status: DC
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
## 2016-07-22 NOTE — Assessment & Plan Note (Signed)
norco prn

## 2016-07-22 NOTE — Assessment & Plan Note (Signed)
Off Rx

## 2016-07-22 NOTE — Assessment & Plan Note (Addendum)
Breo prn D/c smoking

## 2016-07-22 NOTE — Assessment & Plan Note (Signed)
Avoid heat  Labs

## 2016-07-22 NOTE — Assessment & Plan Note (Signed)
Xanax prn  Potential benefits of a long term benzodiazepines  use as well as potential risks  and complications were explained to the patient and were aknowledged.

## 2016-07-25 DIAGNOSIS — M791 Myalgia: Secondary | ICD-10-CM | POA: Diagnosis not present

## 2016-07-25 DIAGNOSIS — L921 Necrobiosis lipoidica, not elsewhere classified: Secondary | ICD-10-CM | POA: Diagnosis not present

## 2016-08-14 DIAGNOSIS — L921 Necrobiosis lipoidica, not elsewhere classified: Secondary | ICD-10-CM | POA: Diagnosis not present

## 2016-08-14 DIAGNOSIS — Z4651 Encounter for fitting and adjustment of gastric lap band: Secondary | ICD-10-CM | POA: Diagnosis not present

## 2016-08-20 ENCOUNTER — Encounter: Payer: BLUE CROSS/BLUE SHIELD | Attending: Internal Medicine | Admitting: Skilled Nursing Facility1

## 2016-08-20 ENCOUNTER — Encounter: Payer: Self-pay | Admitting: Skilled Nursing Facility1

## 2016-08-20 DIAGNOSIS — Z713 Dietary counseling and surveillance: Secondary | ICD-10-CM | POA: Diagnosis not present

## 2016-08-20 DIAGNOSIS — Z4651 Encounter for fitting and adjustment of gastric lap band: Secondary | ICD-10-CM | POA: Insufficient documentation

## 2016-08-20 DIAGNOSIS — E669 Obesity, unspecified: Secondary | ICD-10-CM

## 2016-08-20 NOTE — Patient Instructions (Addendum)
-  Work on getting better sleep   -Attend a support group   -Look at the arm chair exercises  -Keep the my fitness pal: put everything you put in your mouth in my fitness pal
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
## 2016-09-07 ENCOUNTER — Other Ambulatory Visit: Payer: Self-pay | Admitting: Internal Medicine

## 2016-09-30 IMAGING — CR DG ABDOMEN 1V
2 series · 2 of 2 positions shown · non-contrast
Comparison: KUB January 22, 2012.

CLINICAL DATA: Difficulty with the lap band over the past year,
frequent vomiting, fluid removed from lap band today.

EXAM:
ABDOMEN - 1 VIEW

[t abdomen supine *]
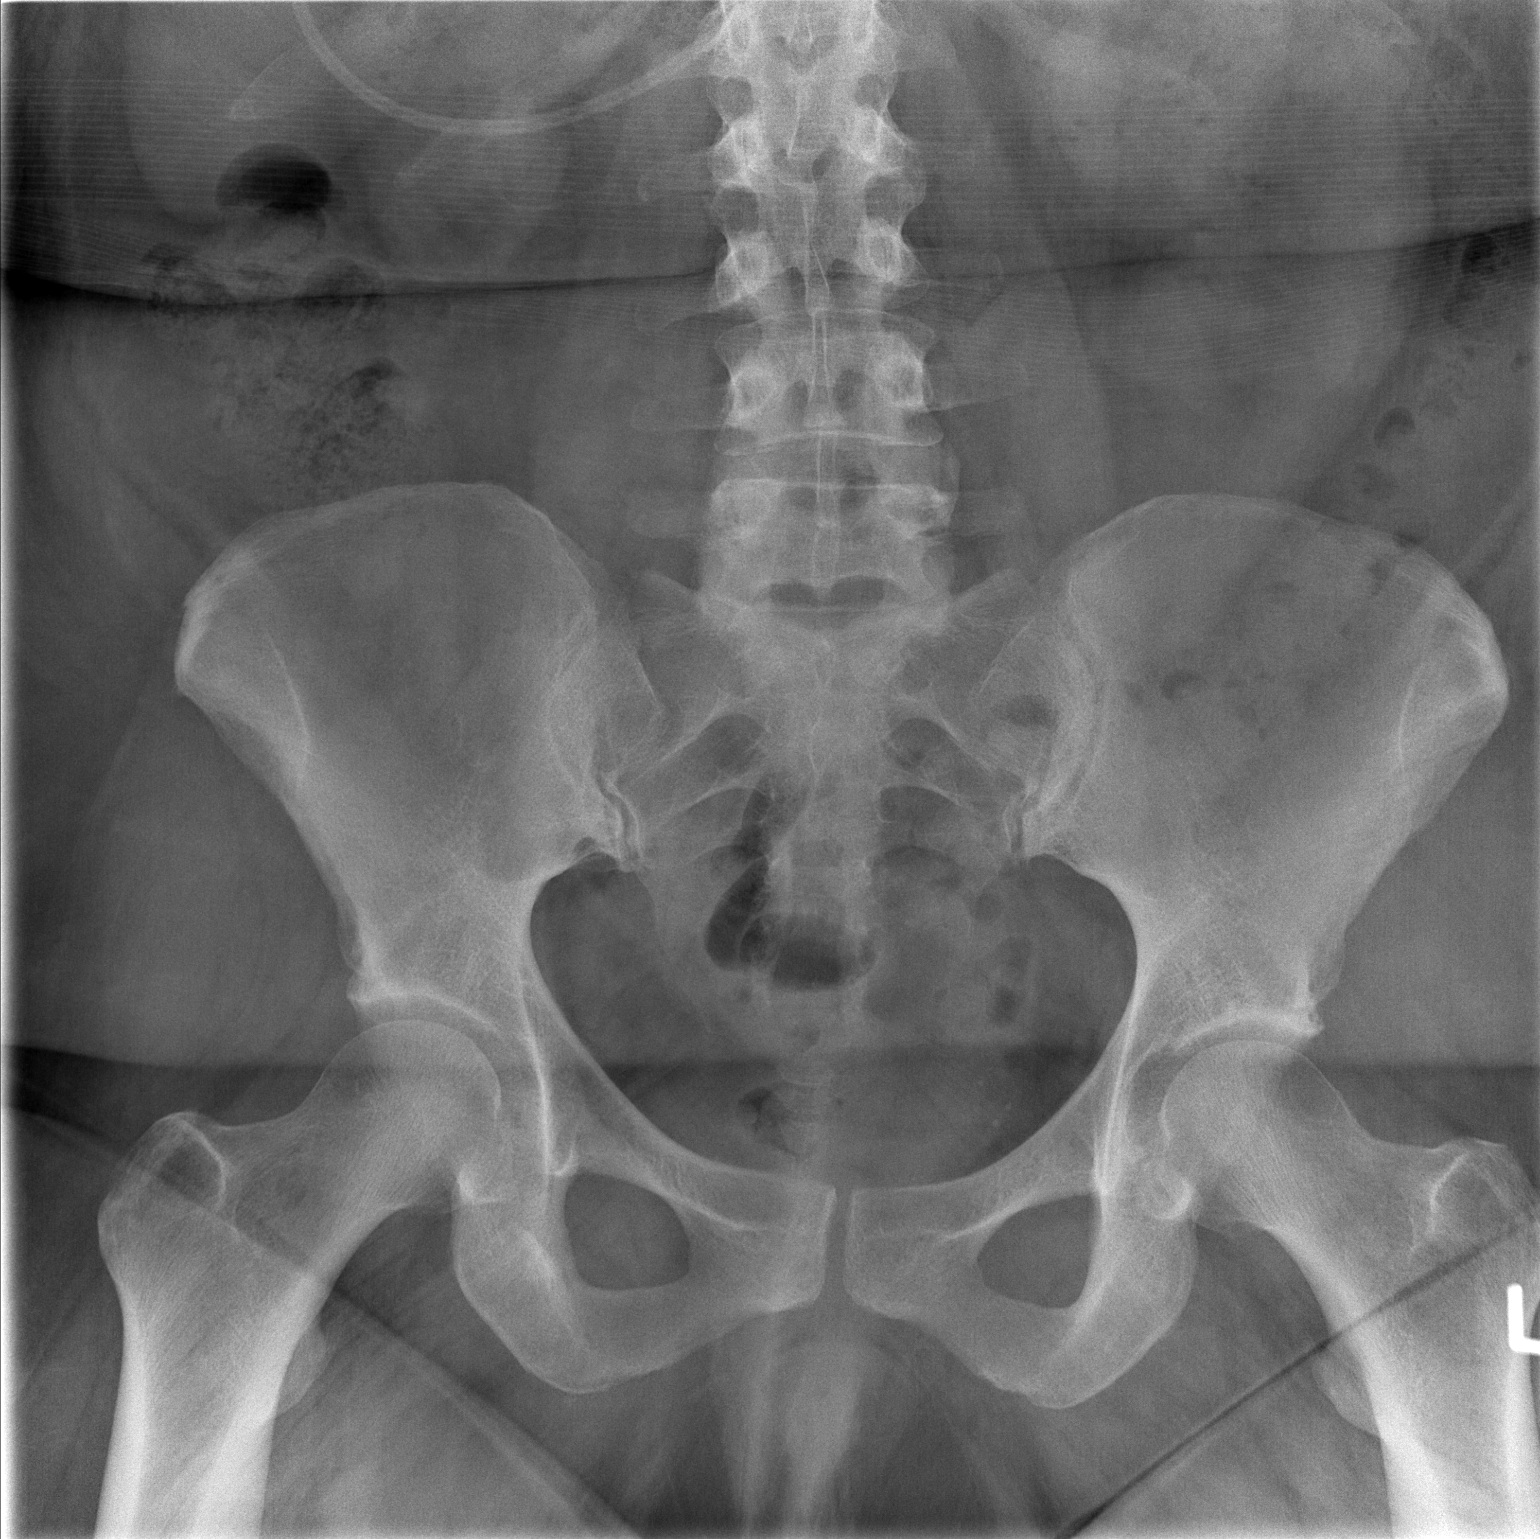

[t abdomen supine]
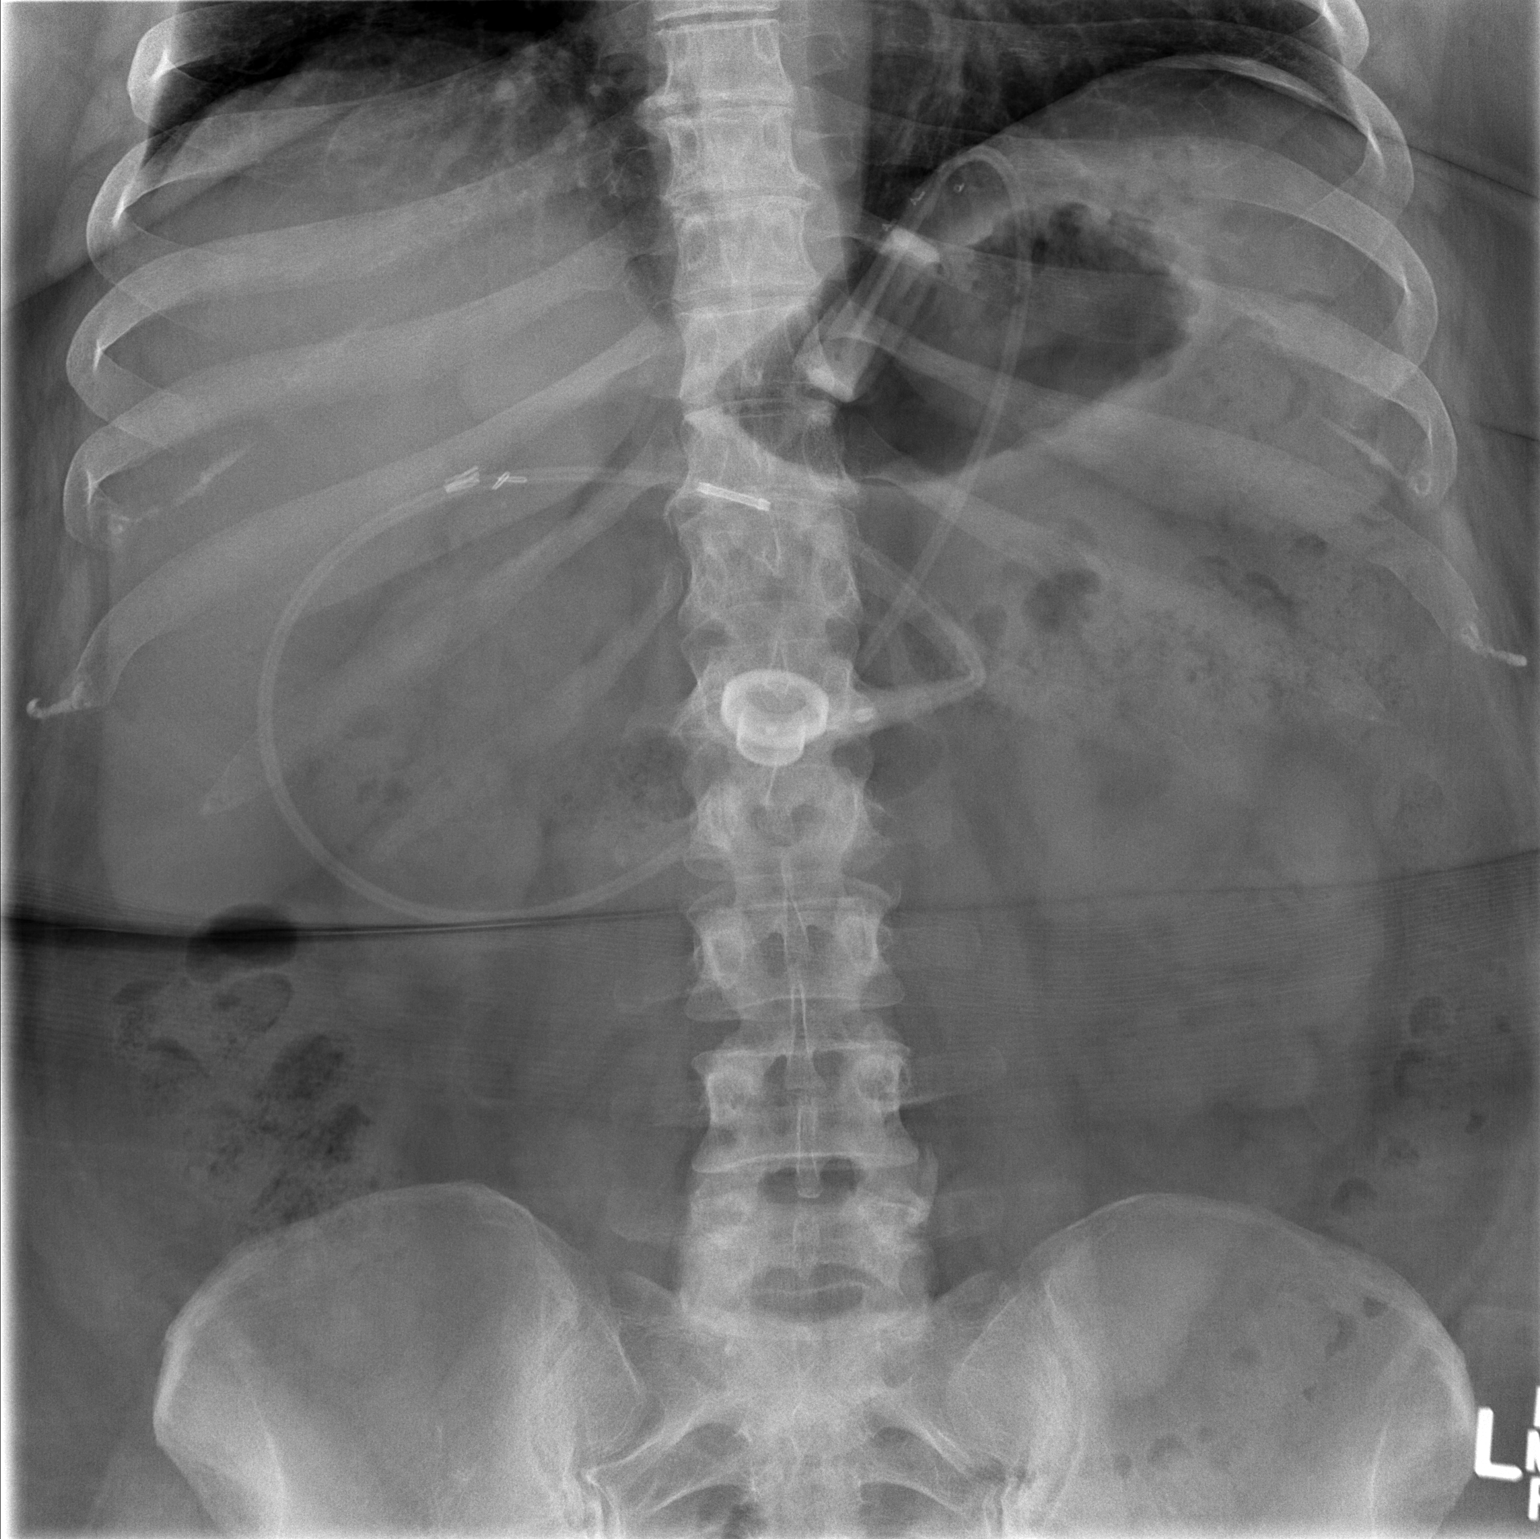

[2 of 2 positions shown; findings below may reference images not displayed]

FINDINGS: The lap band device appears to be in appropriate position at the
expected location of the GE junction. The reservoir and connecting
tubing are unremarkable. There is gas within the stomach. The bowel
gas pattern is unremarkable. The lung bases are clear. The bony
structures are normal.
IMPRESSION: No acute intra-abdominal abnormality is observed. The lap band
device appears to be in appropriate position.

## 2016-10-02 DIAGNOSIS — Z4651 Encounter for fitting and adjustment of gastric lap band: Secondary | ICD-10-CM | POA: Diagnosis not present

## 2016-10-07 ENCOUNTER — Ambulatory Visit (INDEPENDENT_AMBULATORY_CARE_PROVIDER_SITE_OTHER): Payer: BLUE CROSS/BLUE SHIELD

## 2016-10-07 ENCOUNTER — Ambulatory Visit (INDEPENDENT_AMBULATORY_CARE_PROVIDER_SITE_OTHER): Payer: BLUE CROSS/BLUE SHIELD | Admitting: Orthopedic Surgery

## 2016-10-07 ENCOUNTER — Encounter (INDEPENDENT_AMBULATORY_CARE_PROVIDER_SITE_OTHER): Payer: Self-pay | Admitting: Orthopedic Surgery

## 2016-10-07 ENCOUNTER — Ambulatory Visit (INDEPENDENT_AMBULATORY_CARE_PROVIDER_SITE_OTHER): Payer: Self-pay

## 2016-10-07 DIAGNOSIS — M25512 Pain in left shoulder: Secondary | ICD-10-CM | POA: Diagnosis not present

## 2016-10-07 DIAGNOSIS — M25511 Pain in right shoulder: Secondary | ICD-10-CM | POA: Diagnosis not present

## 2016-10-07 DIAGNOSIS — G8929 Other chronic pain: Secondary | ICD-10-CM | POA: Diagnosis not present

## 2016-10-10 NOTE — Progress Notes (Signed)
Office Visit Note   Patient: Stephanie Harper           Date of Birth: 05-26-1970           MRN: [id number] Visit Date: 10/07/2016 Requested by: Cassandria Anger, MD [place], [place] [id number] PCP: Cassandria Anger, MD  Subjective: Chief Complaint  Patient presents with  . Shoulder Pain    bilateral shoulder pain-R>L    HPI: Stephanie Harper is a 45 year old patient with bilateral shoulder pain right worse than left.  She had previous right shoulder replacement in 2006 and also history of left shoulder fracture fixation in 2009.  Reports recent increase in pain.  Denies any fevers or chills.  Denies any history of trauma or injury to the right shoulder.  She is right-hand dominant.  She reports popping and cracking worse on the left-hand side.  This pain will wake her from sleep at night.  She is taking Norco for her pain.  Reports some pain in both biceps region.  Denies any neck pain or numbness and tingling in the arms.  She did have lap band surgery 2014 and had to have that revised.  7 weeks ago she was told that her ESR CRP and white blood cell count were elevated.  She has not been on antibiotics.              ROS: All systems reviewed are negative as they relate to the chief complaint within the history of present illness.  Patient denies  fevers or chills.   Assessment & Plan: Visit Diagnoses:  1. Chronic pain of both shoulders     Plan: Impression is right shoulder pain and left shoulder pain with no evidence on exam or radiographs of infection.  No evidence of hardware complication.  No warmth to either shoulder.  I would favor clinical recheck in 3 months with possible repeat lab work done at that time and possible aspiration of the right shoulder if she remains symptomatic.  We can decide then whether not to proceed with further workup.  It could be that she is developing wear on the glenoid side of her shoulder replacement.  Infection seems less likely at this  time.  Subscap repair seems to be intact on exam.  Follow-Up Instructions: Return in about 3 months (around 01/07/2017).   Orders:  Orders Placed This Encounter  Procedures  . XR Shoulder Right  . XR Shoulder Left   No orders of the defined types were placed in this encounter.     Procedures: No procedures performed   Clinical Data: No additional findings.  Objective: Vital Signs: There were no vitals taken for this visit.  Physical Exam:   Constitutional: Patient appears well-developed HEENT:  Head: Normocephalic Eyes:EOM are normal Neck: Normal range of motion Cardiovascular: Normal rate Pulmonary/chest: Effort normal Neurologic: Patient is alert Skin: Skin is warm Psychiatric: Patient has normal mood and affect    Ortho Exam: Orthopedic exam demonstrates full active and passive range of motion of the elbows and wrists.  Shoulder has no real pain with passive range of motion on the right.  Cuff strength feels intact.  No masses lymph adenopathy or skin changes noted in the right shoulder region.  Incision is tender but very well healed with no erythema or warmth.  On the left-hand side she has slightly more limited range of motion but good cuff strength.  Incision also well-healed on the side with no surrounding erythema or lymphadenopathy.  Neck range of motion is full  Specialty Comments:  No specialty comments available.  Imaging: No results found.   PMFS History: Patient Active Problem List   Diagnosis Date Noted  . Heat stroke 07/22/2016  . Vitamin D deficiency 07/22/2014  . Well adult exam 04/26/2014  . Rash and nonspecific skin eruption 01/25/2014  . MDD (major depressive disorder) 06/17/2013  . GAD (generalized anxiety disorder) 06/17/2013  . Orthostatic lightheadedness 06/09/2012  . Venous insufficiency of leg 03/04/2012  . Lapband APS Jan 2014 01/21/2012  . Arthralgia 10/04/2011  . Night sweats 06/05/2010  . ANAL OR RECTAL PAIN 01/05/2010  .  HEMATOCHEZIA 01/05/2010  . ELBOW PAIN 10/31/2009  . Acute upper respiratory infection 12/06/2008  . SHOULDER PAIN 12/06/2008  . CONTACT DERMATITIS 06/03/2008  . OBESITY, MORBID 05/04/2008  . TOBACCO USE DISORDER/SMOKER-SMOKING CESSATION DISCUSSED 07/28/2007  . Chronic anxiety 12/26/2006  . PANIC ATTACK 12/26/2006  . Essential hypertension 12/26/2006  . Asthma 12/26/2006  . Depression 08/28/2006  . LOW BACK PAIN 08/28/2006   Past Medical History:  Diagnosis Date  . Anal fissure 2012   Dr Ardis Hughs  . Anxiety   . Asthma    COLD WEATHER RELATED  . Baker's cyst of knee    Right knee  . Complication of anesthesia    PANIC ATTACK RIGHT BEFORE SHOULDER REPLACMENT SURGERY AT [facility]; PT STATES CLAUSTROPHOBIA AND DOES NOT WANT TO BE AWARE OF BEING STRAPPED DOWN IN OR  . Depression   . HTN (hypertension)   . LBP (low back pain)   . Obesity   . Osteomyelitis ([place])    at [facility]  . Pain    HX OF BILATERAL SHOULDER SURGERIES-CHRONIC SHOULDER PAIN ;  HX OF BILATERAL KNEE PAIN  . Sepsis(995.91) 2005   From shoulder replacement surgery  . Shoulder fracture, left     Family History  Problem Relation Age of Onset  . Arthritis Mother   . Diabetes Mother   . Hyperlipidemia Mother   . Hypertension Mother   . Arthritis Father   . Diabetes Father   . Hyperlipidemia Father   . Hypertension Father   . Heart disease Father   . Coronary artery disease Other   . Hyperlipidemia Other   . Hypertension Other   . Bipolar disorder Brother   . Anxiety disorder Neg Hx   . Depression Neg Hx   . Dementia Neg Hx   . Alcohol abuse Neg Hx   . Drug abuse Neg Hx   . Schizophrenia Neg Hx     Past Surgical History:  Procedure Laterality Date  . APPENDECTOMY    . BREATH TEK H PYLORI  07/15/2011   Procedure: BREATH TEK H PYLORI;  Surgeon: Pedro Earls, MD;  Location: [facility];  Service: General;  Laterality: N/A;  . CHOLECYSTECTOMY    . LAPAROSCOPIC GASTRIC BANDING  01/21/2012   Procedure:  LAPAROSCOPIC GASTRIC BANDING;  Surgeon: Pedro Earls, MD;  Location: [facility];  Service: General;  Laterality: N/A;  . LASER ABLATION    . MESH APPLIED TO LAP PORT  01/21/2012   Procedure: MESH APPLIED TO LAP PORT;  Surgeon: Pedro Earls, MD;  Location: [facility];  Service: General;;  . PLANTAR FASCIA RELEASE  1996  . TONSILLECTOMY    . TOTAL SHOULDER REPLACEMENT     Right  . TUBAL LIGATION     UTERINE ABLATION WAS DONE AT THE TIME OF TUBAL LIGATION   Social History   Occupational History  .  Housewife Unemployed   Social History Main Topics  . Smoking status: Current Every Day Smoker    Packs/day: 1.00    Years: 31.00    Types: Cigarettes  . Smokeless tobacco: Never Used     Comment: WAS SMOKING 2 PPD-HAS CUT BACK TO 1 PPD OR LESS  . Alcohol use No  . Drug use: No  . Sexual activity: Yes

## 2016-10-18 ENCOUNTER — Encounter: Payer: Self-pay | Admitting: Internal Medicine

## 2016-10-18 ENCOUNTER — Ambulatory Visit (INDEPENDENT_AMBULATORY_CARE_PROVIDER_SITE_OTHER): Payer: BLUE CROSS/BLUE SHIELD | Admitting: Internal Medicine

## 2016-10-18 ENCOUNTER — Other Ambulatory Visit (INDEPENDENT_AMBULATORY_CARE_PROVIDER_SITE_OTHER): Payer: BLUE CROSS/BLUE SHIELD

## 2016-10-18 VITALS — BP 126/84 | HR 83 | Temp 98.2°F | Ht 67.0 in | Wt 363.0 lb

## 2016-10-18 DIAGNOSIS — M2550 Pain in unspecified joint: Secondary | ICD-10-CM | POA: Diagnosis not present

## 2016-10-18 DIAGNOSIS — M5440 Lumbago with sciatica, unspecified side: Secondary | ICD-10-CM | POA: Diagnosis not present

## 2016-10-18 DIAGNOSIS — Z23 Encounter for immunization: Secondary | ICD-10-CM | POA: Diagnosis not present

## 2016-10-18 DIAGNOSIS — E559 Vitamin D deficiency, unspecified: Secondary | ICD-10-CM

## 2016-10-18 DIAGNOSIS — F419 Anxiety disorder, unspecified: Secondary | ICD-10-CM | POA: Diagnosis not present

## 2016-10-18 DIAGNOSIS — G8929 Other chronic pain: Secondary | ICD-10-CM

## 2016-10-18 DIAGNOSIS — R21 Rash and other nonspecific skin eruption: Secondary | ICD-10-CM | POA: Diagnosis not present

## 2016-10-18 LAB — CBC WITH DIFFERENTIAL/PLATELET
BASOS PCT: 0.6 % (ref 0.0–3.0)
Basophils Absolute: 0.1 10*3/uL (ref 0.0–0.1)
Eosinophils Absolute: 0.1 10*3/uL (ref 0.0–0.7)
Eosinophils Relative: 0.7 % (ref 0.0–5.0)
HEMATOCRIT: 43.5 % (ref 36.0–46.0)
Hemoglobin: 14.5 g/dL (ref 12.0–15.0)
LYMPHS ABS: 2 10*3/uL (ref 0.7–4.0)
LYMPHS PCT: 19.1 % (ref 12.0–46.0)
MCHC: 33.3 g/dL (ref 30.0–36.0)
MCV: 90.1 fl (ref 78.0–100.0)
MONOS PCT: 7.2 % (ref 3.0–12.0)
Monocytes Absolute: 0.8 10*3/uL (ref 0.1–1.0)
NEUTROS ABS: 7.6 10*3/uL (ref 1.4–7.7)
NEUTROS PCT: 72.4 % (ref 43.0–77.0)
PLATELETS: 324 10*3/uL (ref 150.0–400.0)
RBC: 4.83 Mil/uL (ref 3.87–5.11)
RDW: 14.1 % (ref 11.5–15.5)
WBC: 10.5 10*3/uL (ref 4.0–10.5)

## 2016-10-18 LAB — SEDIMENTATION RATE: SED RATE: 33 mm/h — AB (ref 0–20)

## 2016-10-18 LAB — URIC ACID: URIC ACID, SERUM: 5.7 mg/dL (ref 2.4–7.0)

## 2016-10-18 MED ORDER — ALPRAZOLAM 1 MG PO TABS: ORAL_TABLET | ORAL | 2 refills | Status: DC

## 2016-10-18 MED ORDER — HYDROCODONE-ACETAMINOPHEN 5-325 MG PO TABS: 1.0000 | ORAL_TABLET | Freq: Four times a day (QID) | ORAL | 0 refills | Status: DC | PRN

## 2016-10-18 NOTE — Assessment & Plan Note (Signed)
Try gluten free diet

## 2016-10-18 NOTE — Assessment & Plan Note (Signed)
Vit D 

## 2016-10-18 NOTE — Assessment & Plan Note (Signed)
Xanax prn  Potential benefits of a long term benzodiazepines  use as well as potential risks  and complications were explained to the patient and were aknowledged.

## 2016-10-18 NOTE — Assessment & Plan Note (Signed)
Norco prn  Potential benefits of a long term opioids use as well as potential risks (i.e. addiction risk, apnea etc) and complications (i.e. Somnolence, constipation and others) were explained to the patient and were aknowledged.

## 2016-10-18 NOTE — Addendum Note (Signed)
Addended by: Karren Cobble on: 10/18/2016 03:33 PM   Modules accepted: Orders

## 2016-10-18 NOTE — Assessment & Plan Note (Addendum)
Labs Rhem ref offered

## 2016-10-18 NOTE — Progress Notes (Signed)
Subjective:  Patient ID: Stephanie Harper, female    DOB: September 27, 1970  Age: 46 y.o. MRN: [id number]  CC: No chief complaint on file.   HPI [name] presents for chronic pain, anxiety, depression f/u C/o muscle pains, joints pain  Outpatient Medications Prior to Visit  Medication Sig Dispense Refill  . albuterol (VENTOLIN HFA) 108 (90 Base) MCG/ACT inhaler Inhale 2 puffs into the lungs every 6 (six) hours as needed. 1 Inhaler 5  . ALPRAZolam (XANAX) 1 MG tablet TAKE 1 TABLET 3 TIMES A DAY AS NEEDED FOR ANXIETY OR SLEEP 90 tablet 2  . BIOTIN PO Take 10,000 capsules by mouth daily.    [name] buPROPion (WELLBUTRIN SR) 100 MG 12 hr tablet TAKE 1 TABLET TWICE A DAY 180 tablet 3  . Cholecalciferol 1000 UNITS tablet Take 2 tablets (2,000 Units total) by mouth daily. 100 tablet 3  . clobetasol cream (TEMOVATE) 0.05 % APPLY TO LEG UNDER MOIST COMPRESS AT BEDTIME (NOT FOR FACE OR FOLDS) 60 g 2  . Fluticasone Furoate-Vilanterol (BREO ELLIPTA) 100-25 MCG/INH AEPB Inhale 1 puff into the lungs daily. 180 each 2  . furosemide (LASIX) 40 MG tablet Take 1 tablet (40 mg total) by mouth daily as needed. 30 tablet 5  . HYDROcodone-acetaminophen (NORCO/VICODIN) 5-325 MG tablet Take 1 tablet by mouth every 6 (six) hours as needed for moderate pain or severe pain. 120 tablet 0  . ibuprofen (ADVIL,MOTRIN) 600 MG tablet TAKE 1 TABLET (600 MG TOTAL) BY MOUTH 2 (TWO) TIMES DAILY AS NEEDED. 180 tablet 3  . Multiple Vitamin (MULTIVITAMIN) tablet Take 1 tablet by mouth 2 (two) times daily.     [name] PARoxetine (PAXIL) 10 MG tablet TAKE 1 TABLET EVERY DAY 90 tablet 3  . pentoxifylline (TRENTAL) 400 MG CR tablet Take 1 tablet by mouth 2 (two) times daily.    . vitamin B-12 (CYANOCOBALAMIN) 1000 MCG tablet Take 1,000 mcg by mouth daily.     . Vitamin D, Ergocalciferol, (DRISDOL) 50000 units CAPS capsule TAKE ONE CAPSULE ONCE A WEEK 6 capsule 3   No facility-administered medications prior to visit.     ROS Review of  Systems  Constitutional: Negative for activity change, appetite change, chills, fatigue and unexpected weight change.  HENT: Negative for congestion, mouth sores and sinus pressure.   Eyes: Negative for visual disturbance.  Respiratory: Negative for cough and chest tightness.   Gastrointestinal: Negative for abdominal pain and nausea.  Genitourinary: Negative for difficulty urinating, frequency and vaginal pain.  Musculoskeletal: Positive for arthralgias, back pain and myalgias. Negative for gait problem.  Skin: Negative for pallor and rash.  Neurological: Negative for dizziness, tremors, weakness, numbness and headaches.  Psychiatric/Behavioral: Negative for confusion and sleep disturbance.    Objective:  BP 126/84 (BP Location: Left Arm, Patient Position: Sitting, Cuff Size: Large)   Pulse 83   Temp 98.2 F (36.8 C) (Oral)   Ht 5' 7"  (1.702 m)   Wt (!) 363 lb (164.7 kg)   SpO2 98%   BMI 56.85 kg/m   BP Readings from Last 3 Encounters:  10/18/16 126/84  07/22/16 138/82  06/14/16 132/78    Wt Readings from Last 3 Encounters:  10/18/16 (!) 363 lb (164.7 kg)  08/20/16 (!) 369 lb 6.4 oz (167.6 kg)  07/22/16 (!) 371 lb (168.3 kg)    Physical Exam  Constitutional: She appears well-developed. No distress.  HENT:  Head: Normocephalic.  Right Ear: External ear normal.  Left Ear: External ear normal.  Nose: Nose normal.  Mouth/Throat: Oropharynx is clear and moist.  Eyes: Pupils are equal, round, and reactive to light. Conjunctivae are normal. Right eye exhibits no discharge. Left eye exhibits no discharge.  Neck: Normal range of motion. Neck supple. No JVD present. No tracheal deviation present. No thyromegaly present.  Cardiovascular: Normal rate, regular rhythm and normal heart sounds.   Pulmonary/Chest: No stridor. No respiratory distress. She has no wheezes.  Abdominal: Soft. Bowel sounds are normal. She exhibits no distension and no mass. There is no tenderness. There is  no rebound and no guarding.  Musculoskeletal: She exhibits tenderness. She exhibits no edema.  Lymphadenopathy:    She has no cervical adenopathy.  Neurological: She displays normal reflexes. No cranial nerve deficit. She exhibits normal muscle tone. Coordination normal.  Skin: No rash noted. No erythema.  Psychiatric: She has a normal mood and affect. Her behavior is normal. Judgment and thought content normal.    Lab Results  Component Value Date   WBC 10.6 (H) 07/22/2016   HGB 14.1 07/22/2016   HCT 42.0 07/22/2016   PLT 305.0 07/22/2016   GLUCOSE 114 (H) 07/22/2016   CHOL 169 01/19/2016   TRIG 68.0 01/19/2016   HDL 44.70 01/19/2016   LDLCALC 110 (H) 01/19/2016   ALT 13 07/22/2016   AST 12 07/22/2016   NA 140 07/22/2016   K 4.0 07/22/2016   CL 103 07/22/2016   CREATININE 0.58 07/22/2016   BUN 11 07/22/2016   CO2 31 07/22/2016   TSH 2.60 01/19/2016   INR 1.0 08/09/2007   HGBA1C 5.7 01/19/2016    Dg Chest 2 View  Result Date: 01/19/2016 CLINICAL DATA:  Productive cough . EXAM: CHEST  2 VIEW COMPARISON:  06/26/2011 . FINDINGS: Mediastinum and hilar structures normal. Cardiomegaly with normal pulmonary vascularity. No pleural effusion or pneumothorax. Postsurgical changes both shoulders. IMPRESSION: No acute cardiopulmonary disease. Electronically Signed   By: Marcello Moores  Register   On: 01/19/2016 14:42    Assessment & Plan:   There are no diagnoses linked to this encounter. I am having Ms. Springsteen maintain her vitamin B-12, multivitamin, BIOTIN PO, Cholecalciferol, furosemide, fluticasone furoate-vilanterol, pentoxifylline, clobetasol cream, albuterol, ibuprofen, buPROPion, PARoxetine, ALPRAZolam, HYDROcodone-acetaminophen, and Vitamin D (Ergocalciferol).  No orders of the defined types were placed in this encounter.    Follow-up: No Follow-up on file.  Walker Kehr, MD

## 2016-10-18 NOTE — Patient Instructions (Signed)
Gluten free trial for 4-6 weeks. OK to use gluten-free bread and gluten-free pasta.    Gluten-Free Diet for Celiac Disease, Adult The gluten-free diet includes all foods that do not contain gluten. Gluten is a protein that is found in wheat, rye, barley, and some other grains. Following the gluten-free diet is the only treatment for people with celiac disease. It helps to prevent damage to the intestines and improves or eliminates the symptoms of celiac disease. Following the gluten-free diet requires some planning. It can be challenging at first, but it gets easier with time and practice. There are more gluten-free options available today than ever before. If you need help finding gluten-free foods or if you have questions, talk with your diet and nutrition specialist (registered dietitian) or your health care provider. What do I need to know about a gluten-free diet?  All fruits, vegetables, and meats are safe to eat and do not contain gluten.  When grocery shopping, start by shopping in the produce, meat, and dairy sections. These sections are more likely to contain gluten-free foods. Then move to the aisles that contain packaged foods if you need to.  Read all food labels. Gluten is often added to foods. Always check the ingredient list and look for warnings, such as "may contain gluten."  Talk with your dietitian or health care provider before taking a gluten-free multivitamin or mineral supplement.  Be aware of gluten-free foods having contact with foods that contain gluten (cross-contamination). This can happen at home and with any processed foods. ? Talk with your health care provider or dietitian about how to reduce the risk of cross-contamination in your home. ? If you have questions about how a food is processed, ask the manufacturer. What key words help to identify gluten? Foods that list any of these key words on the label usually contain gluten:  Wheat, flour, enriched  flour, bromated flour, white flour, durum flour, graham flour, phosphated flour, self-rising flour, semolina, farina, barley (malt), rye, and oats.  Starch, dextrin, modified food starch, or cereal.  Thickening, fillers, or emulsifiers.  Malt flavoring, malt extract, or malt syrup.  Hydrolyzed vegetable protein.  In the [place] packaged foods that are gluten-free are required to be labeled "GF." These foods should be easy to identify and are safe to eat. In the [place] food companies are also required to list common food allergens, including wheat, on their labels. Recommended foods Grains  Amaranth, bean flours, 100% buckwheat flour, corn, millet, nut flours or nut meals, GF oats, quinoa, rice, sorghum, teff, rice wafers, pure cornmeal tortillas, popcorn, and hot cereals made from cornmeal. Hominy, rice, wild rice. Some Asian rice noodles or bean noodles. Arrowroot starch, corn bran, corn flour, corn germ, cornmeal, corn starch, potato flour, potato starch flour, and rice bran. Plain, brown, and sweet rice flours. Rice polish, soy flour, and tapioca starch. Vegetables  All plain fresh, frozen, and canned vegetables. Fruits  All plain fresh, frozen, canned, and dried fruits, and 100% fruit juices. Meats and other protein foods  All fresh beef, pork, poultry, fish, seafood, and eggs. Fish canned in water, oil, brine, or vegetable broth. Plain nuts and seeds, peanut butter. Some lunch meat and some frankfurters. Dried beans, dried peas, and lentils. Dairy  Fresh plain, dry, evaporated, or condensed milk. Cream, butter, sour cream, whipping cream, and most yogurts. Unprocessed cheese, most processed cheeses, some cottage cheese, some cream cheeses. Beverages  Coffee, tea, most herbal teas. Carbonated beverages and some root beers.  Wine, sake, and distilled spirits, such as gin, vodka, and whiskey. Most hard ciders. Fats and oils  Butter, margarine, vegetable oil, hydrogenated butter, olive  oil, shortening, lard, cream, and some mayonnaise. Some commercial salad dressings. Olives. Sweets and desserts  Sugar, honey, some syrups, molasses, jelly, and jam. Plain hard candy, marshmallows, and gumdrops. Pure cocoa powder. Plain chocolate. Custard and some pudding mixes. Gelatin desserts, sorbets, frozen ice pops, and sherbet. Cake, cookies, and other desserts prepared with allowed flours. Some commercial ice creams. Cornstarch, tapioca, and rice puddings. Seasoning and other foods  Some canned or frozen soups. Monosodium glutamate (MSG). Cider, rice, and wine vinegar. Baking soda and baking powder. Cream of tartar. Baking and nutritional yeast. Certain soy sauces made without wheat (ask your dietitian about specific brands that are allowed). Nuts, coconut, and chocolate. Salt, pepper, herbs, spices, flavoring extracts, imitation or artificial flavorings, natural flavorings, and food colorings. Some medicines and supplements. Some lip glosses and other cosmetics. Rice syrups. The items listed may not be a complete list. Talk with your dietitian about what dietary choices are best for you. Foods to avoid Grains  Barley, bran, bulgur, couscous, cracked wheat, , farro, graham, malt, matzo, semolina, wheat germ, and all wheat and rye cereals including spelt and kamut. Cereals containing malt as a flavoring, such as rice cereal. Noodles, spaghetti, macaroni, most packaged rice mixes, and all mixes containing wheat, rye, barley, or triticale. Vegetables  Most creamed vegetables and most vegetables canned in sauces. Some commercially prepared vegetables and salads. Fruits  Thickened or prepared fruits and some pie fillings. Some fruit snacks and fruit roll-ups. Meats and other protein foods  Any meat or meat alternative containing wheat, rye, barley, or gluten stabilizers. These are often marinated or packaged meats and lunch meats. Bread-containing products, such as Swiss steak,  croquettes, meatballs, and meatloaf. Most tuna canned in vegetable broth and [place] with hydrolyzed vegetable protein (HVP) injected as part of the basting. Seitan. Imitation fish. Eggs in sauces made from ingredients to avoid. Dairy  Commercial chocolate milk drinks and malted milk. Some non-dairy creamers. Any cheese product containing ingredients to avoid. Beverages  Certain cereal beverages. Beer, ale, malted milk, and some root beers. Some hard ciders. Some instant flavored coffees. Some herbal teas made with barley or with barley malt added. Fats and oils  Some commercial salad dressings. Sour cream containing modified food starch. Sweets and desserts  Some toffees. Chocolate-coated nuts (may be rolled in wheat flour) and some commercial candies and candy bars. Most cakes, cookies, donuts, pastries, and other baked goods. Some commercial ice cream. Ice cream cones. Commercially prepared mixes for cakes, cookies, and other desserts. Bread pudding and other puddings thickened with flour. Products containing brown rice syrup made with barley malt enzyme. Desserts and sweets made with malt flavoring. Seasoning and other foods  Some curry powders, some dry seasoning mixes, some gravy extracts, some meat sauces, some ketchups, some prepared mustards, and horseradish. Certain soy sauces. Malt vinegar. Bouillon and bouillon cubes that contain HVP. Some chip dips, and some chewing gum. Yeast extract. Brewer's yeast. Caramel color. Some medicines and supplements. Some lip glosses and other cosmetics. The items listed may not be a complete list. Talk with your dietitian about what dietary choices are best for you. Summary  Gluten is a protein that is found in wheat, rye, barley, and some other grains. The gluten-free diet includes all foods that do not contain gluten.  If you need help finding gluten-free foods or if  you have questions, talk with your diet and nutrition specialist (registered  dietitian) or your health care provider.  Read all food labels. Gluten is often added to foods. Always check the ingredient list and look for warnings, such as "may contain gluten." This information is not intended to replace advice given to you by your health care provider. Make sure you discuss any questions you have with your health care provider. Document Released: 12/24/2004 Document Revised: 10/09/2015 Document Reviewed: 10/09/2015 Elsevier Interactive Patient Education  2018 [employer].

## 2016-10-22 ENCOUNTER — Ambulatory Visit: Payer: Self-pay | Admitting: Internal Medicine

## 2016-10-22 LAB — RHEUMATOID FACTOR: Rhuematoid fact SerPl-aCnc: <14 IU/mL (ref <14)

## 2016-10-22 LAB — ANA: ANA: NEGATIVE

## 2016-11-13 DIAGNOSIS — L98499 Non-pressure chronic ulcer of skin of other sites with unspecified severity: Secondary | ICD-10-CM | POA: Diagnosis not present

## 2016-11-13 DIAGNOSIS — L921 Necrobiosis lipoidica, not elsewhere classified: Secondary | ICD-10-CM | POA: Diagnosis not present

## 2016-11-22 DIAGNOSIS — L921 Necrobiosis lipoidica, not elsewhere classified: Secondary | ICD-10-CM | POA: Diagnosis not present

## 2016-11-22 DIAGNOSIS — Z4651 Encounter for fitting and adjustment of gastric lap band: Secondary | ICD-10-CM | POA: Diagnosis not present

## 2016-12-20 DIAGNOSIS — L921 Necrobiosis lipoidica, not elsewhere classified: Secondary | ICD-10-CM | POA: Diagnosis not present

## 2016-12-25 DIAGNOSIS — Z4651 Encounter for fitting and adjustment of gastric lap band: Secondary | ICD-10-CM | POA: Diagnosis not present

## 2017-01-08 ENCOUNTER — Ambulatory Visit (INDEPENDENT_AMBULATORY_CARE_PROVIDER_SITE_OTHER): Payer: BLUE CROSS/BLUE SHIELD | Admitting: Orthopedic Surgery

## 2017-01-13 ENCOUNTER — Ambulatory Visit (INDEPENDENT_AMBULATORY_CARE_PROVIDER_SITE_OTHER): Payer: BLUE CROSS/BLUE SHIELD | Admitting: Orthopedic Surgery

## 2017-01-16 DIAGNOSIS — Z1231 Encounter for screening mammogram for malignant neoplasm of breast: Secondary | ICD-10-CM | POA: Diagnosis not present

## 2017-01-16 DIAGNOSIS — Z6841 Body Mass Index (BMI) 40.0 and over, adult: Secondary | ICD-10-CM | POA: Diagnosis not present

## 2017-01-16 DIAGNOSIS — Z01419 Encounter for gynecological examination (general) (routine) without abnormal findings: Secondary | ICD-10-CM | POA: Diagnosis not present

## 2017-01-16 DIAGNOSIS — Z3040 Encounter for surveillance of contraceptives, unspecified: Secondary | ICD-10-CM | POA: Diagnosis not present

## 2017-01-16 DIAGNOSIS — Z124 Encounter for screening for malignant neoplasm of cervix: Secondary | ICD-10-CM | POA: Diagnosis not present

## 2017-01-16 LAB — HM PAP SMEAR: HM Pap smear: NORMAL

## 2017-01-16 LAB — HM MAMMOGRAPHY

## 2017-01-17 DIAGNOSIS — L921 Necrobiosis lipoidica, not elsewhere classified: Secondary | ICD-10-CM | POA: Diagnosis not present

## 2017-01-20 ENCOUNTER — Encounter: Payer: Self-pay | Admitting: Internal Medicine

## 2017-01-20 ENCOUNTER — Ambulatory Visit (INDEPENDENT_AMBULATORY_CARE_PROVIDER_SITE_OTHER): Payer: BLUE CROSS/BLUE SHIELD | Admitting: Internal Medicine

## 2017-01-20 DIAGNOSIS — M5440 Lumbago with sciatica, unspecified side: Secondary | ICD-10-CM

## 2017-01-20 DIAGNOSIS — G8929 Other chronic pain: Secondary | ICD-10-CM

## 2017-01-20 DIAGNOSIS — I872 Venous insufficiency (chronic) (peripheral): Secondary | ICD-10-CM

## 2017-01-20 DIAGNOSIS — F419 Anxiety disorder, unspecified: Secondary | ICD-10-CM | POA: Diagnosis not present

## 2017-01-20 DIAGNOSIS — R21 Rash and other nonspecific skin eruption: Secondary | ICD-10-CM | POA: Diagnosis not present

## 2017-01-20 MED ORDER — ALPRAZOLAM 1 MG PO TABS: ORAL_TABLET | ORAL | 2 refills | Status: DC

## 2017-01-20 MED ORDER — HYDROCODONE-ACETAMINOPHEN 5-325 MG PO TABS: 1.0000 | ORAL_TABLET | Freq: Four times a day (QID) | ORAL | 0 refills | Status: DC | PRN

## 2017-01-20 NOTE — Progress Notes (Signed)
Subjective:  Patient ID: Stephanie Harper, female    DOB: Aug 27, 1970  Age: 47 y.o. MRN: [id number]  CC: No chief complaint on file.   HPI Stephanie Harper presents for LLE swelling and rash - better w/wt loss and smoking less... On steroid injections q 1 mo, baby ASA, pentoxyphilline F/u LBP, anxiety  Outpatient Medications Prior to Visit  Medication Sig Dispense Refill  . albuterol (VENTOLIN HFA) 108 (90 Base) MCG/ACT inhaler Inhale 2 puffs into the lungs every 6 (six) hours as needed. 1 Inhaler 5  . ALPRAZolam (XANAX) 1 MG tablet TAKE 1 TABLET 3 TIMES A DAY AS NEEDED FOR ANXIETY OR SLEEP 90 tablet 2  . BABY ASPIRIN PO     . buPROPion (WELLBUTRIN SR) 100 MG 12 hr tablet TAKE 1 TABLET TWICE A DAY 180 tablet 3  . Cholecalciferol 1000 UNITS tablet Take 2 tablets (2,000 Units total) by mouth daily. 100 tablet 3  . clobetasol cream (TEMOVATE) 0.05 % APPLY TO LEG UNDER MOIST COMPRESS AT BEDTIME (NOT FOR FACE OR FOLDS) 60 g 2  . Fluticasone Furoate-Vilanterol (BREO ELLIPTA) 100-25 MCG/INH AEPB Inhale 1 puff into the lungs daily. 180 each 2  . furosemide (LASIX) 40 MG tablet Take 1 tablet (40 mg total) by mouth daily as needed. 30 tablet 5  . HYDROcodone-acetaminophen (NORCO/VICODIN) 5-325 MG tablet Take 1 tablet by mouth every 6 (six) hours as needed for moderate pain or severe pain. 120 tablet 0  . ibuprofen (ADVIL,MOTRIN) 600 MG tablet TAKE 1 TABLET (600 MG TOTAL) BY MOUTH 2 (TWO) TIMES DAILY AS NEEDED. 180 tablet 3  . Multiple Vitamin (MULTIVITAMIN) tablet Take 1 tablet by mouth 2 (two) times daily.     . niacinamide 500 MG tablet     . PARoxetine (PAXIL) 10 MG tablet TAKE 1 TABLET EVERY DAY 90 tablet 3  . pentoxifylline (TRENTAL) 400 MG CR tablet Take by mouth.    . vitamin B-12 (CYANOCOBALAMIN) 1000 MCG tablet Take 1,000 mcg by mouth daily.     . Vitamin D, Ergocalciferol, (DRISDOL) 50000 units CAPS capsule TAKE ONE CAPSULE ONCE A WEEK 6 capsule 3  . BIOTIN PO Take 10,000 capsules by  mouth daily.    . pentoxifylline (TRENTAL) 400 MG CR tablet Take 1 tablet by mouth 2 (two) times daily.     No facility-administered medications prior to visit.     ROS Review of Systems  Constitutional: Negative for activity change, appetite change, chills, fatigue and unexpected weight change.  HENT: Negative for congestion, mouth sores and sinus pressure.   Eyes: Negative for visual disturbance.  Respiratory: Negative for cough and chest tightness.   Cardiovascular: Positive for leg swelling.  Gastrointestinal: Negative for abdominal pain and nausea.  Genitourinary: Negative for difficulty urinating, frequency and vaginal pain.  Musculoskeletal: Positive for arthralgias. Negative for back pain and gait problem.  Skin: Positive for color change and rash. Negative for pallor.  Neurological: Negative for dizziness, tremors, weakness, numbness and headaches.  Psychiatric/Behavioral: Negative for confusion and sleep disturbance.    Objective:  BP 126/78 (BP Location: Left Arm, Patient Position: Sitting, Cuff Size: Large)   Pulse 91   Temp 98.3 F (36.8 C) (Oral)   Ht 5' 7"  (1.702 m)   Wt (!) 358 lb (162.4 kg)   SpO2 99%   BMI 56.07 kg/m   BP Readings from Last 3 Encounters:  01/20/17 126/78  10/18/16 126/84  07/22/16 138/82    Wt Readings from Last 3 Encounters:  01/20/17 (!) 358 lb (162.4 kg)  10/18/16 (!) 363 lb (164.7 kg)  08/20/16 (!) 369 lb 6.4 oz (167.6 kg)    Physical Exam  Constitutional: She appears well-developed. No distress.  HENT:  Head: Normocephalic.  Right Ear: External ear normal.  Left Ear: External ear normal.  Nose: Nose normal.  Mouth/Throat: Oropharynx is clear and moist.  Eyes: Conjunctivae are normal. Pupils are equal, round, and reactive to light. Right eye exhibits no discharge. Left eye exhibits no discharge.  Neck: Normal range of motion. Neck supple. No JVD present. No tracheal deviation present. No thyromegaly present.  Cardiovascular:  Normal rate, regular rhythm and normal heart sounds.  Pulmonary/Chest: No stridor. No respiratory distress. She has no wheezes.  Abdominal: Soft. Bowel sounds are normal. She exhibits no distension and no mass. There is no tenderness. There is no rebound and no guarding.  Musculoskeletal: She exhibits edema and tenderness.  Lymphadenopathy:    She has no cervical adenopathy.  Neurological: She displays normal reflexes. No cranial nerve deficit. She exhibits normal muscle tone. Coordination normal.  Skin: Rash noted. There is erythema.  Psychiatric: She has a normal mood and affect. Her behavior is normal. Judgment and thought content normal.  LLE looks better  Lab Results  Component Value Date   WBC 10.5 10/18/2016   HGB 14.5 10/18/2016   HCT 43.5 10/18/2016   PLT 324.0 10/18/2016   GLUCOSE 114 (H) 07/22/2016   CHOL 169 01/19/2016   TRIG 68.0 01/19/2016   HDL 44.70 01/19/2016   LDLCALC 110 (H) 01/19/2016   ALT 13 07/22/2016   AST 12 07/22/2016   NA 140 07/22/2016   K 4.0 07/22/2016   CL 103 07/22/2016   CREATININE 0.58 07/22/2016   BUN 11 07/22/2016   CO2 31 07/22/2016   TSH 2.60 01/19/2016   INR 1.0 08/09/2007   HGBA1C 5.7 01/19/2016    Dg Chest 2 View  Result Date: 01/19/2016 CLINICAL DATA:  Productive cough . EXAM: CHEST  2 VIEW COMPARISON:  06/26/2011 . FINDINGS: Mediastinum and hilar structures normal. Cardiomegaly with normal pulmonary vascularity. No pleural effusion or pneumothorax. Postsurgical changes both shoulders. IMPRESSION: No acute cardiopulmonary disease. Electronically Signed   By: Marcello Moores  Register   On: 01/19/2016 14:42    Assessment & Plan:   There are no diagnoses linked to this encounter. I have discontinued Stephanie Harper BIOTIN PO. I am also having her maintain her vitamin B-12, multivitamin, Cholecalciferol, furosemide, fluticasone furoate-vilanterol, clobetasol cream, albuterol, ibuprofen, buPROPion, PARoxetine, Vitamin D  (Ergocalciferol), ALPRAZolam, HYDROcodone-acetaminophen, niacinamide, BABY ASPIRIN PO, and pentoxifylline.  No orders of the defined types were placed in this encounter.    Follow-up: No Follow-up on file.  Walker Kehr, MD

## 2017-01-20 NOTE — Assessment & Plan Note (Signed)
Xanax prn  Potential benefits of a long term benzodiazepines  use as well as potential risks  and complications were explained to the patient and were aknowledged.

## 2017-01-20 NOTE — Assessment & Plan Note (Signed)
LLE swelling and rash - better w/wt loss and smoking less... On steroid injections q 1 mo, baby ASA, pentoxyphilline

## 2017-01-20 NOTE — Assessment & Plan Note (Signed)
Norco prn  Potential benefits of a long term opioids use as well as potential risks (i.e. addiction risk, apnea etc) and complications (i.e. Somnolence, constipation and others) were explained to the patient and were aknowledged.

## 2017-01-29 ENCOUNTER — Other Ambulatory Visit: Payer: Self-pay

## 2017-01-29 MED ORDER — IBUPROFEN 600 MG PO TABS: 600.0000 mg | ORAL_TABLET | Freq: Two times a day (BID) | ORAL | 3 refills | Status: DC | PRN

## 2017-02-05 DIAGNOSIS — Z4651 Encounter for fitting and adjustment of gastric lap band: Secondary | ICD-10-CM | POA: Diagnosis not present

## 2017-02-14 DIAGNOSIS — L921 Necrobiosis lipoidica, not elsewhere classified: Secondary | ICD-10-CM | POA: Diagnosis not present

## 2017-02-19 ENCOUNTER — Other Ambulatory Visit: Payer: Self-pay | Admitting: *Deleted

## 2017-02-19 MED ORDER — VITAMIN D (ERGOCALCIFEROL) 1.25 MG (50000 UNIT) PO CAPS: ORAL_CAPSULE | ORAL | 2 refills | Status: DC

## 2017-03-09 ENCOUNTER — Other Ambulatory Visit: Payer: Self-pay | Admitting: Internal Medicine

## 2017-03-13 DIAGNOSIS — L921 Necrobiosis lipoidica, not elsewhere classified: Secondary | ICD-10-CM | POA: Diagnosis not present

## 2017-03-13 DIAGNOSIS — D2339 Other benign neoplasm of skin of other parts of face: Secondary | ICD-10-CM | POA: Diagnosis not present

## 2017-03-13 DIAGNOSIS — D1039 Benign neoplasm of other parts of mouth: Secondary | ICD-10-CM | POA: Diagnosis not present

## 2017-04-14 ENCOUNTER — Encounter: Payer: Self-pay | Admitting: Internal Medicine

## 2017-04-14 ENCOUNTER — Ambulatory Visit (INDEPENDENT_AMBULATORY_CARE_PROVIDER_SITE_OTHER): Payer: BLUE CROSS/BLUE SHIELD | Admitting: Internal Medicine

## 2017-04-14 DIAGNOSIS — M5440 Lumbago with sciatica, unspecified side: Secondary | ICD-10-CM

## 2017-04-14 DIAGNOSIS — I10 Essential (primary) hypertension: Secondary | ICD-10-CM

## 2017-04-14 DIAGNOSIS — R21 Rash and other nonspecific skin eruption: Secondary | ICD-10-CM

## 2017-04-14 DIAGNOSIS — G8929 Other chronic pain: Secondary | ICD-10-CM | POA: Diagnosis not present

## 2017-04-14 DIAGNOSIS — E559 Vitamin D deficiency, unspecified: Secondary | ICD-10-CM | POA: Diagnosis not present

## 2017-04-14 DIAGNOSIS — F419 Anxiety disorder, unspecified: Secondary | ICD-10-CM

## 2017-04-14 MED ORDER — HYDROCODONE-ACETAMINOPHEN 5-325 MG PO TABS: 1.0000 | ORAL_TABLET | Freq: Four times a day (QID) | ORAL | 0 refills | Status: DC | PRN

## 2017-04-14 MED ORDER — FUROSEMIDE 40 MG PO TABS: 40.0000 mg | ORAL_TABLET | Freq: Every day | ORAL | 5 refills | Status: DC | PRN

## 2017-04-14 MED ORDER — ALPRAZOLAM 1 MG PO TABS: ORAL_TABLET | ORAL | 2 refills | Status: DC

## 2017-04-14 MED ORDER — FLUTICASONE FUROATE-VILANTEROL 100-25 MCG/INH IN AEPB: 1.0000 | INHALATION_SPRAY | Freq: Every day | RESPIRATORY_TRACT | 2 refills | Status: DC

## 2017-04-14 NOTE — Assessment & Plan Note (Signed)
Norco prn  Potential benefits of a long term opioids use as well as potential risks (i.e. addiction risk, apnea etc) and complications (i.e. Somnolence, constipation and others) were explained to the patient and were aknowledged.

## 2017-04-14 NOTE — Assessment & Plan Note (Signed)
resolved w/wt loss -- off Rx

## 2017-04-14 NOTE — Assessment & Plan Note (Signed)
On vit D

## 2017-04-14 NOTE — Assessment & Plan Note (Signed)
Better w/injections

## 2017-04-14 NOTE — Progress Notes (Signed)
Subjective:  Patient ID: Stephanie Harper, female    DOB: [birth year]  Age: 47 y.o. MRN: [id number]  CC: No chief complaint on file.   HPI Stephanie Harper presents for LLE rash F/u anxiety, chronic pain Smoking much less - 4 cigs  Outpatient Medications Prior to Visit  Medication Sig Dispense Refill  . albuterol (VENTOLIN HFA) 108 (90 Base) MCG/ACT inhaler Inhale 2 puffs into the lungs every 6 (six) hours as needed. 1 Inhaler 5  . ALPRAZolam (XANAX) 1 MG tablet TAKE 1 TABLET 3 TIMES A DAY AS NEEDED FOR ANXIETY OR SLEEP 90 tablet 2  . BABY ASPIRIN PO     . buPROPion (WELLBUTRIN SR) 100 MG 12 hr tablet TAKE 1 TABLET TWICE A DAY 180 tablet 1  . Cholecalciferol 1000 UNITS tablet Take 2 tablets (2,000 Units total) by mouth daily. 100 tablet 3  . clobetasol cream (TEMOVATE) 0.05 % APPLY TO LEG UNDER MOIST COMPRESS AT BEDTIME (NOT FOR FACE OR FOLDS) 60 g 2  . Fluticasone Furoate-Vilanterol (BREO ELLIPTA) 100-25 MCG/INH AEPB Inhale 1 puff into the lungs daily. 180 each 2  . furosemide (LASIX) 40 MG tablet Take 1 tablet (40 mg total) by mouth daily as needed. 30 tablet 5  . HYDROcodone-acetaminophen (NORCO/VICODIN) 5-325 MG tablet Take 1 tablet by mouth every 6 (six) hours as needed for moderate pain or severe pain. 120 tablet 0  . ibuprofen (ADVIL,MOTRIN) 600 MG tablet Take 1 tablet (600 mg total) by mouth 2 (two) times daily as needed. 180 tablet 3  . Multiple Vitamin (MULTIVITAMIN) tablet Take 1 tablet by mouth 2 (two) times daily.     . niacinamide 500 MG tablet     . PARoxetine (PAXIL) 10 MG tablet TAKE 1 TABLET EVERY DAY 90 tablet 1  . pentoxifylline (TRENTAL) 400 MG CR tablet Take by mouth.    . vitamin B-12 (CYANOCOBALAMIN) 1000 MCG tablet Take 1,000 mcg by mouth daily.     . Vitamin D, Ergocalciferol, (DRISDOL) 50000 units CAPS capsule TAKE ONE CAPSULE ONCE A WEEK 6 capsule 2   No facility-administered medications prior to visit.     ROS Review of Systems  Constitutional:  Negative for activity change, appetite change, chills, fatigue and unexpected weight change.  HENT: Negative for congestion, mouth sores and sinus pressure.   Eyes: Negative for visual disturbance.  Respiratory: Negative for cough and chest tightness.   Gastrointestinal: Negative for abdominal pain and nausea.  Genitourinary: Negative for difficulty urinating, frequency and vaginal pain.  Musculoskeletal: Positive for arthralgias and back pain. Negative for gait problem.  Skin: Positive for color change and rash. Negative for pallor.  Neurological: Negative for dizziness, tremors, weakness, numbness and headaches.  Psychiatric/Behavioral: Negative for confusion and sleep disturbance.    Objective:  BP 128/82 (BP Location: Left Arm, Patient Position: Sitting, Cuff Size: Large)   Pulse 76   Temp 98.3 F (36.8 C) (Oral)   Ht 5' 7"  (1.702 m)   Wt (!) 357 lb (161.9 kg)   SpO2 98%   BMI 55.91 kg/m   BP Readings from Last 3 Encounters:  04/14/17 128/82  01/20/17 126/78  10/18/16 126/84    Wt Readings from Last 3 Encounters:  04/14/17 (!) 357 lb (161.9 kg)  01/20/17 (!) 358 lb (162.4 kg)  10/18/16 (!) 363 lb (164.7 kg)    Physical Exam  Constitutional: She appears well-developed. No distress.  HENT:  Head: Normocephalic.  Right Ear: External ear normal.  Left Ear: External ear  normal.  Nose: Nose normal.  Mouth/Throat: Oropharynx is clear and moist.  Eyes: Pupils are equal, round, and reactive to light. Conjunctivae are normal. Right eye exhibits no discharge. Left eye exhibits no discharge.  Neck: Normal range of motion. Neck supple. No JVD present. No tracheal deviation present. No thyromegaly present.  Cardiovascular: Normal rate, regular rhythm and normal heart sounds.  Pulmonary/Chest: No stridor. No respiratory distress. She has no wheezes.  Abdominal: Soft. Bowel sounds are normal. She exhibits no distension and no mass. There is no tenderness. There is no rebound and no  guarding.  Musculoskeletal: She exhibits edema. She exhibits no tenderness.  Lymphadenopathy:    She has no cervical adenopathy.  Neurological: She displays normal reflexes. No cranial nerve deficit. She exhibits normal muscle tone. Coordination normal.  Skin: Rash noted. There is erythema.  Psychiatric: She has a normal mood and affect. Her behavior is normal. Judgment and thought content normal.  LLE discolored Spider veins R>L  Lab Results  Component Value Date   WBC 10.5 10/18/2016   HGB 14.5 10/18/2016   HCT 43.5 10/18/2016   PLT 324.0 10/18/2016   GLUCOSE 114 (H) 07/22/2016   CHOL 169 01/19/2016   TRIG 68.0 01/19/2016   HDL 44.70 01/19/2016   LDLCALC 110 (H) 01/19/2016   ALT 13 07/22/2016   AST 12 07/22/2016   NA 140 07/22/2016   K 4.0 07/22/2016   CL 103 07/22/2016   CREATININE 0.58 07/22/2016   BUN 11 07/22/2016   CO2 31 07/22/2016   TSH 2.60 01/19/2016   INR 1.0 08/09/2007   HGBA1C 5.7 01/19/2016    Dg Chest 2 View  Result Date: 01/19/2016 CLINICAL DATA:  Productive cough . EXAM: CHEST  2 VIEW COMPARISON:  06/26/2011 . FINDINGS: Mediastinum and hilar structures normal. Cardiomegaly with normal pulmonary vascularity. No pleural effusion or pneumothorax. Postsurgical changes both shoulders. IMPRESSION: No acute cardiopulmonary disease. Electronically Signed   By: Marcello Moores  Register   On: 01/19/2016 14:42    Assessment & Plan:   There are no diagnoses linked to this encounter. I am having Stephanie Aver "[name]" maintain her vitamin B-12, multivitamin, Cholecalciferol, furosemide, fluticasone furoate-vilanterol, clobetasol cream, albuterol, niacinamide, BABY ASPIRIN PO, pentoxifylline, ALPRAZolam, HYDROcodone-acetaminophen, ibuprofen, Vitamin D (Ergocalciferol), PARoxetine, and buPROPion.  No orders of the defined types were placed in this encounter.    Follow-up: No follow-ups on file.  Walker Kehr, MD

## 2017-04-14 NOTE — Assessment & Plan Note (Signed)
Xanax prn

## 2017-04-24 DIAGNOSIS — L921 Necrobiosis lipoidica, not elsewhere classified: Secondary | ICD-10-CM | POA: Diagnosis not present

## 2017-04-24 DIAGNOSIS — I788 Other diseases of capillaries: Secondary | ICD-10-CM | POA: Diagnosis not present

## 2017-05-06 ENCOUNTER — Other Ambulatory Visit: Payer: Self-pay | Admitting: Internal Medicine

## 2017-05-29 DIAGNOSIS — I8391 Asymptomatic varicose veins of right lower extremity: Secondary | ICD-10-CM | POA: Diagnosis not present

## 2017-05-29 DIAGNOSIS — L921 Necrobiosis lipoidica, not elsewhere classified: Secondary | ICD-10-CM | POA: Diagnosis not present

## 2017-05-31 ENCOUNTER — Other Ambulatory Visit: Payer: Self-pay | Admitting: Internal Medicine

## 2017-06-02 ENCOUNTER — Other Ambulatory Visit: Payer: Self-pay | Admitting: Internal Medicine

## 2017-06-28 ENCOUNTER — Other Ambulatory Visit: Payer: Self-pay | Admitting: Internal Medicine

## 2017-07-14 ENCOUNTER — Ambulatory Visit (INDEPENDENT_AMBULATORY_CARE_PROVIDER_SITE_OTHER): Payer: BLUE CROSS/BLUE SHIELD | Admitting: Internal Medicine

## 2017-07-14 ENCOUNTER — Encounter: Payer: Self-pay | Admitting: Internal Medicine

## 2017-07-14 VITALS — BP 124/82 | HR 79 | Temp 98.4°F | Ht 67.0 in | Wt 356.0 lb

## 2017-07-14 DIAGNOSIS — Z0000 Encounter for general adult medical examination without abnormal findings: Secondary | ICD-10-CM | POA: Diagnosis not present

## 2017-07-14 DIAGNOSIS — F419 Anxiety disorder, unspecified: Secondary | ICD-10-CM | POA: Diagnosis not present

## 2017-07-14 DIAGNOSIS — E559 Vitamin D deficiency, unspecified: Secondary | ICD-10-CM | POA: Diagnosis not present

## 2017-07-14 DIAGNOSIS — Z9884 Bariatric surgery status: Secondary | ICD-10-CM

## 2017-07-14 DIAGNOSIS — R0683 Snoring: Secondary | ICD-10-CM

## 2017-07-14 MED ORDER — HYDROCODONE-ACETAMINOPHEN 5-325 MG PO TABS: 1.0000 | ORAL_TABLET | Freq: Four times a day (QID) | ORAL | 0 refills | Status: DC | PRN

## 2017-07-14 MED ORDER — ALPRAZOLAM 1 MG PO TABS
ORAL_TABLET | ORAL | 2 refills | Status: DC
Start: 2017-07-14 — End: 2017-10-13

## 2017-07-14 MED ORDER — PANTOPRAZOLE SODIUM 40 MG PO TBEC: 40.0000 mg | DELAYED_RELEASE_TABLET | Freq: Every day | ORAL | 3 refills | Status: DC

## 2017-07-14 NOTE — Assessment & Plan Note (Signed)
Xanax prn  Potential benefits of a long term benzodiazepines  use as well as potential risks  and complications were explained to the patient and were aknowledged.

## 2017-07-14 NOTE — Assessment & Plan Note (Signed)
On Vit D 

## 2017-07-14 NOTE — Progress Notes (Signed)
Subjective:  Patient ID: Stephanie Harper, female    DOB: 02-28-1970  Age: 47 y.o. MRN: [id number]  CC: No chief complaint on file.   HPI Stephanie Harper presents for chronic pain, anxiety C/o fatigue, OSA sx's Down to 5 cigs a day  C/o OSA sx's    Outpatient Medications Prior to Visit  Medication Sig Dispense Refill  . albuterol (VENTOLIN HFA) 108 (90 Base) MCG/ACT inhaler Inhale 2 puffs into the lungs every 6 (six) hours as needed. 1 Inhaler 5  . ALPRAZolam (XANAX) 1 MG tablet TAKE 1 TABLET 3 TIMES A DAY AS NEEDED FOR ANXIETY OR SLEEP 90 tablet 2  . BABY ASPIRIN PO     . buPROPion (WELLBUTRIN SR) 100 MG 12 hr tablet TAKE 1 TABLET TWICE A DAY 180 tablet 1  . Cholecalciferol 1000 UNITS tablet Take 2 tablets (2,000 Units total) by mouth daily. 100 tablet 3  . clobetasol cream (TEMOVATE) 0.05 % APPLY TO LEG UNDER MOIST COMPRESS AT BEDTIME (NOT FOR FACE OR FOLDS) 60 g 2  . fluticasone furoate-vilanterol (BREO ELLIPTA) 100-25 MCG/INH AEPB Inhale 1 puff into the lungs daily. 180 each 2  . furosemide (LASIX) 40 MG tablet Take 1 tablet (40 mg total) by mouth daily as needed. 30 tablet 5  . HYDROcodone-acetaminophen (NORCO/VICODIN) 5-325 MG tablet Take 1 tablet by mouth every 6 (six) hours as needed for moderate pain or severe pain. 120 tablet 0  . ibuprofen (ADVIL,MOTRIN) 600 MG tablet Take 1 tablet (600 mg total) by mouth 2 (two) times daily as needed. 180 tablet 3  . Multiple Vitamin (MULTIVITAMIN) tablet Take 1 tablet by mouth 2 (two) times daily.     . niacinamide 500 MG tablet     . PARoxetine (PAXIL) 10 MG tablet TAKE 1 TABLET EVERY DAY 90 tablet 0  . pentoxifylline (TRENTAL) 400 MG CR tablet Take by mouth.    . vitamin B-12 (CYANOCOBALAMIN) 1000 MCG tablet Take 1,000 mcg by mouth daily.     . Vitamin D, Ergocalciferol, (DRISDOL) 50000 units CAPS capsule TAKE ONE CAPSULE ONCE A WEEK 4 capsule 5   No facility-administered medications prior to visit.     ROS: Review of  Systems  Constitutional: Positive for fatigue. Negative for activity change, appetite change, chills and unexpected weight change.  HENT: Positive for congestion and postnasal drip. Negative for mouth sores and sinus pressure.   Eyes: Negative for visual disturbance.  Respiratory: Negative for cough and chest tightness.   Gastrointestinal: Negative for abdominal pain and nausea.  Genitourinary: Negative for difficulty urinating, frequency and vaginal pain.  Musculoskeletal: Positive for arthralgias and back pain. Negative for gait problem.  Skin: Negative for pallor and rash.  Neurological: Negative for dizziness, tremors, weakness, numbness and headaches.  Psychiatric/Behavioral: Negative for confusion, sleep disturbance and suicidal ideas. The patient is nervous/anxious.     Objective:  BP 124/82 (BP Location: Left Arm, Patient Position: Sitting, Cuff Size: Large)   Pulse 79   Temp 98.4 F (36.9 C) (Oral)   Ht 5' 7"  (1.702 m)   Wt (!) 356 lb (161.5 kg)   SpO2 98%   BMI 55.76 kg/m   BP Readings from Last 3 Encounters:  07/14/17 124/82  04/14/17 128/82  01/20/17 126/78    Wt Readings from Last 3 Encounters:  07/14/17 (!) 356 lb (161.5 kg)  04/14/17 (!) 357 lb (161.9 kg)  01/20/17 (!) 358 lb (162.4 kg)    Physical Exam  Constitutional: She appears well-developed. No  distress.  HENT:  Head: Normocephalic.  Right Ear: External ear normal.  Left Ear: External ear normal.  Nose: Nose normal.  Mouth/Throat: Oropharynx is clear and moist.  Eyes: Pupils are equal, round, and reactive to light. Conjunctivae are normal. Right eye exhibits no discharge. Left eye exhibits no discharge.  Neck: Normal range of motion. Neck supple. No JVD present. No tracheal deviation present. No thyromegaly present.  Cardiovascular: Normal rate, regular rhythm and normal heart sounds.  Pulmonary/Chest: No stridor. No respiratory distress. She has no wheezes.  Abdominal: Soft. Bowel sounds are  normal. She exhibits no distension and no mass. There is no tenderness. There is no rebound and no guarding.  Musculoskeletal: She exhibits no edema or tenderness.  Lymphadenopathy:    She has no cervical adenopathy.  Neurological: She displays normal reflexes. No cranial nerve deficit. She exhibits normal muscle tone. Coordination normal.  Skin: No rash noted. No erythema.  Psychiatric: She has a normal mood and affect. Her behavior is normal. Judgment and thought content normal.  obese  Lab Results  Component Value Date   WBC 10.5 10/18/2016   HGB 14.5 10/18/2016   HCT 43.5 10/18/2016   PLT 324.0 10/18/2016   GLUCOSE 114 (H) 07/22/2016   CHOL 169 01/19/2016   TRIG 68.0 01/19/2016   HDL 44.70 01/19/2016   LDLCALC 110 (H) 01/19/2016   ALT 13 07/22/2016   AST 12 07/22/2016   NA 140 07/22/2016   K 4.0 07/22/2016   CL 103 07/22/2016   CREATININE 0.58 07/22/2016   BUN 11 07/22/2016   CO2 31 07/22/2016   TSH 2.60 01/19/2016   INR 1.0 08/09/2007   HGBA1C 5.7 01/19/2016    Dg Chest 2 View  Result Date: 01/19/2016 CLINICAL DATA:  Productive cough . EXAM: CHEST  2 VIEW COMPARISON:  06/26/2011 . FINDINGS: Mediastinum and hilar structures normal. Cardiomegaly with normal pulmonary vascularity. No pleural effusion or pneumothorax. Postsurgical changes both shoulders. IMPRESSION: No acute cardiopulmonary disease. Electronically Signed   By: Marcello Moores  Register   On: 01/19/2016 14:42    Assessment & Plan:   There are no diagnoses linked to this encounter.   No orders of the defined types were placed in this encounter.    Follow-up: No follow-ups on file.  Walker Kehr, MD

## 2017-07-17 ENCOUNTER — Encounter: Payer: Self-pay | Admitting: Internal Medicine

## 2017-07-17 DIAGNOSIS — L921 Necrobiosis lipoidica, not elsewhere classified: Secondary | ICD-10-CM | POA: Diagnosis not present

## 2017-07-20 ENCOUNTER — Other Ambulatory Visit: Payer: Self-pay | Admitting: Internal Medicine

## 2017-07-29 ENCOUNTER — Encounter: Payer: Self-pay | Admitting: Internal Medicine

## 2017-07-30 DIAGNOSIS — Z4651 Encounter for fitting and adjustment of gastric lap band: Secondary | ICD-10-CM | POA: Diagnosis not present

## 2017-08-12 ENCOUNTER — Encounter: Payer: Self-pay | Admitting: Pulmonary Disease

## 2017-08-12 ENCOUNTER — Ambulatory Visit (INDEPENDENT_AMBULATORY_CARE_PROVIDER_SITE_OTHER): Payer: BLUE CROSS/BLUE SHIELD | Admitting: Pulmonary Disease

## 2017-08-12 VITALS — BP 138/82 | HR 108 | Ht 67.0 in | Wt 358.0 lb

## 2017-08-12 DIAGNOSIS — G4733 Obstructive sleep apnea (adult) (pediatric): Secondary | ICD-10-CM

## 2017-08-12 NOTE — Patient Instructions (Signed)
High probability moderately severe OSA  Hypersomnia  History of depression  Severe musculoskeletal pain and discomfort   Obtain a split-night study  Continue to work on your weight loss  I will see you back in the office in 2 months following the study

## 2017-08-12 NOTE — Progress Notes (Signed)
Stephanie Harper    [id number]    05-27-1970  Primary Care Physician:Plotnikov, Evie Lacks, MD  Referring Physician: Cassandria Anger, MD [place], [place] [address]  Chief complaint:   Patient with a history of snoring, hypersomnia, nonrestorative sleep  HPI:  History of morbid obesity for which she had a LAP-BAND.  Lost a lot of weight but gained about half of it back recently  Extubated about 10-11, will occasionally wake up to help get him off for work And then she will go back to bed sleeps to about 7 Wakes up with a dry throat Has been told more recently by her spouse that she snores, she is also does have nasal stuffiness  Has severe musculoskeletal pain and discomfort  She is an active smoker  Pets: Dogs, chickens Occupation: Homemaker  Exposures: No exposure to mold Smoking history: An active smoker Travel history: Recent travels Relevant family history: No contributory history  Outpatient Encounter Medications as of 08/12/2017  Medication Sig  . albuterol (VENTOLIN HFA) 108 (90 Base) MCG/ACT inhaler Inhale 2 puffs into the lungs every 6 (six) hours as needed.  . ALPRAZolam (XANAX) 1 MG tablet TAKE 1 TABLET 3 TIMES A DAY AS NEEDED FOR ANXIETY OR SLEEP  . buPROPion (WELLBUTRIN SR) 100 MG 12 hr tablet TAKE 1 TABLET TWICE A DAY  . Cholecalciferol 1000 UNITS tablet Take 2 tablets (2,000 Units total) by mouth daily.  . fluticasone furoate-vilanterol (BREO ELLIPTA) 100-25 MCG/INH AEPB Inhale 1 puff into the lungs daily.  . furosemide (LASIX) 40 MG tablet Take 1 tablet (40 mg total) by mouth daily as needed.  [name] HYDROcodone-acetaminophen (NORCO/VICODIN) 5-325 MG tablet Take 1 tablet by mouth every 6 (six) hours as needed for moderate pain or severe pain.  [name] ibuprofen (ADVIL,MOTRIN) 600 MG tablet Take 1 tablet (600 mg total) by mouth 2 (two) times daily as needed.  . Multiple Vitamin (MULTIVITAMIN) tablet Take 1 tablet by mouth 2 (two) times daily.     . niacinamide 500 MG tablet   . pantoprazole (PROTONIX) 40 MG tablet Take 1 tablet (40 mg total) by mouth daily.  [name] PARoxetine (PAXIL) 10 MG tablet TAKE 1 TABLET EVERY DAY  . pentoxifylline (TRENTAL) 400 MG CR tablet Take by mouth.  . vitamin B-12 (CYANOCOBALAMIN) 1000 MCG tablet Take 1,000 mcg by mouth daily.   . Vitamin D, Ergocalciferol, (DRISDOL) 50000 units CAPS capsule TAKE ONE CAPSULE ONCE A WEEK  . [DISCONTINUED] BABY ASPIRIN PO   . [DISCONTINUED] clobetasol cream (TEMOVATE) 0.05 % APPLY TO LEG UNDER MOIST COMPRESS AT BEDTIME (NOT FOR FACE OR FOLDS)   No facility-administered encounter medications on file as of 08/12/2017.     Allergies as of 08/12/2017 - Review Complete 08/12/2017  Allergen Reaction Noted  . Adhesive [tape] Other (See Comments) 01/07/2012    Past Medical History:  Diagnosis Date  . Anal fissure 2012   Dr Ardis Hughs  . Anxiety   . Asthma    COLD WEATHER RELATED  . Baker's cyst of knee    Right knee  . Complication of anesthesia    PANIC ATTACK RIGHT BEFORE SHOULDER REPLACMENT SURGERY AT [facility]; PT STATES CLAUSTROPHOBIA AND DOES NOT WANT TO BE AWARE OF BEING STRAPPED DOWN IN OR  . Depression   . HTN (hypertension)   . LBP (low back pain)   . Obesity   . Osteomyelitis ([place])    at [facility]  . Pain  HX OF BILATERAL SHOULDER SURGERIES-CHRONIC SHOULDER PAIN ;  HX OF BILATERAL KNEE PAIN  . Sepsis(995.91) 2005   From shoulder replacement surgery  . Shoulder fracture, left     Past Surgical History:  Procedure Laterality Date  . APPENDECTOMY    . BREATH TEK H PYLORI  07/15/2011   Procedure: BREATH TEK H PYLORI;  Surgeon: Pedro Earls, MD;  Location: [facility];  Service: General;  Laterality: N/A;  . CHOLECYSTECTOMY    . LAPAROSCOPIC GASTRIC BANDING  01/21/2012   Procedure: LAPAROSCOPIC GASTRIC BANDING;  Surgeon: Pedro Earls, MD;  Location: [facility];  Service: General;  Laterality: N/A;  . LASER ABLATION    . MESH APPLIED TO LAP PORT  01/21/2012    Procedure: MESH APPLIED TO LAP PORT;  Surgeon: Pedro Earls, MD;  Location: [facility];  Service: General;;  . PLANTAR FASCIA RELEASE  1996  . TONSILLECTOMY    . TOTAL SHOULDER REPLACEMENT     Right  . TUBAL LIGATION     UTERINE ABLATION WAS DONE AT THE TIME OF TUBAL LIGATION    Family History  Problem Relation Age of Onset  . Arthritis Mother   . Diabetes Mother   . Hyperlipidemia Mother   . Hypertension Mother   . Arthritis Father   . Diabetes Father   . Hyperlipidemia Father   . Hypertension Father   . Heart disease Father   . Coronary artery disease Other   . Hyperlipidemia Other   . Hypertension Other   . Bipolar disorder Brother   . Anxiety disorder Neg Hx   . Depression Neg Hx   . Dementia Neg Hx   . Alcohol abuse Neg Hx   . Drug abuse Neg Hx   . Schizophrenia Neg Hx     Social History   Socioeconomic History  . Marital status: Married    Spouse name: Not on file  . Number of children: Not on file  . Years of education: Not on file  . Highest education level: Not on file  Occupational History  . Occupation: Arboriculturist: UNEMPLOYED  Social Needs  . Financial resource strain: Not on file  . Food insecurity:    Worry: Not on file    Inability: Not on file  . Transportation needs:    Medical: Not on file    Non-medical: Not on file  Tobacco Use  . Smoking status: Current Every Day Smoker    Packs/day: 1.00    Years: 31.00    Pack years: 31.00    Types: Cigarettes    Start date: 08/12/1980  . Smokeless tobacco: Never Used  . Tobacco comment: WAS SMOKING 2 PPD-HAS CUT BACK TO 1 PPD OR LESS  Substance and Sexual Activity  . Alcohol use: No  . Drug use: No  . Sexual activity: Yes  Lifestyle  . Physical activity:    Days per week: Not on file    Minutes per session: Not on file  . Stress: Not on file  Relationships  . Social connections:    Talks on phone: Not on file    Gets together: Not on file    Attends religious service: Not on file     Active member of club or organization: Not on file    Attends meetings of clubs or organizations: Not on file    Relationship status: Not on file  . Intimate partner violence:    Fear of current or ex partner: Not  on file    Emotionally abused: Not on file    Physically abused: Not on file    Forced sexual activity: Not on file  Other Topics Concern  . Not on file  Social History Narrative  . Not on file    Review of systems: Review of Systems  Constitutional: Negative for fever and chills.  HENT: Negative.    Eyes: Negative for blurred vision.  Respiratory: as per HPI , no shortness of breath at rest, limited activities Cardiovascular: Negative for chest pain and palpitations.  Gastrointestinal: Negative for vomiting, diarrhea, blood per rectum. Genitourinary: Negative for dysuria, urgency, frequency and hematuria.  Musculoskeletal: Significant musculoskeletal pain and discomfort Skin: Negative for itching and rash.  Neurological: Negative for dizziness, tremors, focal weakness, seizures and loss of consciousness.  Endo/Heme/Allergies: Negative for environmental allergies.  Psychiatric/Behavioral: Negative for depression, suicidal ideas and hallucinations.  All other systems reviewed and are negative.  Physical Exam: Blood pressure 138/82, pulse (!) 108, height 5' 7"  (1.702 m), weight (!) 358 lb (162.4 kg), SpO2 97 %. Gen:      No acute distress, morbid obesity HEENT:  EOMI, sclera anicteric , crowded oropharynx, Mallampati of 4 Neck:     No masses; no thyromegaly Lungs:    Clear to auscultation bilaterally; normal respiratory effort CV:         Regular rate and rhythm; no murmurs Abd:      + bowel sounds; soft, non-tender; no palpable masses, no distension Ext:    leg edema Skin:      Warm and dry; no rash Neuro: alert and oriented x 3 Psych: normal mood and affect  Data Reviewed: No significant lab data available  Assessment:  High probability of moderate to  severe obstructive sleep apnea  Hypersomnia  Active smoker  History of depression   Plan/Recommendations:  We will schedule for split-night study  Encouraged to continue working on weight loss  Smoking cessation counseling  We will see her back in the office in 2 months  Pathophysiology of sleep disordered breathing was discussed  Options of treatment including CPAP, oral device, surgical interventions discussed     Sherrilyn Rist MD [facility] 08/12/2017, 3:28 PM  CC: Plotnikov, Evie Lacks, MD

## 2017-08-19 ENCOUNTER — Ambulatory Visit: Payer: BLUE CROSS/BLUE SHIELD | Attending: Pulmonary Disease | Admitting: Pulmonary Disease

## 2017-08-19 DIAGNOSIS — G4736 Sleep related hypoventilation in conditions classified elsewhere: Secondary | ICD-10-CM | POA: Insufficient documentation

## 2017-08-19 DIAGNOSIS — G4733 Obstructive sleep apnea (adult) (pediatric): Secondary | ICD-10-CM

## 2017-08-30 ENCOUNTER — Other Ambulatory Visit: Payer: Self-pay | Admitting: Internal Medicine

## 2017-09-03 ENCOUNTER — Other Ambulatory Visit: Payer: Self-pay | Admitting: Internal Medicine

## 2017-09-03 NOTE — Telephone Encounter (Signed)
A.O. Please advise on results. Thanks.

## 2017-09-04 ENCOUNTER — Telehealth: Payer: Self-pay | Admitting: Pulmonary Disease

## 2017-09-04 NOTE — Telephone Encounter (Signed)
Patient updated about sleep study results

## 2017-09-04 NOTE — Procedures (Signed)
POLYSOMNOGRAPHY  Last, First: Stephanie Harper, Stephanie Harper MRN: [id number] Gender: Female Age (years): 89 Weight (lbs): 358 DOB: 08-Mar-1970 BMI: 56 Primary Care: No PCP Epworth Score: 6 Referring: Laurin Coder MD Technician: Rosebud Poles Interpreting: Laurin Coder MD Study Type: NPSG Ordered Study Type: Split Night CPAP Study date: 08/19/2017 Location: [facility] CLINICAL INFORMATION Stephanie Harper is a 47 year old Female and was referred to the sleep center for evaluation of N/A. Indications include OSA.  MEDICATIONS Patient self administered medications include: N/A. Medications administered during study include No sleep medicine administered.  SLEEP STUDY TECHNIQUE A multi-channel overnight Polysomnography study was performed. The channels recorded and monitored were central and occipital EEG, electrooculogram (EOG), submentalis EMG (chin), nasal and oral airflow, thoracic and abdominal wall motion, anterior tibialis EMG, snore microphone, electrocardiogram, and a pulse oximetry. TECHNICIAN COMMENTS Comments added by Technician: Moderate to loud snoring throughout study. Patient did not meet Spllit night protocol due to an AHI of 6.7/hr within 2 hrs of TST. Increased EMG, chin arousals noticed at times due to significant snoring events. Wheezing sounds noticed all through out study Comments added by Scorer: N/A SLEEP ARCHITECTURE The study was initiated at 10:14:46 PM and terminated at 5:03:50 AM. The total recorded time was 409.1 minutes. EEG confirmed total sleep time was 357.5 minutes yielding a sleep efficiency of 87.4%%. Sleep onset after lights out was 23.3 minutes with a REM latency of 351.5 minutes. The patient spent 6.6%% of the night in stage N1 sleep, 61.7%% in stage N2 sleep, 22.2%% in stage N3 and 9.5% in REM. Wake after sleep onset (WASO) was 28.3 minutes. The Arousal Index was 9.1/hour. RESPIRATORY PARAMETERS There were a total of 20 respiratory disturbances out  of which 2 were apneas ( 1 obstructive, 1 mixed, 0 central) and 18 hypopneas. The apnea/hypopnea index (AHI) was 3.4 events/hour. The central sleep apnea index was 0.0 events/hour. The REM AHI was 14.1 events/hour and NREM AHI was 2.2 events/hour. The supine AHI was 3.0 events/hour and the non supine AHI was 4.1 supine during 67.41% of sleep. Respiratory disturbances were associated with oxygen desaturation down to a nadir of 84.0% during sleep. The mean oxygen saturation during the study was 91.5%. The cumulative time under 88% oxygen saturation was 5.5 minutes.  LEG MOVEMENT DATA The total leg movements were 0 with a resulting leg movement index of 0.0/hr .Associated arousal with leg movement index was 0.0/hr.  CARDIAC DATA The underlying cardiac rhythm was most consistent with sinus rhythm. Mean heart rate during sleep was 75.9 bpm. Additional rhythm abnormalities include None. IMPRESSIONS - No Significant Obstructive Sleep apnea(OSA) - EKG showed no cardiac abnormalities. - Mild Oxygen Desaturation - The patient snored with loud snoring volume. - No significant periodic leg movements(PLMs) during sleep. However, no significant associated arousals. DIAGNOSIS - Nocturnal Hypoxemia (327.26 [G47.36 ICD-10]) RECOMMENDATIONS - Avoid alcohol, sedatives and other CNS depressants that may worsen sleep apnea and disrupt normal sleep architecture. - Sleep hygiene should be reviewed to assess factors that may improve sleep quality. - Weight management and regular exercise should be initiated or continued.  [Electronically signed] 09/04/2017 10:16 AM  Sherrilyn Rist MD NPI: [id number]

## 2017-09-05 DIAGNOSIS — L921 Necrobiosis lipoidica, not elsewhere classified: Secondary | ICD-10-CM | POA: Diagnosis not present

## 2017-10-03 NOTE — Addendum Note (Signed)
Addended by: Karren Cobble on: 10/03/2017 01:12 PM   Modules accepted: Orders

## 2017-10-06 ENCOUNTER — Other Ambulatory Visit: Payer: Self-pay | Admitting: Internal Medicine

## 2017-10-06 DIAGNOSIS — E559 Vitamin D deficiency, unspecified: Secondary | ICD-10-CM | POA: Diagnosis not present

## 2017-10-06 DIAGNOSIS — R0683 Snoring: Secondary | ICD-10-CM | POA: Diagnosis not present

## 2017-10-06 DIAGNOSIS — Z7984 Long term (current) use of oral hypoglycemic drugs: Secondary | ICD-10-CM | POA: Diagnosis not present

## 2017-10-06 DIAGNOSIS — Z9884 Bariatric surgery status: Secondary | ICD-10-CM | POA: Diagnosis not present

## 2017-10-07 LAB — URINALYSIS, COMPLETE
Bilirubin Urine: NEGATIVE
GLUCOSE, UA: NEGATIVE
Hgb urine dipstick: NEGATIVE
Hyaline Cast: NONE SEEN /LPF
Ketones, ur: NEGATIVE
LEUKOCYTES UA: NEGATIVE
Nitrite: NEGATIVE
PROTEIN: NEGATIVE
Specific Gravity, Urine: 1.019 (ref 1.001–1.03)
pH: 8 (ref 5.0–8.0)

## 2017-10-07 LAB — HEPATIC FUNCTION PANEL
AG Ratio: 1.4 (calc) (ref 1.0–2.5)
ALKALINE PHOSPHATASE (APISO): 76 U/L (ref 33–115)
ALT: 14 U/L (ref 6–29)
AST: 13 U/L (ref 10–35)
Albumin: 3.6 g/dL (ref 3.6–5.1)
BILIRUBIN INDIRECT: 0.4 mg/dL (ref 0.2–1.2)
BILIRUBIN TOTAL: 0.5 mg/dL (ref 0.2–1.2)
Bilirubin, Direct: 0.1 mg/dL (ref 0.0–0.2)
Globulin: 2.6 g/dL (calc) (ref 1.9–3.7)
Total Protein: 6.2 g/dL (ref 6.1–8.1)

## 2017-10-07 LAB — BASIC METABOLIC PANEL WITH GFR
BUN: 9 mg/dL (ref 7–25)
CALCIUM: 8.8 mg/dL (ref 8.6–10.2)
CO2: 28 mmol/L (ref 20–32)
Chloride: 104 mmol/L (ref 98–110)
Creat: 0.57 mg/dL (ref 0.50–1.10)
GFR, EST AFRICAN AMERICAN: 129 mL/min/{1.73_m2} (ref >=60)
GFR, Est Non African American: 111 mL/min/{1.73_m2} (ref >=60)
GLUCOSE: 102 mg/dL — AB (ref 65–99)
Potassium: 4.4 mmol/L (ref 3.5–5.3)
SODIUM: 140 mmol/L (ref 135–146)

## 2017-10-07 LAB — VITAMIN D 25 HYDROXY (VIT D DEFICIENCY, FRACTURES): Vit D, 25-Hydroxy: 51 ng/mL (ref 30–100)

## 2017-10-07 LAB — CBC WITH DIFFERENTIAL/PLATELET
BASOS PCT: 0.4 %
Basophils Absolute: 38 cells/uL (ref 0–200)
EOS ABS: 106 {cells}/uL (ref 15–500)
Eosinophils Relative: 1.1 %
HEMATOCRIT: 40.1 % (ref 35.0–45.0)
HEMOGLOBIN: 13.7 g/dL (ref 11.7–15.5)
Lymphs Abs: 1834 cells/uL (ref 850–3900)
MCH: 29.9 pg (ref 27.0–33.0)
MCHC: 34.2 g/dL (ref 32.0–36.0)
MCV: 87.6 fL (ref 80.0–100.0)
MPV: 10 fL (ref 7.5–12.5)
Monocytes Relative: 8.6 %
NEUTROS PCT: 70.8 %
Neutro Abs: 6797 cells/uL (ref 1500–7800)
Platelets: 298 10*3/uL (ref 140–400)
RBC: 4.58 10*6/uL (ref 3.80–5.10)
RDW: 12.8 % (ref 11.0–15.0)
Total Lymphocyte: 19.1 %
WBC: 9.6 10*3/uL (ref 3.8–10.8)
WBCMIX: 826 {cells}/uL (ref 200–950)

## 2017-10-07 LAB — LIPID PANEL
CHOL/HDL RATIO: 4.1 (calc) (ref <5.0)
CHOLESTEROL: 172 mg/dL (ref <200)
HDL: 42 mg/dL — AB (ref >50)
LDL Cholesterol (Calc): 109 mg/dL (calc) — ABNORMAL HIGH
Non-HDL Cholesterol (Calc): 130 mg/dL (calc) — ABNORMAL HIGH (ref <130)
TRIGLYCERIDES: 103 mg/dL (ref <150)

## 2017-10-07 LAB — TSH: TSH: 2.1 mIU/L

## 2017-10-09 ENCOUNTER — Other Ambulatory Visit: Payer: Self-pay | Admitting: Internal Medicine

## 2017-10-13 ENCOUNTER — Encounter: Payer: Self-pay | Admitting: Internal Medicine

## 2017-10-13 ENCOUNTER — Ambulatory Visit (INDEPENDENT_AMBULATORY_CARE_PROVIDER_SITE_OTHER): Payer: BLUE CROSS/BLUE SHIELD | Admitting: Internal Medicine

## 2017-10-13 VITALS — BP 128/72 | HR 85 | Temp 98.6°F | Ht 67.0 in | Wt 367.0 lb

## 2017-10-13 DIAGNOSIS — M5440 Lumbago with sciatica, unspecified side: Secondary | ICD-10-CM

## 2017-10-13 DIAGNOSIS — G8929 Other chronic pain: Secondary | ICD-10-CM | POA: Diagnosis not present

## 2017-10-13 DIAGNOSIS — E559 Vitamin D deficiency, unspecified: Secondary | ICD-10-CM | POA: Diagnosis not present

## 2017-10-13 DIAGNOSIS — Z23 Encounter for immunization: Secondary | ICD-10-CM

## 2017-10-13 DIAGNOSIS — F410 Panic disorder [episodic paroxysmal anxiety] without agoraphobia: Secondary | ICD-10-CM

## 2017-10-13 DIAGNOSIS — I10 Essential (primary) hypertension: Secondary | ICD-10-CM | POA: Diagnosis not present

## 2017-10-13 MED ORDER — HYDROCODONE-ACETAMINOPHEN 5-325 MG PO TABS: 1.0000 | ORAL_TABLET | Freq: Four times a day (QID) | ORAL | 0 refills | Status: DC | PRN

## 2017-10-13 MED ORDER — ALPRAZOLAM 1 MG PO TABS: ORAL_TABLET | ORAL | 2 refills | Status: DC

## 2017-10-13 NOTE — Assessment & Plan Note (Signed)
Norco prn  Potential benefits of a long term opioids use as well as potential risks (i.e. addiction risk, apnea etc) and complications (i.e. Somnolence, constipation and others) were explained to the patient and were aknowledged.

## 2017-10-13 NOTE — Progress Notes (Signed)
Subjective:  Patient ID: Stephanie Harper, female    DOB: [birth year]  Age: 47 y.o. MRN: [id number]  CC: No chief complaint on file.   HPI Stephanie Harper presents for LBP, anxiety, night sweats f/u  Outpatient Medications Prior to Visit  Medication Sig Dispense Refill  . albuterol (VENTOLIN HFA) 108 (90 Base) MCG/ACT inhaler Inhale 2 puffs into the lungs every 6 (six) hours as needed. 1 Inhaler 5  . ALPRAZolam (XANAX) 1 MG tablet TAKE 1 TABLET 3 TIMES A DAY AS NEEDED FOR ANXIETY OR SLEEP 90 tablet 2  . buPROPion (WELLBUTRIN SR) 100 MG 12 hr tablet TAKE 1 TABLET BY MOUTH TWICE A DAY 180 tablet 1  . Cholecalciferol 1000 UNITS tablet Take 2 tablets (2,000 Units total) by mouth daily. 100 tablet 3  . fluticasone furoate-vilanterol (BREO ELLIPTA) 100-25 MCG/INH AEPB Inhale 1 puff into the lungs daily. 180 each 2  . furosemide (LASIX) 40 MG tablet TAKE 1 TABLET (40 MG TOTAL) BY MOUTH DAILY AS NEEDED. 90 tablet 3  . HYDROcodone-acetaminophen (NORCO/VICODIN) 5-325 MG tablet Take 1 tablet by mouth every 6 (six) hours as needed for moderate pain or severe pain. 120 tablet 0  . ibuprofen (ADVIL,MOTRIN) 600 MG tablet Take 1 tablet (600 mg total) by mouth 2 (two) times daily as needed. 180 tablet 3  . Multiple Vitamin (MULTIVITAMIN) tablet Take 1 tablet by mouth 2 (two) times daily.     . niacinamide 500 MG tablet     . PARoxetine (PAXIL) 10 MG tablet TAKE 1 TABLET EVERY DAY 90 tablet 3  . pentoxifylline (TRENTAL) 400 MG CR tablet Take by mouth.    . vitamin B-12 (CYANOCOBALAMIN) 1000 MCG tablet Take 1,000 mcg by mouth daily.     . Vitamin D, Ergocalciferol, (DRISDOL) 50000 units CAPS capsule TAKE ONE CAPSULE ONCE A WEEK 4 capsule 5  . pantoprazole (PROTONIX) 40 MG tablet TAKE 1 TABLET BY MOUTH EVERY DAY 90 tablet 1   No facility-administered medications prior to visit.     ROS: Review of Systems  Constitutional: Positive for unexpected weight change. Negative for activity change, appetite  change, chills and fatigue.  HENT: Negative for congestion, mouth sores and sinus pressure.   Eyes: Negative for visual disturbance.  Respiratory: Negative for cough and chest tightness.   Gastrointestinal: Negative for abdominal pain and nausea.  Genitourinary: Negative for difficulty urinating, frequency and vaginal pain.  Musculoskeletal: Positive for arthralgias and back pain. Negative for gait problem.  Skin: Negative for pallor and rash.  Neurological: Negative for dizziness, tremors, weakness, numbness and headaches.  Psychiatric/Behavioral: Negative for confusion and sleep disturbance.    Objective:  BP 128/72 (BP Location: Left Arm, Patient Position: Sitting, Cuff Size: Large)   Pulse 85   Temp 98.6 F (37 C) (Oral)   Ht 5' 7"  (1.702 m)   Wt (!) 367 lb (166.5 kg)   SpO2 96%   BMI 57.48 kg/m   BP Readings from Last 3 Encounters:  10/13/17 128/72  08/12/17 138/82  07/14/17 124/82    Wt Readings from Last 3 Encounters:  10/13/17 (!) 367 lb (166.5 kg)  08/12/17 (!) 358 lb (162.4 kg)  07/14/17 (!) 356 lb (161.5 kg)    Physical Exam  Constitutional: She appears well-developed. No distress.  HENT:  Head: Normocephalic.  Right Ear: External ear normal.  Left Ear: External ear normal.  Nose: Nose normal.  Mouth/Throat: Oropharynx is clear and moist.  Eyes: Pupils are equal, round, and reactive to  light. Conjunctivae are normal. Right eye exhibits no discharge. Left eye exhibits no discharge.  Neck: Normal range of motion. Neck supple. No JVD present. No tracheal deviation present. No thyromegaly present.  Cardiovascular: Normal rate, regular rhythm and normal heart sounds.  Pulmonary/Chest: No stridor. No respiratory distress. She has no wheezes.  Abdominal: Soft. Bowel sounds are normal. She exhibits no distension and no mass. There is no tenderness. There is no rebound and no guarding.  Musculoskeletal: She exhibits no edema or tenderness.  Lymphadenopathy:    She  has no cervical adenopathy.  Neurological: She displays normal reflexes. No cranial nerve deficit. She exhibits normal muscle tone. Coordination normal.  Skin: No rash noted. No erythema.  Psychiatric: She has a normal mood and affect. Her behavior is normal. Judgment and thought content normal.  eryth lower legs  Lab Results  Component Value Date   WBC 9.6 10/06/2017   HGB 13.7 10/06/2017   HCT 40.1 10/06/2017   PLT 298 10/06/2017   GLUCOSE 102 (H) 10/06/2017   CHOL 172 10/06/2017   TRIG 103 10/06/2017   HDL 42 (L) 10/06/2017   LDLCALC 109 (H) 10/06/2017   ALT 14 10/06/2017   AST 13 10/06/2017   NA 140 10/06/2017   K 4.4 10/06/2017   CL 104 10/06/2017   CREATININE 0.57 10/06/2017   BUN 9 10/06/2017   CO2 28 10/06/2017   TSH 2.10 10/06/2017   INR 1.0 08/09/2007   HGBA1C 5.7 01/19/2016    Dg Chest 2 View  Result Date: 01/19/2016 CLINICAL DATA:  Productive cough . EXAM: CHEST  2 VIEW COMPARISON:  06/26/2011 . FINDINGS: Mediastinum and hilar structures normal. Cardiomegaly with normal pulmonary vascularity. No pleural effusion or pneumothorax. Postsurgical changes both shoulders. IMPRESSION: No acute cardiopulmonary disease. Electronically Signed   By: Marcello Moores  Register   On: 01/19/2016 14:42    Assessment & Plan:   There are no diagnoses linked to this encounter.   No orders of the defined types were placed in this encounter.    Follow-up: No follow-ups on file.  Walker Kehr, MD

## 2017-10-13 NOTE — Assessment & Plan Note (Signed)
BP Readings from Last 3 Encounters:  10/13/17 128/72  08/12/17 138/82  07/14/17 124/82

## 2017-10-13 NOTE — Addendum Note (Signed)
Addended by: Karren Cobble on: 10/13/2017 02:28 PM   Modules accepted: Orders

## 2017-10-13 NOTE — Assessment & Plan Note (Signed)
Vit D 

## 2017-10-13 NOTE — Assessment & Plan Note (Signed)
Xanax prn  Potential benefits of a long term benzodiazepines  use as well as potential risks  and complications were explained to the patient and were aknowledged.

## 2017-10-16 DIAGNOSIS — L921 Necrobiosis lipoidica, not elsewhere classified: Secondary | ICD-10-CM | POA: Diagnosis not present

## 2017-10-27 ENCOUNTER — Other Ambulatory Visit: Payer: Self-pay | Admitting: Internal Medicine

## 2017-12-02 IMAGING — DX DG CHEST 2V
2 series · 2 of 2 positions shown · non-contrast
Comparison: 06/26/2011 .

CLINICAL DATA: Productive cough .

EXAM:
CHEST  2 VIEW

[chest pa]
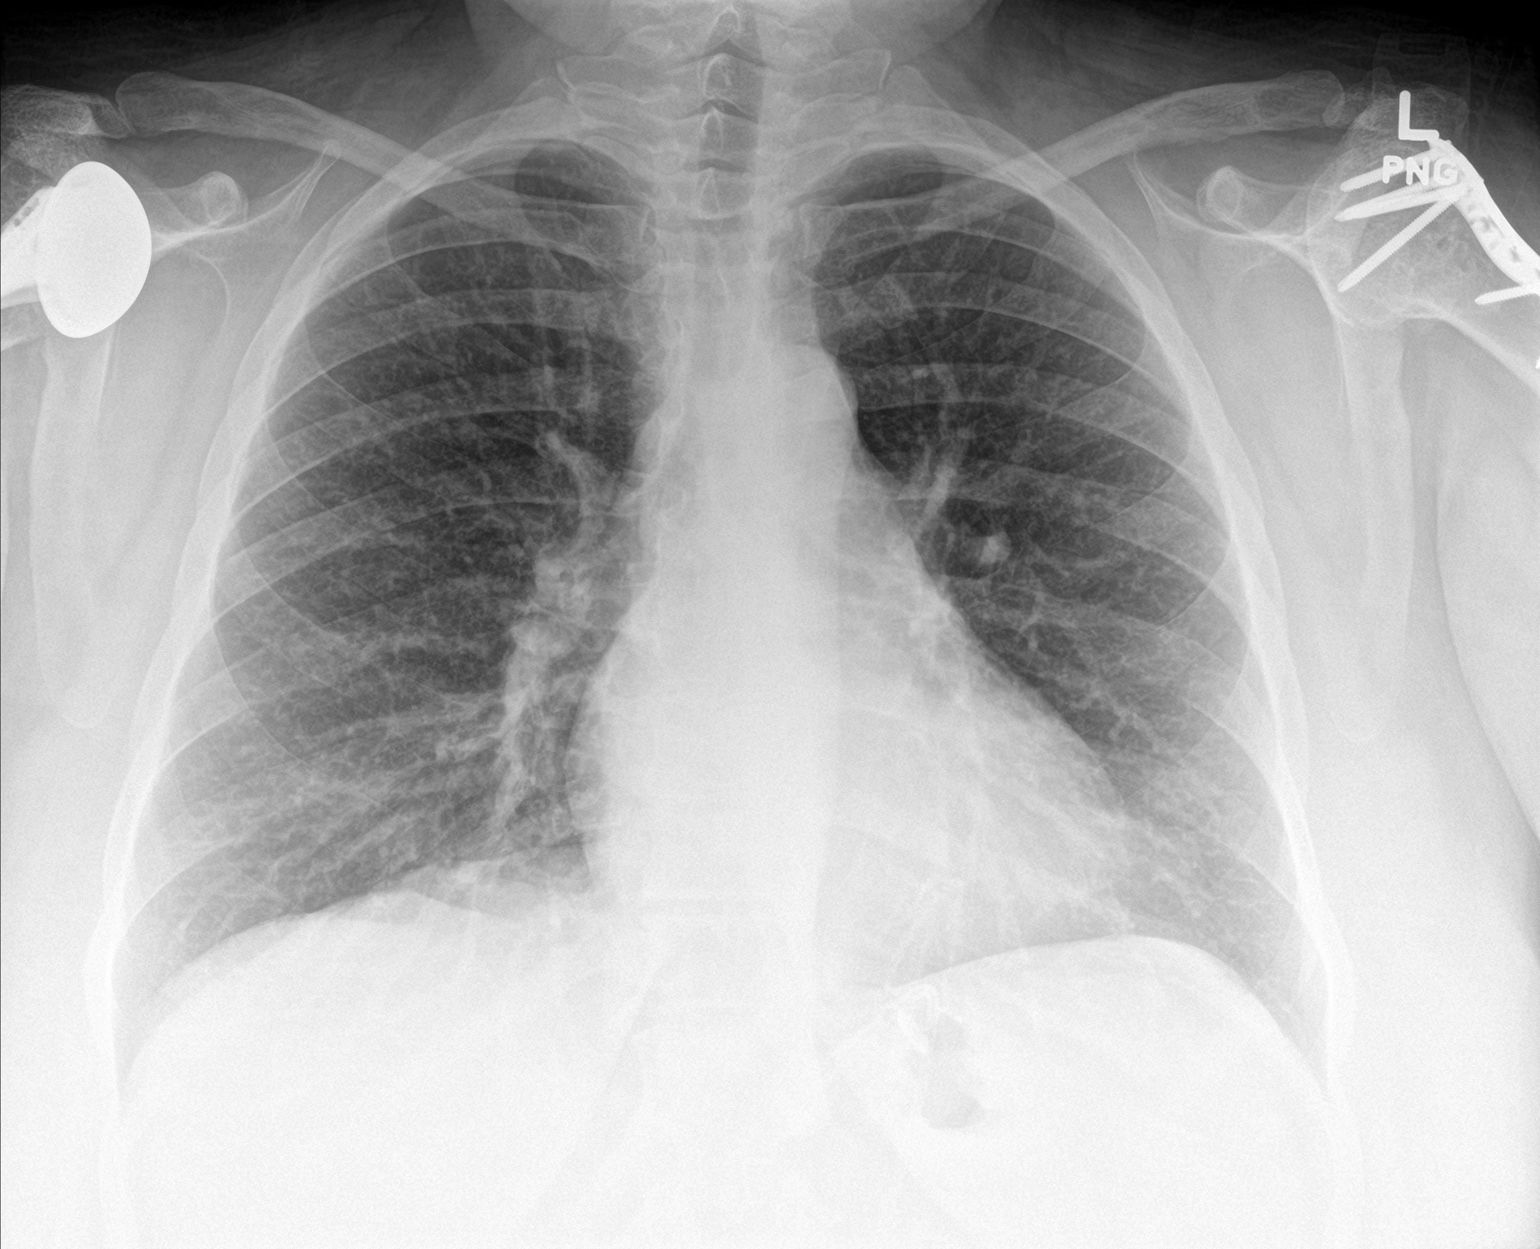

[chest lat]
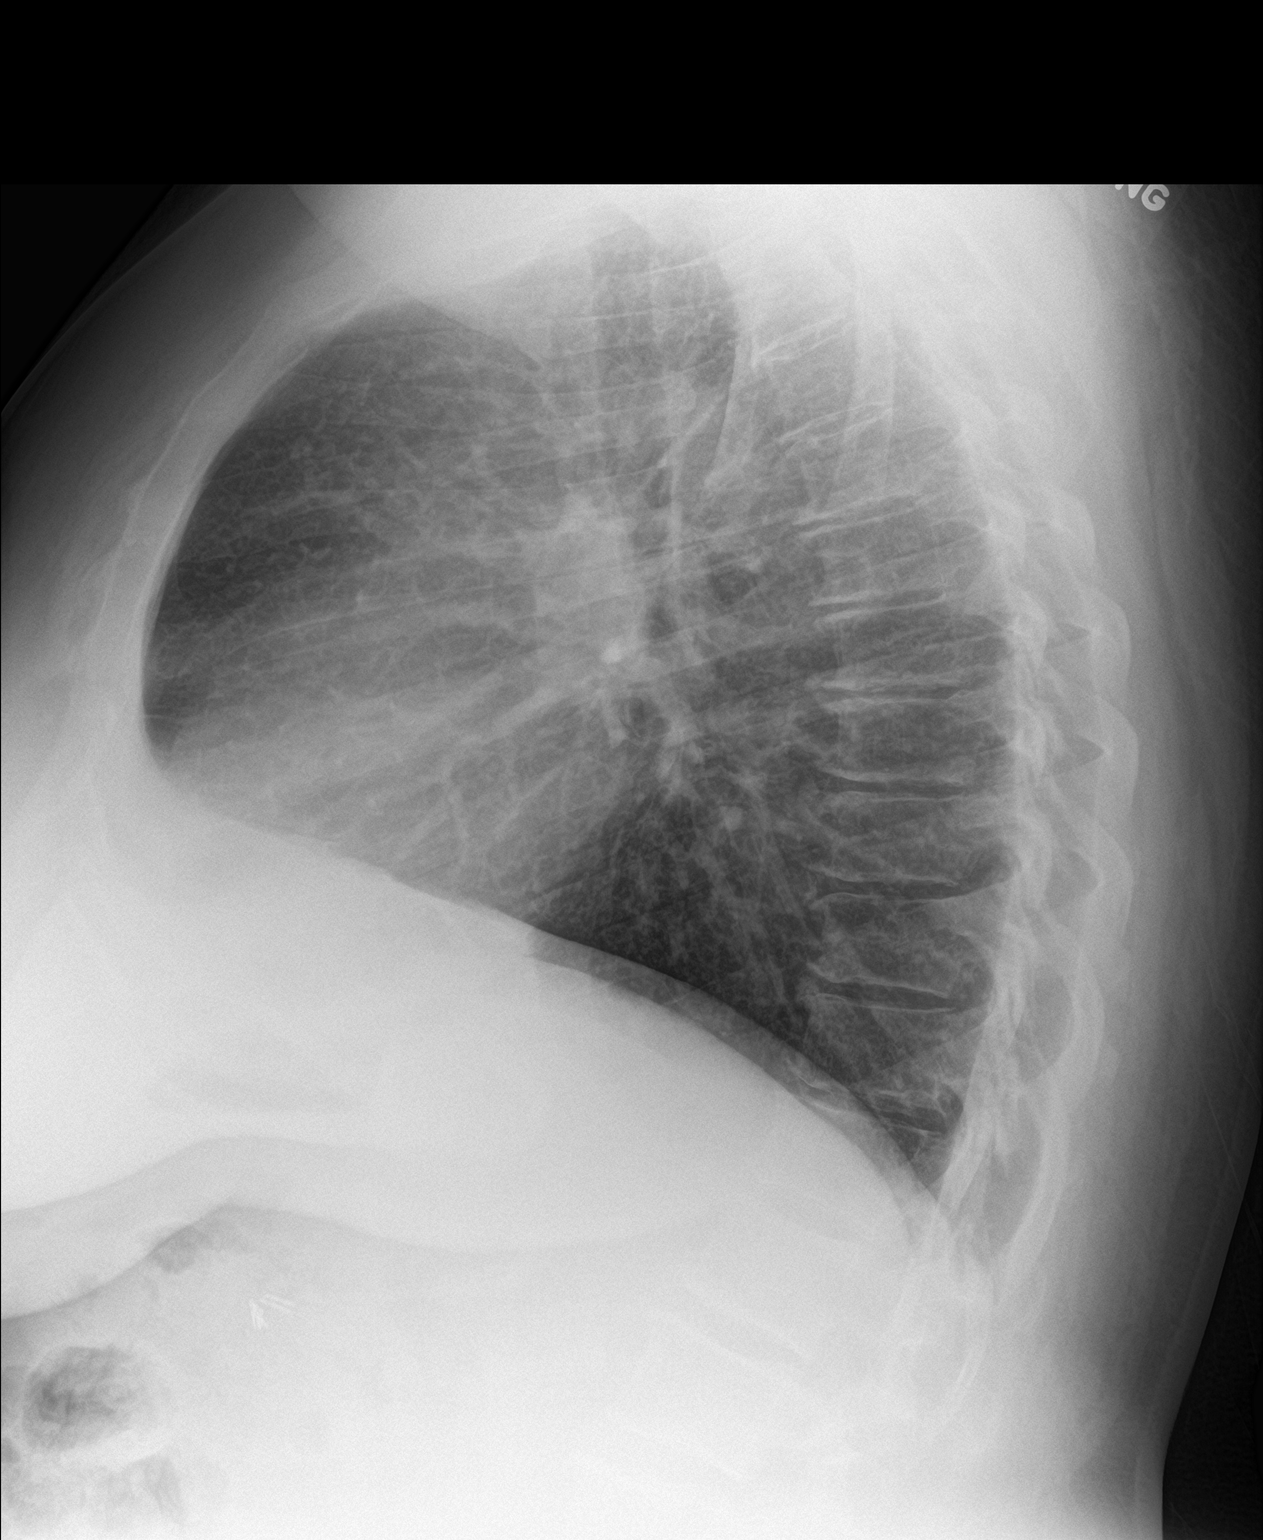

[2 of 2 positions shown; findings below may reference images not displayed]

FINDINGS: Mediastinum and hilar structures normal. Cardiomegaly with normal
pulmonary vascularity. No pleural effusion or pneumothorax.
Postsurgical changes both shoulders.
IMPRESSION: No acute cardiopulmonary disease.

## 2018-01-19 ENCOUNTER — Ambulatory Visit (INDEPENDENT_AMBULATORY_CARE_PROVIDER_SITE_OTHER): Payer: BLUE CROSS/BLUE SHIELD | Admitting: Internal Medicine

## 2018-01-19 ENCOUNTER — Encounter: Payer: Self-pay | Admitting: Internal Medicine

## 2018-01-19 VITALS — BP 130/82 | HR 83 | Temp 99.1°F | Ht 67.0 in | Wt 376.0 lb

## 2018-01-19 DIAGNOSIS — R002 Palpitations: Secondary | ICD-10-CM | POA: Diagnosis not present

## 2018-01-19 DIAGNOSIS — I10 Essential (primary) hypertension: Secondary | ICD-10-CM | POA: Diagnosis not present

## 2018-01-19 DIAGNOSIS — E559 Vitamin D deficiency, unspecified: Secondary | ICD-10-CM

## 2018-01-19 DIAGNOSIS — G8929 Other chronic pain: Secondary | ICD-10-CM | POA: Diagnosis not present

## 2018-01-19 DIAGNOSIS — F329 Major depressive disorder, single episode, unspecified: Secondary | ICD-10-CM

## 2018-01-19 DIAGNOSIS — F410 Panic disorder [episodic paroxysmal anxiety] without agoraphobia: Secondary | ICD-10-CM | POA: Diagnosis not present

## 2018-01-19 DIAGNOSIS — M5440 Lumbago with sciatica, unspecified side: Secondary | ICD-10-CM

## 2018-01-19 MED ORDER — HYDROCODONE-ACETAMINOPHEN 5-325 MG PO TABS: 1.0000 | ORAL_TABLET | Freq: Four times a day (QID) | ORAL | 0 refills | Status: DC | PRN

## 2018-01-19 MED ORDER — ALPRAZOLAM 1 MG PO TABS: ORAL_TABLET | ORAL | 2 refills | Status: DC

## 2018-01-19 MED ORDER — VITAMIN D (ERGOCALCIFEROL) 1.25 MG (50000 UNIT) PO CAPS: ORAL_CAPSULE | ORAL | 0 refills | Status: DC

## 2018-01-19 NOTE — Assessment & Plan Note (Signed)
BP Readings from Last 3 Encounters:  01/19/18 130/82  10/13/17 128/72  08/12/17 138/82

## 2018-01-19 NOTE — Patient Instructions (Signed)
Cardiac CT calcium scoring test $150   Computed tomography, more commonly known as a CT or CAT scan, is a diagnostic medical imaging test. Like traditional x-rays, it produces multiple images or pictures of the inside of the body. The cross-sectional images generated during a CT scan can be reformatted in multiple planes. They can even generate three-dimensional images. These images can be viewed on a computer monitor, printed on film or by a 3D printer, or transferred to a CD or DVD. CT images of internal organs, bones, soft tissue and blood vessels provide greater detail than traditional x-rays, particularly of soft tissues and blood vessels. A cardiac CT scan for coronary calcium is a non-invasive way of obtaining information about the presence, location and extent of calcified plaque in the coronary arteries-the vessels that supply oxygen-containing blood to the heart muscle. Calcified plaque results when there is a build-up of fat and other substances under the inner layer of the artery. This material can calcify which signals the presence of atherosclerosis, a disease of the vessel wall, also called coronary artery disease (CAD). People with this disease have an increased risk for heart attacks. In addition, over time, progression of plaque build up (CAD) can narrow the arteries or even close off blood flow to the heart. The result may be chest pain, sometimes called "angina," or a heart attack. Because calcium is a marker of CAD, the amount of calcium detected on a cardiac CT scan is a helpful prognostic tool. The findings on cardiac CT are expressed as a calcium score. Another name for this test is coronary artery calcium scoring.  What are some common uses of the procedure? The goal of cardiac CT scan for calcium scoring is to determine if CAD is present and to what extent, even if there are no symptoms. It is a screening study that may be recommended by a physician for patients with risk factors  for CAD but no clinical symptoms. The major risk factors for CAD are: . high blood cholesterol levels  . family history of heart attacks  . diabetes  . high blood pressure  . cigarette smoking  . overweight or obese  . physical inactivity   A negative cardiac CT scan for calcium scoring shows no calcification within the coronary arteries. This suggests that CAD is absent or so minimal it cannot be seen by this technique. The chance of having a heart attack over the next two to five years is very low under these circumstances. A positive test means that CAD is present, regardless of whether or not the patient is experiencing any symptoms. The amount of calcification-expressed as the calcium score-may help to predict the likelihood of a myocardial infarction (heart attack) in the coming years and helps your medical doctor or cardiologist decide whether the patient may need to take preventive medicine or undertake other measures such as diet and exercise to lower the risk for heart attack. The extent of CAD is graded according to your calcium score:  Calcium Score  Presence of CAD  0 No evidence of CAD   1-10 Minimal evidence of CAD  11-100 Mild evidence of CAD  101-400 Moderate evidence of CAD  Over 400 Extensive evidence of CAD

## 2018-01-19 NOTE — Assessment & Plan Note (Signed)
Xanax prn  Potential benefits of a long term benzodiazepines  use as well as potential risks  and complications were explained to the patient and were aknowledged.

## 2018-01-19 NOTE — Assessment & Plan Note (Signed)
EKG Pt declined cardiology ref CT ca scoring info given

## 2018-01-19 NOTE — Assessment & Plan Note (Signed)
Breo

## 2018-01-19 NOTE — Assessment & Plan Note (Signed)
Norco prn  Potential benefits of a long term opioids use as well as potential risks (i.e. addiction risk, apnea etc) and complications (i.e. Somnolence, constipation and others) were explained to the patient and were aknowledged.

## 2018-01-19 NOTE — Assessment & Plan Note (Signed)
Wellbutrin, Paxil

## 2018-01-19 NOTE — Assessment & Plan Note (Signed)
Vit D 

## 2018-01-19 NOTE — Progress Notes (Signed)
Subjective:  Patient ID: Stephanie Harper, female    DOB: 1970/09/05  Age: 48 y.o. MRN: [id number]  CC: No chief complaint on file.   HPI Stephanie Harper presents for LBP, anxiety, depression f/u  C/o palpitations at times  Outpatient Medications Prior to Visit  Medication Sig Dispense Refill  . albuterol (VENTOLIN HFA) 108 (90 Base) MCG/ACT inhaler Inhale 2 puffs into the lungs every 6 (six) hours as needed. 1 Inhaler 5  . ALPRAZolam (XANAX) 1 MG tablet TAKE 1 TABLET 3 TIMES A DAY AS NEEDED FOR ANXIETY OR SLEEP 90 tablet 2  . buPROPion (WELLBUTRIN SR) 100 MG 12 hr tablet TAKE 1 TABLET BY MOUTH TWICE A DAY 180 tablet 1  . Cholecalciferol 1000 UNITS tablet Take 2 tablets (2,000 Units total) by mouth daily. 100 tablet 3  . fluticasone furoate-vilanterol (BREO ELLIPTA) 100-25 MCG/INH AEPB Inhale 1 puff into the lungs daily. 180 each 2  . furosemide (LASIX) 40 MG tablet TAKE 1 TABLET (40 MG TOTAL) BY MOUTH DAILY AS NEEDED. 90 tablet 3  . HYDROcodone-acetaminophen (NORCO/VICODIN) 5-325 MG tablet Take 1 tablet by mouth every 6 (six) hours as needed for moderate pain or severe pain. 120 tablet 0  . ibuprofen (ADVIL,MOTRIN) 600 MG tablet Take 1 tablet (600 mg total) by mouth 2 (two) times daily as needed. 180 tablet 3  . Multiple Vitamin (MULTIVITAMIN) tablet Take 1 tablet by mouth 2 (two) times daily.     . niacinamide 500 MG tablet     . PARoxetine (PAXIL) 10 MG tablet TAKE 1 TABLET EVERY DAY 90 tablet 3  . pentoxifylline (TRENTAL) 400 MG CR tablet Take by mouth.    . vitamin B-12 (CYANOCOBALAMIN) 1000 MCG tablet Take 1,000 mcg by mouth daily.     . Vitamin D, Ergocalciferol, (DRISDOL) 50000 units CAPS capsule TAKE ONE CAPSULE ONCE A WEEK 12 capsule 1   No facility-administered medications prior to visit.     ROS: Review of Systems  Constitutional: Negative.  Negative for activity change, appetite change, chills, diaphoresis, fatigue, fever and unexpected weight change.  HENT:  Negative for congestion, ear pain, facial swelling, hearing loss, mouth sores, nosebleeds, postnasal drip, rhinorrhea, sinus pressure, sneezing, sore throat, tinnitus and trouble swallowing.   Eyes: Negative for pain, discharge, redness, itching and visual disturbance.  Respiratory: Negative for cough, chest tightness, shortness of breath, wheezing and stridor.   Cardiovascular: Positive for palpitations. Negative for chest pain and leg swelling.  Gastrointestinal: Negative for abdominal distention, anal bleeding, blood in stool, constipation, diarrhea, nausea and rectal pain.  Genitourinary: Negative for difficulty urinating, dysuria, flank pain, frequency, genital sores, hematuria, pelvic pain, urgency, vaginal bleeding and vaginal discharge.  Musculoskeletal: Negative for arthralgias, back pain, gait problem, joint swelling, neck pain and neck stiffness.  Skin: Negative.  Negative for rash.  Neurological: Negative for dizziness, tremors, seizures, syncope, speech difficulty, weakness, numbness and headaches.  Hematological: Negative for adenopathy. Does not bruise/bleed easily.  Psychiatric/Behavioral: Negative for behavioral problems, decreased concentration, dysphoric mood, sleep disturbance and suicidal ideas. The patient is not nervous/anxious.     Objective:  BP 130/82 (BP Location: Left Arm, Patient Position: Sitting, Cuff Size: Large)   Pulse 83   Temp 99.1 F (37.3 C) (Oral)   Ht 5' 7"  (1.702 m)   Wt (!) 376 lb (170.6 kg)   SpO2 99%   BMI 58.89 kg/m   BP Readings from Last 3 Encounters:  01/19/18 130/82  10/13/17 128/72  08/12/17 138/82  Wt Readings from Last 3 Encounters:  01/19/18 (!) 376 lb (170.6 kg)  10/13/17 (!) 367 lb (166.5 kg)  08/12/17 (!) 358 lb (162.4 kg)    Physical Exam Constitutional:      General: She is not in acute distress.    Appearance: She is well-developed.  HENT:     Head: Normocephalic.     Right Ear: External ear normal.     Left Ear:  External ear normal.     Nose: Nose normal.  Eyes:     General:        Right eye: No discharge.        Left eye: No discharge.     Conjunctiva/sclera: Conjunctivae normal.     Pupils: Pupils are equal, round, and reactive to light.  Neck:     Musculoskeletal: Normal range of motion and neck supple.     Thyroid: No thyromegaly.     Vascular: No JVD.     Trachea: No tracheal deviation.  Cardiovascular:     Rate and Rhythm: Normal rate and regular rhythm.     Heart sounds: Normal heart sounds.  Pulmonary:     Effort: No respiratory distress.     Breath sounds: No stridor. No wheezing.  Abdominal:     General: Bowel sounds are normal. There is no distension.     Palpations: Abdomen is soft. There is no mass.     Tenderness: There is no abdominal tenderness. There is no guarding or rebound.  Musculoskeletal:        General: No tenderness.  Lymphadenopathy:     Cervical: No cervical adenopathy.  Skin:    Findings: No erythema or rash.  Neurological:     Cranial Nerves: No cranial nerve deficit.     Motor: No abnormal muscle tone.     Coordination: Coordination normal.     Deep Tendon Reflexes: Reflexes normal.  Psychiatric:        Behavior: Behavior normal.        Thought Content: Thought content normal.        Judgment: Judgment normal.    Procedure: EKG Indication: palpitations Impression: NSR. No acute changes.  Lab Results  Component Value Date   WBC 9.6 10/06/2017   HGB 13.7 10/06/2017   HCT 40.1 10/06/2017   PLT 298 10/06/2017   GLUCOSE 102 (H) 10/06/2017   CHOL 172 10/06/2017   TRIG 103 10/06/2017   HDL 42 (L) 10/06/2017   LDLCALC 109 (H) 10/06/2017   ALT 14 10/06/2017   AST 13 10/06/2017   NA 140 10/06/2017   K 4.4 10/06/2017   CL 104 10/06/2017   CREATININE 0.57 10/06/2017   BUN 9 10/06/2017   CO2 28 10/06/2017   TSH 2.10 10/06/2017   INR 1.0 08/09/2007   HGBA1C 5.7 01/19/2016    Dg Chest 2 View  Result Date: 01/19/2016 CLINICAL DATA:   Productive cough . EXAM: CHEST  2 VIEW COMPARISON:  06/26/2011 . FINDINGS: Mediastinum and hilar structures normal. Cardiomegaly with normal pulmonary vascularity. No pleural effusion or pneumothorax. Postsurgical changes both shoulders. IMPRESSION: No acute cardiopulmonary disease. Electronically Signed   By: Marcello Moores  Register   On: 01/19/2016 14:42    Assessment & Plan:   There are no diagnoses linked to this encounter.   No orders of the defined types were placed in this encounter.    Follow-up: No follow-ups on file.  Walker Kehr, MD

## 2018-01-30 DIAGNOSIS — L921 Necrobiosis lipoidica, not elsewhere classified: Secondary | ICD-10-CM | POA: Diagnosis not present

## 2018-02-06 ENCOUNTER — Other Ambulatory Visit: Payer: Self-pay | Admitting: Internal Medicine

## 2018-02-13 ENCOUNTER — Other Ambulatory Visit: Payer: Self-pay | Admitting: Internal Medicine

## 2018-02-16 ENCOUNTER — Other Ambulatory Visit: Payer: Self-pay | Admitting: Internal Medicine

## 2018-03-19 DIAGNOSIS — L921 Necrobiosis lipoidica, not elsewhere classified: Secondary | ICD-10-CM | POA: Diagnosis not present

## 2018-04-09 MED ORDER — ALPRAZOLAM 1 MG PO TABS: ORAL_TABLET | ORAL | 2 refills | Status: DC

## 2018-04-09 MED ORDER — HYDROCODONE-ACETAMINOPHEN 5-325 MG PO TABS: 1.0000 | ORAL_TABLET | Freq: Four times a day (QID) | ORAL | 0 refills | Status: DC | PRN

## 2018-04-20 ENCOUNTER — Encounter: Payer: Self-pay | Admitting: Internal Medicine

## 2018-04-20 ENCOUNTER — Ambulatory Visit (INDEPENDENT_AMBULATORY_CARE_PROVIDER_SITE_OTHER): Payer: BLUE CROSS/BLUE SHIELD | Admitting: Internal Medicine

## 2018-04-20 DIAGNOSIS — M5440 Lumbago with sciatica, unspecified side: Secondary | ICD-10-CM

## 2018-04-20 DIAGNOSIS — F329 Major depressive disorder, single episode, unspecified: Secondary | ICD-10-CM | POA: Diagnosis not present

## 2018-04-20 DIAGNOSIS — I10 Essential (primary) hypertension: Secondary | ICD-10-CM

## 2018-04-20 DIAGNOSIS — G8929 Other chronic pain: Secondary | ICD-10-CM

## 2018-04-20 DIAGNOSIS — F419 Anxiety disorder, unspecified: Secondary | ICD-10-CM

## 2018-04-20 MED ORDER — HYDROCODONE-ACETAMINOPHEN 5-325 MG PO TABS: 1.0000 | ORAL_TABLET | Freq: Four times a day (QID) | ORAL | 0 refills | Status: DC | PRN

## 2018-04-20 NOTE — Progress Notes (Signed)
Virtual Visit via Telephone Note  I connected with Stephanie Harper on 04/20/18 at  1:40 PM EDT by telephone and verified that I am speaking with the correct person using two identifiers.   I discussed the limitations, risks, security and privacy concerns of performing an evaluation and management service by telephone and the availability of in person appointments. I also discussed with the patient that there may be a patient responsible charge related to this service. The patient expressed understanding and agreed to proceed.   History of Present Illness:   Follow-up: Chronic low back pain, anxiety, hypertension Observations/Objective:  Stephanie Harper is in no acute distress, doing well Assessment and Plan:  See plan Follow Up Instructions:    I discussed the assessment and treatment plan with the patient. The patient was provided an opportunity to ask questions and all were answered. The patient agreed with the plan and demonstrated an understanding of the instructions.   The patient was advised to call back or seek an in-person evaluation if the symptoms worsen or if the condition fails to improve as anticipated.  I provided 20 minutes of non-face-to-face time during this encounter.   Walker Kehr, MD

## 2018-04-20 NOTE — Assessment & Plan Note (Signed)
Xanax prn  Potential benefits of a long term benzodiazepines  use as well as potential risks  and complications were explained to the patient and were aknowledged.

## 2018-04-20 NOTE — Assessment & Plan Note (Signed)
Not on meds No added salt diet

## 2018-04-20 NOTE — Assessment & Plan Note (Signed)
Norco prn  Potential benefits of a long term opioids use as well as potential risks (i.e. addiction risk, apnea etc) and complications (i.e. Somnolence, constipation and others) were explained to the patient and were aknowledged.

## 2018-04-20 NOTE — Assessment & Plan Note (Signed)
Wellbutrin, Paxil

## 2018-04-30 DIAGNOSIS — L921 Necrobiosis lipoidica, not elsewhere classified: Secondary | ICD-10-CM | POA: Diagnosis not present

## 2018-05-10 ENCOUNTER — Other Ambulatory Visit: Payer: Self-pay | Admitting: Internal Medicine

## 2018-05-12 ENCOUNTER — Other Ambulatory Visit: Payer: Self-pay | Admitting: Internal Medicine

## 2018-06-12 DIAGNOSIS — S81822A Laceration with foreign body, left lower leg, initial encounter: Secondary | ICD-10-CM | POA: Diagnosis not present

## 2018-06-12 DIAGNOSIS — L921 Necrobiosis lipoidica, not elsewhere classified: Secondary | ICD-10-CM | POA: Diagnosis not present

## 2018-06-22 ENCOUNTER — Other Ambulatory Visit: Payer: Self-pay | Admitting: Internal Medicine

## 2018-07-02 DIAGNOSIS — L921 Necrobiosis lipoidica, not elsewhere classified: Secondary | ICD-10-CM | POA: Diagnosis not present

## 2018-07-21 ENCOUNTER — Encounter: Payer: Self-pay | Admitting: Internal Medicine

## 2018-07-21 ENCOUNTER — Ambulatory Visit (INDEPENDENT_AMBULATORY_CARE_PROVIDER_SITE_OTHER): Payer: BC Managed Care – PPO | Admitting: Internal Medicine

## 2018-07-21 DIAGNOSIS — F419 Anxiety disorder, unspecified: Secondary | ICD-10-CM | POA: Diagnosis not present

## 2018-07-21 DIAGNOSIS — R61 Generalized hyperhidrosis: Secondary | ICD-10-CM | POA: Diagnosis not present

## 2018-07-21 DIAGNOSIS — M5440 Lumbago with sciatica, unspecified side: Secondary | ICD-10-CM | POA: Diagnosis not present

## 2018-07-21 DIAGNOSIS — R002 Palpitations: Secondary | ICD-10-CM | POA: Diagnosis not present

## 2018-07-21 DIAGNOSIS — G8929 Other chronic pain: Secondary | ICD-10-CM | POA: Diagnosis not present

## 2018-07-21 MED ORDER — HYDROCODONE-ACETAMINOPHEN 5-325 MG PO TABS: 1.0000 | ORAL_TABLET | Freq: Four times a day (QID) | ORAL | 0 refills | Status: DC | PRN

## 2018-07-21 MED ORDER — ALPRAZOLAM 1 MG PO TABS: ORAL_TABLET | ORAL | 2 refills | Status: DC

## 2018-07-21 MED ORDER — PAROXETINE HCL 10 MG PO TABS: 20.0000 mg | ORAL_TABLET | Freq: Every day | ORAL | 3 refills | Status: DC

## 2018-07-21 NOTE — Assessment & Plan Note (Signed)
Severe FSH, TSH Soy milk Increase Paroxetine HRT discussed - high risk in a smoker (3 cig/d)

## 2018-07-21 NOTE — Assessment & Plan Note (Signed)
Norco prn  Potential benefits of a long term opioids use as well as potential risks (i.e. addiction risk, apnea etc) and complications (i.e. Somnolence, constipation and others) were explained to the patient and were aknowledged.

## 2018-07-21 NOTE — Assessment & Plan Note (Signed)
Chronic  Xanax prn  Potential benefits of a long term benzodiazepines  use as well as potential risks  and complications were explained to the patient and were aknowledged.

## 2018-07-21 NOTE — Assessment & Plan Note (Signed)
Doing well 

## 2018-07-21 NOTE — Progress Notes (Signed)
Virtual Visit via Video Note  I connected with [name] on 07/21/18 at 10:00 AM EDT by a video enabled telemedicine application and verified that I am speaking with the correct person using two identifiers.   I discussed the limitations of evaluation and management by telemedicine and the availability of in person appointments. The patient expressed understanding and agreed to proceed.  History of Present Illness: We need to follow-up on chronic pain, anxiety C/o severe night sweats x 2 months On Doxy now for leg wound (Dr Ubaldo Glassing) Smoking 3 cigs/d  There has been no runny nose, cough, chest pain, shortness of breath, abdominal pain, diarrhea, constipation   Observations/Objective: The patient appears to be in no acute distress, looks well.  Assessment and Plan:  See my Assessment and Plan. Follow Up Instructions:    I discussed the assessment and treatment plan with the patient. The patient was provided an opportunity to ask questions and all were answered. The patient agreed with the plan and demonstrated an understanding of the instructions.   The patient was advised to call back or seek an in-person evaluation if the symptoms worsen or if the condition fails to improve as anticipated.  I provided face-to-face time during this encounter. We were at different locations.   Walker Kehr, MD

## 2018-07-30 DIAGNOSIS — L921 Necrobiosis lipoidica, not elsewhere classified: Secondary | ICD-10-CM | POA: Diagnosis not present

## 2018-08-03 ENCOUNTER — Other Ambulatory Visit (INDEPENDENT_AMBULATORY_CARE_PROVIDER_SITE_OTHER): Payer: BC Managed Care – PPO

## 2018-08-03 DIAGNOSIS — R61 Generalized hyperhidrosis: Secondary | ICD-10-CM | POA: Diagnosis not present

## 2018-08-03 DIAGNOSIS — R002 Palpitations: Secondary | ICD-10-CM | POA: Diagnosis not present

## 2018-08-03 LAB — URINALYSIS
Bilirubin Urine: NEGATIVE
Hgb urine dipstick: NEGATIVE
Ketones, ur: NEGATIVE
Leukocytes,Ua: NEGATIVE
Nitrite: NEGATIVE
Specific Gravity, Urine: 1.02 (ref 1.000–1.030)
Total Protein, Urine: NEGATIVE
Urine Glucose: NEGATIVE
Urobilinogen, UA: 0.2 (ref 0.0–1.0)
pH: 7.5 (ref 5.0–8.0)

## 2018-08-03 LAB — CBC WITH DIFFERENTIAL/PLATELET
Basophils Absolute: 0.1 10*3/uL (ref 0.0–0.1)
Basophils Relative: 0.9 % (ref 0.0–3.0)
Eosinophils Absolute: 0.1 10*3/uL (ref 0.0–0.7)
Eosinophils Relative: 0.8 % (ref 0.0–5.0)
HCT: 43.6 % (ref 36.0–46.0)
Hemoglobin: 14.5 g/dL (ref 12.0–15.0)
Lymphocytes Relative: 21.4 % (ref 12.0–46.0)
Lymphs Abs: 2.6 10*3/uL (ref 0.7–4.0)
MCHC: 33.3 g/dL (ref 30.0–36.0)
MCV: 90 fl (ref 78.0–100.0)
Monocytes Absolute: 0.9 10*3/uL (ref 0.1–1.0)
Monocytes Relative: 7.1 % (ref 3.0–12.0)
Neutro Abs: 8.4 10*3/uL — ABNORMAL HIGH (ref 1.4–7.7)
Neutrophils Relative %: 69.8 % (ref 43.0–77.0)
Platelets: 322 10*3/uL (ref 150.0–400.0)
RBC: 4.84 Mil/uL (ref 3.87–5.11)
RDW: 14.1 % (ref 11.5–15.5)
WBC: 12.1 10*3/uL — ABNORMAL HIGH (ref 4.0–10.5)

## 2018-08-03 LAB — TSH: TSH: 2.68 u[IU]/mL (ref 0.35–4.50)

## 2018-08-03 LAB — LIPID PANEL
Cholesterol: 168 mg/dL (ref 0–200)
HDL: 45 mg/dL (ref >39.00)
LDL Cholesterol: 96 mg/dL (ref 0–99)
NonHDL: 122.68
Total CHOL/HDL Ratio: 4
Triglycerides: 134 mg/dL (ref 0.0–149.0)
VLDL: 26.8 mg/dL (ref 0.0–40.0)

## 2018-08-03 LAB — HEPATIC FUNCTION PANEL
ALT: 13 U/L (ref 0–35)
AST: 11 U/L (ref 0–37)
Albumin: 4 g/dL (ref 3.5–5.2)
Alkaline Phosphatase: 84 U/L (ref 39–117)
Bilirubin, Direct: 0.1 mg/dL (ref 0.0–0.3)
Total Bilirubin: 0.4 mg/dL (ref 0.2–1.2)
Total Protein: 7.2 g/dL (ref 6.0–8.3)

## 2018-08-03 LAB — BASIC METABOLIC PANEL
BUN: 12 mg/dL (ref 6–23)
CO2: 30 mEq/L (ref 19–32)
Calcium: 9.4 mg/dL (ref 8.4–10.5)
Chloride: 102 mEq/L (ref 96–112)
Creatinine, Ser: 0.66 mg/dL (ref 0.40–1.20)
GFR: 95.84 mL/min (ref >60.00)
Glucose, Bld: 107 mg/dL — ABNORMAL HIGH (ref 70–99)
Potassium: 3.8 mEq/L (ref 3.5–5.1)
Sodium: 139 mEq/L (ref 135–145)

## 2018-08-03 LAB — FOLLICLE STIMULATING HORMONE: FSH: 9.2 m[IU]/mL

## 2018-09-02 ENCOUNTER — Other Ambulatory Visit: Payer: Self-pay | Admitting: Internal Medicine

## 2018-09-02 DIAGNOSIS — D72829 Elevated white blood cell count, unspecified: Secondary | ICD-10-CM

## 2018-09-04 ENCOUNTER — Other Ambulatory Visit (INDEPENDENT_AMBULATORY_CARE_PROVIDER_SITE_OTHER): Payer: BC Managed Care – PPO

## 2018-09-04 DIAGNOSIS — D72829 Elevated white blood cell count, unspecified: Secondary | ICD-10-CM | POA: Diagnosis not present

## 2018-09-04 LAB — CBC WITH DIFFERENTIAL/PLATELET
Basophils Absolute: 0.1 10*3/uL (ref 0.0–0.1)
Basophils Relative: 0.6 % (ref 0.0–3.0)
Eosinophils Absolute: 0.1 10*3/uL (ref 0.0–0.7)
Eosinophils Relative: 1.3 % (ref 0.0–5.0)
HCT: 42.7 % (ref 36.0–46.0)
Hemoglobin: 14.2 g/dL (ref 12.0–15.0)
Lymphocytes Relative: 18.7 % (ref 12.0–46.0)
Lymphs Abs: 1.9 10*3/uL (ref 0.7–4.0)
MCHC: 33.2 g/dL (ref 30.0–36.0)
MCV: 89.9 fl (ref 78.0–100.0)
Monocytes Absolute: 0.8 10*3/uL (ref 0.1–1.0)
Monocytes Relative: 7.8 % (ref 3.0–12.0)
Neutro Abs: 7.3 10*3/uL (ref 1.4–7.7)
Neutrophils Relative %: 71.6 % (ref 43.0–77.0)
Platelets: 302 10*3/uL (ref 150.0–400.0)
RBC: 4.75 Mil/uL (ref 3.87–5.11)
RDW: 14 % (ref 11.5–15.5)
WBC: 10.2 10*3/uL (ref 4.0–10.5)

## 2018-09-06 ENCOUNTER — Other Ambulatory Visit: Payer: Self-pay | Admitting: Internal Medicine

## 2018-09-07 ENCOUNTER — Encounter: Payer: Self-pay | Admitting: Internal Medicine

## 2018-09-07 ENCOUNTER — Other Ambulatory Visit: Payer: Self-pay

## 2018-09-07 ENCOUNTER — Ambulatory Visit (INDEPENDENT_AMBULATORY_CARE_PROVIDER_SITE_OTHER): Payer: BC Managed Care – PPO | Admitting: Internal Medicine

## 2018-09-07 VITALS — BP 130/82 | HR 94 | Temp 98.1°F | Ht 67.0 in | Wt 382.0 lb

## 2018-09-07 DIAGNOSIS — Z23 Encounter for immunization: Secondary | ICD-10-CM | POA: Diagnosis not present

## 2018-09-07 DIAGNOSIS — R21 Rash and other nonspecific skin eruption: Secondary | ICD-10-CM

## 2018-09-07 DIAGNOSIS — D72828 Other elevated white blood cell count: Secondary | ICD-10-CM | POA: Diagnosis not present

## 2018-09-07 DIAGNOSIS — I10 Essential (primary) hypertension: Secondary | ICD-10-CM | POA: Diagnosis not present

## 2018-09-07 DIAGNOSIS — D72829 Elevated white blood cell count, unspecified: Secondary | ICD-10-CM | POA: Insufficient documentation

## 2018-09-07 NOTE — Assessment & Plan Note (Signed)
On Doxy x 3 mo per Dr Ubaldo Glassing

## 2018-09-07 NOTE — Assessment & Plan Note (Signed)
Periodic  Intra skin steroids by Dr Ubaldo Glassing could play a role

## 2018-09-07 NOTE — Progress Notes (Signed)
Subjective:  Patient ID: Stephanie Harper, female    DOB: 11-24-1970  Age: 48 y.o. MRN: [id number]  CC: No chief complaint on file.   HPI Stephanie Harper presents for elev WBC On Doxy x 3 mo per Dr Ubaldo Glassing - skin inflammation  C/o heel spurs  Outpatient Medications Prior to Visit  Medication Sig Dispense Refill  . albuterol (VENTOLIN HFA) 108 (90 Base) MCG/ACT inhaler Inhale 2 puffs into the lungs every 6 (six) hours as needed. 1 Inhaler 5  . ALPRAZolam (XANAX) 1 MG tablet TAKE 1 TABLET 3 TIMES A DAY AS NEEDED FOR ANXIETY OR SLEEP 90 tablet 2  . BREO ELLIPTA 100-25 MCG/INH AEPB TAKE 1 PUFF BY MOUTH EVERY DAY 180 each 3  . buPROPion (WELLBUTRIN SR) 100 MG 12 hr tablet TAKE 1 TABLET BY MOUTH TWICE A DAY 180 tablet 1  . Cholecalciferol 1000 UNITS tablet Take 2 tablets (2,000 Units total) by mouth daily. 100 tablet 3  . furosemide (LASIX) 40 MG tablet TAKE 1 TABLET (40 MG TOTAL) BY MOUTH DAILY AS NEEDED. 90 tablet 3  . HYDROcodone-acetaminophen (NORCO/VICODIN) 5-325 MG tablet Take 1 tablet by mouth every 6 (six) hours as needed for moderate pain or severe pain. 120 tablet 0  . HYDROcodone-acetaminophen (NORCO/VICODIN) 5-325 MG tablet Take 1 tablet by mouth every 6 (six) hours as needed for severe pain. 120 tablet 0  . HYDROcodone-acetaminophen (NORCO/VICODIN) 5-325 MG tablet Take 1 tablet by mouth every 6 (six) hours as needed for severe pain. 120 tablet 0  . ibuprofen (ADVIL,MOTRIN) 600 MG tablet TAKE 1 TABLET (600 MG TOTAL) BY MOUTH 2 (TWO) TIMES DAILY AS NEEDED. 180 tablet 3  . Multiple Vitamin (MULTIVITAMIN) tablet Take 1 tablet by mouth 2 (two) times daily.     . niacinamide 500 MG tablet     . PARoxetine (PAXIL) 10 MG tablet TAKE 1 TABLET EVERY DAY 90 tablet 3  . pentoxifylline (TRENTAL) 400 MG CR tablet Take by mouth.    . vitamin B-12 (CYANOCOBALAMIN) 1000 MCG tablet Take 1,000 mcg by mouth daily.     . Vitamin D, Ergocalciferol, (DRISDOL) 1.25 MG (50000 UT) CAPS capsule TAKE 1  CAPSULE BY MOUTH ONE TIME PER WEEK 12 capsule 1   No facility-administered medications prior to visit.     ROS: Review of Systems  Constitutional: Positive for unexpected weight change. Negative for activity change, appetite change, chills and fatigue.  HENT: Negative for congestion, mouth sores and sinus pressure.   Eyes: Negative for visual disturbance.  Respiratory: Negative for cough and chest tightness.   Cardiovascular: Positive for leg swelling.  Gastrointestinal: Negative for abdominal pain and nausea.  Genitourinary: Negative for difficulty urinating, frequency and vaginal pain.  Musculoskeletal: Positive for arthralgias. Negative for back pain and gait problem.  Skin: Positive for color change. Negative for pallor and rash.  Neurological: Negative for dizziness, tremors, weakness, numbness and headaches.  Psychiatric/Behavioral: Negative for confusion, sleep disturbance and suicidal ideas.    Objective:  BP 130/82 (BP Location: Left Arm, Patient Position: Sitting, Cuff Size: Large)   Pulse 94   Temp 98.1 F (36.7 C) (Oral)   Ht 5' 7"  (1.702 m)   Wt (!) 382 lb (173.3 kg)   SpO2 97%   BMI 59.83 kg/m   BP Readings from Last 3 Encounters:  09/07/18 130/82  01/19/18 130/82  10/13/17 128/72    Wt Readings from Last 3 Encounters:  09/07/18 (!) 382 lb (173.3 kg)  01/19/18 (!) 376 lb (  170.6 kg)  10/13/17 (!) 367 lb (166.5 kg)    Physical Exam Constitutional:      General: She is not in acute distress.    Appearance: She is well-developed.  HENT:     Head: Normocephalic.     Right Ear: External ear normal.     Left Ear: External ear normal.     Nose: Nose normal.  Eyes:     General:        Right eye: No discharge.        Left eye: No discharge.     Conjunctiva/sclera: Conjunctivae normal.     Pupils: Pupils are equal, round, and reactive to light.  Neck:     Musculoskeletal: Normal range of motion and neck supple.     Thyroid: No thyromegaly.     Vascular:  No JVD.     Trachea: No tracheal deviation.  Cardiovascular:     Rate and Rhythm: Normal rate and regular rhythm.     Heart sounds: Normal heart sounds.  Pulmonary:     Effort: No respiratory distress.     Breath sounds: No stridor. No wheezing.  Abdominal:     General: Bowel sounds are normal. There is no distension.     Palpations: Abdomen is soft. There is no mass.     Tenderness: There is no abdominal tenderness. There is no guarding or rebound.  Musculoskeletal:        General: No tenderness.  Lymphadenopathy:     Cervical: No cervical adenopathy.  Skin:    Findings: No erythema or rash.  Neurological:     Cranial Nerves: No cranial nerve deficit.     Motor: No abnormal muscle tone.     Coordination: Coordination normal.     Deep Tendon Reflexes: Reflexes normal.  Psychiatric:        Behavior: Behavior normal.        Thought Content: Thought content normal.        Judgment: Judgment normal.     Lab Results  Component Value Date   WBC 10.2 09/04/2018   HGB 14.2 09/04/2018   HCT 42.7 09/04/2018   PLT 302.0 09/04/2018   GLUCOSE 107 (H) 08/03/2018   CHOL 168 08/03/2018   TRIG 134.0 08/03/2018   HDL 45.00 08/03/2018   LDLCALC 96 08/03/2018   ALT 13 08/03/2018   AST 11 08/03/2018   NA 139 08/03/2018   K 3.8 08/03/2018   CL 102 08/03/2018   CREATININE 0.66 08/03/2018   BUN 12 08/03/2018   CO2 30 08/03/2018   TSH 2.68 08/03/2018   INR 1.0 08/09/2007   HGBA1C 5.7 01/19/2016    Dg Chest 2 View  Result Date: 01/19/2016 CLINICAL DATA:  Productive cough . EXAM: CHEST  2 VIEW COMPARISON:  06/26/2011 . FINDINGS: Mediastinum and hilar structures normal. Cardiomegaly with normal pulmonary vascularity. No pleural effusion or pneumothorax. Postsurgical changes both shoulders. IMPRESSION: No acute cardiopulmonary disease. Electronically Signed   By: Marcello Moores  Register   On: 01/19/2016 14:42    Assessment & Plan:   Diagnoses and all orders for this visit:  Need for  influenza vaccination -     Flu Vaccine QUAD 36+ mos IM     No orders of the defined types were placed in this encounter.    Follow-up: No follow-ups on file.  Walker Kehr, MD

## 2018-09-07 NOTE — Assessment & Plan Note (Signed)
Diet diescussed

## 2018-09-07 NOTE — Assessment & Plan Note (Signed)
Low salt diet

## 2018-09-07 NOTE — Patient Instructions (Addendum)
These suggestions will probably help you to improve your metabolism if you are not overweight and to lose weight if you are overweight: 1.  Reduce your consumption of sugars and starches.  Eliminate high fructose corn syrup from your diet.  Reduce your consumption of processed foods.  For desserts try to have seasonal fruits, berries, nuts, cheeses or dark chocolate with more than 70% cacao. 2.  Do not snack 3.  You do not have to eat breakfast.  If you choose to have breakfast-eat plain greek yogurt, eggs, oatmeal (without sugar) 4.  Drink water, freshly brewed unsweetened tea (green, black or herbal) or coffee.  Do not drink sodas including diet sodas , juices, beverages sweetened with artificial sweeteners. 5.  Reduce your consumption of refined grains. 6.  Avoid protein drinks such as Optifast, Slim fast etc. Eat chicken, fish, meat, dairy and beans for your sources of protein 7.  Natural unprocessed fats like cold pressed virgin olive oil, butter, coconut oil are good for you.  Eat avocados 8.  Increase your consumption of fiber.  Fruits, berries, vegetables, whole grains, flaxseeds, Chia seeds, beans, popcorn, nuts, oatmeal are good sources of fiber 9.  Use vinegar in your diet, i.e. apple cider vinegar, red wine or balsamic vinegar 10.  You can try fasting.  For example you can skip breakfast and lunch every other day (24-hour fast) 11.  Stress reduction, good night sleep, relaxation, meditation, yoga and other physical activity is likely to help you to maintain low weight too. 12.  If you drink alcohol, limit your alcohol intake to no more than 2 drinks a day.   Mediterranean diet is good for you. ([employer] has a typical Mediterranean cuisine menu) The Mediterranean diet is a way of eating based on the traditional cuisine of countries bordering the [employer]. While there is no single definition of the Mediterranean diet, it is typically high in vegetables, fruits, whole grains,  beans, nut and seeds, and olive oil. The main components of Mediterranean diet include: [name] Daily consumption of vegetables, fruits, whole grains and healthy fats  . Weekly intake of fish, poultry, beans and eggs  . Moderate portions of dairy products  . Limited intake of red meat Other important elements of the Mediterranean diet are sharing meals with family and friends, enjoying a glass of red wine and being physically active. Health benefits of a Mediterranean diet: A traditional Mediterranean diet consisting of large quantities of fresh fruits and vegetables, nuts, fish and olive oil-coupled with physical activity-can reduce your risk of serious mental and physical health problems by: Preventing heart disease and strokes. Following a Mediterranean diet limits your intake of refined breads, processed foods, and red meat, and encourages drinking red wine instead of hard liquor-all factors that can help prevent heart disease and stroke. Keeping you agile. If you're an older adult, the nutrients gained with a Mediterranean diet may reduce your risk of developing muscle weakness and other signs of frailty by about 70 percent. Reducing the risk of Alzheimer's. Research suggests that the [place] diet may improve cholesterol, blood sugar levels, and overall blood vessel health, which in turn may reduce your risk of Alzheimer's disease or dementia. Halving the risk of Parkinson's disease. The high levels of antioxidants in the Mediterranean diet can prevent cells from undergoing a damaging process called oxidative stress, thereby cutting the risk of Parkinson's disease in half. Increasing longevity. By reducing your risk of developing heart disease or cancer with the Mediterranean diet,  you're reducing your risk of death at any age by 20%. Protecting against type 2 diabetes. A Mediterranean diet is rich in fiber which digests slowly, prevents huge swings in blood sugar, and can help you maintain a  healthy weight.    Cabbage soup recipe that will not make you gain weight: Take 1 small head of cabbage, 1 average pack of celery, 4 green peppers, 4 onions, 2 cans diced tomatoes (they are not available without salt), salt and spices to taste.  Chop cabbage, celery, peppers and onions.  And tomatoes and 2-2.5 liters (2.5 quarts) of water so that it would just cover the vegetables.  Bring to boil.  Add spices and salt.  Turn heat to low/medium and simmer for 20-25 minutes.  Naturally, you can make a smaller batch and change some of the ingredients.   Sign up for [employer] ( via [employer] on your phone or your ipad). If you don't have a [employer] card  - go to [employer] branch. They will set you up in 15 minutes. It is free. You can check out books to read and to listen, check out magazines and newspapers, movies etc.

## 2018-09-10 DIAGNOSIS — L921 Necrobiosis lipoidica, not elsewhere classified: Secondary | ICD-10-CM | POA: Diagnosis not present

## 2018-09-25 ENCOUNTER — Other Ambulatory Visit: Payer: Self-pay | Admitting: Internal Medicine

## 2018-10-22 ENCOUNTER — Ambulatory Visit (INDEPENDENT_AMBULATORY_CARE_PROVIDER_SITE_OTHER): Payer: BC Managed Care – PPO | Admitting: Internal Medicine

## 2018-10-22 DIAGNOSIS — F419 Anxiety disorder, unspecified: Secondary | ICD-10-CM | POA: Diagnosis not present

## 2018-10-22 DIAGNOSIS — M5440 Lumbago with sciatica, unspecified side: Secondary | ICD-10-CM | POA: Diagnosis not present

## 2018-10-22 DIAGNOSIS — G8929 Other chronic pain: Secondary | ICD-10-CM

## 2018-10-22 DIAGNOSIS — E559 Vitamin D deficiency, unspecified: Secondary | ICD-10-CM | POA: Diagnosis not present

## 2018-10-22 MED ORDER — ALBUTEROL SULFATE HFA 108 (90 BASE) MCG/ACT IN AERS: 2.0000 | INHALATION_SPRAY | Freq: Four times a day (QID) | RESPIRATORY_TRACT | 3 refills | Status: DC | PRN

## 2018-10-24 ENCOUNTER — Encounter: Payer: Self-pay | Admitting: Internal Medicine

## 2018-10-24 MED ORDER — HYDROCODONE-ACETAMINOPHEN 5-325 MG PO TABS: 1.0000 | ORAL_TABLET | Freq: Four times a day (QID) | ORAL | 0 refills | Status: DC | PRN

## 2018-10-24 MED ORDER — ALPRAZOLAM 1 MG PO TABS: ORAL_TABLET | ORAL | 2 refills | Status: DC

## 2018-10-24 NOTE — Assessment & Plan Note (Signed)
Norco  Potential benefits of a long term opioids use as well as potential risks (i.e. addiction risk, apnea etc) and complications (i.e. Somnolence, constipation and others) were explained to the patient and were aknowledged.

## 2018-10-24 NOTE — Assessment & Plan Note (Signed)
Xanax prn  Potential benefits of a long term benzodiazepines  use as well as potential risks  and complications were explained to the patient and were aknowledged.

## 2018-10-24 NOTE — Progress Notes (Signed)
Virtual Visit via Video Note  I connected with Stephanie Harper on 10/24/18 at  3:20 PM EDT by a video enabled telemedicine application and verified that I am speaking with the correct person using two identifiers.   I discussed the limitations of evaluation and management by telemedicine and the availability of in person appointments. The patient expressed understanding and agreed to proceed.  History of Present Illness: We need to follow-up on LBP, anxiety, Vit D def  There has been no runny nose, cough, chest pain, shortness of breath, abdominal pain, diarrhea, constipation, arthralgias, skin rashes.   Observations/Objective: The patient appears to be in no acute distress, looks well.  Assessment and Plan:  See my Assessment and Plan. Follow Up Instructions:    I discussed the assessment and treatment plan with the patient. The patient was provided an opportunity to ask questions and all were answered. The patient agreed with the plan and demonstrated an understanding of the instructions.   The patient was advised to call back or seek an in-person evaluation if the symptoms worsen or if the condition fails to improve as anticipated.  I provided face-to-face time during this encounter. We were at different locations.   Walker Kehr, MD

## 2018-10-24 NOTE — Assessment & Plan Note (Signed)
Vit d

## 2018-10-29 DIAGNOSIS — R030 Elevated blood-pressure reading, without diagnosis of hypertension: Secondary | ICD-10-CM | POA: Diagnosis not present

## 2018-10-29 DIAGNOSIS — N6320 Unspecified lump in the left breast, unspecified quadrant: Secondary | ICD-10-CM | POA: Diagnosis not present

## 2018-10-29 DIAGNOSIS — N6325 Unspecified lump in the left breast, overlapping quadrants: Secondary | ICD-10-CM | POA: Diagnosis not present

## 2018-11-03 ENCOUNTER — Other Ambulatory Visit (HOSPITAL_COMMUNITY): Payer: Self-pay | Admitting: Obstetrics & Gynecology

## 2018-11-03 DIAGNOSIS — N63 Unspecified lump in unspecified breast: Secondary | ICD-10-CM

## 2018-11-17 ENCOUNTER — Encounter (HOSPITAL_COMMUNITY): Payer: Medicare Other

## 2018-11-24 ENCOUNTER — Ambulatory Visit (HOSPITAL_COMMUNITY)
Admission: RE | Admit: 2018-11-24 | Discharge: 2018-11-24 | Disposition: A | Discharge status: Home / Self Care | Payer: BC Managed Care – PPO | Source: Ambulatory Visit | Attending: Obstetrics & Gynecology | Admitting: Obstetrics & Gynecology

## 2018-11-24 ENCOUNTER — Encounter (HOSPITAL_COMMUNITY): Payer: Self-pay

## 2018-11-24 ENCOUNTER — Other Ambulatory Visit: Payer: Self-pay

## 2018-11-24 ENCOUNTER — Ambulatory Visit (HOSPITAL_COMMUNITY): Payer: BC Managed Care – PPO

## 2018-11-24 DIAGNOSIS — N63 Unspecified lump in unspecified breast: Secondary | ICD-10-CM

## 2018-11-24 DIAGNOSIS — N6324 Unspecified lump in the left breast, lower inner quadrant: Secondary | ICD-10-CM | POA: Insufficient documentation

## 2018-12-21 ENCOUNTER — Other Ambulatory Visit: Payer: Self-pay

## 2018-12-21 DIAGNOSIS — Z20828 Contact with and (suspected) exposure to other viral communicable diseases: Secondary | ICD-10-CM | POA: Diagnosis not present

## 2018-12-21 DIAGNOSIS — Z20822 Contact with and (suspected) exposure to covid-19: Secondary | ICD-10-CM

## 2018-12-22 LAB — NOVEL CORONAVIRUS, NAA: SARS-CoV-2, NAA: NOT DETECTED

## 2019-01-15 ENCOUNTER — Other Ambulatory Visit: Payer: Self-pay | Admitting: Internal Medicine

## 2019-01-21 ENCOUNTER — Other Ambulatory Visit: Payer: Self-pay | Admitting: Internal Medicine

## 2019-01-25 ENCOUNTER — Ambulatory Visit (INDEPENDENT_AMBULATORY_CARE_PROVIDER_SITE_OTHER): Payer: BC Managed Care – PPO | Admitting: Internal Medicine

## 2019-01-25 ENCOUNTER — Encounter: Payer: Self-pay | Admitting: Internal Medicine

## 2019-01-25 DIAGNOSIS — M5440 Lumbago with sciatica, unspecified side: Secondary | ICD-10-CM | POA: Diagnosis not present

## 2019-01-25 DIAGNOSIS — I10 Essential (primary) hypertension: Secondary | ICD-10-CM | POA: Diagnosis not present

## 2019-01-25 DIAGNOSIS — K649 Unspecified hemorrhoids: Secondary | ICD-10-CM | POA: Diagnosis not present

## 2019-01-25 DIAGNOSIS — F410 Panic disorder [episodic paroxysmal anxiety] without agoraphobia: Secondary | ICD-10-CM

## 2019-01-25 DIAGNOSIS — M2550 Pain in unspecified joint: Secondary | ICD-10-CM

## 2019-01-25 DIAGNOSIS — G8929 Other chronic pain: Secondary | ICD-10-CM

## 2019-01-25 DIAGNOSIS — E559 Vitamin D deficiency, unspecified: Secondary | ICD-10-CM

## 2019-01-25 MED ORDER — TRIAMCINOLONE ACETONIDE 0.1 % EX OINT: 1.0000 "application " | TOPICAL_OINTMENT | Freq: Four times a day (QID) | CUTANEOUS | 1 refills | Status: DC

## 2019-01-25 MED ORDER — HYDROCODONE-ACETAMINOPHEN 5-325 MG PO TABS: 1.0000 | ORAL_TABLET | Freq: Four times a day (QID) | ORAL | 0 refills | Status: DC | PRN

## 2019-01-25 MED ORDER — ESCITALOPRAM OXALATE 20 MG PO TABS: 20.0000 mg | ORAL_TABLET | Freq: Every day | ORAL | 1 refills | Status: DC

## 2019-01-25 MED ORDER — ALPRAZOLAM 1 MG PO TABS: ORAL_TABLET | ORAL | 2 refills | Status: DC

## 2019-01-25 NOTE — Assessment & Plan Note (Addendum)
probable thrombosed hemorrhoid To ER if worse Triamc oint qid

## 2019-01-25 NOTE — Assessment & Plan Note (Signed)
Paxil d/c. Start Lexapro  Xanax prn  Potential benefits of a long term benzodiazepines  use as well as potential risks  and complications were explained to the patient and were aknowledged.

## 2019-01-25 NOTE — Assessment & Plan Note (Signed)
Re-start Vit D

## 2019-01-25 NOTE — Assessment & Plan Note (Signed)
Worse Will repeat labs Norco prn

## 2019-01-25 NOTE — Assessment & Plan Note (Signed)
Low salt diet

## 2019-01-25 NOTE — Progress Notes (Signed)
Virtual Visit via Video Note  I connected with Stephanie Harper on 01/25/19 at  2:00 PM EST by a video enabled telemedicine application and verified that I am speaking with the correct person using two identifiers.   I discussed the limitations of evaluation and management by telemedicine and the availability of in person appointments. The patient expressed understanding and agreed to proceed.  History of Present Illness: We need to follow-up on LBP, anxiety and depression f/u.  Stephanie Harper is complaining of "head shocks" all the time.  She used to have them from Paxil withdrawal in the past.  Now she is actually higher dose of Paxil.  She is complaining a little a lot of pain in the joints especially in her left hand.  She is not taking vitamin D.  She is complaining of hemorrhoid.  It is been of a size of a finger phalanx.  There has been no runny nose, cough, chest pain, shortness of breath, abdominal pain, skin rashes.   Observations/Objective: The patient appears to be in no acute distress, looks ok.  Assessment and Plan:  See my Assessment and Plan. Follow Up Instructions:    I discussed the assessment and treatment plan with the patient. The patient was provided an opportunity to ask questions and all were answered. The patient agreed with the plan and demonstrated an understanding of the instructions.   The patient was advised to call back or seek an in-person evaluation if the symptoms worsen or if the condition fails to improve as anticipated.  I provided face-to-face time during this encounter. We were at different locations.   Walker Kehr, MD

## 2019-01-25 NOTE — Assessment & Plan Note (Signed)
Norco  Potential benefits of a long term opioids use as well as potential risks (i.e. addiction risk, apnea etc) and complications (i.e. Somnolence, constipation and others) were explained to the patient and were aknowledged.

## 2019-01-26 NOTE — Addendum Note (Signed)
Addended by: Karren Cobble on: 01/26/2019 02:07 PM   Modules accepted: Orders

## 2019-03-08 DIAGNOSIS — E559 Vitamin D deficiency, unspecified: Secondary | ICD-10-CM | POA: Diagnosis not present

## 2019-03-08 DIAGNOSIS — D513 Other dietary vitamin B12 deficiency anemia: Secondary | ICD-10-CM | POA: Diagnosis not present

## 2019-03-08 DIAGNOSIS — M2550 Pain in unspecified joint: Secondary | ICD-10-CM | POA: Diagnosis not present

## 2019-03-08 DIAGNOSIS — K649 Unspecified hemorrhoids: Secondary | ICD-10-CM | POA: Diagnosis not present

## 2019-03-09 LAB — HEPATIC FUNCTION PANEL
AG Ratio: 1.5 (calc) (ref 1.0–2.5)
ALT: 16 U/L (ref 6–29)
AST: 17 U/L (ref 10–35)
Albumin: 3.9 g/dL (ref 3.6–5.1)
Alkaline phosphatase (APISO): 87 U/L (ref 31–125)
Bilirubin, Direct: 0.1 mg/dL (ref 0.0–0.2)
Globulin: 2.6 g/dL (calc) (ref 1.9–3.7)
Indirect Bilirubin: 0.3 mg/dL (calc) (ref 0.2–1.2)
Total Bilirubin: 0.4 mg/dL (ref 0.2–1.2)
Total Protein: 6.5 g/dL (ref 6.1–8.1)

## 2019-03-09 LAB — TSH: TSH: 3.59 mIU/L

## 2019-03-09 LAB — CBC WITH DIFFERENTIAL/PLATELET
Absolute Monocytes: 927 cells/uL (ref 200–950)
Basophils Absolute: 33 cells/uL (ref 0–200)
Basophils Relative: 0.3 %
Eosinophils Absolute: 120 cells/uL (ref 15–500)
Eosinophils Relative: 1.1 %
HCT: 43.3 % (ref 35.0–45.0)
Hemoglobin: 14.4 g/dL (ref 11.7–15.5)
Lymphs Abs: 2093 cells/uL (ref 850–3900)
MCH: 29.3 pg (ref 27.0–33.0)
MCHC: 33.3 g/dL (ref 32.0–36.0)
MCV: 88 fL (ref 80.0–100.0)
MPV: 10 fL (ref 7.5–12.5)
Monocytes Relative: 8.5 %
Neutro Abs: 7728 cells/uL (ref 1500–7800)
Neutrophils Relative %: 70.9 %
Platelets: 334 10*3/uL (ref 140–400)
RBC: 4.92 10*6/uL (ref 3.80–5.10)
RDW: 12.9 % (ref 11.0–15.0)
Total Lymphocyte: 19.2 %
WBC: 10.9 10*3/uL — ABNORMAL HIGH (ref 3.8–10.8)

## 2019-03-09 LAB — BASIC METABOLIC PANEL
BUN: 12 mg/dL (ref 7–25)
CO2: 31 mmol/L (ref 20–32)
Calcium: 9.4 mg/dL (ref 8.6–10.2)
Chloride: 103 mmol/L (ref 98–110)
Creat: 0.55 mg/dL (ref 0.50–1.10)
Glucose, Bld: 94 mg/dL (ref 65–139)
Potassium: 4.5 mmol/L (ref 3.5–5.3)
Sodium: 141 mmol/L (ref 135–146)

## 2019-03-09 LAB — CK: Total CK: 28 U/L — ABNORMAL LOW (ref 29–143)

## 2019-03-09 LAB — VITAMIN B12: Vitamin B-12: 499 pg/mL (ref 200–1100)

## 2019-03-09 LAB — VITAMIN D 25 HYDROXY (VIT D DEFICIENCY, FRACTURES): Vit D, 25-Hydroxy: 31 ng/mL (ref 30–100)

## 2019-03-09 LAB — RHEUMATOID FACTOR: Rheumatoid fact SerPl-aCnc: <14 IU/mL (ref <14)

## 2019-03-09 LAB — URIC ACID: Uric Acid, Serum: 5.1 mg/dL (ref 2.5–7.0)

## 2019-03-19 ENCOUNTER — Encounter: Payer: Self-pay | Admitting: Orthopedic Surgery

## 2019-03-19 ENCOUNTER — Other Ambulatory Visit: Payer: Self-pay

## 2019-03-19 ENCOUNTER — Ambulatory Visit (INDEPENDENT_AMBULATORY_CARE_PROVIDER_SITE_OTHER): Payer: BC Managed Care – PPO | Admitting: Orthopedic Surgery

## 2019-03-19 ENCOUNTER — Ambulatory Visit: Payer: Self-pay

## 2019-03-19 DIAGNOSIS — G8929 Other chronic pain: Secondary | ICD-10-CM | POA: Diagnosis not present

## 2019-03-19 DIAGNOSIS — M25511 Pain in right shoulder: Secondary | ICD-10-CM

## 2019-03-19 NOTE — Progress Notes (Signed)
Office Visit Note   Patient: Stephanie Harper           Date of Birth: 03/07/1970           MRN: [id number] Visit Date: 03/19/2019 Requested by: Cassandria Anger, MD [place],  [place] [address] PCP: Plotnikov, Evie Lacks, MD  Subjective: Chief Complaint  Patient presents with  . Right Shoulder - Pain    HPI: Stephanie Harper is a 49 year old patient with right shoulder pain.'s been going on for 2 months.  Had acute onset.  No discrete fevers or chills.  Had an open fracture injury treated with fixation and washout and later shoulder replacement.  Reports recent onset of pain.  Denies any history of injury.              ROS: All systems reviewed are negative as they relate to the chief complaint within the history of present illness.  Patient denies  fevers or chills.   Assessment & Plan: Visit Diagnoses:  1. Chronic right shoulder pain     Plan: Impression is right shoulder pain.  Could be infection or rotator cuff pathology.  I do not think is necessarily glenoid wear.  That would be a consideration.  I think the optimal plan at this time would be to draw labs to make sure there is no indolent infection.  Need metal suppressed MRI arthrogram for evaluation of rotator cuff and possible effusion.  If her labs are up and she has an effusion we will need to aspirate the shoulder.  Come back after the scan.  No antibiotics prior to that.  Follow-Up Instructions: Return for after MRI.   Orders:  Orders Placed This Encounter  Procedures  . XR Shoulder Right  . MR SHOULDER RIGHT WO CONTRAST  . C-reactive protein  . Sed Rate (ESR)  . CBC with Differential   No orders of the defined types were placed in this encounter.     Procedures: No procedures performed   Clinical Data: No additional findings.  Objective: Vital Signs: There were no vitals taken for this visit.  Physical Exam:   Constitutional: Patient appears well-developed HEENT:  Head: Normocephalic  Eyes:EOM are normal Neck: Normal range of motion Cardiovascular: Normal rate Pulmonary/chest: Effort normal Neurologic: Patient is alert Skin: Skin is warm Psychiatric: Patient has normal mood and affect    Ortho Exam: Ortho exam demonstrates full active and passive range of motion of the left shoulder.  On the right-hand side she still little bit of coarse grinding with internal/external rotation above 90 degrees of abduction.  Slight weakness to infraspinatus supraspinatus testing right versus left.  No warmth to the right shoulder.  No discrete AC joint tenderness.  No masses lymphadenopathy or skin changes noted in that shoulder girdle region.  Specialty Comments:  No specialty comments available.  Imaging: XR Shoulder Right  Result Date: 03/19/2019 AP outlet and axillary right shoulder reviewed.  Humeral head hemiarthroplasty in good position alignment.  No significant erosion around the proximal humerus.  Humeral head is centered on the glenoid.  No evidence of osteomyelitis    PMFS History: Patient Active Problem List   Diagnosis Date Noted  . Hemorrhoids 01/25/2019  . Leukocytosis 09/07/2018  . Palpitations 01/19/2018  . Heat stroke 07/22/2016  . Vitamin D deficiency 07/22/2014  . Well adult exam 04/26/2014  . Rash and nonspecific skin eruption 01/25/2014  . MDD (major depressive disorder) 06/17/2013  . GAD (generalized anxiety disorder) 06/17/2013  .  Orthostatic lightheadedness 06/09/2012  . Venous insufficiency of leg 03/04/2012  . Lapband APS Jan 2014 01/21/2012  . Arthralgia 10/04/2011  . Night sweats 06/05/2010  . ANAL OR RECTAL PAIN 01/05/2010  . HEMATOCHEZIA 01/05/2010  . ELBOW PAIN 10/31/2009  . Acute upper respiratory infection 12/06/2008  . SHOULDER PAIN 12/06/2008  . CONTACT DERMATITIS 06/03/2008  . OBESITY, MORBID 05/04/2008  . TOBACCO USE DISORDER/SMOKER-SMOKING CESSATION DISCUSSED 07/28/2007  . Chronic anxiety 12/26/2006  . PANIC ATTACK  12/26/2006  . Essential hypertension 12/26/2006  . Asthma 12/26/2006  . Depression 08/28/2006  . LOW BACK PAIN 08/28/2006   Past Medical History:  Diagnosis Date  . Anal fissure 2012   Dr Ardis Hughs  . Anxiety   . Asthma    COLD WEATHER RELATED  . Baker's cyst of knee    Right knee  . Complication of anesthesia    PANIC ATTACK RIGHT BEFORE SHOULDER REPLACMENT SURGERY AT [facility]; PT STATES CLAUSTROPHOBIA AND DOES NOT WANT TO BE AWARE OF BEING STRAPPED DOWN IN OR  . Depression   . HTN (hypertension)   . LBP (low back pain)   . Obesity   . Osteomyelitis ([place])    at [facility]  . Pain    HX OF BILATERAL SHOULDER SURGERIES-CHRONIC SHOULDER PAIN ;  HX OF BILATERAL KNEE PAIN  . Sepsis(995.91) 2005   From shoulder replacement surgery  . Shoulder fracture, left     Family History  Problem Relation Age of Onset  . Arthritis Mother   . Diabetes Mother   . Hyperlipidemia Mother   . Hypertension Mother   . Arthritis Father   . Diabetes Father   . Hyperlipidemia Father   . Hypertension Father   . Heart disease Father   . Coronary artery disease Other   . Hyperlipidemia Other   . Hypertension Other   . Bipolar disorder Brother   . Anxiety disorder Neg Hx   . Depression Neg Hx   . Dementia Neg Hx   . Alcohol abuse Neg Hx   . Drug abuse Neg Hx   . Schizophrenia Neg Hx     Past Surgical History:  Procedure Laterality Date  . APPENDECTOMY    . BREATH TEK H PYLORI  07/15/2011   Procedure: BREATH TEK H PYLORI;  Surgeon: Pedro Earls, MD;  Location: [facility];  Service: General;  Laterality: N/A;  . CHOLECYSTECTOMY    . LAPAROSCOPIC GASTRIC BANDING  01/21/2012   Procedure: LAPAROSCOPIC GASTRIC BANDING;  Surgeon: Pedro Earls, MD;  Location: [facility];  Service: General;  Laterality: N/A;  . LASER ABLATION    . MESH APPLIED TO LAP PORT  01/21/2012   Procedure: MESH APPLIED TO LAP PORT;  Surgeon: Pedro Earls, MD;  Location: [facility];  Service: General;;  . PLANTAR FASCIA RELEASE  1996   . TONSILLECTOMY    . TOTAL SHOULDER REPLACEMENT     Right  . TUBAL LIGATION     UTERINE ABLATION WAS DONE AT THE TIME OF TUBAL LIGATION   Social History   Occupational History  . Occupation: Arboriculturist: UNEMPLOYED  Tobacco Use  . Smoking status: Current Every Day Smoker    Packs/day: 1.00    Years: 31.00    Pack years: 31.00    Types: Cigarettes    Start date: 08/12/1980  . Smokeless tobacco: Never Used  . Tobacco comment: WAS SMOKING 2 PPD-HAS CUT BACK TO 1 PPD OR LESS  Substance and Sexual Activity  .  Alcohol use: No  . Drug use: No  . Sexual activity: Yes

## 2019-03-20 LAB — CBC WITH DIFFERENTIAL/PLATELET
Absolute Monocytes: 747 cells/uL (ref 200–950)
Basophils Absolute: 40 cells/uL (ref 0–200)
Basophils Relative: 0.4 %
Eosinophils Absolute: 91 cells/uL (ref 15–500)
Eosinophils Relative: 0.9 %
HCT: 46.5 % — ABNORMAL HIGH (ref 35.0–45.0)
Hemoglobin: 15.1 g/dL (ref 11.7–15.5)
Lymphs Abs: 1889 cells/uL (ref 850–3900)
MCH: 30 pg (ref 27.0–33.0)
MCHC: 32.5 g/dL (ref 32.0–36.0)
MCV: 92.4 fL (ref 80.0–100.0)
MPV: 10.9 fL (ref 7.5–12.5)
Monocytes Relative: 7.4 %
Neutro Abs: 7333 cells/uL (ref 1500–7800)
Neutrophils Relative %: 72.6 %
Platelets: 305 10*3/uL (ref 140–400)
RBC: 5.03 10*6/uL (ref 3.80–5.10)
RDW: 13.4 % (ref 11.0–15.0)
Total Lymphocyte: 18.7 %
WBC: 10.1 10*3/uL (ref 3.8–10.8)

## 2019-03-20 LAB — C-REACTIVE PROTEIN: CRP: 31.6 mg/L — ABNORMAL HIGH (ref <8.0)

## 2019-03-20 LAB — SEDIMENTATION RATE

## 2019-03-20 NOTE — Progress Notes (Signed)
See results

## 2019-03-22 ENCOUNTER — Encounter: Payer: Self-pay | Admitting: Orthopedic Surgery

## 2019-03-22 NOTE — Telephone Encounter (Signed)
Yes infection labs slightly elevated

## 2019-03-25 ENCOUNTER — Telehealth: Payer: Self-pay | Admitting: Internal Medicine

## 2019-03-25 ENCOUNTER — Other Ambulatory Visit: Payer: Self-pay

## 2019-03-25 NOTE — Telephone Encounter (Signed)
New Message:   1.Medication Requested: HYDROcodone-acetaminophen (NORCO/VICODIN) 5-325 MG tablet 2. Pharmacy (Name, Street, [place]): [id number] [place] On Med List: Yes  4. Last Visit with PCP: 01/25/19  5. Next visit date with PCP:04/21/19   Agent: Please be advised that RX refills may take up to 3 business days. We ask that you follow-up with your pharmacy.

## 2019-03-25 NOTE — Telephone Encounter (Signed)
See [employer], RX request sent through there

## 2019-03-26 ENCOUNTER — Other Ambulatory Visit: Payer: Self-pay | Admitting: Internal Medicine

## 2019-03-27 NOTE — Telephone Encounter (Signed)
Please send rx for the hydrocodone.   Contacted pharmacy and they stated that they did not receive the rx for March.   Pt contacted and informed of same.

## 2019-03-28 MED ORDER — HYDROCODONE-ACETAMINOPHEN 5-325 MG PO TABS: 1.0000 | ORAL_TABLET | Freq: Four times a day (QID) | ORAL | 0 refills | Status: DC | PRN

## 2019-04-15 ENCOUNTER — Other Ambulatory Visit: Payer: Self-pay | Admitting: Internal Medicine

## 2019-04-17 ENCOUNTER — Other Ambulatory Visit: Payer: Self-pay

## 2019-04-17 ENCOUNTER — Ambulatory Visit
Admission: RE | Admit: 2019-04-17 | Discharge: 2019-04-17 | Disposition: A | Discharge status: Home / Self Care | Payer: BC Managed Care – PPO | Source: Ambulatory Visit | Attending: Orthopedic Surgery | Admitting: Orthopedic Surgery

## 2019-04-17 DIAGNOSIS — M25511 Pain in right shoulder: Secondary | ICD-10-CM

## 2019-04-17 DIAGNOSIS — G8929 Other chronic pain: Secondary | ICD-10-CM

## 2019-04-19 ENCOUNTER — Other Ambulatory Visit: Payer: Self-pay

## 2019-04-19 ENCOUNTER — Ambulatory Visit (INDEPENDENT_AMBULATORY_CARE_PROVIDER_SITE_OTHER): Payer: BC Managed Care – PPO | Admitting: Orthopedic Surgery

## 2019-04-19 DIAGNOSIS — M25511 Pain in right shoulder: Secondary | ICD-10-CM | POA: Diagnosis not present

## 2019-04-19 DIAGNOSIS — G8929 Other chronic pain: Secondary | ICD-10-CM

## 2019-04-20 ENCOUNTER — Encounter: Payer: Self-pay | Admitting: Orthopedic Surgery

## 2019-04-20 NOTE — Progress Notes (Signed)
Office Visit Note   Patient: Stephanie Harper           Date of Birth: 06-11-1970           MRN: [id number] Visit Date: 04/19/2019 Requested by: Cassandria Anger, MD [place],  [place] [address] PCP: Plotnikov, Evie Lacks, MD  Subjective: Chief Complaint  Patient presents with  . Follow-up    HPI: [name] is a patient with right shoulder pain.  She had fracture with fixation and infection about 15 years ago.  Underwent hemiarthroplasty about 10 years ago.  Has done well with that until recent onset of atraumatic pain which she localizes in the anterior aspect of the shoulder.  No systemic symptoms.  Last clinic visit blood work was initiated for infection evaluation.  CRP was 31.  White count was normal.  Advil provides some relief.  She has had an MRI scan which is reviewed which essentially was obtained to evaluate the rotator cuff if possible as well as evaluate the shoulder for any effusion.  No effusion was present.  Plain radiographs show no evidence of loosening.            ROS: All systems reviewed are negative as they relate to the chief complaint within the history of present illness.  Patient denies  fevers or chills.   Assessment & Plan: Visit Diagnoses:  1. Chronic right shoulder pain     Plan: Impression is right shoulder pain many years status post hemiarthroplasty.  Differential diagnosis would be indolent infection versus glenoid wear versus rotator cuff pathology and pain.  I would like to send her to get shoulder joint aspiration with saline lavage.  Would also like to have a subsequent arthrogram done at the same time just to evaluate for possible rotator cuff pathology.  That fluid will need to be sent for long-term incubation for P.  Acnes.  If that does not grow out anything then we may need to consider arthroscopic evaluation of both the glenoid joint as well as evaluation to obtain some tissue cultures.  Follow-Up Instructions: No follow-ups on  file.   Orders:  Orders Placed This Encounter  Procedures  . Ambulatory referral to Physical Medicine Rehab   No orders of the defined types were placed in this encounter.     Procedures: No procedures performed   Clinical Data: No additional findings.  Objective: Vital Signs: There were no vitals taken for this visit.  Physical Exam:   Constitutional: Patient appears well-developed HEENT:  Head: Normocephalic Eyes:EOM are normal Neck: Normal range of motion Cardiovascular: Normal rate Pulmonary/chest: Effort normal Neurologic: Patient is alert Skin: Skin is warm Psychiatric: Patient has normal mood and affect    Ortho Exam: Ortho exam demonstrates pretty reasonable passive range of motion.  Cuff strength feels intact.  Most of her pain is anterior.  The shoulder itself is not warm on the right-hand side and there is no lymphadenopathy.  No other evidence of grinding crepitus or instability noted in the right shoulder region.  Specialty Comments:  No specialty comments available.  Imaging: No results found.   PMFS History: Patient Active Problem List   Diagnosis Date Noted  . Hemorrhoids 01/25/2019  . Leukocytosis 09/07/2018  . Palpitations 01/19/2018  . Heat stroke 07/22/2016  . Vitamin D deficiency 07/22/2014  . Well adult exam 04/26/2014  . Rash and nonspecific skin eruption 01/25/2014  . MDD (major depressive disorder) 06/17/2013  . GAD (generalized anxiety disorder) 06/17/2013  .  Orthostatic lightheadedness 06/09/2012  . Venous insufficiency of leg 03/04/2012  . Lapband APS Jan 2014 01/21/2012  . Arthralgia 10/04/2011  . Night sweats 06/05/2010  . ANAL OR RECTAL PAIN 01/05/2010  . HEMATOCHEZIA 01/05/2010  . ELBOW PAIN 10/31/2009  . Acute upper respiratory infection 12/06/2008  . SHOULDER PAIN 12/06/2008  . CONTACT DERMATITIS 06/03/2008  . OBESITY, MORBID 05/04/2008  . TOBACCO USE DISORDER/SMOKER-SMOKING CESSATION DISCUSSED 07/28/2007  .  Chronic anxiety 12/26/2006  . PANIC ATTACK 12/26/2006  . Essential hypertension 12/26/2006  . Asthma 12/26/2006  . Depression 08/28/2006  . LOW BACK PAIN 08/28/2006   Past Medical History:  Diagnosis Date  . Anal fissure 2012   Dr Ardis Hughs  . Anxiety   . Asthma    COLD WEATHER RELATED  . Baker's cyst of knee    Right knee  . Complication of anesthesia    PANIC ATTACK RIGHT BEFORE SHOULDER REPLACMENT SURGERY AT [facility]; PT STATES CLAUSTROPHOBIA AND DOES NOT WANT TO BE AWARE OF BEING STRAPPED DOWN IN OR  . Depression   . HTN (hypertension)   . LBP (low back pain)   . Obesity   . Osteomyelitis ([place])    at [facility]  . Pain    HX OF BILATERAL SHOULDER SURGERIES-CHRONIC SHOULDER PAIN ;  HX OF BILATERAL KNEE PAIN  . Sepsis(995.91) 2005   From shoulder replacement surgery  . Shoulder fracture, left     Family History  Problem Relation Age of Onset  . Arthritis Mother   . Diabetes Mother   . Hyperlipidemia Mother   . Hypertension Mother   . Arthritis Father   . Diabetes Father   . Hyperlipidemia Father   . Hypertension Father   . Heart disease Father   . Coronary artery disease Other   . Hyperlipidemia Other   . Hypertension Other   . Bipolar disorder Brother   . Anxiety disorder Neg Hx   . Depression Neg Hx   . Dementia Neg Hx   . Alcohol abuse Neg Hx   . Drug abuse Neg Hx   . Schizophrenia Neg Hx     Past Surgical History:  Procedure Laterality Date  . APPENDECTOMY    . BREATH TEK H PYLORI  07/15/2011   Procedure: BREATH TEK H PYLORI;  Surgeon: Pedro Earls, MD;  Location: [facility];  Service: General;  Laterality: N/A;  . CHOLECYSTECTOMY    . LAPAROSCOPIC GASTRIC BANDING  01/21/2012   Procedure: LAPAROSCOPIC GASTRIC BANDING;  Surgeon: Pedro Earls, MD;  Location: [facility];  Service: General;  Laterality: N/A;  . LASER ABLATION    . MESH APPLIED TO LAP PORT  01/21/2012   Procedure: MESH APPLIED TO LAP PORT;  Surgeon: Pedro Earls, MD;  Location: [facility];   Service: General;;  . PLANTAR FASCIA RELEASE  1996  . TONSILLECTOMY    . TOTAL SHOULDER REPLACEMENT     Right  . TUBAL LIGATION     UTERINE ABLATION WAS DONE AT THE TIME OF TUBAL LIGATION   Social History   Occupational History  . Occupation: Arboriculturist: UNEMPLOYED  Tobacco Use  . Smoking status: Current Every Day Smoker    Packs/day: 1.00    Years: 31.00    Pack years: 31.00    Types: Cigarettes    Start date: 08/12/1980  . Smokeless tobacco: Never Used  . Tobacco comment: WAS SMOKING 2 PPD-HAS CUT BACK TO 1 PPD OR LESS  Substance and Sexual Activity  .  Alcohol use: No  . Drug use: No  . Sexual activity: Yes

## 2019-04-21 ENCOUNTER — Encounter: Payer: Self-pay | Admitting: Internal Medicine

## 2019-04-21 ENCOUNTER — Ambulatory Visit (INDEPENDENT_AMBULATORY_CARE_PROVIDER_SITE_OTHER): Payer: BC Managed Care – PPO | Admitting: Internal Medicine

## 2019-04-21 DIAGNOSIS — F419 Anxiety disorder, unspecified: Secondary | ICD-10-CM

## 2019-04-21 DIAGNOSIS — I10 Essential (primary) hypertension: Secondary | ICD-10-CM | POA: Diagnosis not present

## 2019-04-21 DIAGNOSIS — G8929 Other chronic pain: Secondary | ICD-10-CM

## 2019-04-21 DIAGNOSIS — M5440 Lumbago with sciatica, unspecified side: Secondary | ICD-10-CM | POA: Diagnosis not present

## 2019-04-21 MED ORDER — HYDROCODONE-ACETAMINOPHEN 5-325 MG PO TABS: 1.0000 | ORAL_TABLET | Freq: Four times a day (QID) | ORAL | 0 refills | Status: DC | PRN

## 2019-04-21 MED ORDER — ALPRAZOLAM 1 MG PO TABS: ORAL_TABLET | ORAL | 2 refills | Status: DC

## 2019-04-21 NOTE — Assessment & Plan Note (Signed)
Low salt diet

## 2019-04-21 NOTE — Progress Notes (Signed)
Virtual Visit via Video Note  I connected with Stephanie Harper on 04/21/19 at  2:20 PM EDT by a video enabled telemedicine application and verified that I am speaking with the correct person using two identifiers.   I discussed the limitations of evaluation and management by telemedicine and the availability of in person appointments. The patient expressed understanding and agreed to proceed.  History of Present Illness: We need to follow-up on chronic pain, anxiety. Seeing Dr Marlou Sa for her worsening shoulder pain.   There has been c/o runny nose, cough, chest pain, shortness of breath, arthralgias, skin rashes on legs.   Observations/Objective: The patient appears to be in no acute distress, looks well.  Assessment and Plan:  See my Assessment and Plan. Follow Up Instructions:    I discussed the assessment and treatment plan with the patient. The patient was provided an opportunity to ask questions and all were answered. The patient agreed with the plan and demonstrated an understanding of the instructions.   The patient was advised to call back or seek an in-person evaluation if the symptoms worsen or if the condition fails to improve as anticipated.  I provided face-to-face time during this encounter. We were at different locations.   Walker Kehr, MD

## 2019-04-21 NOTE — Assessment & Plan Note (Signed)
Norco  Potential benefits of a long term opioids use as well as potential risks (i.e. addiction risk, apnea etc) and complications (i.e. Somnolence, constipation and others) were explained to the patient and were aknowledged.

## 2019-04-21 NOTE — Assessment & Plan Note (Signed)
Trelegy sample - use qd

## 2019-04-21 NOTE — Assessment & Plan Note (Signed)
Xanax prn  Potential benefits of a long term benzodiazepines  use as well as potential risks  and complications were explained to the patient and were aknowledged.

## 2019-04-26 ENCOUNTER — Other Ambulatory Visit: Payer: Self-pay | Admitting: Internal Medicine

## 2019-04-30 ENCOUNTER — Ambulatory Visit (INDEPENDENT_AMBULATORY_CARE_PROVIDER_SITE_OTHER): Payer: BC Managed Care – PPO | Admitting: Physical Medicine and Rehabilitation

## 2019-04-30 ENCOUNTER — Encounter: Payer: Self-pay | Admitting: Physical Medicine and Rehabilitation

## 2019-04-30 ENCOUNTER — Ambulatory Visit: Payer: BC Managed Care – PPO

## 2019-04-30 ENCOUNTER — Other Ambulatory Visit: Payer: Self-pay

## 2019-04-30 DIAGNOSIS — G8929 Other chronic pain: Secondary | ICD-10-CM

## 2019-04-30 DIAGNOSIS — M25511 Pain in right shoulder: Secondary | ICD-10-CM

## 2019-04-30 NOTE — Progress Notes (Signed)
 .  Numeric Pain Rating Scale and Functional Assessment Average Pain 7   In the last MONTH (on 0-10 scale) has pain interfered with the following?  1. General activity like being  able to carry out your everyday physical activities such as walking, climbing stairs, carrying groceries, or moving a chair?  Rating(9)    -Dye Allergies.

## 2019-04-30 NOTE — Progress Notes (Signed)
   Stephanie Harper - 49 y.o. female MRN [id number]  Date of birth: 09-18-1970  Office Visit Note: Visit Date: 04/30/2019 PCP: Cassandria Anger, MD Referred by: Cassandria Anger, MD  Subjective: Chief Complaint  Patient presents with  . Right Shoulder - Pain  . Right Upper Arm - Pain   HPI:  Stephanie Harper is a 49 y.o. female who comes in today At the request of Dr. Anderson Malta for right glenohumeral joint aspiration.  Patient has prior hemiarthroplasty and several surgeries on the right shoulder with also some trauma to the arm as well on multiple occasions.  She has had infection in the past.  ROS Otherwise per HPI.  Assessment & Plan: Visit Diagnoses:  1. Chronic right shoulder pain     Plan: Findings:  As per the procedure note be joint was aspirated using fluoroscopic guidance into the inferior portion of the juncture of the joint and the prosthetic head.  We maneuvered in different locations up and down that region with good flow of contrast into the joint.  We were unable to get any joint fluid aspirate we did washout with 5 mL of normal saline and still could not really aspirate any fluid.    Meds & Orders: No orders of the defined types were placed in this encounter.   Orders Placed This Encounter  Procedures  . Large Joint Inj: R glenohumeral  . XR C-ARM NO REPORT    Follow-up: Return for Anderson Malta, MD.   Procedures: Large Joint Inj: R glenohumeral on 04/30/2019 9:03 AM Indications: pain and diagnostic evaluation Details: 22 G 3.5 in needle, fluoroscopy-guided anteromedial approach  Arthrogram: No  Medications: (Sterile normal saline) Outcome: tolerated well, no immediate complications  There was excellent flow of contrast producing a partial arthrogram of the glenohumeral joint.  Fluoroscopic imaging was utilized to position the needle tip in the inferior portion of the juncture of the joint and the prosthetic head.  We maneuvered in different  locations up and down that region with good flow of contrast into the joint.  We were unable to get any joint fluid aspirate we did washout with 5 mL of normal saline and still could not really aspirate any fluid.  Procedure, treatment alternatives, risks and benefits explained, specific risks discussed. Consent was given by the patient. Immediately prior to procedure a time out was called to verify the correct patient, procedure, equipment, support staff and site/side marked as required. Patient was prepped and draped in the usual sterile fashion.      No notes on file   Clinical History: No specialty comments available.     Objective:  VS:  HT:    WT:   BMI:     BP:   HR: bpm  TEMP: ( )  RESP:  Physical Exam  Ortho Exam Imaging: XR C-ARM NO REPORT  Result Date: 04/30/2019 Please see Notes tab for imaging impression.

## 2019-05-05 ENCOUNTER — Encounter: Payer: Self-pay | Admitting: Orthopedic Surgery

## 2019-05-07 NOTE — Telephone Encounter (Signed)
Neck step in the work-up is arthroscopy with tissue samples.  Or we could wait it out and see if this improves on its own.  Without an effusion in the shoulder joint the chance of infection is lower

## 2019-06-15 ENCOUNTER — Other Ambulatory Visit: Payer: Self-pay | Admitting: Internal Medicine

## 2019-06-15 MED ORDER — FUROSEMIDE 40 MG PO TABS: 40.0000 mg | ORAL_TABLET | Freq: Every day | ORAL | 3 refills | Status: AC | PRN

## 2019-07-01 ENCOUNTER — Ambulatory Visit (INDEPENDENT_AMBULATORY_CARE_PROVIDER_SITE_OTHER): Payer: BC Managed Care – PPO | Admitting: Orthopedic Surgery

## 2019-07-01 ENCOUNTER — Encounter: Payer: Self-pay | Admitting: Orthopedic Surgery

## 2019-07-01 DIAGNOSIS — M25511 Pain in right shoulder: Secondary | ICD-10-CM

## 2019-07-01 DIAGNOSIS — G8929 Other chronic pain: Secondary | ICD-10-CM | POA: Diagnosis not present

## 2019-07-01 NOTE — Progress Notes (Signed)
Office Visit Note   Patient: Stephanie Harper           Date of Birth: 09-Nov-1970           MRN: [id number] Visit Date: 07/01/2019 Requested by: Cassandria Anger, MD [place],  [place] [address] PCP: Plotnikov, Evie Lacks, MD  Subjective: Chief Complaint  Patient presents with  . Right Shoulder - Follow-up    HPI: Stephanie Harper is a patient with right shoulder pain.  Since I have seen her she saw Dr. Ernestina Patches who attempted an aspiration of the shoulder.  Even with injection of saline cannot really get any fluid out of the shoulder joint.  CRP elevated at 31 last clinic visit.  Could not get the sed rate done.  Not enough blood.  She continues to have some pain on a daily basis.  She has a well-functioning hemiarthroplasty which is lasted for about 15 years.  Since I have seen her her father has been hospitalized and required bypass surgery.  She denies any fevers or chills or flulike symptoms.  Does report pain in that shoulder region.              ROS: All systems reviewed are negative as they relate to the chief complaint within the history of present illness.  Patient denies  fevers or chills.   Assessment & Plan: Visit Diagnoses:  1. Chronic right shoulder pain     Plan: Impression is right shoulder pain.  This could be from occult infection versus progressive arthritis in the glenoid versus some type of rotator cuff problem.  MRI scan was unrevealing in that regard in terms of diagnostic help other than demonstrating that no effusion was present.  Plan is to repeat the blood work today.  If it is elevated we may need to consider arthroscopic tissue biopsy.  We can also potentially look at the glenoid at that point.  Follow-up after that lab work is been done we can make a definitive plan.  Follow-Up Instructions: Return if symptoms worsen or fail to improve.   Orders:  Orders Placed This Encounter  Procedures  . Sed Rate (ESR)  . C-reactive protein  . CBC with  Differential   No orders of the defined types were placed in this encounter.     Procedures: No procedures performed   Clinical Data: No additional findings.  Objective: Vital Signs: There were no vitals taken for this visit.  Physical Exam:  Constitutional: Patient appears well-developed HEENT:  Head: Normocephalic Eyes:EOM are normal Neck: Normal range of motion Cardiovascular: Normal rate Pulmonary/chest: Effort normal Neurologic: Patient is alert Skin: Skin is warm Psychiatric: Patient has normal mood and affect    Ortho Exam: Ortho exam demonstrates forward flexion abduction both above 90 degrees with that right shoulder.  No warmth to the incision.  No axillary lymphadenopathy is present.  Rotator cuff strength is pretty reasonable to infraspinatus supraspinatus and subscap muscle testing.  Perhaps slightly weaker to supraspinatus testing but no coarse grinding or crepitus noted in that shoulder girdle region.  Specialty Comments:  No specialty comments available.  Imaging: No results found.   PMFS History: Patient Active Problem List   Diagnosis Date Noted  . Hemorrhoids 01/25/2019  . Leukocytosis 09/07/2018  . Palpitations 01/19/2018  . Heat stroke 07/22/2016  . Vitamin D deficiency 07/22/2014  . Well adult exam 04/26/2014  . Rash and nonspecific skin eruption 01/25/2014  . MDD (major depressive disorder) 06/17/2013  . GAD (  generalized anxiety disorder) 06/17/2013  . Orthostatic lightheadedness 06/09/2012  . Venous insufficiency of leg 03/04/2012  . Lapband APS Jan 2014 01/21/2012  . Arthralgia 10/04/2011  . Night sweats 06/05/2010  . ANAL OR RECTAL PAIN 01/05/2010  . HEMATOCHEZIA 01/05/2010  . ELBOW PAIN 10/31/2009  . Acute upper respiratory infection 12/06/2008  . SHOULDER PAIN 12/06/2008  . CONTACT DERMATITIS 06/03/2008  . OBESITY, MORBID 05/04/2008  . TOBACCO USE DISORDER/SMOKER-SMOKING CESSATION DISCUSSED 07/28/2007  . Chronic anxiety  12/26/2006  . PANIC ATTACK 12/26/2006  . Essential hypertension 12/26/2006  . Asthma 12/26/2006  . Depression 08/28/2006  . LOW BACK PAIN 08/28/2006   Past Medical History:  Diagnosis Date  . Anal fissure 2012   Dr Ardis Hughs  . Anxiety   . Asthma    COLD WEATHER RELATED  . Baker's cyst of knee    Right knee  . Complication of anesthesia    PANIC ATTACK RIGHT BEFORE SHOULDER REPLACMENT SURGERY AT [facility]; PT STATES CLAUSTROPHOBIA AND DOES NOT WANT TO BE AWARE OF BEING STRAPPED DOWN IN OR  . Depression   . HTN (hypertension)   . LBP (low back pain)   . Obesity   . Osteomyelitis ([place])    at [facility]  . Pain    HX OF BILATERAL SHOULDER SURGERIES-CHRONIC SHOULDER PAIN ;  HX OF BILATERAL KNEE PAIN  . Sepsis(995.91) 2005   From shoulder replacement surgery  . Shoulder fracture, left     Family History  Problem Relation Age of Onset  . Arthritis Mother   . Diabetes Mother   . Hyperlipidemia Mother   . Hypertension Mother   . Arthritis Father   . Diabetes Father   . Hyperlipidemia Father   . Hypertension Father   . Heart disease Father   . Coronary artery disease Other   . Hyperlipidemia Other   . Hypertension Other   . Bipolar disorder Brother   . Anxiety disorder Neg Hx   . Depression Neg Hx   . Dementia Neg Hx   . Alcohol abuse Neg Hx   . Drug abuse Neg Hx   . Schizophrenia Neg Hx     Past Surgical History:  Procedure Laterality Date  . APPENDECTOMY    . BREATH TEK H PYLORI  07/15/2011   Procedure: BREATH TEK H PYLORI;  Surgeon: Pedro Earls, MD;  Location: [facility];  Service: General;  Laterality: N/A;  . CHOLECYSTECTOMY    . LAPAROSCOPIC GASTRIC BANDING  01/21/2012   Procedure: LAPAROSCOPIC GASTRIC BANDING;  Surgeon: Pedro Earls, MD;  Location: [facility];  Service: General;  Laterality: N/A;  . LASER ABLATION    . MESH APPLIED TO LAP PORT  01/21/2012   Procedure: MESH APPLIED TO LAP PORT;  Surgeon: Pedro Earls, MD;  Location: [facility];  Service: General;;  .  PLANTAR FASCIA RELEASE  1996  . TONSILLECTOMY    . TOTAL SHOULDER REPLACEMENT     Right  . TUBAL LIGATION     UTERINE ABLATION WAS DONE AT THE TIME OF TUBAL LIGATION   Social History   Occupational History  . Occupation: Arboriculturist: UNEMPLOYED  Tobacco Use  . Smoking status: Current Every Day Smoker    Packs/day: 1.00    Years: 31.00    Pack years: 31.00    Types: Cigarettes    Start date: 08/12/1980  . Smokeless tobacco: Never Used  . Tobacco comment: WAS SMOKING 2 PPD-HAS CUT BACK TO 1 PPD OR LESS  Substance and Sexual Activity  . Alcohol use: No  . Drug use: No  . Sexual activity: Yes

## 2019-07-02 LAB — CBC WITH DIFFERENTIAL/PLATELET
Absolute Monocytes: 907 cells/uL (ref 200–950)
Basophils Absolute: 32 cells/uL (ref 0–200)
Basophils Relative: 0.3 %
Eosinophils Absolute: 97 cells/uL (ref 15–500)
Eosinophils Relative: 0.9 %
HCT: 43.2 % (ref 35.0–45.0)
Hemoglobin: 14.6 g/dL (ref 11.7–15.5)
Lymphs Abs: 2279 cells/uL (ref 850–3900)
MCH: 30 pg (ref 27.0–33.0)
MCHC: 33.8 g/dL (ref 32.0–36.0)
MCV: 88.9 fL (ref 80.0–100.0)
MPV: 10 fL (ref 7.5–12.5)
Monocytes Relative: 8.4 %
Neutro Abs: 7484 cells/uL (ref 1500–7800)
Neutrophils Relative %: 69.3 %
Platelets: 306 10*3/uL (ref 140–400)
RBC: 4.86 10*6/uL (ref 3.80–5.10)
RDW: 13.1 % (ref 11.0–15.0)
Total Lymphocyte: 21.1 %
WBC: 10.8 10*3/uL (ref 3.8–10.8)

## 2019-07-02 LAB — SEDIMENTATION RATE: Sed Rate: 28 mm/h — ABNORMAL HIGH (ref 0–20)

## 2019-07-02 LAB — C-REACTIVE PROTEIN: CRP: 37 mg/L — ABNORMAL HIGH (ref <8.0)

## 2019-07-02 NOTE — Progress Notes (Signed)
Lab evidence not compelling for infection.  I think it may be worth doing an arthroscopy and getting 5 tissue samples and sending that for analysis.  I would really want to make sure that infection is not present before doing any type of revision arthroplasty.

## 2019-07-15 NOTE — Progress Notes (Signed)
I called and talked with Stephanie Harper.  Essentially the literature shows that tissue samples x5 has a relatively good negative predictive value for ruling out infection.  I think that is the primary goal at this time with arthroscopy would be to try to rule out infection.  We may also be able to look at the glenoid surface and possible glenoid wear as a cause for her increased pain.  We can also have a good look at the rotator cuff as a potential cause of her pain.  Not really planning any revision surgery at this time but I would want to rule out infection before doing any type of major reconstructive surgery.  The risk and benefits are explained to Stephanie Harper she understands.  All questions answered.  Plan for arthroscopy with tissue sampling to send for culture.

## 2019-07-16 ENCOUNTER — Other Ambulatory Visit: Payer: Self-pay | Admitting: Internal Medicine

## 2019-07-20 ENCOUNTER — Other Ambulatory Visit: Payer: Self-pay

## 2019-07-20 ENCOUNTER — Encounter: Payer: Self-pay | Admitting: Internal Medicine

## 2019-07-20 ENCOUNTER — Ambulatory Visit (INDEPENDENT_AMBULATORY_CARE_PROVIDER_SITE_OTHER): Payer: BC Managed Care – PPO | Admitting: Internal Medicine

## 2019-07-20 DIAGNOSIS — I10 Essential (primary) hypertension: Secondary | ICD-10-CM

## 2019-07-20 DIAGNOSIS — F419 Anxiety disorder, unspecified: Secondary | ICD-10-CM

## 2019-07-20 DIAGNOSIS — M25511 Pain in right shoulder: Secondary | ICD-10-CM

## 2019-07-20 DIAGNOSIS — E559 Vitamin D deficiency, unspecified: Secondary | ICD-10-CM | POA: Diagnosis not present

## 2019-07-20 DIAGNOSIS — M5440 Lumbago with sciatica, unspecified side: Secondary | ICD-10-CM

## 2019-07-20 DIAGNOSIS — G8929 Other chronic pain: Secondary | ICD-10-CM

## 2019-07-20 MED ORDER — HYDROCODONE-ACETAMINOPHEN 5-325 MG PO TABS: 1.0000 | ORAL_TABLET | Freq: Four times a day (QID) | ORAL | 0 refills | Status: DC | PRN

## 2019-07-20 MED ORDER — ALPRAZOLAM 1 MG PO TABS: ORAL_TABLET | ORAL | 2 refills | Status: DC

## 2019-07-20 NOTE — Assessment & Plan Note (Signed)
Norco  Potential benefits of a long term opioids use as well as potential risks (i.e. addiction risk, apnea etc) and complications (i.e. Somnolence, constipation and others) were explained to the patient and were aknowledged.

## 2019-07-20 NOTE — Assessment & Plan Note (Addendum)
NAS diet Furosemide Reduce stress Check BP at home

## 2019-07-20 NOTE — Assessment & Plan Note (Signed)
Xanax prn  Potential benefits of a long term benzodiazepines  use as well as potential risks  and complications were explained to the patient and were aknowledged.

## 2019-07-20 NOTE — Assessment & Plan Note (Signed)
Shoulder surgical bx is pending

## 2019-07-20 NOTE — Assessment & Plan Note (Signed)
Vit D 

## 2019-07-20 NOTE — Progress Notes (Signed)
Subjective:  Patient ID: Stephanie Harper, female    DOB: 02/05/1970  Age: 49 y.o. MRN: [id number]  CC: No chief complaint on file.   HPI [place] presents for HTN, LBP, anxiety f/u C/o shoulder pain  Outpatient Medications Prior to Visit  Medication Sig Dispense Refill  . albuterol (VENTOLIN HFA) 108 (90 Base) MCG/ACT inhaler TAKE 2 PUFFS BY MOUTH EVERY 6 HOURS AS NEEDED 8 g 2  . ALPRAZolam (XANAX) 1 MG tablet TAKE 1 TABLET 3 TIMES A DAY AS NEEDED FOR ANXIETY OR SLEEP 90 tablet 2  . BREO ELLIPTA 100-25 MCG/INH AEPB TAKE 1 PUFF BY MOUTH EVERY DAY 180 each 3  . buPROPion (WELLBUTRIN SR) 100 MG 12 hr tablet TAKE 1 TABLET BY MOUTH TWICE A DAY 180 tablet 1  . Cholecalciferol 1000 UNITS tablet Take 2 tablets (2,000 Units total) by mouth daily. 100 tablet 3  . escitalopram (LEXAPRO) 20 MG tablet TAKE 1 TABLET BY MOUTH EVERY DAY 90 tablet 1  . furosemide (LASIX) 40 MG tablet Take 1 tablet (40 mg total) by mouth daily as needed. 90 tablet 3  . HYDROcodone-acetaminophen (NORCO/VICODIN) 5-325 MG tablet Take 1 tablet by mouth every 6 (six) hours as needed for severe pain. 120 tablet 0  . HYDROcodone-acetaminophen (NORCO/VICODIN) 5-325 MG tablet Take 1 tablet by mouth every 6 (six) hours as needed for severe pain. 120 tablet 0  . HYDROcodone-acetaminophen (NORCO/VICODIN) 5-325 MG tablet Take 1 tablet by mouth every 6 (six) hours as needed for moderate pain or severe pain. 120 tablet 0  . ibuprofen (ADVIL) 600 MG tablet TAKE 1 TABLET (600 MG TOTAL) BY MOUTH 2 (TWO) TIMES DAILY AS NEEDED. 180 tablet 0  . Multiple Vitamin (MULTIVITAMIN) tablet Take 1 tablet by mouth 2 (two) times daily.     . niacinamide 500 MG tablet     . pentoxifylline (TRENTAL) 400 MG CR tablet Take by mouth.    . vitamin B-12 (CYANOCOBALAMIN) 1000 MCG tablet Take 1,000 mcg by mouth daily.     [name] triamcinolone ointment (KENALOG) 0.1 % Apply 1 application topically 4 (four) times daily. (Patient not taking: Reported on  07/20/2019) 80 g 1  . Vitamin D, Ergocalciferol, (DRISDOL) 1.25 MG (50000 UT) CAPS capsule TAKE 1 CAPSULE BY MOUTH ONE TIME PER WEEK (Patient not taking: Reported on 07/20/2019) 12 capsule 1   No facility-administered medications prior to visit.    ROS: Review of Systems  Constitutional: Positive for fatigue. Negative for activity change, appetite change, chills and unexpected weight change.  HENT: Negative for congestion, mouth sores and sinus pressure.   Eyes: Negative for visual disturbance.  Respiratory: Negative for cough and chest tightness.   Gastrointestinal: Negative for abdominal pain and nausea.  Genitourinary: Negative for difficulty urinating, frequency and vaginal pain.  Musculoskeletal: Positive for arthralgias and back pain. Negative for gait problem.  Skin: Negative for pallor and rash.  Neurological: Negative for dizziness, tremors, weakness, numbness and headaches.  Psychiatric/Behavioral: Negative for confusion and sleep disturbance. The patient is nervous/anxious.     Objective:  BP (!) 160/110 (BP Location: Right Arm, Patient Position: Sitting, Cuff Size: Large)   Pulse 78   Temp 98.4 F (36.9 C) (Oral)   Ht 5' 7"  (1.702 m)   Wt (!) 401 lb (181.9 kg)   SpO2 98%   BMI 62.81 kg/m   BP Readings from Last 3 Encounters:  07/20/19 (!) 160/110  09/07/18 130/82  01/19/18 130/82    Wt Readings from Last  3 Encounters:  07/20/19 (!) 401 lb (181.9 kg)  09/07/18 (!) 382 lb (173.3 kg)  01/19/18 (!) 376 lb (170.6 kg)    Physical Exam Constitutional:      General: She is not in acute distress.    Appearance: She is well-developed. She is obese.  HENT:     Head: Normocephalic.     Right Ear: External ear normal.     Left Ear: External ear normal.     Nose: Nose normal.  Eyes:     General:        Right eye: No discharge.        Left eye: No discharge.     Conjunctiva/sclera: Conjunctivae normal.     Pupils: Pupils are equal, round, and reactive to light.    Neck:     Thyroid: No thyromegaly.     Vascular: No JVD.     Trachea: No tracheal deviation.  Cardiovascular:     Rate and Rhythm: Normal rate and regular rhythm.     Heart sounds: Normal heart sounds.  Pulmonary:     Effort: No respiratory distress.     Breath sounds: No stridor. No wheezing.  Abdominal:     General: Bowel sounds are normal. There is no distension.     Palpations: Abdomen is soft. There is no mass.     Tenderness: There is no abdominal tenderness. There is no guarding or rebound.  Musculoskeletal:        General: Tenderness present.     Cervical back: Normal range of motion and neck supple.  Lymphadenopathy:     Cervical: No cervical adenopathy.  Skin:    Findings: No erythema or rash.  Neurological:     Cranial Nerves: No cranial nerve deficit.     Motor: No abnormal muscle tone.     Coordination: Coordination normal.     Deep Tendon Reflexes: Reflexes normal.  Psychiatric:        Behavior: Behavior normal.        Thought Content: Thought content normal.        Judgment: Judgment normal.   LS spine - pain w/ROM  Lab Results  Component Value Date   WBC 10.8 07/01/2019   HGB 14.6 07/01/2019   HCT 43.2 07/01/2019   PLT 306 07/01/2019   GLUCOSE 94 03/08/2019   CHOL 168 08/03/2018   TRIG 134.0 08/03/2018   HDL 45.00 08/03/2018   LDLCALC 96 08/03/2018   ALT 16 03/08/2019   AST 17 03/08/2019   NA 141 03/08/2019   K 4.5 03/08/2019   CL 103 03/08/2019   CREATININE 0.55 03/08/2019   BUN 12 03/08/2019   CO2 31 03/08/2019   TSH 3.59 03/08/2019   INR 1.0 08/09/2007   HGBA1C 5.7 01/19/2016    MR SHOULDER RIGHT WO CONTRAST  Result Date: 04/17/2019 CLINICAL DATA:  Right shoulder pain. History of shoulder arthroplasty EXAM: MRI OF THE RIGHT SHOULDER WITHOUT CONTRAST TECHNIQUE: Multiplanar, multisequence MR imaging of the shoulder was performed. No intravenous contrast was administered. COMPARISON:  X-ray 03/19/2019 FINDINGS: Technical note: Examination  is limited secondary to extensive metallic susceptibility artifact from patient's arthroplasty hardware. Metal artifact reduction sequences were utilized. Rotator cuff: Rotator cuff tendons are largely obscured by susceptibility artifact. Subscapularis and teres minor tendons appear at least partially intact. Supraspinatus and infraspinatus tendons unable to be evaluated. Muscles: Preserved muscle bulk and signal intensity without atrophy or fatty infiltration. Biceps long head: Not visualized, may reflect prior tenodesis or tenotomy. Acromioclavicular  Joint: Unremarkable AC joint. No appreciable fluid is evident within the subacromial-subdeltoid bursa. Glenohumeral Joint and Labrum: Status post right total shoulder arthroplasty. Alignment appears within normal limits within the limitations of this exam. No periprosthetic fluid collection is seen. Bones: No fracture or abnormality of the visualized marrow structures. Other: None. IMPRESSION: 1. Limited examination secondary to extensive metallic susceptibility artifact from patient's arthroplasty hardware. 2. Grossly intact rotator cuff tendons within the limitations of this exam. 3. No rotator cuff muscle atrophy or fatty infiltration. 4. Nonvisualization of the long head of the biceps, may reflect prior tenodesis or tenotomy. Electronically Signed   By: Davina Poke D.O.   On: 04/17/2019 18:42    Assessment & Plan:   Diagnoses and all orders for this visit:  Essential hypertension  Chronic anxiety  Vitamin D deficiency  Chronic midline low back pain with sciatica, sciatica laterality unspecified     No orders of the defined types were placed in this encounter.    Follow-up: Return in about 4 months (around 11/20/2019) for a follow-up visit.  Walker Kehr, MD

## 2019-07-22 ENCOUNTER — Other Ambulatory Visit (HOSPITAL_COMMUNITY): Payer: Medicare Other

## 2019-07-23 ENCOUNTER — Other Ambulatory Visit (HOSPITAL_COMMUNITY)
Admission: RE | Admit: 2019-07-23 | Discharge: 2019-07-23 | Disposition: A | Discharge status: Home / Self Care | Payer: BC Managed Care – PPO | Source: Ambulatory Visit | Attending: Orthopedic Surgery | Admitting: Orthopedic Surgery

## 2019-07-23 ENCOUNTER — Other Ambulatory Visit (HOSPITAL_COMMUNITY): Payer: Medicare Other

## 2019-07-23 DIAGNOSIS — Z20822 Contact with and (suspected) exposure to covid-19: Secondary | ICD-10-CM | POA: Diagnosis not present

## 2019-07-23 DIAGNOSIS — Z01812 Encounter for preprocedural laboratory examination: Secondary | ICD-10-CM | POA: Insufficient documentation

## 2019-07-23 LAB — SARS CORONAVIRUS 2 (TAT 6-24 HRS): SARS Coronavirus 2: NEGATIVE

## 2019-07-26 ENCOUNTER — Encounter (HOSPITAL_COMMUNITY): Payer: Self-pay | Admitting: Orthopedic Surgery

## 2019-07-26 ENCOUNTER — Other Ambulatory Visit: Payer: Self-pay

## 2019-07-26 NOTE — Progress Notes (Addendum)
Spoke with pt for pre-op call. Pt denies cardiac history, has been treated in the past for HTN, but no longer on medication. Taken off after lap band surgery. Pt states she is not diabetic.   Covid test done 07/23/19 and it's negative. Pt states she has been in quarantine since the test was done and she understands that she stays in quarantine until she comes to the hospital.   Pt given visitor policy for surgery patients (only 1 person in lobby). Pt states she will have 2 visitors, I told her that she would need to explain it to the front desk on arrival and they will let her know if that is ok.   ERAS protocol ordered - instructed pt not to eat food after midnight tonight, but may have clear liquids until 11:30 AM tomorrow. Reviewed clear liquids with pt. (Pt lives in [place] so I did not ask her to drive to get an Ensure drink)

## 2019-07-26 NOTE — Progress Notes (Signed)
I emailed Anette Riedel, director of Guest Relations about the request the patient had for 2 visitors to come in with her and wait in the waiting area. She has approved this and I have notified pt. She was most appreciative of this being approved.

## 2019-07-27 ENCOUNTER — Encounter (HOSPITAL_COMMUNITY)
Admission: RE | Disposition: A | Discharge status: Home / Self Care | Payer: Self-pay | Source: Home / Self Care | Attending: Orthopedic Surgery

## 2019-07-27 ENCOUNTER — Ambulatory Visit (HOSPITAL_COMMUNITY): Payer: BC Managed Care – PPO | Admitting: Certified Registered"

## 2019-07-27 ENCOUNTER — Ambulatory Visit (HOSPITAL_COMMUNITY)
Admission: RE | Admit: 2019-07-27 | Discharge: 2019-07-27 | Disposition: A | Discharge status: Home / Self Care | Payer: BC Managed Care – PPO | Attending: Orthopedic Surgery | Admitting: Orthopedic Surgery

## 2019-07-27 ENCOUNTER — Encounter (HOSPITAL_COMMUNITY): Payer: Self-pay | Admitting: Orthopedic Surgery

## 2019-07-27 ENCOUNTER — Other Ambulatory Visit: Payer: Self-pay

## 2019-07-27 DIAGNOSIS — M25511 Pain in right shoulder: Secondary | ICD-10-CM | POA: Diagnosis not present

## 2019-07-27 DIAGNOSIS — M65811 Other synovitis and tenosynovitis, right shoulder: Secondary | ICD-10-CM

## 2019-07-27 DIAGNOSIS — Z6841 Body Mass Index (BMI) 40.0 and over, adult: Secondary | ICD-10-CM | POA: Insufficient documentation

## 2019-07-27 DIAGNOSIS — F17200 Nicotine dependence, unspecified, uncomplicated: Secondary | ICD-10-CM | POA: Diagnosis not present

## 2019-07-27 DIAGNOSIS — Z96611 Presence of right artificial shoulder joint: Secondary | ICD-10-CM | POA: Diagnosis not present

## 2019-07-27 DIAGNOSIS — T8484XA Pain due to internal orthopedic prosthetic devices, implants and grafts, initial encounter: Secondary | ICD-10-CM | POA: Diagnosis not present

## 2019-07-27 DIAGNOSIS — J45909 Unspecified asthma, uncomplicated: Secondary | ICD-10-CM | POA: Diagnosis not present

## 2019-07-27 DIAGNOSIS — I10 Essential (primary) hypertension: Secondary | ICD-10-CM | POA: Diagnosis not present

## 2019-07-27 DIAGNOSIS — G8918 Other acute postprocedural pain: Secondary | ICD-10-CM | POA: Diagnosis not present

## 2019-07-27 HISTORY — DX: Unspecified osteoarthritis, unspecified site: M19.90

## 2019-07-27 HISTORY — PX: SHOULDER ARTHROSCOPY: SHX128

## 2019-07-27 LAB — CBC
HCT: 48.5 % — ABNORMAL HIGH (ref 36.0–46.0)
Hemoglobin: 15.4 g/dL — ABNORMAL HIGH (ref 12.0–15.0)
MCH: 29.3 pg (ref 26.0–34.0)
MCHC: 31.8 g/dL (ref 30.0–36.0)
MCV: 92.4 fL (ref 80.0–100.0)
Platelets: 334 10*3/uL (ref 150–400)
RBC: 5.25 MIL/uL — ABNORMAL HIGH (ref 3.87–5.11)
RDW: 13.9 % (ref 11.5–15.5)
WBC: 10.4 10*3/uL (ref 4.0–10.5)
nRBC: 0 % (ref 0.0–0.2)

## 2019-07-27 LAB — BASIC METABOLIC PANEL
Anion gap: 10 (ref 5–15)
BUN: 6 mg/dL (ref 6–20)
CO2: 28 mmol/L (ref 22–32)
Calcium: 9.3 mg/dL (ref 8.9–10.3)
Chloride: 101 mmol/L (ref 98–111)
Creatinine, Ser: 0.65 mg/dL (ref 0.44–1.00)
GFR calc Af Amer: >60 mL/min (ref >60)
GFR calc non Af Amer: >60 mL/min (ref >60)
Glucose, Bld: 104 mg/dL — ABNORMAL HIGH (ref 70–99)
Potassium: 3.8 mmol/L (ref 3.5–5.1)
Sodium: 139 mmol/L (ref 135–145)

## 2019-07-27 SURGERY — ARTHROSCOPY, SHOULDER
Anesthesia: General | Site: Shoulder | Laterality: Right

## 2019-07-27 MED ORDER — 0.9 % SODIUM CHLORIDE (POUR BTL) OPTIME
TOPICAL | Status: DC | PRN
  Administered 2019-07-27: 1000 mL

## 2019-07-27 MED ORDER — MIDAZOLAM HCL 5 MG/5ML IJ SOLN
INTRAMUSCULAR | Status: DC | PRN
  Administered 2019-07-27: 2 mg via INTRAVENOUS

## 2019-07-27 MED ORDER — FENTANYL CITRATE (PF) 100 MCG/2ML IJ SOLN
INTRAMUSCULAR | Status: DC | PRN
  Administered 2019-07-27: 100 ug via INTRAVENOUS

## 2019-07-27 MED ORDER — DEXTROSE 5 % IV SOLN
INTRAVENOUS | Status: DC | PRN
  Administered 2019-07-27: 3 g via INTRAVENOUS

## 2019-07-27 MED ORDER — ONDANSETRON HCL 4 MG/2ML IJ SOLN: 4.0000 mg | Freq: Once | INTRAMUSCULAR | Status: DC | PRN

## 2019-07-27 MED ORDER — ROCURONIUM BROMIDE 10 MG/ML (PF) SYRINGE
PREFILLED_SYRINGE | INTRAVENOUS | Status: DC | PRN
  Administered 2019-07-27: 60 mg via INTRAVENOUS

## 2019-07-27 MED ORDER — FENTANYL CITRATE (PF) 100 MCG/2ML IJ SOLN
INTRAMUSCULAR | Status: AC
  Administered 2019-07-27: 100 ug via INTRAVENOUS
  Filled 2019-07-27: qty 2

## 2019-07-27 MED ORDER — DEXMEDETOMIDINE (PRECEDEX) IN NS 20 MCG/5ML (4 MCG/ML) IV SYRINGE
PREFILLED_SYRINGE | INTRAVENOUS | Status: AC
  Filled 2019-07-27: qty 10

## 2019-07-27 MED ORDER — LIDOCAINE 2% (20 MG/ML) 5 ML SYRINGE
INTRAMUSCULAR | Status: AC
  Filled 2019-07-27: qty 5

## 2019-07-27 MED ORDER — MIDAZOLAM HCL 2 MG/2ML IJ SOLN
INTRAMUSCULAR | Status: AC
  Administered 2019-07-27: 1 mg via INTRAVENOUS
  Filled 2019-07-27: qty 2

## 2019-07-27 MED ORDER — LACTATED RINGERS IV SOLN: INTRAVENOUS | Status: DC

## 2019-07-27 MED ORDER — METHOCARBAMOL 500 MG PO TABS: 500.0000 mg | ORAL_TABLET | Freq: Three times a day (TID) | ORAL | 0 refills | Status: DC | PRN

## 2019-07-27 MED ORDER — PHENYLEPHRINE 40 MCG/ML (10ML) SYRINGE FOR IV PUSH (FOR BLOOD PRESSURE SUPPORT)
PREFILLED_SYRINGE | INTRAVENOUS | Status: DC | PRN
  Administered 2019-07-27: 200 ug via INTRAVENOUS

## 2019-07-27 MED ORDER — CHLORHEXIDINE GLUCONATE 0.12 % MT SOLN
15.0000 mL | Freq: Once | OROMUCOSAL | Status: AC
  Administered 2019-07-27: 15 mL via OROMUCOSAL
  Filled 2019-07-27: qty 15

## 2019-07-27 MED ORDER — SODIUM CHLORIDE 0.9 % IR SOLN
Status: DC | PRN
  Administered 2019-07-27 (×2): 3000 mL

## 2019-07-27 MED ORDER — OXYCODONE HCL 5 MG PO TABS: 5.0000 mg | ORAL_TABLET | Freq: Three times a day (TID) | ORAL | 0 refills | Status: DC | PRN

## 2019-07-27 MED ORDER — MIDAZOLAM HCL 2 MG/2ML IJ SOLN: 1.0000 mg | Freq: Once | INTRAMUSCULAR | Status: AC

## 2019-07-27 MED ORDER — ORAL CARE MOUTH RINSE: 15.0000 mL | Freq: Once | OROMUCOSAL | Status: AC

## 2019-07-27 MED ORDER — SUGAMMADEX SODIUM 500 MG/5ML IV SOLN
INTRAVENOUS | Status: AC
  Filled 2019-07-27: qty 5

## 2019-07-27 MED ORDER — POVIDONE-IODINE 10 % EX SWAB: 2.0000 "application " | Freq: Once | CUTANEOUS | Status: DC

## 2019-07-27 MED ORDER — FENTANYL CITRATE (PF) 100 MCG/2ML IJ SOLN: 100.0000 ug | Freq: Once | INTRAMUSCULAR | Status: AC

## 2019-07-27 MED ORDER — PHENYLEPHRINE HCL-NACL 10-0.9 MG/250ML-% IV SOLN
INTRAVENOUS | Status: DC | PRN
  Administered 2019-07-27: 50 ug/min via INTRAVENOUS

## 2019-07-27 MED ORDER — OXYCODONE HCL 5 MG/5ML PO SOLN: 5.0000 mg | Freq: Once | ORAL | Status: DC | PRN

## 2019-07-27 MED ORDER — MIDAZOLAM HCL 2 MG/2ML IJ SOLN: 2.0000 mg | Freq: Once | INTRAMUSCULAR | Status: AC

## 2019-07-27 MED ORDER — PHENYLEPHRINE 40 MCG/ML (10ML) SYRINGE FOR IV PUSH (FOR BLOOD PRESSURE SUPPORT)
PREFILLED_SYRINGE | INTRAVENOUS | Status: AC
  Filled 2019-07-27: qty 10

## 2019-07-27 MED ORDER — MIDAZOLAM HCL 2 MG/2ML IJ SOLN
INTRAMUSCULAR | Status: AC
  Filled 2019-07-27: qty 2

## 2019-07-27 MED ORDER — MIDAZOLAM HCL 2 MG/2ML IJ SOLN
INTRAMUSCULAR | Status: AC
  Administered 2019-07-27: 2 mg via INTRAVENOUS
  Filled 2019-07-27: qty 2

## 2019-07-27 MED ORDER — DEXMEDETOMIDINE (PRECEDEX) IN NS 20 MCG/5ML (4 MCG/ML) IV SYRINGE
PREFILLED_SYRINGE | INTRAVENOUS | Status: DC | PRN
  Administered 2019-07-27 (×10): 8 ug via INTRAVENOUS

## 2019-07-27 MED ORDER — EPINEPHRINE PF 1 MG/ML IJ SOLN
INTRAMUSCULAR | Status: AC
  Filled 2019-07-27: qty 2

## 2019-07-27 MED ORDER — SUCCINYLCHOLINE CHLORIDE 200 MG/10ML IV SOSY
PREFILLED_SYRINGE | INTRAVENOUS | Status: AC
  Filled 2019-07-27: qty 10

## 2019-07-27 MED ORDER — POVIDONE-IODINE 7.5 % EX SOLN
Freq: Once | CUTANEOUS | Status: DC
  Filled 2019-07-27: qty 118

## 2019-07-27 MED ORDER — ROCURONIUM BROMIDE 10 MG/ML (PF) SYRINGE
PREFILLED_SYRINGE | INTRAVENOUS | Status: AC
  Filled 2019-07-27: qty 10

## 2019-07-27 MED ORDER — SUCCINYLCHOLINE CHLORIDE 200 MG/10ML IV SOSY
PREFILLED_SYRINGE | INTRAVENOUS | Status: DC | PRN
  Administered 2019-07-27: 160 mg via INTRAVENOUS

## 2019-07-27 MED ORDER — FENTANYL CITRATE (PF) 100 MCG/2ML IJ SOLN: 25.0000 ug | INTRAMUSCULAR | Status: DC | PRN

## 2019-07-27 MED ORDER — OXYCODONE HCL 5 MG PO TABS: 5.0000 mg | ORAL_TABLET | Freq: Once | ORAL | Status: DC | PRN

## 2019-07-27 MED ORDER — VANCOMYCIN HCL 500 MG IV SOLR
INTRAVENOUS | Status: DC | PRN
Start: 2019-07-27 — End: 2019-07-27
  Administered 2019-07-27: 500 mg

## 2019-07-27 MED ORDER — LACTATED RINGERS IV SOLN: INTRAVENOUS | Status: DC | PRN

## 2019-07-27 MED ORDER — LIDOCAINE 2% (20 MG/ML) 5 ML SYRINGE
INTRAMUSCULAR | Status: DC | PRN
  Administered 2019-07-27: 20 mg via INTRAVENOUS

## 2019-07-27 MED ORDER — ONDANSETRON HCL 4 MG/2ML IJ SOLN
INTRAMUSCULAR | Status: DC | PRN
  Administered 2019-07-27: 4 mg via INTRAVENOUS

## 2019-07-27 MED ORDER — DEXAMETHASONE SODIUM PHOSPHATE 10 MG/ML IJ SOLN
INTRAMUSCULAR | Status: DC | PRN
  Administered 2019-07-27: 10 mg via INTRAVENOUS

## 2019-07-27 MED ORDER — FENTANYL CITRATE (PF) 250 MCG/5ML IJ SOLN
INTRAMUSCULAR | Status: AC
  Filled 2019-07-27: qty 5

## 2019-07-27 MED ORDER — SODIUM CHLORIDE 0.9 % IR SOLN: Status: DC | PRN

## 2019-07-27 MED ORDER — DEXAMETHASONE SODIUM PHOSPHATE 10 MG/ML IJ SOLN
INTRAMUSCULAR | Status: AC
  Filled 2019-07-27: qty 1

## 2019-07-27 MED ORDER — PROPOFOL 10 MG/ML IV BOLUS
INTRAVENOUS | Status: AC
  Filled 2019-07-27: qty 20

## 2019-07-27 MED ORDER — VANCOMYCIN HCL 500 MG IV SOLR
INTRAVENOUS | Status: AC
  Filled 2019-07-27: qty 500

## 2019-07-27 MED ORDER — ONDANSETRON HCL 4 MG/2ML IJ SOLN
INTRAMUSCULAR | Status: AC
  Filled 2019-07-27: qty 2

## 2019-07-27 MED ORDER — PROPOFOL 10 MG/ML IV BOLUS
INTRAVENOUS | Status: DC | PRN
  Administered 2019-07-27: 170 mg via INTRAVENOUS

## 2019-07-27 SURGICAL SUPPLY — 41 items
AID PSTN UNV HD RSTRNT DISP (MISCELLANEOUS) ×1
ALCOHOL 70% 16 OZ (MISCELLANEOUS) ×2 IMPLANT
BLADE EXCALIBUR 4.0X13 (MISCELLANEOUS) ×2 IMPLANT
BLADE SURG 11 STRL SS (BLADE) ×2 IMPLANT
COVER WAND RF STERILE (DRAPES) ×2 IMPLANT
DRAPE IMP U-DRAPE 54X76 (DRAPES) ×2 IMPLANT
DRAPE INCISE IOBAN 66X45 STRL (DRAPES) ×2 IMPLANT
DRAPE STERI 35X30 U-POUCH (DRAPES) ×2 IMPLANT
DRAPE U-SHAPE 47X51 STRL (DRAPES) ×4 IMPLANT
DRSG AQUACEL AG ADV 3.5X 4 (GAUZE/BANDAGES/DRESSINGS) ×6 IMPLANT
DURAPREP 26ML APPLICATOR (WOUND CARE) ×2 IMPLANT
DW OUTFLOW CASSETTE/TUBE SET (MISCELLANEOUS) ×2 IMPLANT
ELECT REM PT RETURN 9FT ADLT (ELECTROSURGICAL) ×2
ELECTRODE REM PT RTRN 9FT ADLT (ELECTROSURGICAL) ×1 IMPLANT
GAUZE XEROFORM 1X8 LF (GAUZE/BANDAGES/DRESSINGS) ×2 IMPLANT
GLOVE BIOGEL PI IND STRL 8 (GLOVE) ×1 IMPLANT
GLOVE BIOGEL PI INDICATOR 8 (GLOVE) ×1
GLOVE ECLIPSE 8.0 STRL XLNG CF (GLOVE) ×2 IMPLANT
GOWN STRL REUS W/ TWL LRG LVL3 (GOWN DISPOSABLE) ×3 IMPLANT
GOWN STRL REUS W/TWL LRG LVL3 (GOWN DISPOSABLE) ×6
KIT BASIN OR (CUSTOM PROCEDURE TRAY) ×2 IMPLANT
KIT TURNOVER KIT B (KITS) ×2 IMPLANT
MANIFOLD NEPTUNE II (INSTRUMENTS) ×2 IMPLANT
NDL HYPO 25X1 1.5 SAFETY (NEEDLE) ×1 IMPLANT
NEEDLE HYPO 25X1 1.5 SAFETY (NEEDLE) ×2 IMPLANT
NEEDLE SPNL 18GX3.5 QUINCKE PK (NEEDLE) ×2 IMPLANT
NS IRRIG 1000ML POUR BTL (IV SOLUTION) ×2 IMPLANT
PACK SHOULDER (CUSTOM PROCEDURE TRAY) ×2 IMPLANT
PAD ARMBOARD 7.5X6 YLW CONV (MISCELLANEOUS) ×4 IMPLANT
RESTRAINT HEAD UNIVERSAL NS (MISCELLANEOUS) ×2 IMPLANT
SLING ARM FOAM STRAP LRG (SOFTGOODS) IMPLANT
SLING ARM FOAM STRAP XLG (SOFTGOODS) ×2 IMPLANT
SPONGE LAP 4X18 RFD (DISPOSABLE) ×2 IMPLANT
SUCTION FRAZIER HANDLE 10FR (MISCELLANEOUS)
SUCTION TUBE FRAZIER 10FR DISP (MISCELLANEOUS) IMPLANT
SUT ETHILON 3 0 PS 1 (SUTURE) ×2 IMPLANT
TOWEL GREEN STERILE (TOWEL DISPOSABLE) ×2 IMPLANT
TOWEL GREEN STERILE FF (TOWEL DISPOSABLE) ×2 IMPLANT
TUBING ARTHROSCOPY IRRIG 16FT (MISCELLANEOUS) ×2 IMPLANT
WAND STAR VAC 90 (SURGICAL WAND) ×2 IMPLANT
WATER STERILE IRR 1000ML POUR (IV SOLUTION) ×2 IMPLANT

## 2019-07-27 NOTE — Transfer of Care (Signed)
Immediate Anesthesia Transfer of Care Note  Patient: KYIRA VOLKERT  Procedure(s) Performed: RIGHT SHOUDLER ARTHROSCOPY, TISSUE SAMPLE (Right Shoulder)  Patient Location: PACU  Anesthesia Type:GA combined with regional for post-op pain  Level of Consciousness: drowsy and patient cooperative  Airway & Oxygen Therapy: Patient Spontanous Breathing and Patient connected to face mask oxygen  Post-op Assessment: Report given to RN and Post -op Vital signs reviewed and stable  Post vital signs: Reviewed and stable  Last Vitals:  Vitals Value Taken Time  BP    Temp    Pulse 71 07/27/19 1604  Resp 18 07/27/19 1605  SpO2 92 % 07/27/19 1604  Vitals shown include unvalidated device data.  Last Pain:  Vitals:   07/27/19 1239  TempSrc:   PainSc: 0-No pain      Patients Stated Pain Goal: 10 (11/08/09 1735)  Complications: No complications documented.

## 2019-07-27 NOTE — Anesthesia Preprocedure Evaluation (Signed)
Anesthesia Evaluation    Airway Mallampati: II  TM Distance: >3 FB Neck ROM: Full    Dental no notable dental hx. (+) Teeth Intact   Pulmonary asthma , Current Smoker,    Pulmonary exam normal breath sounds clear to auscultation       Cardiovascular hypertension, Pt. on medications Normal cardiovascular exam Rate:Normal     Neuro/Psych PSYCHIATRIC DISORDERS Anxiety Depression negative neurological ROS     GI/Hepatic negative GI ROS, Neg liver ROS,   Endo/Other  Morbid obesity  Renal/GU negative Renal ROS  negative genitourinary   Musculoskeletal  (+) Arthritis , Osteoarthritis,  Left shoulder pain S/P left shoulder arthroplasty   Abdominal (+) + obese,   Peds  Hematology   Anesthesia Other Findings   Reproductive/Obstetrics                             Anesthesia Physical Anesthesia Plan  ASA: III  Anesthesia Plan: General   Post-op Pain Management:  Regional for Post-op pain   Induction: Intravenous  PONV Risk Score and Plan:   Airway Management Planned: Oral ETT  Additional Equipment:   Intra-op Plan:   Post-operative Plan: Extubation in OR  Informed Consent: I have reviewed the patients History and Physical, chart, labs and discussed the procedure including the risks, benefits and alternatives for the proposed anesthesia with the patient or authorized representative who has indicated his/her understanding and acceptance.     Dental advisory given  Plan Discussed with: CRNA and Surgeon  Anesthesia Plan Comments:         Anesthesia Quick Evaluation

## 2019-07-27 NOTE — Anesthesia Postprocedure Evaluation (Signed)
Anesthesia Post Note  Patient: Stephanie Harper  Procedure(s) Performed: RIGHT SHOUDLER ARTHROSCOPY, TISSUE SAMPLE (Right Shoulder)     Patient location during evaluation: PACU Anesthesia Type: General Level of consciousness: awake and alert Pain management: pain level controlled Vital Signs Assessment: post-procedure vital signs reviewed and stable Respiratory status: spontaneous breathing, nonlabored ventilation, respiratory function stable and patient connected to nasal cannula oxygen Cardiovascular status: blood pressure returned to baseline and stable Postop Assessment: no apparent nausea or vomiting Anesthetic complications: no   No complications documented.  Last Vitals:  Vitals:   07/27/19 1350 07/27/19 1607  BP: (!) 135/93   Pulse: 81   Resp: 17   Temp:  (!) 36.4 C  SpO2: 99%     Last Pain:  Vitals:   07/27/19 1607  TempSrc:   PainSc: 0-No pain                 Karema Tocci DAVID

## 2019-07-27 NOTE — Anesthesia Procedure Notes (Signed)
Procedure Name: Intubation Date/Time: 07/27/2019 2:24 PM Performed by: Orlie Dakin, CRNA Pre-anesthesia Checklist: Patient identified, Emergency Drugs available, Suction available and Patient being monitored Patient Re-evaluated:Patient Re-evaluated prior to induction Oxygen Delivery Method: Circle system utilized Preoxygenation: Pre-oxygenation with 100% oxygen Induction Type: IV induction and Rapid sequence Laryngoscope Size: Miller and 3 Grade View: Grade I Tube type: Oral Tube size: 7.0 mm Number of attempts: 1 Airway Equipment and Method: Stylet Placement Confirmation: positive ETCO2,  ETT inserted through vocal cords under direct vision and breath sounds checked- equal and bilateral Secured at: 22 cm Tube secured with: Tape Comments: 4x4s bite block used.

## 2019-07-27 NOTE — Op Note (Signed)
NAME: ARMANDINA, IMAN MEDICAL RECORD [id number] ACCOUNT [id number] DATE OF BIRTH:10-Jan-1970 FACILITY: [facility] LOCATION: [facility] PHYSICIAN:Sally-Anne Wamble Randel Pigg, MD  OPERATIVE REPORT  DATE OF PROCEDURE:  07/27/2019  PREOPERATIVE DIAGNOSIS:  Painful right shoulder hemiarthroplasty.  POSTOPERATIVE DIAGNOSIS:  Painful right shoulder hemiarthroplasty.  PROCEDURE:  Right shoulder arthroscopy with culture of 5 soft tissue specimens.  SURGEON:  Meredith Pel, MD  ASSISTANT:  Annie Main, PA.  INDICATIONS:  This is a patient who initially sustained a proximal humerus fracture over 15 years ago.  She had a course complicated by infection and eventually went on to hemiarthroplasty done at [facility] done by Dr. Cheryln Manly.  She did well with that until  recent onset of pain several months ago.  Laboratory values are only mildly elevated.  Radiographs unremarkable.  Presents now for further management of painful hemiarthroplasty for evaluation of possible indolent infection versus rotator cuff tearing  versus glenoid wear.  PROCEDURE IN DETAIL:  The patient was brought to the operating room where general anesthetic was induced.  Preoperative IV antibiotics were not administered until cultures were obtained.  The patient was placed in the beach chair position.  This was  difficult due to her increased BMI.  Head was in neutral position.  Right arm, shoulder and hand then prescrubbed with hydrogen peroxide, followed by alcohol and Betadine, which was then allowed to air dry.  Shoulder was then prepped with ChloraPrep  solution and draped in a sterile manner.  Charlie Pitter was used to cover the entire operative field.  Timeout was called.  The patient had excellent passive range of motion of that right shoulder with no instability.  A portal was created 2 cm medial and  inferior to the posterolateral margin of the acromion.  The anterior portal was created under direct visualization.  The synovium did not  appear red, inflamed or in any way infected.  Glenoid wear was present, grade IV diffusely throughout 75% of the  glenoid.  Humeral head was intact.  Rotator cuff demonstrated fraying, but no full thickness tears as could be assessed arthroscopically.  Following this, 5 tissue samples were obtained from both the humeral head and glenoid side.  These specimens were  sent for 14-day growth intubation.  The joint was then thoroughly irrigated with 3 L of irrigating solution.  A solution of injectable saline with vancomycin was then placed into the joint and the portals were then closed using 3-0 Vicryl, 3-0 nylon.   Aquacel dressing was applied.  The patient tolerated the procedure well without immediate complications, transferred to the recovery room in stable condition.  It should be noted that Luke's assistance was required at all times during the case for  retraction, arm and limb positioning.  His assistance was a medical necessity.  VN/NUANCE  D:07/27/2019 T:07/27/2019 JOB:[id number]

## 2019-07-27 NOTE — Anesthesia Procedure Notes (Addendum)
Anesthesia Regional Block: Interscalene brachial plexus block   Pre-Anesthetic Checklist: ,, timeout performed, Correct Patient, Correct Site, Correct Laterality, Correct Procedure, Correct Position, site marked, Risks and benefits discussed,  Surgical consent,  Pre-op evaluation,  At surgeon's request and post-op pain management  Laterality: Right  Prep: chloraprep       Needles:  Injection technique: Single-shot  Needle Type: Stimulator Needle - 40      Needle Gauge: 22     Additional Needles:   Procedures:, nerve stimulator,,,,,,,  Narrative:  Start time: 07/27/2019 1:40 PM End time: 07/27/2019 1:50 PM Injection made incrementally with aspirations every 5 mL.  Performed by: Personally   Additional Notes: 25 cc 0.5% Bupivacaine with 1:200 epi 10 cc 1.3% Exparel

## 2019-07-27 NOTE — Progress Notes (Signed)
1250 pt. C/o chest heaviness.VVS. Notified Dr. Linna Caprice. Dr. Linna Caprice  orderd 12 lead EKG. And versed 1 mg. Head of bed elevated.  After receiving versed pt stating the heaviness is less. States she is feeling better. Eddie Dibbles CRNA receiving report.

## 2019-07-27 NOTE — Brief Op Note (Signed)
   07/27/2019  4:06 PM  PATIENT:  [name]  49 y.o. female  PRE-OPERATIVE DIAGNOSIS:  painful right shoulder hemiarthroplasty  POST-OPERATIVE DIAGNOSIS:  painful right shoulder hemiarthroplasty  PROCEDURE:  Procedure(s): RIGHT SHOUDLER ARTHROSCOPY, TISSUE SAMPLE  SURGEON:  Surgeon(s): Meredith Pel, MD  ASSISTANT: magnant pa  ANESTHESIA:   general  EBL: 5 ml    Total I/O In: 1000 [I.V.:1000] Out: 25 [Blood:25]  BLOOD ADMINISTERED: none  DRAINS: none   LOCAL MEDICATIONS USED: Vancomycin into the joint post specimen retrieval  SPECIMEN: Culture specimens x5 obtained and sent to microbiology for 14-day incubation growth.  COUNTS:  YES  TOURNIQUET:  * No tourniquets in log *  DICTATION: .Other Dictation: Dictation Number [id number]  PLAN OF CARE: Discharge to home after PACU  PATIENT DISPOSITION:  PACU - hemodynamically stable

## 2019-07-28 ENCOUNTER — Encounter (HOSPITAL_COMMUNITY): Payer: Self-pay | Admitting: Orthopedic Surgery

## 2019-07-31 ENCOUNTER — Other Ambulatory Visit: Payer: Self-pay | Admitting: Internal Medicine

## 2019-08-01 ENCOUNTER — Encounter: Payer: Self-pay | Admitting: Orthopedic Surgery

## 2019-08-02 ENCOUNTER — Telehealth: Payer: Self-pay

## 2019-08-02 NOTE — Telephone Encounter (Signed)
Patient called in wanting to ask questions about the tape blistering her and wants to know if she can take it off. Bandage I sweet  No odor .

## 2019-08-02 NOTE — Telephone Encounter (Signed)
Thanks

## 2019-08-02 NOTE — Telephone Encounter (Signed)
IC s/w patient advised that was ok to remove bandage and to keep it clean and dry.

## 2019-08-04 ENCOUNTER — Ambulatory Visit (INDEPENDENT_AMBULATORY_CARE_PROVIDER_SITE_OTHER): Payer: BC Managed Care – PPO | Admitting: Orthopedic Surgery

## 2019-08-04 DIAGNOSIS — G8929 Other chronic pain: Secondary | ICD-10-CM

## 2019-08-04 DIAGNOSIS — M25511 Pain in right shoulder: Secondary | ICD-10-CM

## 2019-08-05 DIAGNOSIS — L921 Necrobiosis lipoidica, not elsewhere classified: Secondary | ICD-10-CM | POA: Diagnosis not present

## 2019-08-05 DIAGNOSIS — D2361 Other benign neoplasm of skin of right upper limb, including shoulder: Secondary | ICD-10-CM | POA: Diagnosis not present

## 2019-08-05 DIAGNOSIS — L239 Allergic contact dermatitis, unspecified cause: Secondary | ICD-10-CM | POA: Diagnosis not present

## 2019-08-06 ENCOUNTER — Encounter: Payer: Self-pay | Admitting: Orthopedic Surgery

## 2019-08-06 NOTE — Progress Notes (Signed)
Post-Op Visit Note   Patient: Stephanie Harper           Date of Birth: 11-16-1970           MRN: [id number] Visit Date: 08/04/2019 PCP: Cassandria Anger, MD   Assessment & Plan:  Chief Complaint:  Chief Complaint  Patient presents with  . Right Shoulder - Routine Post Op   Visit Diagnoses:  1. Chronic right shoulder pain     Plan: [name] is a patient is now a week out right shoulder arthroscopy with tissue samples..  She has been doing reasonably well.  Incision intact but she did have a reaction to the adhesive.  All cultures negative to date.  4-week return.  I think the pain in the shoulder is most likely coming from progressive glenoid arthritis.  We will see her back in 4 weeks for final check.  I do not think patties pain symptoms in the arm rise to the level of requiring intervention at this time.  Follow-Up Instructions: No follow-ups on file.   Orders:  No orders of the defined types were placed in this encounter.  No orders of the defined types were placed in this encounter.   Imaging: No results found.  PMFS History: Patient Active Problem List   Diagnosis Date Noted  . Hemorrhoids 01/25/2019  . Leukocytosis 09/07/2018  . Palpitations 01/19/2018  . Heat stroke 07/22/2016  . Vitamin D deficiency 07/22/2014  . Well adult exam 04/26/2014  . Rash and nonspecific skin eruption 01/25/2014  . MDD (major depressive disorder) 06/17/2013  . GAD (generalized anxiety disorder) 06/17/2013  . Orthostatic lightheadedness 06/09/2012  . Venous insufficiency of leg 03/04/2012  . Lapband APS Jan 2014 01/21/2012  . Arthralgia 10/04/2011  . Night sweats 06/05/2010  . ANAL OR RECTAL PAIN 01/05/2010  . HEMATOCHEZIA 01/05/2010  . ELBOW PAIN 10/31/2009  . Acute upper respiratory infection 12/06/2008  . SHOULDER PAIN 12/06/2008  . CONTACT DERMATITIS 06/03/2008  . OBESITY, MORBID 05/04/2008  . TOBACCO USE DISORDER/SMOKER-SMOKING CESSATION DISCUSSED 07/28/2007  .  Chronic anxiety 12/26/2006  . PANIC ATTACK 12/26/2006  . Essential hypertension 12/26/2006  . Asthma 12/26/2006  . Depression 08/28/2006  . LOW BACK PAIN 08/28/2006   Past Medical History:  Diagnosis Date  . Anal fissure 2012   Dr Ardis Hughs  . Anxiety   . Arthritis   . Asthma    COLD WEATHER RELATED, bronchitis  . Baker's cyst of knee    Right knee  . Complication of anesthesia    PANIC ATTACK RIGHT BEFORE SHOULDER REPLACMENT SURGERY AT [facility]; PT STATES CLAUSTROPHOBIA AND DOES NOT WANT TO BE AWARE OF BEING STRAPPED DOWN IN OR  . Depression   . HTN (hypertension)    after having lap band surgery she was taken off medications  . LBP (low back pain)   . Obesity   . Osteomyelitis ([place])    at [facility]  . Pain    HX OF BILATERAL SHOULDER SURGERIES-CHRONIC SHOULDER PAIN ;  HX OF BILATERAL KNEE PAIN  . Sepsis(995.91) 2005   From shoulder replacement surgery  . Shoulder fracture, left     Family History  Problem Relation Age of Onset  . Arthritis Mother   . Diabetes Mother   . Hyperlipidemia Mother   . Hypertension Mother   . Arthritis Father   . Diabetes Father   . Hyperlipidemia Father   . Hypertension Father   . Heart disease Father   . Coronary artery disease Other   .  Hyperlipidemia Other   . Hypertension Other   . Bipolar disorder Brother   . Anxiety disorder Neg Hx   . Depression Neg Hx   . Dementia Neg Hx   . Alcohol abuse Neg Hx   . Drug abuse Neg Hx   . Schizophrenia Neg Hx     Past Surgical History:  Procedure Laterality Date  . APPENDECTOMY    . BREATH TEK H PYLORI  07/15/2011   Procedure: BREATH TEK H PYLORI;  Surgeon: Pedro Earls, MD;  Location: [facility];  Service: General;  Laterality: N/A;  . CHOLECYSTECTOMY    . LAPAROSCOPIC GASTRIC BANDING  01/21/2012   Procedure: LAPAROSCOPIC GASTRIC BANDING;  Surgeon: Pedro Earls, MD;  Location: [facility];  Service: General;  Laterality: N/A;  . LASER ABLATION    . MESH APPLIED TO LAP PORT  01/21/2012    Procedure: MESH APPLIED TO LAP PORT;  Surgeon: Pedro Earls, MD;  Location: [facility];  Service: General;;  . PLANTAR FASCIA RELEASE  1996  . SHOULDER ARTHROSCOPY Right 07/27/2019   Procedure: RIGHT SHOUDLER ARTHROSCOPY, TISSUE SAMPLE;  Surgeon: Meredith Pel, MD;  Location: [facility];  Service: Orthopedics;  Laterality: Right;  . TONSILLECTOMY    . TOTAL SHOULDER REPLACEMENT     Right  . TUBAL LIGATION     UTERINE ABLATION WAS DONE AT THE TIME OF TUBAL LIGATION   Social History   Occupational History  . Occupation: Arboriculturist: UNEMPLOYED  Tobacco Use  . Smoking status: Current Every Day Smoker    Packs/day: 1.00    Years: 31.00    Pack years: 31.00    Types: Cigarettes    Start date: 08/12/1980  . Smokeless tobacco: Never Used  . Tobacco comment: WAS SMOKING 2 PPD-HAS CUT BACK TO 1 PPD OR LESS  Vaping Use  . Vaping Use: Never used  Substance and Sexual Activity  . Alcohol use: No  . Drug use: No  . Sexual activity: Yes

## 2019-08-08 NOTE — H&P (Signed)
Stephanie Harper is a 49 year old patient with right shoulder pain.  She had proximal humerus fracture about 15 years ago which did become infected and was treated with I&D.  She subsequently went on to clear that infection and underwent humeral hemiarthroplasty by Dr. Webb Laws mania at [facility] about 10 years ago.  She is done very well with the right shoulder replacement until recently when she has developed unexplained pain.  She has had some mild increase in infection markers.  Radiographs and CT scan unremarkable.  Attempted aspiration performed with no fluid obtained.  She presents now for operative management and tissue biopsy for right shoulder hemiarthroplasty to rule out occult infection as a source of her pain.  All systems reviewed negative as they relate to the right shoulder  Impression is right shoulder pain unclear etiology.  Could be occult infection versus early rotator cuff pathology versus glenoid wear.  Arthroscopy is indicated at this time because all other methods to rule out infection have been tried.  She has had a relatively acute onset of pain.  Patient understands the risk and benefits of the surgical procedure.  All questions answered    Constitutional: Patient appears well-developed HEENT:  Head: Normocephalic Eyes:EOM are normal Neck: Normal range of motion Cardiovascular: Normal rate Pulmonary/chest: Effort normal Neurologic: Patient is alert Skin: Skin is warm Psychiatric: Patient has normal mood and affect     Ortho exam demonstrates well-healed surgical incision with no warmth and no lymphadenopathy in the right shoulder region.  Patient has forward flexion abduction both above 90 degrees.  Rotator cuff strength is actually pretty reasonable to infraspinatus supraspinatus and subscap muscle testing.  No instability anteriorly or posteriorly.

## 2019-08-12 NOTE — Progress Notes (Signed)
Please call patient.  All cultures have remained negative for infection which is a good thing.

## 2019-08-19 LAB — AEROBIC/ANAEROBIC CULTURE W GRAM STAIN (SURGICAL/DEEP WOUND)
Culture: NO GROWTH
Culture: NO GROWTH
Culture: NO GROWTH
Culture: NO GROWTH
Culture: NO GROWTH
Gram Stain: NONE SEEN
Gram Stain: NONE SEEN
Gram Stain: NONE SEEN
Gram Stain: NONE SEEN
Gram Stain: NONE SEEN

## 2019-08-23 ENCOUNTER — Other Ambulatory Visit: Payer: Self-pay | Admitting: Internal Medicine

## 2019-09-06 ENCOUNTER — Ambulatory Visit: Payer: Medicare Other | Admitting: Orthopedic Surgery

## 2019-10-26 ENCOUNTER — Ambulatory Visit: Payer: Medicare Other | Admitting: Internal Medicine

## 2019-10-29 ENCOUNTER — Ambulatory Visit (INDEPENDENT_AMBULATORY_CARE_PROVIDER_SITE_OTHER): Payer: BC Managed Care – PPO | Admitting: Internal Medicine

## 2019-10-29 ENCOUNTER — Other Ambulatory Visit: Payer: Self-pay

## 2019-10-29 ENCOUNTER — Encounter: Payer: Self-pay | Admitting: Internal Medicine

## 2019-10-29 VITALS — BP 148/90 | HR 89 | Temp 98.4°F | Ht 67.0 in | Wt >= 6400 oz

## 2019-10-29 DIAGNOSIS — I10 Essential (primary) hypertension: Secondary | ICD-10-CM | POA: Diagnosis not present

## 2019-10-29 DIAGNOSIS — Z23 Encounter for immunization: Secondary | ICD-10-CM

## 2019-10-29 DIAGNOSIS — J4520 Mild intermittent asthma, uncomplicated: Secondary | ICD-10-CM

## 2019-10-29 DIAGNOSIS — E559 Vitamin D deficiency, unspecified: Secondary | ICD-10-CM | POA: Diagnosis not present

## 2019-10-29 LAB — CBC WITH DIFFERENTIAL/PLATELET
Basophils Absolute: 0.1 10*3/uL (ref 0.0–0.1)
Basophils Relative: 0.4 % (ref 0.0–3.0)
Eosinophils Absolute: 0.1 10*3/uL (ref 0.0–0.7)
Eosinophils Relative: 0.7 % (ref 0.0–5.0)
HCT: 42.4 % (ref 36.0–46.0)
Hemoglobin: 14.1 g/dL (ref 12.0–15.0)
Lymphocytes Relative: 18.9 % (ref 12.0–46.0)
Lymphs Abs: 2.4 10*3/uL (ref 0.7–4.0)
MCHC: 33.2 g/dL (ref 30.0–36.0)
MCV: 89.1 fl (ref 78.0–100.0)
Monocytes Absolute: 0.9 10*3/uL (ref 0.1–1.0)
Monocytes Relative: 7.1 % (ref 3.0–12.0)
Neutro Abs: 9.3 10*3/uL — ABNORMAL HIGH (ref 1.4–7.7)
Neutrophils Relative %: 72.9 % (ref 43.0–77.0)
Platelets: 309 10*3/uL (ref 150.0–400.0)
RBC: 4.76 Mil/uL (ref 3.87–5.11)
RDW: 14.4 % (ref 11.5–15.5)
WBC: 12.8 10*3/uL — ABNORMAL HIGH (ref 4.0–10.5)

## 2019-10-29 LAB — COMPREHENSIVE METABOLIC PANEL
ALT: 22 U/L (ref 0–35)
AST: 19 U/L (ref 0–37)
Albumin: 3.8 g/dL (ref 3.5–5.2)
Alkaline Phosphatase: 84 U/L (ref 39–117)
BUN: 12 mg/dL (ref 6–23)
CO2: 32 mEq/L (ref 19–32)
Calcium: 9.5 mg/dL (ref 8.4–10.5)
Chloride: 102 mEq/L (ref 96–112)
Creatinine, Ser: 0.59 mg/dL (ref 0.40–1.20)
GFR: 106.42 mL/min (ref >60.00)
Glucose, Bld: 96 mg/dL (ref 70–99)
Potassium: 4 mEq/L (ref 3.5–5.1)
Sodium: 140 mEq/L (ref 135–145)
Total Bilirubin: 0.4 mg/dL (ref 0.2–1.2)
Total Protein: 6.9 g/dL (ref 6.0–8.3)

## 2019-10-29 MED ORDER — HYDROCODONE-ACETAMINOPHEN 5-325 MG PO TABS: 1.0000 | ORAL_TABLET | Freq: Four times a day (QID) | ORAL | 0 refills | Status: DC | PRN

## 2019-10-29 MED ORDER — ALPRAZOLAM 1 MG PO TABS: ORAL_TABLET | ORAL | 2 refills | Status: DC

## 2019-10-29 NOTE — Assessment & Plan Note (Signed)
Vit D 

## 2019-10-29 NOTE — Assessment & Plan Note (Signed)
Furosemide.

## 2019-10-29 NOTE — Assessment & Plan Note (Signed)
Breo

## 2019-10-29 NOTE — Progress Notes (Signed)
Subjective:  Patient ID: Stephanie Harper, female    DOB: 1970/12/18  Age: 49 y.o. MRN: [id number]  CC: Follow-up   HPI Stephanie Harper presents for chronic pain, anxiety, B12 def f/u Stephanie Harper was encouraged to get a JJ Covid inj     Outpatient Medications Prior to Visit  Medication Sig Dispense Refill  . albuterol (VENTOLIN HFA) 108 (90 Base) MCG/ACT inhaler TAKE 2 PUFFS BY MOUTH EVERY 6 HOURS AS NEEDED (Patient taking differently: Inhale 2 puffs into the lungs every 6 (six) hours as needed for wheezing or shortness of breath. ) 8 g 2  . ALPRAZolam (XANAX) 1 MG tablet TAKE 1 TABLET 3 TIMES A DAY AS NEEDED FOR ANXIETY OR SLEEP (Patient taking differently: Take 1 mg by mouth 3 (three) times daily as needed for anxiety or sleep. ) 90 tablet 2  . BREO ELLIPTA 100-25 MCG/INH AEPB INHALE 1 PUFF BY MOUTH EVERY DAY 180 each 3  . buPROPion (WELLBUTRIN SR) 100 MG 12 hr tablet TAKE 1 TABLET BY MOUTH TWICE A DAY (Patient taking differently: Take 100 mg by mouth 2 (two) times daily. ) 180 tablet 1  . Cholecalciferol 1000 UNITS tablet Take 2 tablets (2,000 Units total) by mouth daily. 100 tablet 3  . escitalopram (LEXAPRO) 20 MG tablet TAKE 1 TABLET BY MOUTH EVERY DAY (Patient taking differently: Take 20 mg by mouth daily. ) 90 tablet 1  . furosemide (LASIX) 40 MG tablet Take 1 tablet (40 mg total) by mouth daily as needed. (Patient taking differently: Take 40 mg by mouth daily as needed for fluid. ) 90 tablet 3  . ibuprofen (ADVIL) 600 MG tablet TAKE 1 TABLET (600 MG TOTAL) BY MOUTH 2 (TWO) TIMES DAILY AS NEEDED. 180 tablet 0  . Melatonin 10 MG CAPS Take 10 mg by mouth at bedtime as needed (sleep).    . Multiple Vitamin (MULTIVITAMIN) tablet Take 1 tablet by mouth 2 (two) times daily.     . niacinamide 500 MG tablet Take 500 mg by mouth 2 (two) times daily.     . pentoxifylline (TRENTAL) 400 MG CR tablet Take 400 mg by mouth daily.     . vitamin B-12 (CYANOCOBALAMIN) 1000 MCG tablet Take 1,000 mcg  by mouth daily.     . diphenhydrAMINE HCl (ZZZQUIL) 50 MG/30ML LIQD Take 50 mg by mouth at bedtime as needed (sleep).    . methocarbamol (ROBAXIN) 500 MG tablet Take 1 tablet (500 mg total) by mouth every 8 (eight) hours as needed. 30 tablet 0  . oxyCODONE (ROXICODONE) 5 MG immediate release tablet Take 1 tablet (5 mg total) by mouth every 8 (eight) hours as needed. 30 tablet 0   No facility-administered medications prior to visit.    ROS: Review of Systems  Constitutional: Negative for activity change, appetite change, chills, fatigue and unexpected weight change.  HENT: Negative for congestion, mouth sores and sinus pressure.   Eyes: Negative for visual disturbance.  Respiratory: Negative for cough and chest tightness.   Gastrointestinal: Negative for abdominal pain and nausea.  Genitourinary: Negative for difficulty urinating, frequency and vaginal pain.  Musculoskeletal: Positive for arthralgias and back pain. Negative for gait problem.  Skin: Negative for pallor and rash.  Neurological: Negative for dizziness, tremors, weakness, numbness and headaches.  Psychiatric/Behavioral: Negative for confusion, sleep disturbance and suicidal ideas.    Objective:  BP (!) 148/90 (BP Location: Left Arm, Patient Position: Sitting, Cuff Size: Large)   Pulse 89   Temp 98.4 F (  36.9 C) (Oral)   Ht 5' 7"  (1.702 m)   Wt (!) 400 lb 3.2 oz (181.5 kg)   SpO2 97%   BMI 62.68 kg/m   BP Readings from Last 3 Encounters:  10/29/19 (!) 148/90  07/27/19 140/85  07/20/19 (!) 160/110    Wt Readings from Last 3 Encounters:  10/29/19 (!) 400 lb 3.2 oz (181.5 kg)  07/27/19 (!) 401 lb (181.9 kg)  07/20/19 (!) 401 lb (181.9 kg)    Physical Exam Constitutional:      General: She is not in acute distress.    Appearance: She is well-developed. She is obese.  HENT:     Head: Normocephalic.     Right Ear: External ear normal.     Left Ear: External ear normal.     Nose: Nose normal.  Eyes:      General:        Right eye: No discharge.        Left eye: No discharge.     Conjunctiva/sclera: Conjunctivae normal.     Pupils: Pupils are equal, round, and reactive to light.  Neck:     Thyroid: No thyromegaly.     Vascular: No JVD.     Trachea: No tracheal deviation.  Cardiovascular:     Rate and Rhythm: Normal rate and regular rhythm.     Heart sounds: Normal heart sounds.  Pulmonary:     Effort: No respiratory distress.     Breath sounds: No stridor. No wheezing.  Abdominal:     General: Bowel sounds are normal. There is no distension.     Palpations: Abdomen is soft. There is no mass.     Tenderness: There is no abdominal tenderness. There is no guarding or rebound.  Musculoskeletal:        General: Tenderness present.     Cervical back: Normal range of motion and neck supple.  Lymphadenopathy:     Cervical: No cervical adenopathy.  Skin:    Findings: No erythema or rash.  Neurological:     Mental Status: She is oriented to person, place, and time.     Cranial Nerves: No cranial nerve deficit.     Motor: No abnormal muscle tone.     Coordination: Coordination normal.     Deep Tendon Reflexes: Reflexes normal.  Psychiatric:        Behavior: Behavior normal.        Thought Content: Thought content normal.        Judgment: Judgment normal.     Lab Results  Component Value Date   WBC 10.4 07/27/2019   HGB 15.4 (H) 07/27/2019   HCT 48.5 (H) 07/27/2019   PLT 334 07/27/2019   GLUCOSE 104 (H) 07/27/2019   CHOL 168 08/03/2018   TRIG 134.0 08/03/2018   HDL 45.00 08/03/2018   LDLCALC 96 08/03/2018   ALT 16 03/08/2019   AST 17 03/08/2019   NA 139 07/27/2019   K 3.8 07/27/2019   CL 101 07/27/2019   CREATININE 0.65 07/27/2019   BUN 6 07/27/2019   CO2 28 07/27/2019   TSH 3.59 03/08/2019   INR 1.0 08/09/2007   HGBA1C 5.7 01/19/2016    No results found.  Assessment & Plan:      Walker Kehr, MD

## 2019-11-14 ENCOUNTER — Other Ambulatory Visit: Payer: Self-pay | Admitting: Internal Medicine

## 2019-11-19 ENCOUNTER — Other Ambulatory Visit: Payer: Self-pay | Admitting: Internal Medicine

## 2020-01-12 ENCOUNTER — Other Ambulatory Visit: Payer: Self-pay | Admitting: Internal Medicine

## 2020-01-31 ENCOUNTER — Telehealth (INDEPENDENT_AMBULATORY_CARE_PROVIDER_SITE_OTHER): Payer: BC Managed Care – PPO | Admitting: Internal Medicine

## 2020-01-31 ENCOUNTER — Encounter: Payer: Self-pay | Admitting: Internal Medicine

## 2020-01-31 DIAGNOSIS — M5440 Lumbago with sciatica, unspecified side: Secondary | ICD-10-CM | POA: Diagnosis not present

## 2020-01-31 DIAGNOSIS — F419 Anxiety disorder, unspecified: Secondary | ICD-10-CM

## 2020-01-31 DIAGNOSIS — G8929 Other chronic pain: Secondary | ICD-10-CM | POA: Diagnosis not present

## 2020-01-31 DIAGNOSIS — I10 Essential (primary) hypertension: Secondary | ICD-10-CM | POA: Diagnosis not present

## 2020-01-31 DIAGNOSIS — M2550 Pain in unspecified joint: Secondary | ICD-10-CM | POA: Diagnosis not present

## 2020-01-31 MED ORDER — HYDROCODONE-ACETAMINOPHEN 5-325 MG PO TABS: 1.0000 | ORAL_TABLET | Freq: Four times a day (QID) | ORAL | 0 refills | Status: DC | PRN

## 2020-01-31 MED ORDER — ALPRAZOLAM 1 MG PO TABS: ORAL_TABLET | ORAL | 2 refills | Status: DC

## 2020-01-31 NOTE — Progress Notes (Signed)
Virtual Visit via Video Note  I connected with Stephanie Harper on 01/31/20 at  2:00 PM EST by a video enabled telemedicine application and verified that I am speaking with the correct person using two identifiers.   I discussed the limitations of evaluation and management by telemedicine and the availability of in person appointments. The patient expressed understanding and agreed to proceed.  I was located at our [facility] office. The patient was at home. There was no one else present in the visit.   History of Present Illness: We need to follow-up on LBP, shoulder pain, anxiety f/u  There has been no runny nose, cough, chest pain, shortness of breath, abdominal pain, diarrhea, constipation, arthralgias, skin rashes.   Observations/Objective: The patient appears to be in no acute distress  Assessment and Plan:  See my Assessment and Plan. Follow Up Instructions:    I discussed the assessment and treatment plan with the patient. The patient was provided an opportunity to ask questions and all were answered. The patient agreed with the plan and demonstrated an understanding of the instructions.   The patient was advised to call back or seek an in-person evaluation if the symptoms worsen or if the condition fails to improve as anticipated.  I provided face-to-face time during this encounter. We were at different locations.   Walker Kehr, MD

## 2020-01-31 NOTE — Assessment & Plan Note (Signed)
On Furosemide

## 2020-01-31 NOTE — Assessment & Plan Note (Signed)
Cont w/Norco  Potential benefits of a long term opioids use as well as potential risks (i.e. addiction risk, apnea etc) and complications (i.e. Somnolence, constipation and others) were explained to the patient and were aknowledged.

## 2020-01-31 NOTE — Assessment & Plan Note (Signed)
Cont w/Xanax prn  Potential benefits of a long term benzodiazepines  use as well as potential risks  and complications were explained to the patient and were aknowledged.

## 2020-01-31 NOTE — Assessment & Plan Note (Signed)
Shoulder pain - no change No fever

## 2020-02-08 ENCOUNTER — Telehealth: Payer: Self-pay | Admitting: Internal Medicine

## 2020-02-08 NOTE — Telephone Encounter (Signed)
Patient dropped off a renewal parking placard. Requesting it to be mailed back to her once completed.   Form has been completed and Placed in providers box to review and sign.

## 2020-02-10 NOTE — Telephone Encounter (Signed)
Form has been signed, Copy sent to scan.  Original mailed to patient as request.

## 2020-02-22 ENCOUNTER — Other Ambulatory Visit: Payer: Self-pay

## 2020-02-22 ENCOUNTER — Ambulatory Visit (INDEPENDENT_AMBULATORY_CARE_PROVIDER_SITE_OTHER): Payer: BC Managed Care – PPO

## 2020-02-22 VITALS — BP 120/80 | HR 91 | Temp 98.4°F | Ht 67.0 in | Wt 396.8 lb

## 2020-02-22 DIAGNOSIS — Z0000 Encounter for general adult medical examination without abnormal findings: Secondary | ICD-10-CM

## 2020-02-22 NOTE — Patient Instructions (Signed)
Stephanie Harper , Thank you for taking time to come for your Medicare Wellness Visit. I appreciate your ongoing commitment to your health goals. Please review the following plan we discussed and let me know if I can assist you in the future.   Screening recommendations/referrals: Colonoscopy: last done 03/22/2010; due every 10 years Mammogram: 11/24/2018 Bone Density: never done Recommended yearly ophthalmology/optometry visit for glaucoma screening and checkup Recommended yearly dental visit for hygiene and checkup  Vaccinations: Influenza vaccine: 10/29/2019 Pneumococcal vaccine: never done Tdap vaccine: 11/01/2011; due every 10 years Shingles vaccine: never done  Covid-19: never done  Advanced directives: Please bring a copy of your health care power of attorney and living will to the office at your convenience.  Conditions/risks identified: Yes; Reviewed health maintenance screenings with patient today and relevant education, vaccines, and/or referrals were provided. Please continue to do your personal lifestyle choices by: daily care of teeth and gums, regular physical activity (goal should be 5 days a week for 30 minutes), eat a healthy diet, avoid tobacco and drug use, limiting any alcohol intake, taking a low-dose aspirin (if not allergic or have been advised by your provider otherwise) and taking vitamins and minerals as recommended by your provider. Continue doing brain stimulating activities (puzzles, reading, adult coloring books, staying active) to keep memory sharp. Continue to eat heart healthy diet (full of fruits, vegetables, whole grains, lean protein, water--limit salt, fat, and sugar intake) and increase physical activity as tolerated.  Next appointment: Please schedule your next Medicare Wellness Visit with your Nurse Health Advisor in 1 year by calling [phone].  Preventive Care 40-64 Years, Female Preventive care refers to lifestyle choices and visits with your health  care provider that can promote health and wellness. What does preventive care include?  A yearly physical exam. This is also called an annual well check.  Dental exams once or twice a year.  Routine eye exams. Ask your health care provider how often you should have your eyes checked.  Personal lifestyle choices, including:  Daily care of your teeth and gums.  Regular physical activity.  Eating a healthy diet.  Avoiding tobacco and drug use.  Limiting alcohol use.  Practicing safe sex.  Taking low-dose aspirin daily starting at age 29.  Taking vitamin and mineral supplements as recommended by your health care provider. What happens during an annual well check? The services and screenings done by your health care provider during your annual well check will depend on your age, overall health, lifestyle risk factors, and family history of disease. Counseling  Your health care provider may ask you questions about your:  Alcohol use.  Tobacco use.  Drug use.  Emotional well-being.  Home and relationship well-being.  Sexual activity.  Eating habits.  Work and work Statistician.  Method of birth control.  Menstrual cycle.  Pregnancy history. Screening  You may have the following tests or measurements:  Height, weight, and BMI.  Blood pressure.  Lipid and cholesterol levels. These may be checked every 5 years, or more frequently if you are over 48 years old.  Skin check.  Lung cancer screening. You may have this screening every year starting at age 12 if you have a 30-pack-year history of smoking and currently smoke or have quit within the past 15 years.  Fecal occult blood test (FOBT) of the stool. You may have this test every year starting at age 31.  Flexible sigmoidoscopy or colonoscopy. You may have a sigmoidoscopy every 5 years or a  colonoscopy every 10 years starting at age 96.  Hepatitis C blood test.  Hepatitis B blood test.  Sexually  transmitted disease (STD) testing.  Diabetes screening. This is done by checking your blood sugar (glucose) after you have not eaten for a while (fasting). You may have this done every 1-3 years.  Mammogram. This may be done every 1-2 years. Talk to your health care provider about when you should start having regular mammograms. This may depend on whether you have a family history of breast cancer.  BRCA-related cancer screening. This may be done if you have a family history of breast, ovarian, tubal, or peritoneal cancers.  Pelvic exam and Pap test. This may be done every 3 years starting at age 50. Starting at age 21, this may be done every 5 years if you have a Pap test in combination with an HPV test.  Bone density scan. This is done to screen for osteoporosis. You may have this scan if you are at high risk for osteoporosis. Discuss your test results, treatment options, and if necessary, the need for more tests with your health care provider. Vaccines  Your health care provider may recommend certain vaccines, such as:  Influenza vaccine. This is recommended every year.  Tetanus, diphtheria, and acellular pertussis (Tdap, Td) vaccine. You may need a Td booster every 10 years.  Zoster vaccine. You may need this after age 55.  Pneumococcal 13-valent conjugate (PCV13) vaccine. You may need this if you have certain conditions and were not previously vaccinated.  Pneumococcal polysaccharide (PPSV23) vaccine. You may need one or two doses if you smoke cigarettes or if you have certain conditions. Talk to your health care provider about which screenings and vaccines you need and how often you need them. This information is not intended to replace advice given to you by your health care provider. Make sure you discuss any questions you have with your health care provider. Document Released: 01/20/2015 Document Revised: 09/13/2015 Document Reviewed: 10/25/2014 Elsevier Interactive Patient  Education  2017 [place] Prevention in the Home Falls can cause injuries. They can happen to people of all ages. There are many things you can do to make your home safe and to help prevent falls. What can I do on the outside of my home?  Regularly fix the edges of walkways and driveways and fix any cracks.  Remove anything that might make you trip as you walk through a door, such as a raised step or threshold.  Trim any bushes or trees on the path to your home.  Use bright outdoor lighting.  Clear any walking paths of anything that might make someone trip, such as rocks or tools.  Regularly check to see if handrails are loose or broken. Make sure that both sides of any steps have handrails.  Any raised decks and porches should have guardrails on the edges.  Have any leaves, snow, or ice cleared regularly.  Use sand or salt on walking paths during winter.  Clean up any spills in your garage right away. This includes oil or grease spills. What can I do in the bathroom?  Use night lights.  Install grab bars by the toilet and in the tub and shower. Do not use towel bars as grab bars.  Use non-skid mats or decals in the tub or shower.  If you need to sit down in the shower, use a plastic, non-slip stool.  Keep the floor dry. Clean up any water  that spills on the floor as soon as it happens.  Remove soap buildup in the tub or shower regularly.  Attach bath mats securely with double-sided non-slip rug tape.  Do not have throw rugs and other things on the floor that can make you trip. What can I do in the bedroom?  Use night lights.  Make sure that you have a light by your bed that is easy to reach.  Do not use any sheets or blankets that are too big for your bed. They should not hang down onto the floor.  Have a firm chair that has side arms. You can use this for support while you get dressed.  Do not have throw rugs and other things on the floor that can  make you trip. What can I do in the kitchen?  Clean up any spills right away.  Avoid walking on wet floors.  Keep items that you use a lot in easy-to-reach places.  If you need to reach something above you, use a strong step stool that has a grab bar.  Keep electrical cords out of the way.  Do not use floor polish or wax that makes floors slippery. If you must use wax, use non-skid floor wax.  Do not have throw rugs and other things on the floor that can make you trip. What can I do with my stairs?  Do not leave any items on the stairs.  Make sure that there are handrails on both sides of the stairs and use them. Fix handrails that are broken or loose. Make sure that handrails are as long as the stairways.  Check any carpeting to make sure that it is firmly attached to the stairs. Fix any carpet that is loose or worn.  Avoid having throw rugs at the top or bottom of the stairs. If you do have throw rugs, attach them to the floor with carpet tape.  Make sure that you have a light switch at the top of the stairs and the bottom of the stairs. If you do not have them, ask someone to add them for you. What else can I do to help prevent falls?  Wear shoes that:  Do not have high heels.  Have rubber bottoms.  Are comfortable and fit you well.  Are closed at the toe. Do not wear sandals.  If you use a stepladder:  Make sure that it is fully opened. Do not climb a closed stepladder.  Make sure that both sides of the stepladder are locked into place.  Ask someone to hold it for you, if possible.  Clearly mark and make sure that you can see:  Any grab bars or handrails.  First and last steps.  Where the edge of each step is.  Use tools that help you move around (mobility aids) if they are needed. These include:  Canes.  Walkers.  Scooters.  Crutches.  Turn on the lights when you go into a dark area. Replace any light bulbs as soon as they burn out.  Set up your  furniture so you have a clear path. Avoid moving your furniture around.  If any of your floors are uneven, fix them.  If there are any pets around you, be aware of where they are.  Review your medicines with your doctor. Some medicines can make you feel dizzy. This can increase your chance of falling. Ask your doctor what other things that you can do to help prevent falls. This information is  not intended to replace advice given to you by your health care provider. Make sure you discuss any questions you have with your health care provider. Document Released: 10/20/2008 Document Revised: 06/01/2015 Document Reviewed: 01/28/2014 Elsevier Interactive Patient Education  2017 [employer].

## 2020-02-22 NOTE — Progress Notes (Addendum)
Subjective:   Stephanie Harper is a 50 y.o. female who presents for Medicare Annual (Subsequent) preventive examination.  Review of Systems    No ROS. Medicare Wellness Visit. Additional risk factors are reflected in social history. Cardiac Risk Factors include: family history of premature cardiovascular disease;hypertension;obesity (BMI >30kg/m2);smoking/ tobacco exposure     Objective:    Today's Vitals   02/22/20 1442  BP: 120/80  Pulse: 91  Temp: 98.4 F (36.9 C)  SpO2: 97%  Weight: (!) 396 lb 12.8 oz (180 kg)  Height: 5' 7"  (1.702 m)  PainSc: 7    Body mass index is 62.15 kg/m.  Advanced Directives 02/22/2020 07/27/2019 08/20/2016 06/14/2016 01/10/2012  Does Patient Have a Medical Advance Directive? No No No No Patient does not have advance directive;Patient would not like information  Does patient want to make changes to medical advance directive? - No - Patient declined - - -  Would patient like information on creating a medical advance directive? No - Patient declined - No - Patient declined Yes (ED - Information included in AVS) -  Some encounter information is confidential and restricted. Go to Review Flowsheets activity to see all data.    Current Medications (verified) Outpatient Encounter Medications as of 02/22/2020  Medication Sig   albuterol (VENTOLIN HFA) 108 (90 Base) MCG/ACT inhaler TAKE 2 PUFFS BY MOUTH EVERY 6 HOURS AS NEEDED (Patient taking differently: Inhale 2 puffs into the lungs every 6 (six) hours as needed for wheezing or shortness of breath.)   ALPRAZolam (XANAX) 1 MG tablet TAKE 1 TABLET 3 TIMES A DAY AS NEEDED FOR ANXIETY OR SLEEP   BREO ELLIPTA 100-25 MCG/INH AEPB INHALE 1 PUFF BY MOUTH EVERY DAY   buPROPion (WELLBUTRIN SR) 100 MG 12 hr tablet TAKE 1 TABLET BY MOUTH TWICE A DAY   Cholecalciferol 1000 UNITS tablet Take 2 tablets (2,000 Units total) by mouth daily.   diphenhydrAMINE HCl (ZZZQUIL) 50 MG/30ML LIQD Take 50 mg by mouth at bedtime as  needed (sleep).   escitalopram (LEXAPRO) 20 MG tablet TAKE 1 TABLET BY MOUTH EVERY DAY   furosemide (LASIX) 40 MG tablet Take 1 tablet (40 mg total) by mouth daily as needed. (Patient taking differently: Take 40 mg by mouth daily as needed for fluid.)   HYDROcodone-acetaminophen (NORCO/VICODIN) 5-325 MG tablet Take 1 tablet by mouth every 6 (six) hours as needed for severe pain.   HYDROcodone-acetaminophen (NORCO/VICODIN) 5-325 MG tablet Take 1 tablet by mouth every 6 (six) hours as needed for severe pain.   HYDROcodone-acetaminophen (NORCO/VICODIN) 5-325 MG tablet Take 1 tablet by mouth every 6 (six) hours as needed for moderate pain or severe pain.   ibuprofen (ADVIL) 600 MG tablet TAKE 1 TABLET (600 MG TOTAL) BY MOUTH 2 (TWO) TIMES DAILY AS NEEDED.   Melatonin 10 MG CAPS Take 10 mg by mouth at bedtime as needed (sleep).   Multiple Vitamin (MULTIVITAMIN) tablet Take 1 tablet by mouth 2 (two) times daily.    niacinamide 500 MG tablet Take 500 mg by mouth 2 (two) times daily.    pentoxifylline (TRENTAL) 400 MG CR tablet Take 400 mg by mouth daily.    vitamin B-12 (CYANOCOBALAMIN) 1000 MCG tablet Take 1,000 mcg by mouth daily.   No facility-administered encounter medications on file as of 02/22/2020.    Allergies (verified) Adhesive [tape]   History: Past Medical History:  Diagnosis Date   Anal fissure 2012   Dr Ardis Hughs   Anxiety    Arthritis  Asthma    COLD WEATHER RELATED, bronchitis   Baker's cyst of knee    Right knee   Complication of anesthesia    PANIC ATTACK RIGHT BEFORE SHOULDER REPLACMENT SURGERY AT [facility]; PT STATES CLAUSTROPHOBIA AND DOES NOT WANT TO BE AWARE OF BEING STRAPPED DOWN IN OR   Depression    HTN (hypertension)    after having lap band surgery she was taken off medications   LBP (low back pain)    Obesity    Osteomyelitis (HCC)    at [facility]   Pain    HX OF BILATERAL SHOULDER [place] ;  HX OF BILATERAL KNEE PAIN   Sepsis(995.91) 2005    From shoulder replacement surgery   Shoulder fracture, left    Past Surgical History:  Procedure Laterality Date   APPENDECTOMY     BREATH TEK H PYLORI  07/15/2011   Procedure: BREATH TEK H PYLORI;  Surgeon: Pedro Earls, MD;  Location: [facility];  Service: General;  Laterality: N/A;   CHOLECYSTECTOMY     LAPAROSCOPIC GASTRIC BANDING  01/21/2012   Procedure: LAPAROSCOPIC GASTRIC BANDING;  Surgeon: Pedro Earls, MD;  Location: [facility];  Service: General;  Laterality: N/A;   LASER ABLATION     MESH APPLIED TO LAP PORT  01/21/2012   Procedure: MESH APPLIED TO LAP PORT;  Surgeon: Pedro Earls, MD;  Location: [facility];  Service: General;;   [facility] Right 07/27/2019   Procedure: RIGHT SHOUDLER ARTHROSCOPY, TISSUE SAMPLE;  Surgeon: Meredith Pel, MD;  Location: [facility];  Service: Orthopedics;  Laterality: Right;   TONSILLECTOMY     TOTAL SHOULDER REPLACEMENT     Right   TUBAL LIGATION     UTERINE ABLATION WAS DONE AT THE TIME OF TUBAL LIGATION   Family History  Problem Relation Age of Onset   Arthritis Mother    Diabetes Mother    Hyperlipidemia Mother    Hypertension Mother    Arthritis Father    Diabetes Father    Hyperlipidemia Father    Hypertension Father    Heart disease Father    Coronary artery disease Other    Hyperlipidemia Other    Hypertension Other    Bipolar disorder Brother    Anxiety disorder Neg Hx    Depression Neg Hx    Dementia Neg Hx    Alcohol abuse Neg Hx    Drug abuse Neg Hx    Schizophrenia Neg Hx    Social History   Socioeconomic History   Marital status: Married    Spouse name: Not on file   Number of children: Not on file   Years of education: Not on file   Highest education level: Not on file  Occupational History   Occupation: Housewife    Employer: UNEMPLOYED  Tobacco Use   Smoking status: Current Every Day Smoker    Packs/day: 1.00    Years: 31.00    Pack years: 31.00     Types: Cigarettes    Start date: 08/12/1980   Smokeless tobacco: Never Used   Tobacco comment: WAS SMOKING 2 PPD-HAS CUT BACK TO 1 PPD OR LESS  Vaping Use   Vaping Use: Never used  Substance and Sexual Activity   Alcohol use: No   Drug use: No   Sexual activity: Yes  Other Topics Concern   Not on file  Social History Narrative   Not on file   Social  Determinants of Health   Financial Resource Strain: Low Risk    Difficulty of Paying Living Expenses: Not hard at all  Food Insecurity: No Food Insecurity   Worried About [place] in the Last Year: Never true   Ran Out of Food in the Last Year: Never true  Transportation Needs: No Transportation Needs   Lack of Transportation (Medical): No   Lack of Transportation (Non-Medical): No  Physical Activity: Inactive   Days of Exercise per Week: 0 days   Minutes of Exercise per Session: 0 min  Stress: No Stress Concern Present   Feeling of Stress : Not at all  Social Connections: Moderately Isolated   Frequency of Communication with Friends and Family: More than three times a week   Frequency of Social Gatherings with Friends and Family: Once a week   Attends Religious Services: Never   Marine scientist or Organizations: No   Attends Music therapist: Never   Marital Status: Married    Tobacco Counseling Ready to quit: Not Answered Counseling given: Not Answered Comment: WAS SMOKING 2 PPD-HAS CUT BACK TO 1 PPD OR LESS   Clinical Intake:  Pre-visit preparation completed: Yes  Pain Score: 7  Pain Type: Chronic pain Pain Location: Knee Pain Orientation: Right Pain Radiating Towards: none Pain Descriptors / Indicators: Constant,Throbbing,Discomfort,Dull,Crushing Pain Onset: More than a month ago Pain Frequency: Constant Pain Relieving Factors: Pain meds, Voltaren Gel and ibuprofen Effect of Pain on Daily Activities: Pain produces disability and affects the quality of life.  Pain Relieving  Factors: Pain meds, Voltaren Gel and ibuprofen  BMI - recorded: 62.15 Nutritional Risks: None Diabetes: No  How often do you need to have someone help you when you read instructions, pamphlets, or other written materials from your doctor or pharmacy?: 1 - Never What is the last grade level you completed in school?: HSG; some college  Diabetic? no  Interpreter Needed?: No  Information entered by :: Lisette Abu, LPN   Activities of Daily Living In your present state of health, do you have any difficulty performing the following activities: 02/22/2020 07/27/2019  Hearing? N -  Vision? N -  Comment - -  Difficulty concentrating or making decisions? N -  Walking or climbing stairs? N -  Dressing or bathing? N -  Doing errands, shopping? N N  Preparing Food and eating ? N -  Using the Toilet? N -  In the past six months, have you accidently leaked urine? N -  Do you have problems with loss of bowel control? N -  Managing your Medications? N -  Managing your Finances? N -  Housekeeping or managing your Housekeeping? N -  Some recent data might be hidden    Patient Care Team: Plotnikov, Evie Lacks, MD as PCP - General Dean, Tonna Corner, MD (Orthopedic Surgery) Milus Banister, MD (Gastroenterology) Charlcie Cradle, MD (Psychiatry) Rolm Bookbinder, MD as Consulting Physician (Dermatology)  Indicate any recent Medical Services you may have received from other than [facility] providers in the past year (date may be approximate).     Assessment:   This is a routine wellness examination for Avrielle.  Hearing/Vision screen No exam data present  Dietary issues and exercise activities discussed: Current Exercise Habits: The patient does not participate in regular exercise at present, Exercise limited by: orthopedic condition(s);respiratory conditions(s);psychological condition(s)  Goals      lose weight so can I play in the dirt and plant flowers  Go to see doctor Hassell Done to  start the lap band process, be more active, when we get the pool filled I will start walking laps.         Depression Screen PHQ 2/9 Scores 02/22/2020 07/20/2019 06/14/2016 04/23/2016 04/19/2015  PHQ - 2 Score 0 2 3 4  0  PHQ- 9 Score - 3 11 12  -    Fall Risk Fall Risk  02/22/2020 08/20/2016 06/14/2016 04/23/2016  Falls in the past year? 0 No No No  Number falls in past yr: 0 - - -  Injury with Fall? 0 - - -  Risk for fall due to : No Fall Risks - - -    FALL RISK PREVENTION PERTAINING TO THE HOME:  Any stairs in or around the home? No  If so, are there any without handrails? No  Home free of loose throw rugs in walkways, pet beds, electrical cords, etc? Yes  Adequate lighting in your home to reduce risk of falls? Yes   ASSISTIVE DEVICES UTILIZED TO PREVENT FALLS:  Life alert? No  Use of a cane, walker or w/c? No  Grab bars in the bathroom? No  Shower chair or bench in shower? No  Elevated toilet seat or a handicapped toilet? No   TIMED UP AND GO:  Was the test performed? No .  Length of time to ambulate 10 feet: 0 sec.   Gait steady and fast without use of assistive device  Cognitive Function: Normal cognitive status assessed by direct observation by this Nurse Health Advisor. No abnormalities found.          Immunizations Immunization History  Administered Date(s) Administered   Influenza Split 12/07/2010, 10/04/2011   Influenza Whole 11/04/2007   Influenza,inj,Quad PF,6+ Mos 09/17/2012, 10/22/2013, 10/14/2014, 10/18/2015, 10/18/2016, 10/13/2017, 09/07/2018, 10/29/2019   PPD Test 06/05/2010   Tdap 11/01/2011    TDAP status: Up to date  Flu Vaccine status: Up to date  Pneumococcal vaccine status: Declined,  Education has been provided regarding the importance of this vaccine but patient still declined. Advised may receive this vaccine at local pharmacy or Health Dept. Aware to provide a copy of the vaccination record if obtained from local pharmacy or Health Dept.  Verbalized acceptance and understanding.   Covid-19 vaccine status: Declined, Education has been provided regarding the importance of this vaccine but patient still declined. Advised may receive this vaccine at local pharmacy or Health Dept.or vaccine clinic. Aware to provide a copy of the vaccination record if obtained from local pharmacy or Health Dept. Verbalized acceptance and understanding.  Qualifies for Shingles Vaccine? No   Zostavax completed No   Shingrix Completed?: No.    Education has been provided regarding the importance of this vaccine. Patient has been advised to call insurance company to determine out of pocket expense if they have not yet received this vaccine. Advised may also receive vaccine at local pharmacy or Health Dept. Verbalized acceptance and understanding.  Screening Tests Health Maintenance  Topic Date Due   Hepatitis C Screening  Never done   COVID-19 Vaccine (1) Never done   HIV Screening  Never done   PAP SMEAR-Modifier  01/17/2020   COLONOSCOPY (Pts 45-38yr Insurance coverage will need to be confirmed)  03/21/2020   TETANUS/TDAP  10/31/2021   INFLUENZA VACCINE  Completed    Health Maintenance  Health Maintenance Due  Topic Date Due   Hepatitis C Screening  Never done   COVID-19 Vaccine (1) Never done   HIV Screening  Never done   PAP SMEAR-Modifier  01/17/2020    Colorectal cancer screening: Type of screening: Colonoscopy. Completed 03/22/2010. Repeat every 10 years  Mammogram status: Completed 11/24/2018. Repeat every year   Lung Cancer Screening: (Low Dose CT Chest recommended if Age 76-80 years, 30 pack-year currently smoking OR have quit w/in 15years.) does qualify.   Lung Cancer Screening Referral: no  Additional Screening:  Hepatitis C Screening: does qualify; Completed no  Vision Screening: Recommended annual ophthalmology exams for early detection of glaucoma and other disorders of the eye. Is the patient up to date with their  annual eye exam?  Yes  Who is the provider or what is the name of the office in which the patient attends annual eye exams? [place] If pt is not established with a provider, would they like to be referred to a provider to establish care? No .   Dental Screening: Recommended annual dental exams for proper oral hygiene  Community Resource Referral / Chronic Care Management: CRR required this visit?  No   CCM required this visit?  No      Plan:     I have personally reviewed and noted the following in the patient's chart:   Medical and social history Use of alcohol, tobacco or illicit drugs  Current medications and supplements Functional ability and status Nutritional status Physical activity Advanced directives List of other physicians Hospitalizations, surgeries, and ER visits in previous 12 months Vitals Screenings to include cognitive, depression, and falls Referrals and appointments  In addition, I have reviewed and discussed with patient certain preventive protocols, quality metrics, and best practice recommendations. A written personalized care plan for preventive services as well as general preventive health recommendations were provided to patient.     Sheral Flow, LPN   04/05/760   Nurse Notes: n/a   Medical screening examination/treatment/procedure(s) were performed by non-physician practitioner and as supervising physician I was immediately available for consultation/collaboration.  I agree with above. Lew Dawes, MD

## 2020-03-07 ENCOUNTER — Other Ambulatory Visit: Payer: Self-pay

## 2020-03-07 ENCOUNTER — Telehealth (INDEPENDENT_AMBULATORY_CARE_PROVIDER_SITE_OTHER): Payer: BC Managed Care – PPO | Admitting: Family Medicine

## 2020-03-07 DIAGNOSIS — U071 COVID-19: Secondary | ICD-10-CM | POA: Diagnosis not present

## 2020-03-07 MED ORDER — BENZONATATE 100 MG PO CAPS: 100.0000 mg | ORAL_CAPSULE | Freq: Three times a day (TID) | ORAL | 0 refills | Status: DC | PRN

## 2020-03-07 NOTE — Patient Instructions (Addendum)
  HOME CARE TIPS:   -I sent the medication(s) we discussed to your pharmacy: Meds ordered this encounter  Medications  . benzonatate (TESSALON PERLES) 100 MG capsule    Sig: Take 1 capsule (100 mg total) by mouth 3 (three) times daily as needed.    Dispense:  20 capsule    Refill:  0     -I sent a message to the outpatient Covid treatment center letting them know that you are interested in treatment. Currently there are very limited treatment doses available and the are calling the folks that are at the highest risk of hospitalization or severe disease first.   -can use tylenol or aleve if needed for fevers, aches and pains per instructions  -can use nasal saline a few times per day if you have nasal congestion; sometimes  a short course of Afrin nasal spray for 3 days can help with symptoms as well  -stay hydrated, drink plenty of fluids and eat small healthy meals - avoid dairy  -can take 1000 IU (58mg) Vit D3 and 100-500 mg of Vit C daily per instructions  -If the Covid test is positive, check out the CDC website for more information on home care, transmission and treatment for COVID19  -follow up with your doctor in 2-3 days unless improving and feeling better  -stay home while sick, except to seek medical care, and if you have [place] it would be best to stay home for a full 10 days since the onset of symptoms PLUS one day of no fever and feeling better. Wear a good mask (such as N95 or KN95) if around others to reduce the risk of transmission.  It was nice to meet you today, and I really hope you are feeling better soon. I help [facility] out with telemedicine visits on Tuesdays and Thursdays and am available for visits on those days. If you have any concerns or questions following this visit please schedule a follow up visit with your Primary Care doctor or seek care at a local urgent care clinic to avoid delays in care.    Seek in person care or schedule a follow up  video visit promptly if your symptoms worsen, new concerns arise or you are not improving with treatment. Call 911 and/or seek emergency care if your symptoms are severe or life threatening.

## 2020-03-07 NOTE — Progress Notes (Signed)
Virtual Visit via Video Note  I connected with [name]  on 03/07/20 at  5:20 PM EST by a video enabled telemedicine application and verified that I am speaking with the correct person using two identifiers.  Location patient: home, [place] Location provider:work or home office Persons participating in the virtual visit: patient, provider  I discussed the limitations of evaluation and management by telemedicine and the availability of in person appointments. The patient expressed understanding and agreed to proceed.   HPI:  Acute telemedicine visit for COVID19 illness: -Onset: yesterday -Symptoms include: dry cough, headache, chills, fever 100.5, sinus pressure -had positive covid test today -99.7 temp currently -Denies: CP, SOB, NVD, inability to eat/drink/get out of bed, no known sick contacts -Has tried: ibuprofen, breo -Pertinent past medical history: see below, sig for asthma, smoker, HTN, obesity  -Pertinent medication allergies: adhesive tape -COVID-19 vaccine status: not vaccinated  ROS: See pertinent positives and negatives per HPI.  Past Medical History:  Diagnosis Date  . Anal fissure 2012   Dr Ardis Hughs  . Anxiety   . Arthritis   . Asthma    COLD WEATHER RELATED, bronchitis  . Baker's cyst of knee    Right knee  . Complication of anesthesia    PANIC ATTACK RIGHT BEFORE SHOULDER REPLACMENT SURGERY AT [facility]; PT STATES CLAUSTROPHOBIA AND DOES NOT WANT TO BE AWARE OF BEING STRAPPED DOWN IN OR  . Depression   . HTN (hypertension)    after having lap band surgery she was taken off medications  . LBP (low back pain)   . Obesity   . Osteomyelitis ([place])    at [facility]  . Pain    HX OF BILATERAL SHOULDER SURGERIES-CHRONIC SHOULDER PAIN ;  HX OF BILATERAL KNEE PAIN  . Sepsis(995.91) 2005   From shoulder replacement surgery  . Shoulder fracture, left     Past Surgical History:  Procedure Laterality Date  . APPENDECTOMY    . BREATH TEK H PYLORI  07/15/2011   Procedure: BREATH TEK  H PYLORI;  Surgeon: Pedro Earls, MD;  Location: [facility];  Service: General;  Laterality: N/A;  . CHOLECYSTECTOMY    . LAPAROSCOPIC GASTRIC BANDING  01/21/2012   Procedure: LAPAROSCOPIC GASTRIC BANDING;  Surgeon: Pedro Earls, MD;  Location: [facility];  Service: General;  Laterality: N/A;  . LASER ABLATION    . MESH APPLIED TO LAP PORT  01/21/2012   Procedure: MESH APPLIED TO LAP PORT;  Surgeon: Pedro Earls, MD;  Location: [facility];  Service: General;;  . PLANTAR FASCIA RELEASE  1996  . SHOULDER ARTHROSCOPY Right 07/27/2019   Procedure: RIGHT SHOUDLER ARTHROSCOPY, TISSUE SAMPLE;  Surgeon: Meredith Pel, MD;  Location: [place];  Service: Orthopedics;  Laterality: Right;  . TONSILLECTOMY    . TOTAL SHOULDER REPLACEMENT     Right  . TUBAL LIGATION     UTERINE ABLATION WAS DONE AT THE TIME OF TUBAL LIGATION     Current Outpatient Medications:  .  benzonatate (TESSALON PERLES) 100 MG capsule, Take 1 capsule (100 mg total) by mouth 3 (three) times daily as needed., Disp: 20 capsule, Rfl: 0 .  albuterol (VENTOLIN HFA) 108 (90 Base) MCG/ACT inhaler, TAKE 2 PUFFS BY MOUTH EVERY 6 HOURS AS NEEDED (Patient taking differently: Inhale 2 puffs into the lungs every 6 (six) hours as needed for wheezing or shortness of breath.), Disp: 8 g, Rfl: 2 .  ALPRAZolam (XANAX) 1 MG tablet, TAKE 1 TABLET 3 TIMES A DAY AS NEEDED  FOR ANXIETY OR SLEEP, Disp: 90 tablet, Rfl: 2 .  BREO ELLIPTA 100-25 MCG/INH AEPB, INHALE 1 PUFF BY MOUTH EVERY DAY, Disp: 180 each, Rfl: 3 .  buPROPion (WELLBUTRIN SR) 100 MG 12 hr tablet, TAKE 1 TABLET BY MOUTH TWICE A DAY, Disp: 180 tablet, Rfl: 1 .  Cholecalciferol 1000 UNITS tablet, Take 2 tablets (2,000 Units total) by mouth daily., Disp: 100 tablet, Rfl: 3 .  diphenhydrAMINE HCl (ZZZQUIL) 50 MG/30ML LIQD, Take 50 mg by mouth at bedtime as needed (sleep)., Disp: , Rfl:  .  escitalopram (LEXAPRO) 20 MG tablet, TAKE 1 TABLET BY MOUTH EVERY DAY, Disp: 90 tablet, Rfl: 1 .   furosemide (LASIX) 40 MG tablet, Take 1 tablet (40 mg total) by mouth daily as needed. (Patient taking differently: Take 40 mg by mouth daily as needed for fluid.), Disp: 90 tablet, Rfl: 3 .  HYDROcodone-acetaminophen (NORCO/VICODIN) 5-325 MG tablet, Take 1 tablet by mouth every 6 (six) hours as needed for severe pain., Disp: 120 tablet, Rfl: 0 .  HYDROcodone-acetaminophen (NORCO/VICODIN) 5-325 MG tablet, Take 1 tablet by mouth every 6 (six) hours as needed for severe pain., Disp: 120 tablet, Rfl: 0 .  HYDROcodone-acetaminophen (NORCO/VICODIN) 5-325 MG tablet, Take 1 tablet by mouth every 6 (six) hours as needed for moderate pain or severe pain., Disp: 120 tablet, Rfl: 0 .  ibuprofen (ADVIL) 600 MG tablet, TAKE 1 TABLET (600 MG TOTAL) BY MOUTH 2 (TWO) TIMES DAILY AS NEEDED., Disp: 180 tablet, Rfl: 0 .  Melatonin 10 MG CAPS, Take 10 mg by mouth at bedtime as needed (sleep)., Disp: , Rfl:  .  Multiple Vitamin (MULTIVITAMIN) tablet, Take 1 tablet by mouth 2 (two) times daily. , Disp: , Rfl:  .  niacinamide 500 MG tablet, Take 500 mg by mouth 2 (two) times daily. , Disp: , Rfl:  .  pentoxifylline (TRENTAL) 400 MG CR tablet, Take 400 mg by mouth daily. , Disp: , Rfl:  .  vitamin B-12 (CYANOCOBALAMIN) 1000 MCG tablet, Take 1,000 mcg by mouth daily., Disp: , Rfl:   EXAM:  VITALS per patient if applicable:  GENERAL: alert, oriented, appears well and in no acute distress  HEENT: atraumatic, conjunttiva clear, no obvious abnormalities on inspection of external nose and ears  NECK: normal movements of the head and neck  LUNGS: on inspection no signs of respiratory distress, breathing rate appears normal, no obvious gross SOB, gasping or wheezing  CV: no obvious cyanosis  MS: moves all visible extremities without noticeable abnormality  PSYCH/NEURO: pleasant and cooperative, no obvious depression or anxiety, speech and thought processing grossly intact  ASSESSMENT AND PLAN:  Discussed the  following assessment and plan:  COVID-19 - Plan: Ambulatory referral for Covid Treatment  -we discussed possible serious and likely etiologies, options for evaluation and workup, limitations of telemedicine visit vs in person visit, treatment, treatment risks and precautions. Pt prefers to treat via telemedicine empirically rather than in person at this moment.  Discussed treatment options, ideal treatment window, potential complications, isolation and precautions for COVID-19.  [facility].  She did want a prescription for cough, Tessalon Rx sent. Other symptomatic care measures summarized in patient instructions. Work/School slipped offered: declined Scheduled follow up with PCP offered: agrees to schedule follow up through PCP office if needed. Advised to seek prompt in person care if worsening, new symptoms arise, or if is not improving with treatment. Discussed options for inperson care if PCP office not available. Did  let this patient know that I only do telemedicine on Tuesdays and Thursdays for [place]. Advised to schedule follow up visit with PCP or [facility] if any further questions or concerns to avoid delays in care.   I discussed the assessment and treatment plan with the patient. The patient was provided an opportunity to ask questions and all were answered. The patient agreed with the plan and demonstrated an understanding of the instructions.     Lucretia Kern, DO

## 2020-03-08 ENCOUNTER — Telehealth: Payer: Self-pay

## 2020-03-08 NOTE — Telephone Encounter (Signed)
Called to discuss with patient about COVID-19 symptoms and the use of one of the available treatments for those with mild to moderate Covid symptoms and at a high risk of hospitalization.  Pt appears to qualify for outpatient treatment due to co-morbid conditions and/or a member of an at-risk group in accordance with the FDA Emergency Use Authorization.    Symptom onset: Cough,headache,fever 03/06/20 Vaccinated: No Booster? No Immunocompromised? No Qualifiers: Asthma,HTN,Obesity  Pt. States she will think about it. Given call back number [phone].  Marcello Moores

## 2020-03-13 ENCOUNTER — Encounter: Payer: Self-pay | Admitting: Adult Health

## 2020-03-13 ENCOUNTER — Ambulatory Visit (HOSPITAL_COMMUNITY)
Admission: RE | Admit: 2020-03-13 | Discharge: 2020-03-13 | Disposition: A | Discharge status: Home / Self Care | Payer: BC Managed Care – PPO | Source: Ambulatory Visit | Attending: Pulmonary Disease | Admitting: Pulmonary Disease

## 2020-03-13 ENCOUNTER — Other Ambulatory Visit: Payer: Self-pay | Admitting: Adult Health

## 2020-03-13 ENCOUNTER — Ambulatory Visit: Payer: BC Managed Care – PPO | Admitting: Orthopedic Surgery

## 2020-03-13 DIAGNOSIS — U071 COVID-19: Secondary | ICD-10-CM

## 2020-03-13 MED ORDER — ONDANSETRON HCL 4 MG/2ML IJ SOLN
4.0000 mg | Freq: Once | INTRAMUSCULAR | Status: AC
  Administered 2020-03-13: 4 mg via INTRAVENOUS
  Filled 2020-03-13: qty 2

## 2020-03-13 MED ORDER — METHYLPREDNISOLONE SODIUM SUCC 125 MG IJ SOLR: 125.0000 mg | Freq: Once | INTRAMUSCULAR | Status: DC | PRN

## 2020-03-13 MED ORDER — EPINEPHRINE 0.3 MG/0.3ML IJ SOAJ: 0.3000 mg | Freq: Once | INTRAMUSCULAR | Status: DC | PRN

## 2020-03-13 MED ORDER — FAMOTIDINE IN NACL 20-0.9 MG/50ML-% IV SOLN: 20.0000 mg | Freq: Once | INTRAVENOUS | Status: DC | PRN

## 2020-03-13 MED ORDER — SOTROVIMAB 500 MG/8ML IV SOLN
500.0000 mg | Freq: Once | INTRAVENOUS | Status: AC
  Administered 2020-03-13: 500 mg via INTRAVENOUS

## 2020-03-13 MED ORDER — ALBUTEROL SULFATE HFA 108 (90 BASE) MCG/ACT IN AERS: 2.0000 | INHALATION_SPRAY | Freq: Once | RESPIRATORY_TRACT | Status: DC | PRN

## 2020-03-13 MED ORDER — SODIUM CHLORIDE 0.9 % IV SOLN: INTRAVENOUS | Status: DC | PRN

## 2020-03-13 MED ORDER — DIPHENHYDRAMINE HCL 50 MG/ML IJ SOLN: 50.0000 mg | Freq: Once | INTRAMUSCULAR | Status: DC | PRN

## 2020-03-13 NOTE — Progress Notes (Signed)
Patient reviewed Fact Sheet for Patients, Parents, and Caregivers for Emergency Use Authorization (EUA) of sotrovimab for the Treatment of Coronavirus. Patient also reviewed and is agreeable to the estimated cost of treatment. Patient is agreeable to proceed.   

## 2020-03-13 NOTE — Progress Notes (Signed)
I connected by phone with Stephanie Harper on 03/13/2020 at 11:32 AM to discuss the potential use of a new treatment for mild to moderate COVID-19 viral infection in non-hospitalized patients.  This patient is a 50 y.o. female that meets the FDA criteria for Emergency Use Authorization of COVID monoclonal antibody sotrovimab.  Has a (+) direct SARS-CoV-2 viral test result  Has mild or moderate COVID-19   Is NOT hospitalized due to COVID-19  Is within 10 days of symptom onset  Has at least one of the high risk factor(s) for progression to severe COVID-19 and/or hospitalization as defined in EUA.  Specific high risk criteria : BMI > 25 and Chronic Lung Disease   Sx onset 03/07/2020  Cost reviewed   I have spoken and communicated the following to the patient or parent/caregiver regarding COVID monoclonal antibody treatment:  1. FDA has authorized the emergency use for the treatment of mild to moderate COVID-19 in adults and pediatric patients with positive results of direct SARS-CoV-2 viral testing who are 47 years of age and older weighing at least 40 kg, and who are at high risk for progressing to severe COVID-19 and/or hospitalization.  2. The significant known and potential risks and benefits of COVID monoclonal antibody, and the extent to which such potential risks and benefits are unknown.  3. Information on available alternative treatments and the risks and benefits of those alternatives, including clinical trials.  4. Patients treated with COVID monoclonal antibody should continue to self-isolate and use infection control measures (e.g., wear mask, isolate, social distance, avoid sharing personal items, clean and disinfect "high touch" surfaces, and frequent handwashing) according to CDC guidelines.   5. The patient or parent/caregiver has the option to accept or refuse COVID monoclonal antibody treatment.  After reviewing this information with the patient, the patient has agreed  to receive one of the available covid 19 monoclonal antibodies and will be provided an appropriate fact sheet prior to infusion. Scot Dock, NP 03/13/2020 11:32 AM

## 2020-03-13 NOTE — Progress Notes (Signed)
Diagnosis: COVID-19  Physician: Dr. Asencion Noble  Procedure: [facility]fusion [facility] Med: Sotrovimab infusion - Provided patient with sotrovimab fact sheet for patients, parents, and caregivers prior to infusion.   Complications: No immediate complications noted  Discharge: Discharged home

## 2020-03-13 NOTE — Discharge Instructions (Signed)

## 2020-03-27 ENCOUNTER — Ambulatory Visit (INDEPENDENT_AMBULATORY_CARE_PROVIDER_SITE_OTHER): Payer: BC Managed Care – PPO | Admitting: Orthopedic Surgery

## 2020-03-27 ENCOUNTER — Other Ambulatory Visit: Payer: Self-pay

## 2020-03-27 ENCOUNTER — Ambulatory Visit: Payer: Self-pay

## 2020-03-27 DIAGNOSIS — M1711 Unilateral primary osteoarthritis, right knee: Secondary | ICD-10-CM | POA: Diagnosis not present

## 2020-03-27 DIAGNOSIS — M25561 Pain in right knee: Secondary | ICD-10-CM

## 2020-03-28 ENCOUNTER — Other Ambulatory Visit: Payer: Self-pay | Admitting: Internal Medicine

## 2020-03-28 ENCOUNTER — Encounter: Payer: Self-pay | Admitting: Orthopedic Surgery

## 2020-03-28 MED ORDER — BETAMETHASONE SOD PHOS & ACET 6 (3-3) MG/ML IJ SUSP
6.0000 mg | INTRAMUSCULAR | Status: AC | PRN
  Administered 2020-03-27: 6 mg via INTRA_ARTICULAR

## 2020-03-28 MED ORDER — BUPIVACAINE HCL 0.25 % IJ SOLN
4.0000 mL | INTRAMUSCULAR | Status: AC | PRN
  Administered 2020-03-27: 4 mL via INTRA_ARTICULAR

## 2020-03-28 MED ORDER — LIDOCAINE HCL 1 % IJ SOLN
5.0000 mL | INTRAMUSCULAR | Status: AC | PRN
  Administered 2020-03-27: 5 mL

## 2020-03-28 NOTE — Progress Notes (Signed)
Office Visit Note   Patient: Stephanie Harper           Date of Birth: July 15, 1970           MRN: [id number] Visit Date: 03/27/2020 Requested by: Cassandria Anger, MD [place],  [place] [address] PCP: Plotnikov, Evie Lacks, MD  Subjective: Chief Complaint  Patient presents with  . Right Knee - Pain    HPI: Stephanie Harper is a 50 y.o. female who presents to the office complaining of right knee pain.  Patient states that she fell directly onto her right knee in January after slipping on ice.  Complains of anterior and medial sided pain.  Denies any improvement since the injury.  She notes that she has no pain with straightening the leg but pain is worse with flexion.  Pain does wake her up at night.  She is ambulating with a cane.  She notes popping and grinding but denies any mechanical locking symptoms.  She has had good relief with heat and she is taking a Vicodin and Advil for pain control.  Denies any history of diabetes.  Denies any previous injury to the right knee.  No history of surgery to the right knee.  Denies groin pain or radicular pain..                ROS: All systems reviewed are negative as they relate to the chief complaint within the history of present illness.  Patient denies fevers or chills.  Assessment & Plan: Visit Diagnoses:  1. Right knee pain, unspecified chronicity   2. Unilateral primary osteoarthritis, right knee     Plan: Patient is a 50 year old female who presents complaint of right knee pain.  Patient has had persistent right knee pain since falling directly on her right knee about 2 months ago.  Pain wakes her up at night.  Radiographs taken today are negative for any evidence of fracture or other acute injury but does show extensive arthritic changes throughout all 3 compartments, worst in the patellofemoral and medial compartments.  Discussed options available to patient.  After discussion, patient would like to proceed with right  knee cortisone injection.  Injection administered successfully patient tolerated procedure well.  Plan to preapproved patient for gel injections as well.  Follow-up after knee injection wears off  This patient is diagnosed with osteoarthritis of the knee(s).    Radiographs show evidence of joint space narrowing, osteophytes, subchondral sclerosis and/or subchondral cysts.  This patient has knee pain which interferes with functional and activities of daily living.    This patient has experienced inadequate response, adverse effects and/or intolerance with conservative treatments such as acetaminophen, NSAIDS, topical creams, physical therapy or regular exercise, knee bracing and/or weight loss.   This patient has experienced inadequate response or has a contraindication to intra articular steroid injections for at least 3 months.   This patient is not scheduled to have a total knee replacement within 6 months of starting treatment with viscosupplementation.   Follow-Up Instructions: No follow-ups on file.   Orders:  Orders Placed This Encounter  Procedures  . XR KNEE 3 VIEW RIGHT   No orders of the defined types were placed in this encounter.     Procedures: Large Joint Inj: R knee on 03/27/2020 9:58 AM Indications: diagnostic evaluation, joint swelling and pain Details: 18 G 1.5 in needle, superolateral approach  Arthrogram: No  Medications: 5 mL lidocaine 1 %; 4 mL bupivacaine 0.25 %;  6 mg betamethasone acetate-betamethasone sodium phosphate 6 (3-3) MG/ML Outcome: tolerated well, no immediate complications Procedure, treatment alternatives, risks and benefits explained, specific risks discussed. Consent was given by the patient. Immediately prior to procedure a time out was called to verify the correct patient, procedure, equipment, support staff and site/side marked as required. Patient was prepped and draped in the usual sterile fashion.       Clinical Data: No additional  findings.  Objective: Vital Signs: There were no vitals taken for this visit.  Physical Exam:  Constitutional: Patient appears well-developed HEENT:  Head: Normocephalic Eyes:EOM are normal Neck: Normal range of motion Cardiovascular: Normal rate Pulmonary/chest: Effort normal Neurologic: Patient is alert Skin: Skin is warm Psychiatric: Patient has normal mood and affect  Ortho Exam: Ortho exam demonstrates right knee with 0 degrees extension and 100 degrees of flexion.  Extensor mechanism intact with no extensor lag.  No calf tenderness.  Negative Homans' sign.  No pain with hip range of motion.  Tenderness over the medial joint line primarily with some lateral joint line tenderness.  No tenderness over the patellar tendon, patella, quadricep tendon.  Stable to anterior/posterior drawer and varus/valgus stress.  Specialty Comments:  No specialty comments available.  Imaging: No results found.   PMFS History: Patient Active Problem List   Diagnosis Date Noted  . Hemorrhoids 01/25/2019  . Leukocytosis 09/07/2018  . Palpitations 01/19/2018  . Heat stroke 07/22/2016  . Vitamin D deficiency 07/22/2014  . Well adult exam 04/26/2014  . Rash and nonspecific skin eruption 01/25/2014  . MDD (major depressive disorder) 06/17/2013  . Orthostatic lightheadedness 06/09/2012  . Venous insufficiency of leg 03/04/2012  . Lapband APS Jan 2014 01/21/2012  . Arthralgia 10/04/2011  . Night sweats 06/05/2010  . ANAL OR RECTAL PAIN 01/05/2010  . HEMATOCHEZIA 01/05/2010  . ELBOW PAIN 10/31/2009  . Acute upper respiratory infection 12/06/2008  . SHOULDER PAIN 12/06/2008  . CONTACT DERMATITIS 06/03/2008  . OBESITY, MORBID 05/04/2008  . TOBACCO USE DISORDER/SMOKER-SMOKING CESSATION DISCUSSED 07/28/2007  . Chronic anxiety 12/26/2006  . PANIC ATTACK 12/26/2006  . Essential hypertension 12/26/2006  . Asthma 12/26/2006  . Depression 08/28/2006  . LOW BACK PAIN 08/28/2006   Past Medical  History:  Diagnosis Date  . Anal fissure 2012   Dr Ardis Hughs  . Anxiety   . Arthritis   . Asthma    COLD WEATHER RELATED, bronchitis  . Baker's cyst of knee    Right knee  . Complication of anesthesia    PANIC ATTACK RIGHT BEFORE SHOULDER REPLACMENT SURGERY AT [facility]; PT STATES CLAUSTROPHOBIA AND DOES NOT WANT TO BE AWARE OF BEING STRAPPED DOWN IN OR  . Depression   . HTN (hypertension)    after having lap band surgery she was taken off medications  . LBP (low back pain)   . Obesity   . Osteomyelitis ([place])    at [facility]  . Pain    HX OF BILATERAL SHOULDER SURGERIES-CHRONIC SHOULDER PAIN ;  HX OF BILATERAL KNEE PAIN  . Sepsis(995.91) 2005   From shoulder replacement surgery  . Shoulder fracture, left     Family History  Problem Relation Age of Onset  . Arthritis Mother   . Diabetes Mother   . Hyperlipidemia Mother   . Hypertension Mother   . Arthritis Father   . Diabetes Father   . Hyperlipidemia Father   . Hypertension Father   . Heart disease Father   . Coronary artery disease Other   . Hyperlipidemia Other   .  Hypertension Other   . Bipolar disorder Brother   . Anxiety disorder Neg Hx   . Depression Neg Hx   . Dementia Neg Hx   . Alcohol abuse Neg Hx   . Drug abuse Neg Hx   . Schizophrenia Neg Hx     Past Surgical History:  Procedure Laterality Date  . APPENDECTOMY    . BREATH TEK H PYLORI  07/15/2011   Procedure: BREATH TEK H PYLORI;  Surgeon: Pedro Earls, MD;  Location: [facility];  Service: General;  Laterality: N/A;  . CHOLECYSTECTOMY    . LAPAROSCOPIC GASTRIC BANDING  01/21/2012   Procedure: LAPAROSCOPIC GASTRIC BANDING;  Surgeon: Pedro Earls, MD;  [facility];  Service: General;  Laterality: N/A;  . LASER ABLATION    . MESH APPLIED TO LAP PORT  01/21/2012   Procedure: MESH APPLIED TO LAP PORT;  Surgeon: Pedro Earls, MD;  Location[facility];  Service: General;;  . PLANTAR FASCIA RELEASE  1996  . SHOULDER ARTHROSCOPY Right 07/27/2019    Procedure: RIGHT SHOUDLER ARTHROSCOPY, TISSUE SAMPLE;  Surgeon: Meredith Pel, MD;  Location: [place];  Service: Orthopedics;  Laterality: Right;  . TONSILLECTOMY    . TOTAL SHOULDER REPLACEMENT     Right  . TUBAL LIGATION     UTERINE ABLATION WAS DONE AT THE TIME OF TUBAL LIGATION   Social History   Occupational History  . Occupation: Arboriculturist: UNEMPLOYED  Tobacco Use  . Smoking status: Current Every Day Smoker    Packs/day: 1.00    Years: 31.00    Pack years: 31.00    Types: Cigarettes    Start date: 08/12/1980  . Smokeless tobacco: Never Used  . Tobacco comment: WAS SMOKING 2 PPD-HAS CUT BACK TO 1 PPD OR LESS  Vaping Use  . Vaping Use: Never used  Substance and Sexual Activity  . Alcohol use: No  . Drug use: No  . Sexual activity: Yes

## 2020-04-24 ENCOUNTER — Other Ambulatory Visit: Payer: Self-pay

## 2020-04-25 ENCOUNTER — Ambulatory Visit (INDEPENDENT_AMBULATORY_CARE_PROVIDER_SITE_OTHER): Payer: BC Managed Care – PPO | Admitting: Internal Medicine

## 2020-04-25 ENCOUNTER — Other Ambulatory Visit: Payer: Self-pay

## 2020-04-25 ENCOUNTER — Encounter: Payer: Self-pay | Admitting: Internal Medicine

## 2020-04-25 DIAGNOSIS — G8929 Other chronic pain: Secondary | ICD-10-CM

## 2020-04-25 DIAGNOSIS — E559 Vitamin D deficiency, unspecified: Secondary | ICD-10-CM

## 2020-04-25 DIAGNOSIS — M5440 Lumbago with sciatica, unspecified side: Secondary | ICD-10-CM

## 2020-04-25 DIAGNOSIS — F410 Panic disorder [episodic paroxysmal anxiety] without agoraphobia: Secondary | ICD-10-CM

## 2020-04-25 DIAGNOSIS — H6531 Chronic mucoid otitis media, right ear: Secondary | ICD-10-CM

## 2020-04-25 DIAGNOSIS — H6690 Otitis media, unspecified, unspecified ear: Secondary | ICD-10-CM | POA: Insufficient documentation

## 2020-04-25 MED ORDER — CEFDINIR 300 MG PO CAPS: 300.0000 mg | ORAL_CAPSULE | Freq: Two times a day (BID) | ORAL | 0 refills | Status: DC

## 2020-04-25 MED ORDER — HYDROCODONE-ACETAMINOPHEN 5-325 MG PO TABS: 1.0000 | ORAL_TABLET | Freq: Four times a day (QID) | ORAL | 0 refills | Status: DC | PRN

## 2020-04-25 MED ORDER — METHOCARBAMOL 500 MG PO TABS
500.0000 mg | ORAL_TABLET | Freq: Two times a day (BID) | ORAL | 0 refills | Status: DC | PRN
Start: 2020-04-25 — End: 2020-05-21

## 2020-04-25 MED ORDER — ALPRAZOLAM 1 MG PO TABS: ORAL_TABLET | ORAL | 2 refills | Status: DC

## 2020-04-25 NOTE — Assessment & Plan Note (Signed)
Omnicef x 10 d

## 2020-04-25 NOTE — Assessment & Plan Note (Signed)
Vit D 

## 2020-04-25 NOTE — Assessment & Plan Note (Signed)
Xanax prn  Potential benefits of a long term benzodiazepines  use as well as potential risks  and complications were explained to the patient and were aknowledged.

## 2020-04-25 NOTE — Progress Notes (Signed)
Subjective:  Patient ID: Stephanie Harper, female    DOB: 19-Mar-1970  Age: 51 y.o. MRN: [id number]  CC: Medical Management of Chronic Issues (3 m f/u with refills on controlled rx ) and right knee (More painful lately. )   HPI Stephanie Harper presents for chronic pain, anxiety, asthma R knee pain  - Stephanie Harper saw Dr Marlou Sa; she may be a candidate for total knee replacement at some point. She is complaining of right ear pain and stopped up sensation over 6 weeks duration 41 new 13-year-old dogs adopted -1 black lab in 1 visla  Outpatient Medications Prior to Visit  Medication Sig Dispense Refill  . albuterol (VENTOLIN HFA) 108 (90 Base) MCG/ACT inhaler TAKE 2 PUFFS BY MOUTH EVERY 6 HOURS AS NEEDED (Patient taking differently: Inhale 2 puffs into the lungs every 6 (six) hours as needed for wheezing or shortness of breath.) 8 g 2  . ALPRAZolam (XANAX) 1 MG tablet TAKE 1 TABLET 3 TIMES A DAY AS NEEDED FOR ANXIETY OR SLEEP 90 tablet 2  . BREO ELLIPTA 100-25 MCG/INH AEPB INHALE 1 PUFF BY MOUTH EVERY DAY 180 each 3  . buPROPion (WELLBUTRIN SR) 100 MG 12 hr tablet Take 1 tablet (100 mg total) by mouth 2 (two) times daily. Must keep scheduled appt for future refills 60 tablet 3  . Cholecalciferol 1000 UNITS tablet Take 2 tablets (2,000 Units total) by mouth daily. 100 tablet 3  . escitalopram (LEXAPRO) 20 MG tablet TAKE 1 TABLET BY MOUTH EVERY DAY 90 tablet 1  . furosemide (LASIX) 40 MG tablet Take 1 tablet (40 mg total) by mouth daily as needed. (Patient taking differently: Take 40 mg by mouth daily as needed for fluid.) 90 tablet 3  . HYDROcodone-acetaminophen (NORCO/VICODIN) 5-325 MG tablet Take 1 tablet by mouth every 6 (six) hours as needed for severe pain. 120 tablet 0  . HYDROcodone-acetaminophen (NORCO/VICODIN) 5-325 MG tablet Take 1 tablet by mouth every 6 (six) hours as needed for severe pain. 120 tablet 0  . HYDROcodone-acetaminophen (NORCO/VICODIN) 5-325 MG tablet Take 1 tablet by mouth every  6 (six) hours as needed for moderate pain or severe pain. 120 tablet 0  . ibuprofen (ADVIL) 600 MG tablet TAKE 1 TABLET (600 MG TOTAL) BY MOUTH 2 (TWO) TIMES DAILY AS NEEDED. 180 tablet 1  . Melatonin 10 MG CAPS Take 10 mg by mouth at bedtime as needed (sleep).    . Multiple Vitamin (MULTIVITAMIN) tablet Take 1 tablet by mouth 2 (two) times daily.     . niacinamide 500 MG tablet Take 500 mg by mouth 2 (two) times daily.     . pentoxifylline (TRENTAL) 400 MG CR tablet Take 400 mg by mouth daily.     . vitamin B-12 (CYANOCOBALAMIN) 1000 MCG tablet Take 1,000 mcg by mouth daily.    . benzonatate (TESSALON PERLES) 100 MG capsule Take 1 capsule (100 mg total) by mouth 3 (three) times daily as needed. 20 capsule 0  . diphenhydrAMINE HCl (ZZZQUIL) 50 MG/30ML LIQD Take 50 mg by mouth at bedtime as needed (sleep).     No facility-administered medications prior to visit.    ROS: Review of Systems  Constitutional: Negative for activity change, appetite change, chills, fatigue and unexpected weight change.  HENT: Negative for congestion, mouth sores and sinus pressure.   Eyes: Negative for visual disturbance.  Respiratory: Negative for cough and chest tightness.   Gastrointestinal: Negative for abdominal pain and nausea.  Genitourinary: Negative for difficulty urinating, frequency  and vaginal pain.  Musculoskeletal: Positive for arthralgias and back pain. Negative for gait problem.  Skin: Negative for pallor and rash.  Neurological: Negative for dizziness, tremors, weakness, numbness and headaches.  Psychiatric/Behavioral: Negative for confusion, sleep disturbance and suicidal ideas. The patient is nervous/anxious.     Objective:  BP (!) 148/98   Pulse 94   Temp 98.4 F (36.9 C) (Oral)   Ht 5' 7"  (1.702 m)   Wt (!) 390 lb 3.2 oz (177 kg)   SpO2 97%   BMI 61.11 kg/m   BP Readings from Last 3 Encounters:  04/25/20 (!) 148/98  03/13/20 (!) 152/96  02/22/20 120/80    Wt Readings from Last  3 Encounters:  04/25/20 (!) 390 lb 3.2 oz (177 kg)  02/22/20 (!) 396 lb 12.8 oz (180 kg)  10/29/19 (!) 400 lb 3.2 oz (181.5 kg)    Physical Exam Constitutional:      General: She is not in acute distress.    Appearance: She is well-developed. She is obese.  HENT:     Head: Normocephalic.     Right Ear: External ear normal.     Left Ear: External ear normal.     Nose: Nose normal.  Eyes:     General:        Right eye: No discharge.        Left eye: No discharge.     Conjunctiva/sclera: Conjunctivae normal.     Pupils: Pupils are equal, round, and reactive to light.  Neck:     Thyroid: No thyromegaly.     Vascular: No JVD.     Trachea: No tracheal deviation.  Cardiovascular:     Rate and Rhythm: Normal rate and regular rhythm.     Heart sounds: Normal heart sounds.  Pulmonary:     Effort: No respiratory distress.     Breath sounds: No stridor. No wheezing.  Abdominal:     General: Bowel sounds are normal. There is no distension.     Palpations: Abdomen is soft. There is no mass.     Tenderness: There is no abdominal tenderness. There is no guarding or rebound.  Musculoskeletal:        General: Tenderness present.     Cervical back: Normal range of motion and neck supple.  Lymphadenopathy:     Cervical: No cervical adenopathy.  Skin:    Findings: No erythema or rash.  Neurological:     Mental Status: She is oriented to person, place, and time.     Cranial Nerves: No cranial nerve deficit.     Motor: No abnormal muscle tone.     Coordination: Coordination normal.     Deep Tendon Reflexes: Reflexes normal.  Psychiatric:        Behavior: Behavior normal.        Thought Content: Thought content normal.        Judgment: Judgment normal.     Lab Results  Component Value Date   WBC 12.8 (H) 10/29/2019   HGB 14.1 10/29/2019   HCT 42.4 10/29/2019   PLT 309.0 10/29/2019   GLUCOSE 96 10/29/2019   CHOL 168 08/03/2018   TRIG 134.0 08/03/2018   HDL 45.00 08/03/2018    LDLCALC 96 08/03/2018   ALT 22 10/29/2019   AST 19 10/29/2019   NA 140 10/29/2019   K 4.0 10/29/2019   CL 102 10/29/2019   CREATININE 0.59 10/29/2019   BUN 12 10/29/2019   CO2 32 10/29/2019   TSH 3.59  03/08/2019   INR 1.0 08/09/2007   HGBA1C 5.7 01/19/2016    No results found.  Assessment & Plan:    Walker Kehr, MD

## 2020-04-26 ENCOUNTER — Other Ambulatory Visit: Payer: Self-pay | Admitting: Internal Medicine

## 2020-04-26 NOTE — Assessment & Plan Note (Signed)
Chronic Norco  Potential benefits of a long term opioids use as well as potential risks (i.e. addiction risk, apnea etc) and complications (i.e. Somnolence, constipation and others) were explained to the patient and were aknowledged. Return to clinic in 3 months

## 2020-04-26 NOTE — Assessment & Plan Note (Signed)
Stephanie Harper lost 10 pounds.  Continue with diet

## 2020-05-17 ENCOUNTER — Ambulatory Visit (INDEPENDENT_AMBULATORY_CARE_PROVIDER_SITE_OTHER): Payer: BC Managed Care – PPO | Admitting: Orthopedic Surgery

## 2020-05-17 DIAGNOSIS — M1711 Unilateral primary osteoarthritis, right knee: Secondary | ICD-10-CM

## 2020-05-18 ENCOUNTER — Encounter: Payer: Self-pay | Admitting: Orthopedic Surgery

## 2020-05-18 DIAGNOSIS — M1711 Unilateral primary osteoarthritis, right knee: Secondary | ICD-10-CM

## 2020-05-18 MED ORDER — LIDOCAINE HCL 1 % IJ SOLN
5.0000 mL | INTRAMUSCULAR | Status: AC | PRN
  Administered 2020-05-18: 5 mL

## 2020-05-18 NOTE — Progress Notes (Signed)
Office Visit Note   Patient: Stephanie Harper           Date of Birth: 04/19/1970           MRN: [id number] Visit Date: 05/17/2020 Requested by: Cassandria Anger, MD [place],  [place] [address] PCP: Plotnikov, Evie Lacks, MD  Subjective: Chief Complaint  Patient presents with  . Right Knee - Pain    HPI: Stephanie Harper is a 50 year old patient with right knee pain.  She has known arthritis.  She has increased body mass index and is considering reinstitution of her Lap-Band procedure.  Reports recurrent pain in the right knee.  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              ROS: All systems reviewed are negative as they relate to the chief complaint within the history of present illness.  Patient denies  fevers or chills.   Assessment & Plan: Visit Diagnoses:  1. Unilateral primary osteoarthritis, right knee     Plan: Impression is right knee arthritis.  Plan is Monovisc sample injection today.  We will see how that does and we could consider repeat cortisone injection in 3 to 4 months depending on her response to the gel injection.  Continue with nonweightbearing quad strengthening exercises and avoid walking for exercise.  Follow-Up Instructions: Return if symptoms worsen or fail to improve.   Orders:  No orders of the defined types were placed in this encounter.  No orders of the defined types were placed in this encounter.     Procedures: Large Joint Inj: R knee on 05/18/2020 8:12 AM Indications: diagnostic evaluation, joint swelling and pain Details: 18 G 1.5 in needle, superolateral approach  Arthrogram: No  Medications: 5 mL lidocaine 1 % Outcome: tolerated well, no immediate complications Procedure, treatment alternatives, risks and benefits explained, specific risks discussed. Consent was given by the patient. Immediately prior to procedure a time out was called to verify  the correct patient, procedure, equipment, support staff and site/side marked as required. Patient was prepped and draped in the usual sterile fashion.       Clinical Data: No additional findings.  Objective: Vital Signs: There were no vitals taken for this visit.  Physical Exam:   Constitutional: Patient appears well-developed HEENT:  Head: Normocephalic Eyes:EOM are normal Neck: Normal range of motion Cardiovascular: Normal rate Pulmonary/chest: Effort normal Neurologic: Patient is alert Skin: Skin is warm Psychiatric: Patient has normal mood and affect    Ortho Exam: Ortho exam demonstrates no effusion in the right knee.  Range of motion is 0 to about 100 degrees of flexion.  Extensor mechanism is intact.  Has patellofemoral crepitus and global knee pain.  No groin pain with internal ex rotation of the leg no Trendelenburg gait.  Pedal pulses palpable.  Specialty Comments:  No specialty comments available.  Imaging: No results found.   PMFS History: Patient Active Problem List   Diagnosis Date Noted  . Otitis media 04/25/2020  . Hemorrhoids 01/25/2019  . Leukocytosis 09/07/2018  . Palpitations 01/19/2018  . Heat stroke 07/22/2016  . Vitamin D deficiency 07/22/2014  . Well adult exam 04/26/2014  . Rash and nonspecific skin eruption 01/25/2014  . MDD (major depressive disorder) 06/17/2013  . Orthostatic lightheadedness 06/09/2012  . Venous insufficiency of leg 03/04/2012  . Lapband APS Jan 2014 01/21/2012  . Arthralgia 10/04/2011  . Night  sweats 06/05/2010  . ANAL OR RECTAL PAIN 01/05/2010  . HEMATOCHEZIA 01/05/2010  . ELBOW PAIN 10/31/2009  . Acute upper respiratory infection 12/06/2008  . SHOULDER PAIN 12/06/2008  . CONTACT DERMATITIS 06/03/2008  . OBESITY, MORBID 05/04/2008  . TOBACCO USE DISORDER/SMOKER-SMOKING CESSATION DISCUSSED 07/28/2007  . Chronic anxiety 12/26/2006  . PANIC ATTACK 12/26/2006  . Essential hypertension 12/26/2006  . Asthma  12/26/2006  . Depression 08/28/2006  . LOW BACK PAIN 08/28/2006   Past Medical History:  Diagnosis Date  . Anal fissure 2012   Dr Ardis Hughs  . Anxiety   . Arthritis   . Asthma    COLD WEATHER RELATED, bronchitis  . Baker's cyst of knee    Right knee  . Complication of anesthesia    PANIC ATTACK RIGHT BEFORE SHOULDER REPLACMENT SURGERY AT [facility]; PT STATES CLAUSTROPHOBIA AND DOES NOT WANT TO BE AWARE OF BEING STRAPPED DOWN IN OR  . Depression   . HTN (hypertension)    after having lap band surgery she was taken off medications  . LBP (low back pain)   . Obesity   . Osteomyelitis ([place])    at [facility]  . Pain    HX OF BILATERAL SHOULDER SURGERIES-CHRONIC SHOULDER PAIN ;  HX OF BILATERAL KNEE PAIN  . Sepsis(995.91) 2005   From shoulder replacement surgery  . Shoulder fracture, left     Family History  Problem Relation Age of Onset  . Arthritis Mother   . Diabetes Mother   . Hyperlipidemia Mother   . Hypertension Mother   . Arthritis Father   . Diabetes Father   . Hyperlipidemia Father   . Hypertension Father   . Heart disease Father   . Coronary artery disease Other   . Hyperlipidemia Other   . Hypertension Other   . Bipolar disorder Brother   . Anxiety disorder Neg Hx   . Depression Neg Hx   . Dementia Neg Hx   . Alcohol abuse Neg Hx   . Drug abuse Neg Hx   . Schizophrenia Neg Hx     Past Surgical History:  Procedure Laterality Date  . APPENDECTOMY    . BREATH TEK H PYLORI  07/15/2011   Procedure: BREATH TEK H PYLORI;  Surgeon: Pedro Earls, MD;  Location: [facility];  Service: General;  Laterality: N/A;  . CHOLECYSTECTOMY    . LAPAROSCOPIC GASTRIC BANDING  01/21/2012   Procedure: LAPAROSCOPIC GASTRIC BANDING;  Surgeon: Pedro Earls, MD;  Location: [facility];  Service: General;  Laterality: N/A;  . LASER ABLATION    . MESH APPLIED TO LAP PORT  01/21/2012   Procedure: MESH APPLIED TO LAP PORT;  Surgeon: Pedro Earls, MD;  Location: [facility];  Service: General;;   . PLANTAR FASCIA RELEASE  1996  . SHOULDER ARTHROSCOPY Right 07/27/2019   Procedure: RIGHT SHOUDLER ARTHROSCOPY, TISSUE SAMPLE;  Surgeon: Meredith Pel, MD;  Location: [facility];  Service: Orthopedics;  Laterality: Right;  . TONSILLECTOMY    . TOTAL SHOULDER REPLACEMENT     Right  . TUBAL LIGATION     UTERINE ABLATION WAS DONE AT THE TIME OF TUBAL LIGATION   Social History   Occupational History  . Occupation: Arboriculturist: UNEMPLOYED  Tobacco Use  . Smoking status: Current Every Day Smoker    Packs/day: 1.00    Years: 31.00    Pack years: 31.00    Types: Cigarettes    Start date: 08/12/1980  . Smokeless tobacco: Never  Used  . Tobacco comment: WAS SMOKING 2 PPD-HAS CUT BACK TO 1 PPD OR LESS  Vaping Use  . Vaping Use: Never used  Substance and Sexual Activity  . Alcohol use: No  . Drug use: No  . Sexual activity: Yes

## 2020-05-21 ENCOUNTER — Other Ambulatory Visit: Payer: Self-pay | Admitting: Internal Medicine

## 2020-06-15 ENCOUNTER — Other Ambulatory Visit: Payer: Self-pay | Admitting: Internal Medicine

## 2020-06-15 DIAGNOSIS — F410 Panic disorder [episodic paroxysmal anxiety] without agoraphobia: Secondary | ICD-10-CM

## 2020-06-15 NOTE — Progress Notes (Signed)
Psychol ref

## 2020-06-27 ENCOUNTER — Encounter: Payer: Self-pay | Admitting: Internal Medicine

## 2020-06-27 ENCOUNTER — Telehealth (INDEPENDENT_AMBULATORY_CARE_PROVIDER_SITE_OTHER): Payer: BC Managed Care – PPO | Admitting: Internal Medicine

## 2020-06-27 DIAGNOSIS — I10 Essential (primary) hypertension: Secondary | ICD-10-CM

## 2020-06-27 DIAGNOSIS — E559 Vitamin D deficiency, unspecified: Secondary | ICD-10-CM | POA: Diagnosis not present

## 2020-06-27 DIAGNOSIS — G8929 Other chronic pain: Secondary | ICD-10-CM | POA: Diagnosis not present

## 2020-06-27 DIAGNOSIS — F325 Major depressive disorder, single episode, in full remission: Secondary | ICD-10-CM

## 2020-06-27 DIAGNOSIS — M5440 Lumbago with sciatica, unspecified side: Secondary | ICD-10-CM

## 2020-06-27 DIAGNOSIS — L659 Nonscarring hair loss, unspecified: Secondary | ICD-10-CM | POA: Diagnosis not present

## 2020-06-27 DIAGNOSIS — M2550 Pain in unspecified joint: Secondary | ICD-10-CM | POA: Diagnosis not present

## 2020-06-27 MED ORDER — HYDROCODONE-ACETAMINOPHEN 5-325 MG PO TABS: 1.0000 | ORAL_TABLET | Freq: Four times a day (QID) | ORAL | 0 refills | Status: DC | PRN

## 2020-06-27 MED ORDER — ALPRAZOLAM 1 MG PO TABS: ORAL_TABLET | ORAL | 2 refills | Status: DC

## 2020-06-27 NOTE — Progress Notes (Signed)
Virtual Visit via Video Note  I connected with Stephanie Harper on 06/27/20 at 11:00 AM EDT by a video enabled telemedicine application and verified that I am speaking with the correct person using two identifiers.   I discussed the limitations of evaluation and management by telemedicine and the availability of in person appointments. The patient expressed understanding and agreed to proceed.  I was located at our [facility] office. The patient was at home. There was no one else present in the visit.   History of Present Illness: We need to follow-up on chronic back pain, anxiety-worse, arthritis  There has been no chest pain, shortness of breath, abdominal pain, diarrhea, constipation   Observations/Objective: The patient appears to be in no acute distress, looks well.  Assessment and Plan:  See my Assessment and Plan. Follow Up Instructions:    I discussed the assessment and treatment plan with the patient. The patient was provided an opportunity to ask questions and all were answered. The patient agreed with the plan and demonstrated an understanding of the instructions.   The patient was advised to call back or seek an in-person evaluation if the symptoms worsen or if the condition fails to improve as anticipated.  I provided face-to-face time during this encounter. We were at different locations.   Walker Kehr, MD

## 2020-06-27 NOTE — Assessment & Plan Note (Signed)
On Furosemide prn

## 2020-06-28 ENCOUNTER — Encounter: Payer: Self-pay | Admitting: Internal Medicine

## 2020-06-28 NOTE — Assessment & Plan Note (Signed)
Continue with Norco as needed  Potential benefits of a long term opioids use as well as potential risks (i.e. addiction risk, apnea etc) and complications (i.e. Somnolence, constipation and others) were explained to the patient and were aknowledged.

## 2020-06-28 NOTE — Assessment & Plan Note (Signed)
Continue with vitamin D

## 2020-06-28 NOTE — Assessment & Plan Note (Signed)
Situational.  It has been worse.  She has been home confined due to high gas prices.  Continue with Wellbutrin and Paxil.  Psychology consultation is pending

## 2020-07-18 ENCOUNTER — Ambulatory Visit (INDEPENDENT_AMBULATORY_CARE_PROVIDER_SITE_OTHER): Payer: BC Managed Care – PPO | Admitting: Psychologist

## 2020-07-18 DIAGNOSIS — F320 Major depressive disorder, single episode, mild: Secondary | ICD-10-CM | POA: Diagnosis not present

## 2020-07-18 DIAGNOSIS — F411 Generalized anxiety disorder: Secondary | ICD-10-CM | POA: Diagnosis not present

## 2020-07-25 ENCOUNTER — Ambulatory Visit: Payer: BC Managed Care – PPO | Admitting: Internal Medicine

## 2020-07-25 ENCOUNTER — Ambulatory Visit (INDEPENDENT_AMBULATORY_CARE_PROVIDER_SITE_OTHER): Payer: BC Managed Care – PPO | Admitting: Psychologist

## 2020-07-25 DIAGNOSIS — F320 Major depressive disorder, single episode, mild: Secondary | ICD-10-CM

## 2020-07-25 DIAGNOSIS — F411 Generalized anxiety disorder: Secondary | ICD-10-CM

## 2020-07-26 ENCOUNTER — Other Ambulatory Visit: Payer: Self-pay | Admitting: Internal Medicine

## 2020-07-27 LAB — URINALYSIS, COMPLETE
Bacteria, UA: NONE SEEN /HPF
Bilirubin Urine: NEGATIVE
Glucose, UA: NEGATIVE
Hgb urine dipstick: NEGATIVE
Hyaline Cast: NONE SEEN /LPF
Ketones, ur: NEGATIVE
Leukocytes,Ua: NEGATIVE
Nitrite: NEGATIVE
Protein, ur: NEGATIVE
RBC / HPF: NONE SEEN /HPF (ref 0–2)
Specific Gravity, Urine: 1.019 (ref 1.001–1.035)
WBC, UA: NONE SEEN /HPF (ref 0–5)
pH: 6.5 (ref 5.0–8.0)

## 2020-07-27 LAB — CBC WITH DIFFERENTIAL/PLATELET
Absolute Monocytes: 728 cells/uL (ref 200–950)
Basophils Absolute: 43 cells/uL (ref 0–200)
Basophils Relative: 0.4 %
Eosinophils Absolute: 107 cells/uL (ref 15–500)
Eosinophils Relative: 1 %
HCT: 44 % (ref 35.0–45.0)
Hemoglobin: 14.7 g/dL (ref 11.7–15.5)
Lymphs Abs: 2044 cells/uL (ref 850–3900)
MCH: 30.1 pg (ref 27.0–33.0)
MCHC: 33.4 g/dL (ref 32.0–36.0)
MCV: 90.2 fL (ref 80.0–100.0)
MPV: 10.2 fL (ref 7.5–12.5)
Monocytes Relative: 6.8 %
Neutro Abs: 7779 cells/uL (ref 1500–7800)
Neutrophils Relative %: 72.7 %
Platelets: 321 10*3/uL (ref 140–400)
RBC: 4.88 10*6/uL (ref 3.80–5.10)
RDW: 13 % (ref 11.0–15.0)
Total Lymphocyte: 19.1 %
WBC: 10.7 10*3/uL (ref 3.8–10.8)

## 2020-07-27 LAB — VITAMIN D 25 HYDROXY (VIT D DEFICIENCY, FRACTURES): Vit D, 25-Hydroxy: 43 ng/mL (ref 30–100)

## 2020-07-27 LAB — T4, FREE: Free T4: 1.1 ng/dL (ref 0.8–1.8)

## 2020-07-27 LAB — COMPLETE METABOLIC PANEL WITH GFR
AG Ratio: 1.4 (calc) (ref 1.0–2.5)
ALT: 21 U/L (ref 6–29)
AST: 17 U/L (ref 10–35)
Albumin: 3.8 g/dL (ref 3.6–5.1)
Alkaline phosphatase (APISO): 85 U/L (ref 31–125)
BUN: 10 mg/dL (ref 7–25)
CO2: 30 mmol/L (ref 20–32)
Calcium: 9 mg/dL (ref 8.6–10.2)
Chloride: 103 mmol/L (ref 98–110)
Creat: 0.57 mg/dL (ref 0.50–0.99)
Globulin: 2.7 g/dL (calc) (ref 1.9–3.7)
Glucose, Bld: 97 mg/dL (ref 65–99)
Potassium: 4.3 mmol/L (ref 3.5–5.3)
Sodium: 140 mmol/L (ref 135–146)
Total Bilirubin: 0.5 mg/dL (ref 0.2–1.2)
Total Protein: 6.5 g/dL (ref 6.1–8.1)
eGFR: 111 mL/min/{1.73_m2} (ref >=60)

## 2020-07-27 LAB — SEDIMENTATION RATE: Sed Rate: 34 mm/h — ABNORMAL HIGH (ref 0–20)

## 2020-07-27 LAB — TSH: TSH: 3.67 mIU/L

## 2020-07-27 LAB — C-REACTIVE PROTEIN: CRP: 38.1 mg/L — ABNORMAL HIGH (ref <8.0)

## 2020-07-27 LAB — VITAMIN B12: Vitamin B-12: 934 pg/mL (ref 200–1100)

## 2020-07-29 ENCOUNTER — Other Ambulatory Visit: Payer: Self-pay | Admitting: Internal Medicine

## 2020-08-03 ENCOUNTER — Ambulatory Visit (INDEPENDENT_AMBULATORY_CARE_PROVIDER_SITE_OTHER): Payer: BC Managed Care – PPO | Admitting: Psychologist

## 2020-08-03 DIAGNOSIS — F411 Generalized anxiety disorder: Secondary | ICD-10-CM | POA: Diagnosis not present

## 2020-08-03 DIAGNOSIS — F320 Major depressive disorder, single episode, mild: Secondary | ICD-10-CM | POA: Diagnosis not present

## 2020-08-14 ENCOUNTER — Ambulatory Visit (INDEPENDENT_AMBULATORY_CARE_PROVIDER_SITE_OTHER): Payer: BC Managed Care – PPO | Admitting: Psychologist

## 2020-08-14 DIAGNOSIS — F320 Major depressive disorder, single episode, mild: Secondary | ICD-10-CM | POA: Diagnosis not present

## 2020-08-14 DIAGNOSIS — F411 Generalized anxiety disorder: Secondary | ICD-10-CM

## 2020-09-25 ENCOUNTER — Telehealth (INDEPENDENT_AMBULATORY_CARE_PROVIDER_SITE_OTHER): Payer: BC Managed Care – PPO | Admitting: Internal Medicine

## 2020-09-25 ENCOUNTER — Encounter: Payer: Self-pay | Admitting: Internal Medicine

## 2020-09-25 DIAGNOSIS — G8929 Other chronic pain: Secondary | ICD-10-CM

## 2020-09-25 DIAGNOSIS — F329 Major depressive disorder, single episode, unspecified: Secondary | ICD-10-CM

## 2020-09-25 DIAGNOSIS — M5440 Lumbago with sciatica, unspecified side: Secondary | ICD-10-CM

## 2020-09-25 DIAGNOSIS — F419 Anxiety disorder, unspecified: Secondary | ICD-10-CM

## 2020-09-25 MED ORDER — HYDROCODONE-ACETAMINOPHEN 5-325 MG PO TABS: 1.0000 | ORAL_TABLET | Freq: Four times a day (QID) | ORAL | 0 refills | Status: DC | PRN

## 2020-09-25 MED ORDER — ALPRAZOLAM 1 MG PO TABS
ORAL_TABLET | ORAL | 2 refills | Status: DC
Start: 2020-09-25 — End: 2021-01-24

## 2020-09-25 NOTE — Progress Notes (Signed)
Virtual Visit via Video Note  I connected with Stephanie Harper on 09/25/20 at  2:00 PM EDT by a video enabled telemedicine application and verified that I am speaking with the correct person using two identifiers.   I discussed the limitations of evaluation and management by telemedicine and the availability of in person appointments. The patient expressed understanding and agreed to proceed.  I was located at our [facility] [place] office. The patient was at home. There was no one else present in the visit.   History of Present Illness: We need to follow-up on depression, LBP/OA, knee pain, anxiety f/u Seeing Dr Michail Sermon for counseling     Observations/Objective: The patient appears to be in no acute distress, looks well.  Assessment and Plan:  See my Assessment and Plan. Follow Up Instructions:    I discussed the assessment and treatment plan with the patient. The patient was provided an opportunity to ask questions and all were answered. The patient agreed with the plan and demonstrated an understanding of the instructions.   The patient was advised to call back or seek an in-person evaluation if the symptoms worsen or if the condition fails to improve as anticipated.  I provided face-to-face time during this encounter. We were at different locations.   Walker Kehr, MD

## 2020-09-25 NOTE — Assessment & Plan Note (Signed)
Cont on Xanax prn Potential benefits of a long term benzodiazepines  use as well as potential risks  and complications were explained to the patient and were aknowledged. Situational. Continue with Wellbutrin and Paxil.  Psychology consultation - Seeing Dr Michail Sermon for counseling. Better

## 2020-09-25 NOTE — Assessment & Plan Note (Signed)
Cont on Norco prn  Potential benefits of a long term opioids use as well as potential risks (i.e. addiction risk, apnea etc) and complications (i.e. Somnolence, constipation and others) were explained to the patient and were aknowledged.

## 2020-09-25 NOTE — Assessment & Plan Note (Signed)
   Situational. Continue with Wellbutrin and Paxil.  Psychology consultation - Seeing Dr Michail Sermon for counseling. Better

## 2020-09-29 ENCOUNTER — Other Ambulatory Visit: Payer: Self-pay | Admitting: Internal Medicine

## 2020-10-07 IMAGING — US US BREAST*L* LIMITED INC AXILLA
1 series · 8 of 8 positions shown · non-contrast
Comparison: Previous exam(s).

ACR Breast Density Category a: The breast tissue is almost entirely
fatty.

CLINICAL DATA: Patient presents with a palpable lumps in the left
breast, associated with tenderness/pain.

EXAM:
DIGITAL DIAGNOSTIC BILATERAL MAMMOGRAM WITH CAD AND TOMO
ULTRASOUND LEFT BREAST

[Series 1: us breast*left* limited inc axilla · 0.07mm/px · 8 of 8 slices shown]
[im 1/8]
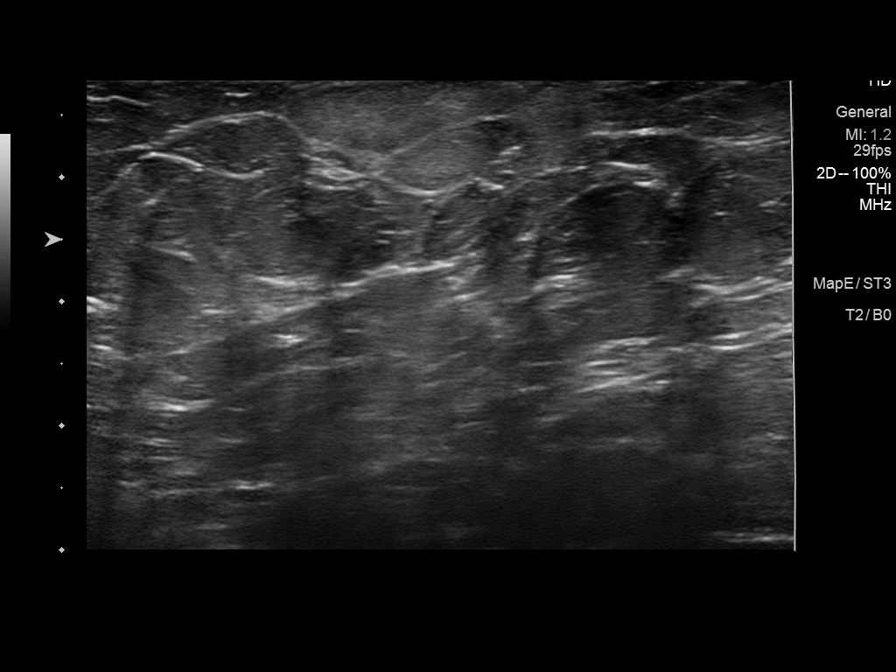
[im 2/8]
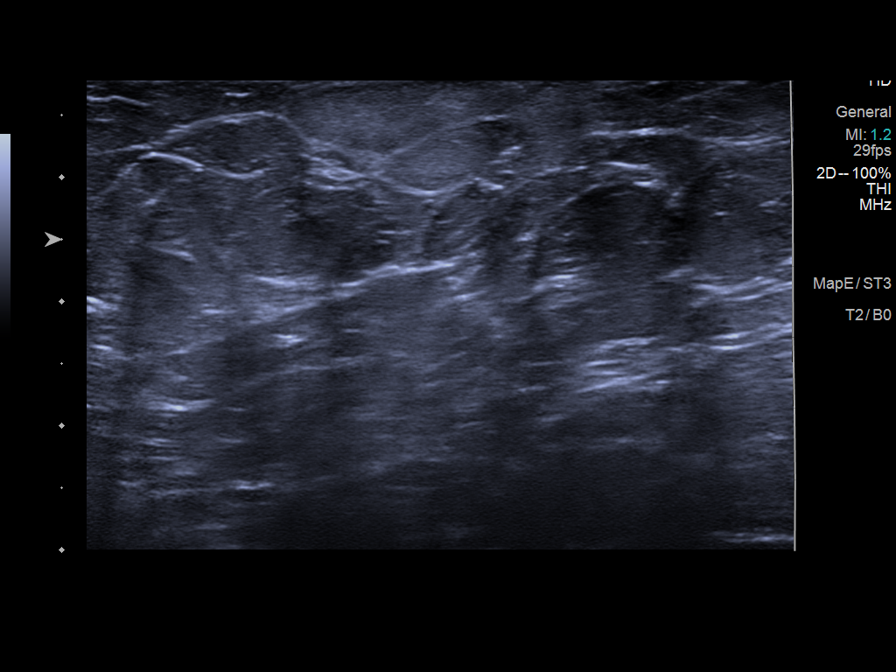
[im 3/8]
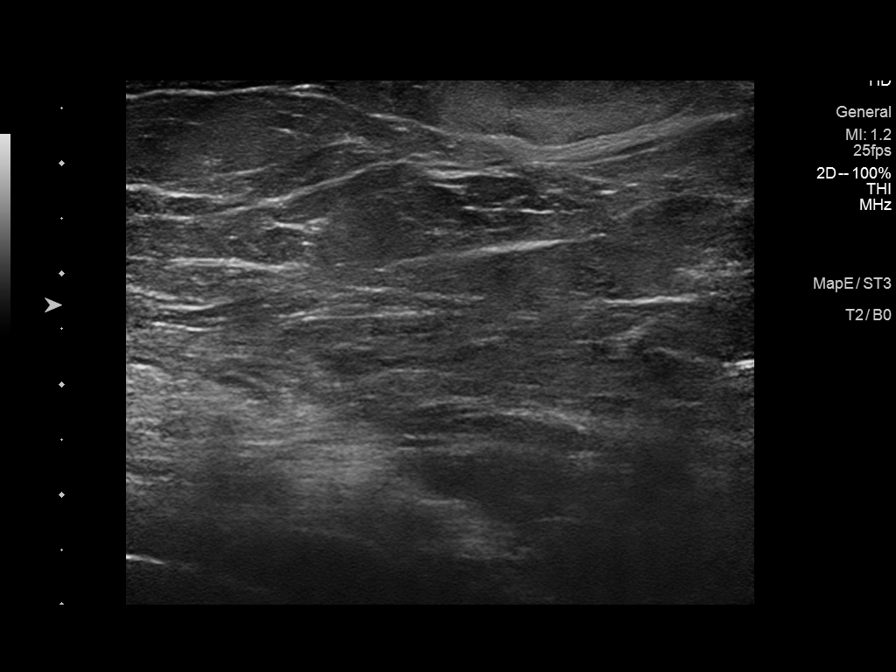
[im 4/8]
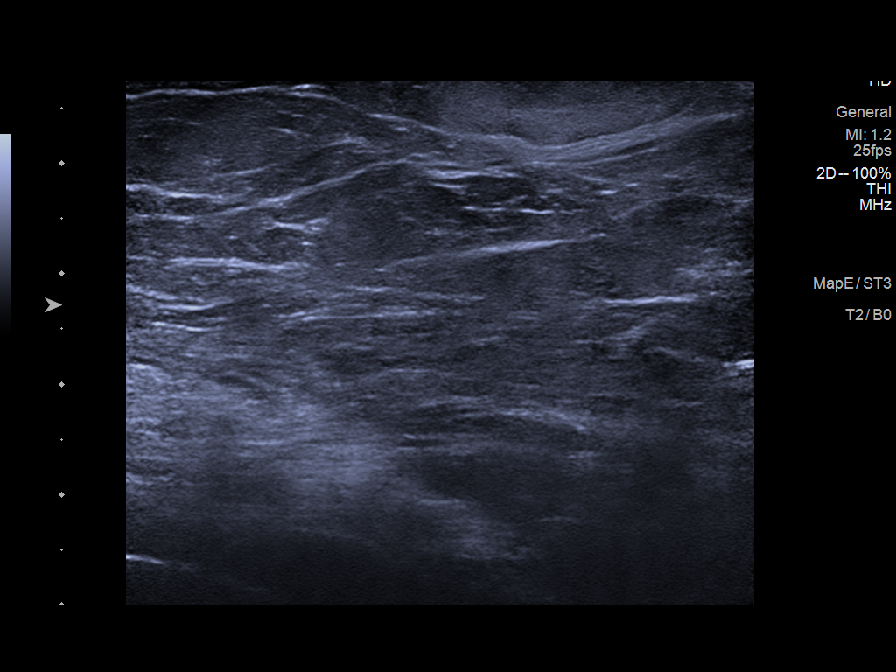
[im 5/8]
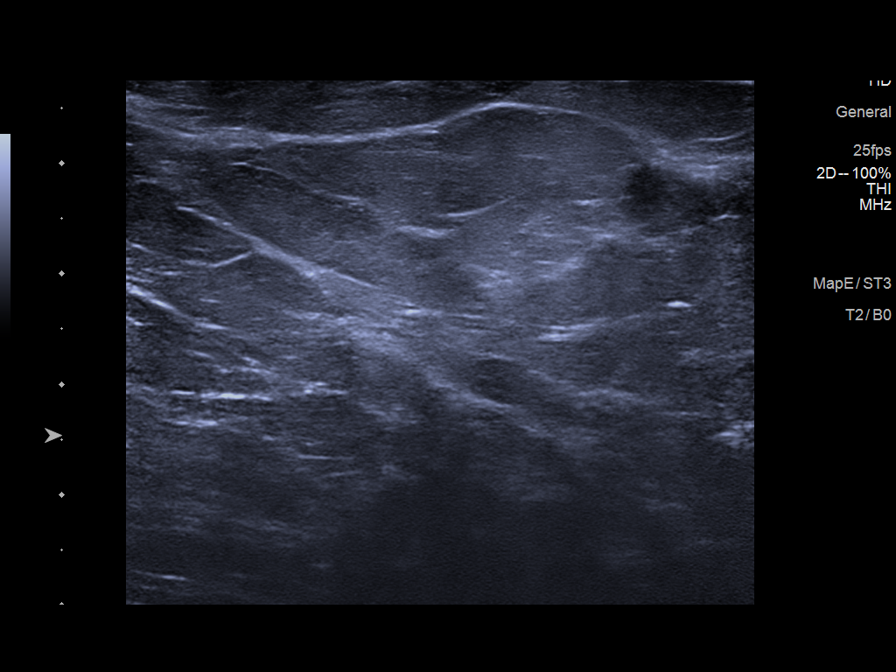
[im 6/8]
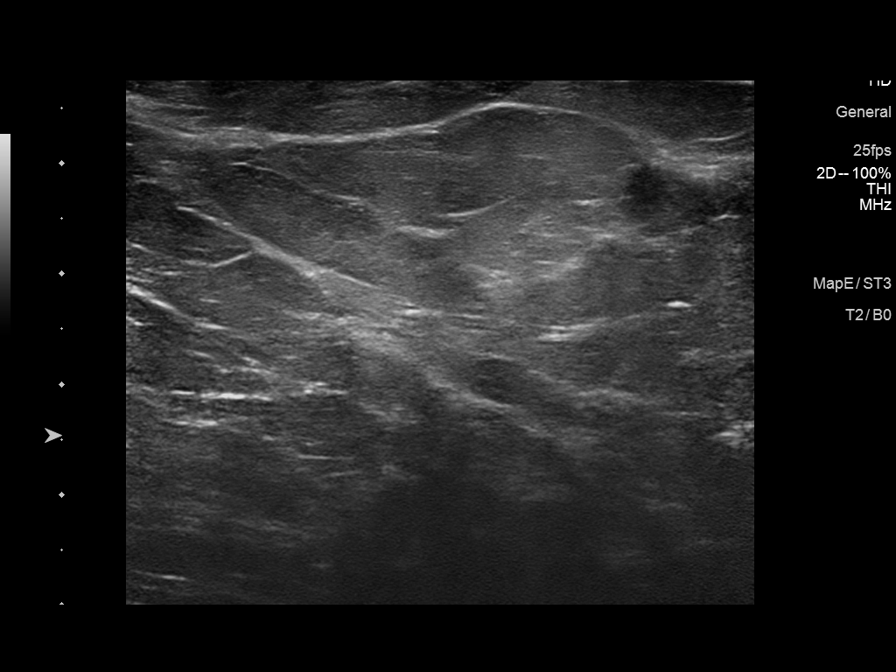
[im 7/8]
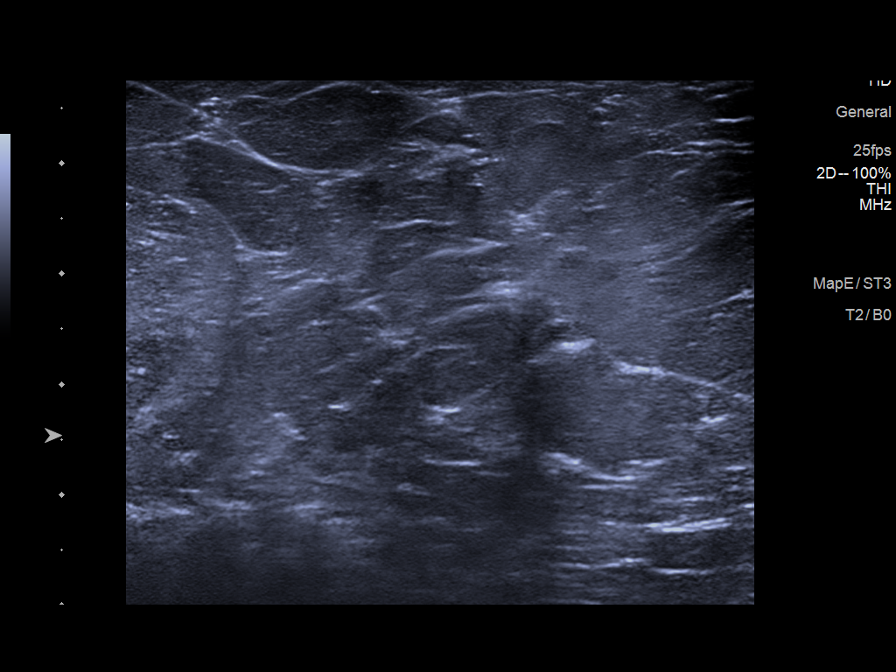
[im 8/8]
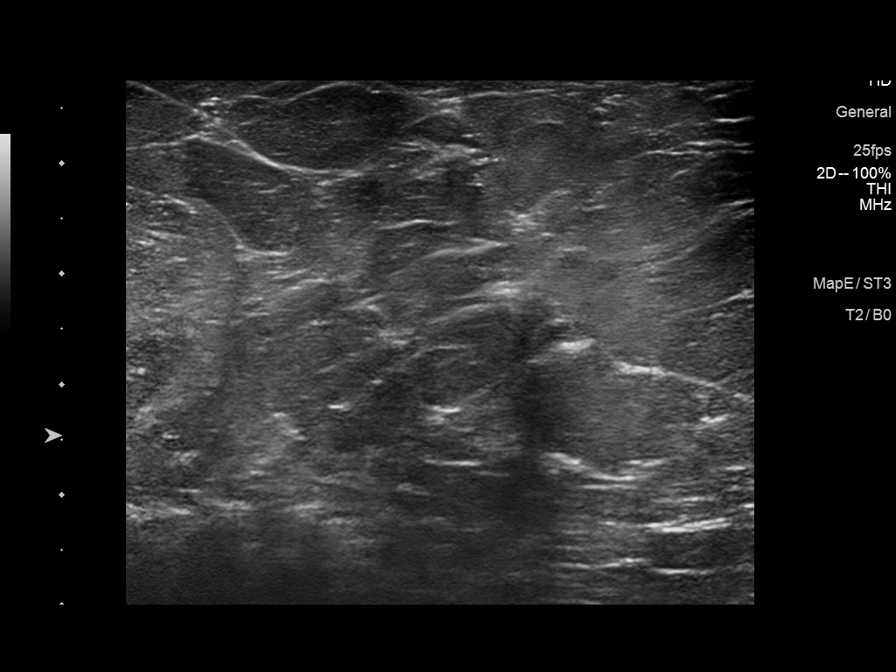

[8 of 8 positions shown; findings below may reference images not displayed]

FINDINGS: There are no masses, areas of architectural distortion, areas of
significant asymmetry or suspicious calcifications. No mammographic
change.

Mammographic images were processed with CAD.

On physical exam, there is a soft nodule in the left breast at
approximately 8 o'clock and a ridge like area of tissue more
inferiorly in the lower inner quadrant.

Targeted ultrasound is performed, showing normal tissue throughout
the entire lower inner quadrant of breast. No mass, cyst or
suspicious lesion.
IMPRESSION: Negative exam.  No evidence of breast malignancy.

RECOMMENDATION:
Screening mammogram in one year.(Code:JP-O-7UH)

I have discussed the findings and recommendations with the patient.
If applicable, a reminder letter will be sent to the patient
regarding the next appointment.

BI-RADS CATEGORY  1: Negative.

## 2020-10-07 IMAGING — MG DIGITAL DIAGNOSTIC BILAT W/ TOMO W/ CAD
8 of 14 series · 8 of 40 positions shown · non-contrast
Comparison: Previous exam(s).

ACR Breast Density Category a: The breast tissue is almost entirely
fatty.

CLINICAL DATA: Patient presents with a palpable lumps in the left
breast, associated with tenderness/pain.

EXAM:
DIGITAL DIAGNOSTIC BILATERAL MAMMOGRAM WITH CAD AND TOMO
ULTRASOUND LEFT BREAST

[L MLO synth-2D]
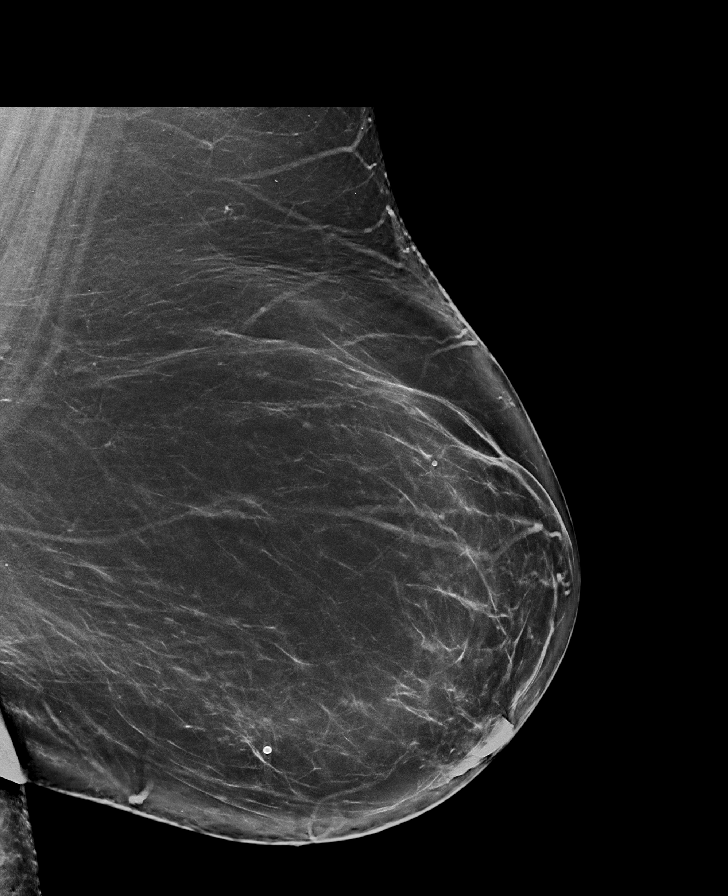

[R CV synth-2D]
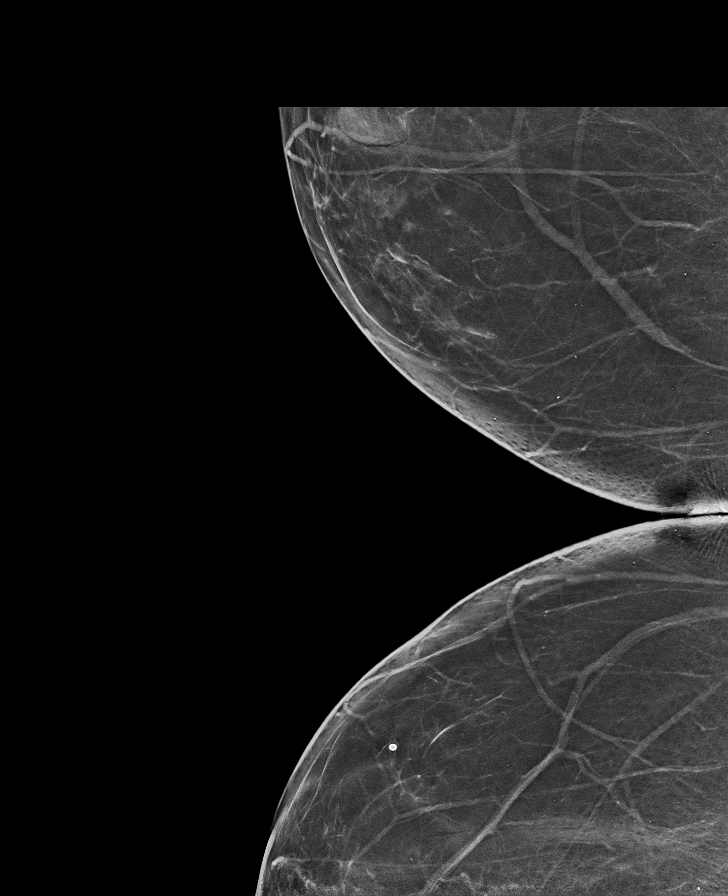

[L TAN synth-2D (1 of 2)]
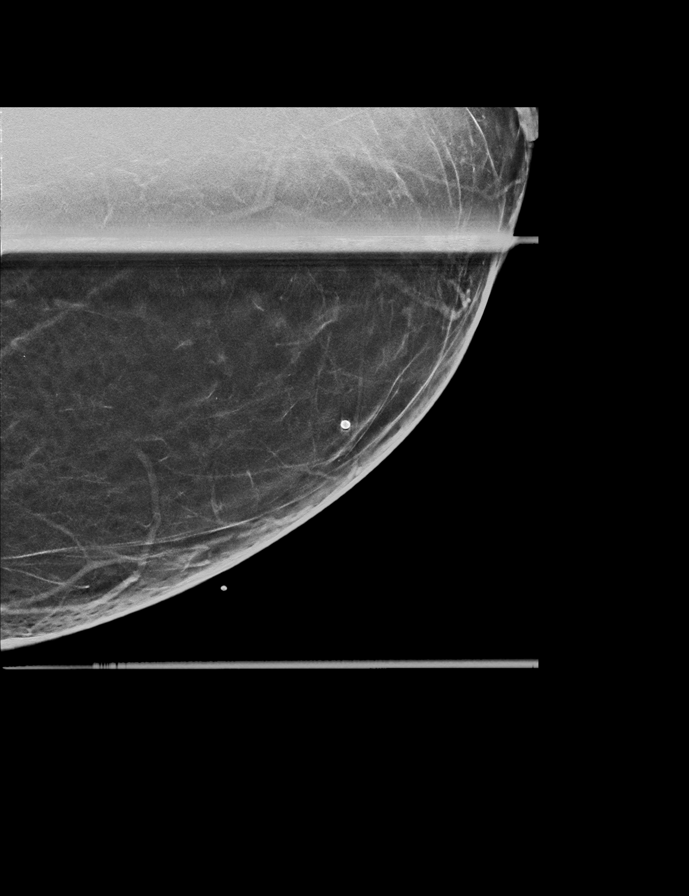

[R MLO synth-2D]
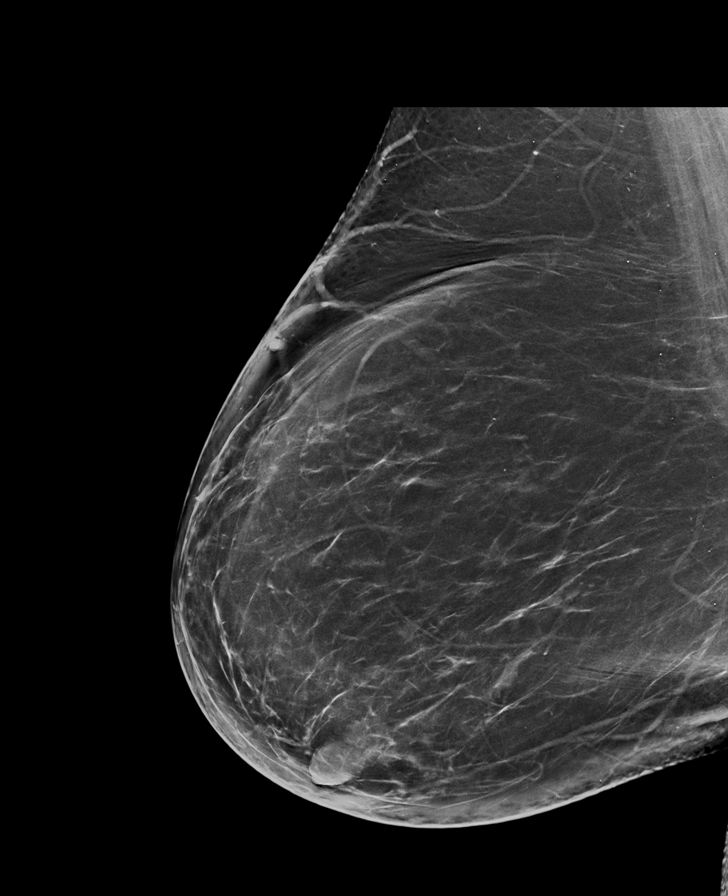

[R CC synth-2D]
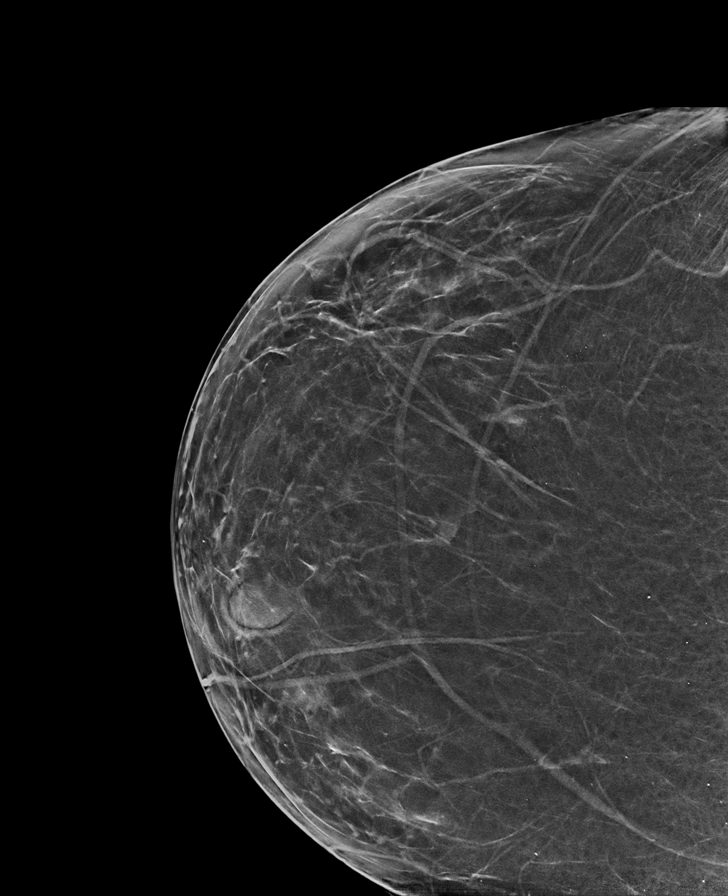

[L TAN synth-2D (2 of 2)]
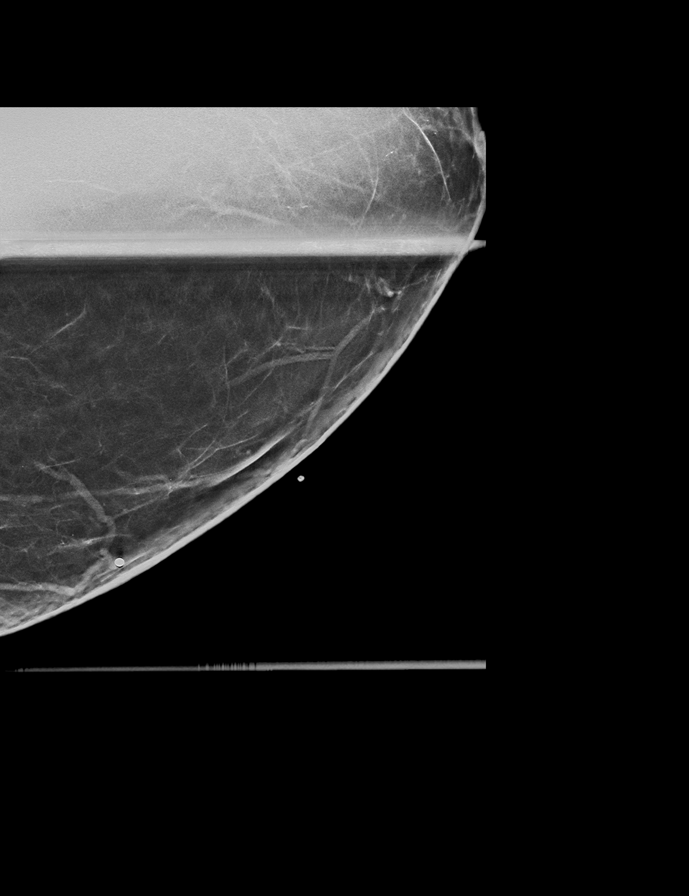

[L CC synth-2D]
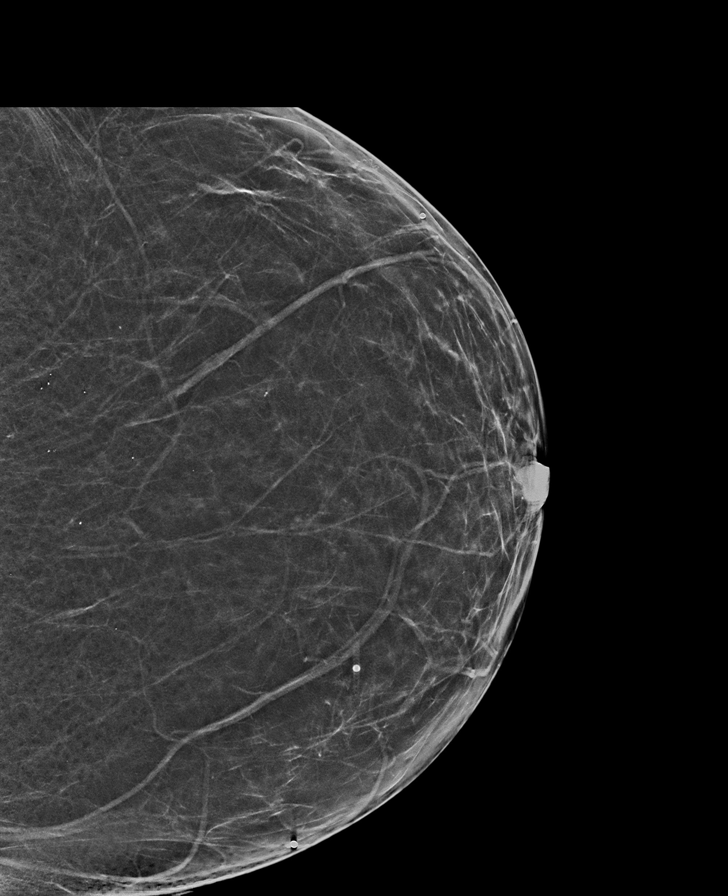

[R CV tomo · tomo slice 39/76.0]
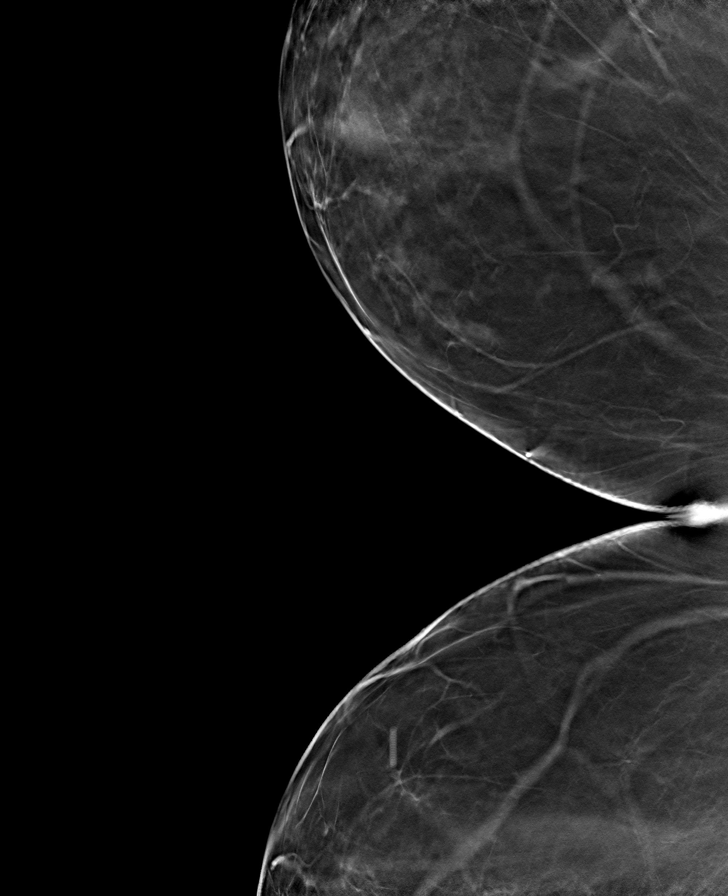

[8 of 40 positions shown; findings below may reference images not displayed]

FINDINGS: There are no masses, areas of architectural distortion, areas of
significant asymmetry or suspicious calcifications. No mammographic
change.

Mammographic images were processed with CAD.

On physical exam, there is a soft nodule in the left breast at
approximately 8 o'clock and a ridge like area of tissue more
inferiorly in the lower inner quadrant.

Targeted ultrasound is performed, showing normal tissue throughout
the entire lower inner quadrant of breast. No mass, cyst or
suspicious lesion.
IMPRESSION: Negative exam.  No evidence of breast malignancy.

RECOMMENDATION:
Screening mammogram in one year.(Code:JP-O-7UH)

I have discussed the findings and recommendations with the patient.
If applicable, a reminder letter will be sent to the patient
regarding the next appointment.

BI-RADS CATEGORY  1: Negative.

## 2020-10-09 ENCOUNTER — Ambulatory Visit (INDEPENDENT_AMBULATORY_CARE_PROVIDER_SITE_OTHER): Payer: BC Managed Care – PPO | Admitting: Psychologist

## 2020-10-09 DIAGNOSIS — F411 Generalized anxiety disorder: Secondary | ICD-10-CM | POA: Diagnosis not present

## 2020-10-09 DIAGNOSIS — F320 Major depressive disorder, single episode, mild: Secondary | ICD-10-CM | POA: Diagnosis not present

## 2020-10-26 ENCOUNTER — Other Ambulatory Visit: Payer: Self-pay | Admitting: Internal Medicine

## 2020-12-27 ENCOUNTER — Other Ambulatory Visit: Payer: Self-pay | Admitting: Internal Medicine

## 2020-12-27 NOTE — Telephone Encounter (Signed)
Pt states " I don't have my next visit until Jan.  Please submit refill so I can get before the holiday.  I went today and they didn't have one on hold".

## 2020-12-28 MED ORDER — HYDROCODONE-ACETAMINOPHEN 5-325 MG PO TABS: 1.0000 | ORAL_TABLET | Freq: Four times a day (QID) | ORAL | 0 refills | Status: DC | PRN

## 2021-01-21 ENCOUNTER — Other Ambulatory Visit: Payer: Self-pay | Admitting: Internal Medicine

## 2021-01-23 DIAGNOSIS — L921 Necrobiosis lipoidica, not elsewhere classified: Secondary | ICD-10-CM | POA: Diagnosis not present

## 2021-01-24 ENCOUNTER — Ambulatory Visit (INDEPENDENT_AMBULATORY_CARE_PROVIDER_SITE_OTHER): Payer: BC Managed Care – PPO | Admitting: Internal Medicine

## 2021-01-24 ENCOUNTER — Other Ambulatory Visit: Payer: Self-pay

## 2021-01-24 ENCOUNTER — Encounter: Payer: Self-pay | Admitting: Internal Medicine

## 2021-01-24 VITALS — BP 150/88 | HR 85 | Temp 99.0°F | Ht 67.0 in | Wt >= 6400 oz

## 2021-01-24 DIAGNOSIS — E559 Vitamin D deficiency, unspecified: Secondary | ICD-10-CM | POA: Diagnosis not present

## 2021-01-24 DIAGNOSIS — F329 Major depressive disorder, single episode, unspecified: Secondary | ICD-10-CM

## 2021-01-24 DIAGNOSIS — G8929 Other chronic pain: Secondary | ICD-10-CM | POA: Diagnosis not present

## 2021-01-24 DIAGNOSIS — F419 Anxiety disorder, unspecified: Secondary | ICD-10-CM | POA: Diagnosis not present

## 2021-01-24 DIAGNOSIS — M5440 Lumbago with sciatica, unspecified side: Secondary | ICD-10-CM | POA: Diagnosis not present

## 2021-01-24 DIAGNOSIS — R739 Hyperglycemia, unspecified: Secondary | ICD-10-CM | POA: Diagnosis not present

## 2021-01-24 DIAGNOSIS — D72828 Other elevated white blood cell count: Secondary | ICD-10-CM

## 2021-01-24 LAB — CBC WITH DIFFERENTIAL/PLATELET
Basophils Absolute: 0.1 10*3/uL (ref 0.0–0.1)
Basophils Relative: 0.4 % (ref 0.0–3.0)
Eosinophils Absolute: 0.1 10*3/uL (ref 0.0–0.7)
Eosinophils Relative: 0.7 % (ref 0.0–5.0)
HCT: 44 % (ref 36.0–46.0)
Hemoglobin: 14.3 g/dL (ref 12.0–15.0)
Lymphocytes Relative: 15.1 % (ref 12.0–46.0)
Lymphs Abs: 2.3 10*3/uL (ref 0.7–4.0)
MCHC: 32.6 g/dL (ref 30.0–36.0)
MCV: 89.3 fl (ref 78.0–100.0)
Monocytes Absolute: 1.1 10*3/uL — ABNORMAL HIGH (ref 0.1–1.0)
Monocytes Relative: 7.1 % (ref 3.0–12.0)
Neutro Abs: 11.6 10*3/uL — ABNORMAL HIGH (ref 1.4–7.7)
Neutrophils Relative %: 76.7 % (ref 43.0–77.0)
Platelets: 319 10*3/uL (ref 150.0–400.0)
RBC: 4.92 Mil/uL (ref 3.87–5.11)
RDW: 14.3 % (ref 11.5–15.5)
WBC: 15.1 10*3/uL — ABNORMAL HIGH (ref 4.0–10.5)

## 2021-01-24 LAB — COMPREHENSIVE METABOLIC PANEL
ALT: 17 U/L (ref 0–35)
AST: 13 U/L (ref 0–37)
Albumin: 4 g/dL (ref 3.5–5.2)
Alkaline Phosphatase: 85 U/L (ref 39–117)
BUN: 11 mg/dL (ref 6–23)
CO2: 31 mEq/L (ref 19–32)
Calcium: 9.3 mg/dL (ref 8.4–10.5)
Chloride: 102 mEq/L (ref 96–112)
Creatinine, Ser: 0.61 mg/dL (ref 0.40–1.20)
GFR: 104.65 mL/min (ref >60.00)
Glucose, Bld: 99 mg/dL (ref 70–99)
Potassium: 4.2 mEq/L (ref 3.5–5.1)
Sodium: 141 mEq/L (ref 135–145)
Total Bilirubin: 0.3 mg/dL (ref 0.2–1.2)
Total Protein: 7.5 g/dL (ref 6.0–8.3)

## 2021-01-24 LAB — URINALYSIS
Bilirubin Urine: NEGATIVE
Hgb urine dipstick: NEGATIVE
Ketones, ur: NEGATIVE
Leukocytes,Ua: NEGATIVE
Nitrite: NEGATIVE
Specific Gravity, Urine: 1.01 (ref 1.000–1.030)
Total Protein, Urine: NEGATIVE
Urine Glucose: NEGATIVE
Urobilinogen, UA: 0.2 (ref 0.0–1.0)
pH: 7 (ref 5.0–8.0)

## 2021-01-24 LAB — LIPID PANEL
Cholesterol: 171 mg/dL (ref 0–200)
HDL: 49.5 mg/dL (ref >39.00)
LDL Cholesterol: 101 mg/dL — ABNORMAL HIGH (ref 0–99)
NonHDL: 121.28
Total CHOL/HDL Ratio: 3
Triglycerides: 102 mg/dL (ref 0.0–149.0)
VLDL: 20.4 mg/dL (ref 0.0–40.0)

## 2021-01-24 LAB — VITAMIN B12: Vitamin B-12: 550 pg/mL (ref 211–911)

## 2021-01-24 LAB — TSH: TSH: 4.34 u[IU]/mL (ref 0.35–5.50)

## 2021-01-24 LAB — HEMOGLOBIN A1C: Hgb A1c MFr Bld: 6.1 % (ref 4.6–6.5)

## 2021-01-24 MED ORDER — ALPRAZOLAM 1 MG PO TABS: ORAL_TABLET | ORAL | 2 refills | Status: DC

## 2021-01-24 MED ORDER — HYDROCODONE-ACETAMINOPHEN 5-325 MG PO TABS: 1.0000 | ORAL_TABLET | Freq: Four times a day (QID) | ORAL | 0 refills | Status: DC | PRN

## 2021-01-24 NOTE — Assessment & Plan Note (Signed)
Cont on Xanax prn Potential benefits of a long term benzodiazepines  use as well as potential risks  and complications were explained to the patient and were aknowledged.

## 2021-01-24 NOTE — Progress Notes (Signed)
Subjective:  Patient ID: Stephanie Harper, female    DOB: 1970/05/06  Age: 51 y.o. MRN: [id number]  CC: Medication Refill   HPI Stephanie Harper presents for LBP, HTN, anxiety C/o stress in the family - estranged nephew moved away  Outpatient Medications Prior to Visit  Medication Sig Dispense Refill   albuterol (VENTOLIN HFA) 108 (90 Base) MCG/ACT inhaler TAKE 2 PUFFS BY MOUTH EVERY 6 HOURS AS NEEDED (Patient taking differently: Inhale 2 puffs into the lungs every 6 (six) hours as needed for wheezing or shortness of breath.) 8 g 2   BREO ELLIPTA 100-25 MCG/INH AEPB INHALE 1 PUFF BY MOUTH EVERY DAY 180 each 3   buPROPion (WELLBUTRIN SR) 100 MG 12 hr tablet Take 1 tablet (100 mg total) by mouth 2 (two) times daily. 180 tablet 2   Cholecalciferol 1000 UNITS tablet Take 2 tablets (2,000 Units total) by mouth daily. 100 tablet 3   escitalopram (LEXAPRO) 20 MG tablet TAKE 1 TABLET BY MOUTH EVERY DAY 90 tablet 1   furosemide (LASIX) 40 MG tablet Take 1 tablet (40 mg total) by mouth daily as needed. (Patient taking differently: Take 40 mg by mouth daily as needed for fluid.) 90 tablet 3   ibuprofen (ADVIL) 600 MG tablet TAKE 1 TABLET (600 MG TOTAL) BY MOUTH 2 (TWO) TIMES DAILY AS NEEDED. 180 tablet 1   Melatonin 10 MG CAPS Take 10 mg by mouth at bedtime as needed (sleep).     methocarbamol (ROBAXIN) 500 MG tablet TAKE 1 TABLET (500 MG TOTAL) BY MOUTH 2 (TWO) TIMES DAILY AS NEEDED FOR MUSCLE SPASMS. 60 tablet 0   Multiple Vitamin (MULTIVITAMIN) tablet Take 1 tablet by mouth 2 (two) times daily.      niacinamide 500 MG tablet Take 500 mg by mouth 2 (two) times daily.      pentoxifylline (TRENTAL) 400 MG CR tablet Take 400 mg by mouth daily.      vitamin B-12 (CYANOCOBALAMIN) 1000 MCG tablet Take 1,000 mcg by mouth daily.     ALPRAZolam (XANAX) 1 MG tablet TAKE 1 TABLET 3 TIMES A DAY AS NEEDED FOR ANXIETY OR SLEEP 90 tablet 2   HYDROcodone-acetaminophen (NORCO/VICODIN) 5-325 MG tablet Take 1  tablet by mouth every 6 (six) hours as needed for severe pain. 120 tablet 0   HYDROcodone-acetaminophen (NORCO/VICODIN) 5-325 MG tablet Take 1 tablet by mouth every 6 (six) hours as needed for moderate pain or severe pain. 120 tablet 0   HYDROcodone-acetaminophen (NORCO/VICODIN) 5-325 MG tablet Take 1 tablet by mouth every 6 (six) hours as needed for severe pain. 120 tablet 0   No facility-administered medications prior to visit.    ROS: Review of Systems  Constitutional:  Negative for activity change, appetite change, chills, fatigue and unexpected weight change.  HENT:  Negative for congestion, mouth sores and sinus pressure.   Eyes:  Negative for visual disturbance.  Respiratory:  Negative for cough and chest tightness.   Gastrointestinal:  Negative for abdominal pain and nausea.  Genitourinary:  Negative for difficulty urinating, frequency and vaginal pain.  Musculoskeletal:  Negative for back pain and gait problem.  Skin:  Negative for pallor and rash.  Neurological:  Negative for dizziness, tremors, weakness, numbness and headaches.  Psychiatric/Behavioral:  Negative for confusion, sleep disturbance and suicidal ideas.    Objective:  BP (!) 150/88 (BP Location: Right Arm, Patient Position: Sitting, Cuff Size: Large)    Pulse 85    Temp 99 F (37.2 C) (Oral)  Ht 5' 7"  (1.702 m)    Wt (!) 400 lb (181.4 kg)    SpO2 98%    BMI 62.65 kg/m   BP Readings from Last 3 Encounters:  01/24/21 (!) 150/88  04/25/20 (!) 148/98  03/13/20 (!) 152/96    Wt Readings from Last 3 Encounters:  01/24/21 (!) 400 lb (181.4 kg)  04/25/20 (!) 390 lb 3.2 oz (177 kg)  02/22/20 (!) 396 lb 12.8 oz (180 kg)    Physical Exam Constitutional:      General: She is not in acute distress.    Appearance: She is well-developed.  HENT:     Head: Normocephalic.     Right Ear: External ear normal.     Left Ear: External ear normal.     Nose: Nose normal.  Eyes:     General:        Right eye: No  discharge.        Left eye: No discharge.     Conjunctiva/sclera: Conjunctivae normal.     Pupils: Pupils are equal, round, and reactive to light.  Neck:     Thyroid: No thyromegaly.     Vascular: No JVD.     Trachea: No tracheal deviation.  Cardiovascular:     Rate and Rhythm: Normal rate and regular rhythm.     Heart sounds: Normal heart sounds.  Pulmonary:     Effort: No respiratory distress.     Breath sounds: No stridor. No wheezing.  Abdominal:     General: Bowel sounds are normal. There is no distension.     Palpations: Abdomen is soft. There is no mass.     Tenderness: There is no abdominal tenderness. There is no guarding or rebound.  Musculoskeletal:        General: No tenderness.     Cervical back: Normal range of motion and neck supple. No rigidity.  Lymphadenopathy:     Cervical: No cervical adenopathy.  Skin:    Findings: No erythema or rash.  Neurological:     Cranial Nerves: No cranial nerve deficit.     Motor: No abnormal muscle tone.     Coordination: Coordination normal.     Gait: Gait normal.     Deep Tendon Reflexes: Reflexes normal.  Psychiatric:        Behavior: Behavior normal.        Thought Content: Thought content normal.        Judgment: Judgment normal.    Lab Results  Component Value Date   WBC 10.7 07/26/2020   HGB 14.7 07/26/2020   HCT 44.0 07/26/2020   PLT 321 07/26/2020   GLUCOSE 97 07/26/2020   CHOL 168 08/03/2018   TRIG 134.0 08/03/2018   HDL 45.00 08/03/2018   LDLCALC 96 08/03/2018   ALT 21 07/26/2020   AST 17 07/26/2020   NA 140 07/26/2020   K 4.3 07/26/2020   CL 103 07/26/2020   CREATININE 0.57 07/26/2020   BUN 10 07/26/2020   CO2 30 07/26/2020   TSH 3.67 07/26/2020   INR 1.0 08/09/2007   HGBA1C 5.7 01/19/2016    No results found.  Assessment & Plan:   Problem List Items Addressed This Visit     Chronic anxiety    Cont on Xanax prn Potential benefits of a long term benzodiazepines  use as well as potential  risks  and complications were explained to the patient and were aknowledged.      Relevant Medications   ALPRAZolam (XANAX) 1  MG tablet   Depression    Situational.      Relevant Medications   ALPRAZolam (XANAX) 1 MG tablet   Other Relevant Orders   CBC with Differential/Platelet   Comprehensive metabolic panel   TSH   Urinalysis   Vitamin B12   Hemoglobin A1c   Lipid panel   Leukocytosis   Relevant Orders   Vitamin B12   LOW BACK PAIN    Cont on Norco prn  Potential benefits of a long term opioids use as well as potential risks (i.e. addiction risk, apnea etc) and complications (i.e. Somnolence, constipation and others) were explained to the patient and were aknowledged.      Relevant Medications   HYDROcodone-acetaminophen (NORCO/VICODIN) 5-325 MG tablet   HYDROcodone-acetaminophen (NORCO/VICODIN) 5-325 MG tablet   HYDROcodone-acetaminophen (NORCO/VICODIN) 5-325 MG tablet   Vitamin D deficiency    On Vit D      Other Visit Diagnoses     Hyperglycemia    -  Primary   Relevant Orders   CBC with Differential/Platelet   Comprehensive metabolic panel   TSH   Urinalysis   Vitamin B12   Hemoglobin A1c   Lipid panel         Meds ordered this encounter  Medications   ALPRAZolam (XANAX) 1 MG tablet    Sig: TAKE 1 TABLET 3 TIMES A DAY AS NEEDED FOR ANXIETY OR SLEEP    Dispense:  90 tablet    Refill:  2    This request is for a new prescription for a controlled substance as required by Federal/State law.   HYDROcodone-acetaminophen (NORCO/VICODIN) 5-325 MG tablet    Sig: Take 1 tablet by mouth every 6 (six) hours as needed for severe pain.    Dispense:  120 tablet    Refill:  0    Please fill on or after 01/27/21   HYDROcodone-acetaminophen (NORCO/VICODIN) 5-325 MG tablet    Sig: Take 1 tablet by mouth every 6 (six) hours as needed for severe pain.    Dispense:  120 tablet    Refill:  0    Please fill on or after 02/26/21   HYDROcodone-acetaminophen  (NORCO/VICODIN) 5-325 MG tablet    Sig: Take 1 tablet by mouth every 6 (six) hours as needed for moderate pain or severe pain.    Dispense:  120 tablet    Refill:  0    Please fill on or after 03/28/21      Follow-up: Return in about 3 months (around 04/24/2021) for a follow-up visit.  Walker Kehr, MD

## 2021-01-24 NOTE — Assessment & Plan Note (Signed)
On Vit D 

## 2021-01-24 NOTE — Assessment & Plan Note (Signed)
Situational.

## 2021-01-24 NOTE — Assessment & Plan Note (Signed)
Cont on Norco prn  Potential benefits of a long term opioids use as well as potential risks (i.e. addiction risk, apnea etc) and complications (i.e. Somnolence, constipation and others) were explained to the patient and were aknowledged.

## 2021-01-27 ENCOUNTER — Encounter: Payer: Self-pay | Admitting: Internal Medicine

## 2021-01-30 ENCOUNTER — Other Ambulatory Visit: Payer: Self-pay | Admitting: Internal Medicine

## 2021-01-30 ENCOUNTER — Encounter: Payer: Self-pay | Admitting: Internal Medicine

## 2021-01-30 MED ORDER — HYDROCODONE-ACETAMINOPHEN 5-325 MG PO TABS: 1.0000 | ORAL_TABLET | Freq: Four times a day (QID) | ORAL | 0 refills | Status: DC | PRN

## 2021-02-05 ENCOUNTER — Other Ambulatory Visit: Payer: Self-pay | Admitting: Internal Medicine

## 2021-02-05 DIAGNOSIS — D72829 Elevated white blood cell count, unspecified: Secondary | ICD-10-CM

## 2021-02-14 ENCOUNTER — Telehealth: Payer: Self-pay | Admitting: Internal Medicine

## 2021-02-14 NOTE — Telephone Encounter (Signed)
Copied from [place] [phone]. Topic: Medicare AWV >> Feb 14, 2021  3:33 PM Kay[place], [place] wrote: Reason for CRM:  Left message for patient to call back to schedule Medicare Annual Wellness Visit   Last AWV  02/22/20  Please schedule at anytime with [facility] if patient calls the office back.    40 Minutes appointment   Any questions, please call me at [phone]

## 2021-02-21 ENCOUNTER — Ambulatory Visit (INDEPENDENT_AMBULATORY_CARE_PROVIDER_SITE_OTHER): Payer: BC Managed Care – PPO

## 2021-02-21 VITALS — Wt >= 6400 oz

## 2021-02-21 DIAGNOSIS — Z0000 Encounter for general adult medical examination without abnormal findings: Secondary | ICD-10-CM

## 2021-02-21 NOTE — Progress Notes (Addendum)
Subjective:   Stephanie Harper is a 51 y.o. female who presents for Medicare Annual (Subsequent) preventive examination.  Virtual Visit via Telephone Note  I connected with  Stephanie Harper on 02/21/21 at  3:40 PM EST by telephone and verified that I am speaking with the correct person using two identifiers.  Location: Patient: Home Provider: [place] Persons participating in the virtual visit: patient/Nurse Health Advisor   I discussed the limitations, risks, security and privacy concerns of performing an evaluation and management service by telephone and the availability of in person appointments. The patient expressed understanding and agreed to proceed.  Interactive audio and video telecommunications were attempted between this nurse and patient, however failed, due to patient having technical difficulties OR patient did not have access to video capability.  We continued and completed visit with audio only.  Some vital signs may be absent or patient reported.   Stephanie Harper E Stephanie Wilms, LPN   Review of Systems     Cardiac Risk Factors include: family history of premature cardiovascular disease;sedentary lifestyle;obesity (BMI >30kg/m2);hypertension;smoking/ tobacco exposure     Objective:    Today's Vitals   02/21/21 1609  Weight: (!) 400 lb (181.4 kg)  PainSc: 3    Body mass index is 62.65 kg/m.  Advanced Directives 02/21/2021 02/22/2020 07/27/2019 08/20/2016 06/14/2016 01/10/2012  Does Patient Have a Medical Advance Directive? No No No No No Patient does not have advance directive;Patient would not like information  Does patient want to make changes to medical advance directive? - - No - Patient declined - - -  Would patient like information on creating a medical advance directive? No - Patient declined No - Patient declined - No - Patient declined Yes (ED - Information included in AVS) -  Some encounter information is confidential and restricted. Go to Review Flowsheets  activity to see all data.    Current Medications (verified) Outpatient Encounter Medications as of 02/21/2021  Medication Sig   albuterol (VENTOLIN HFA) 108 (90 Base) MCG/ACT inhaler TAKE 2 PUFFS BY MOUTH EVERY 6 HOURS AS NEEDED (Patient taking differently: Inhale 2 puffs into the lungs every 6 (six) hours as needed for wheezing or shortness of breath.)   ALPRAZolam (XANAX) 1 MG tablet TAKE 1 TABLET 3 TIMES A DAY AS NEEDED FOR ANXIETY OR SLEEP   BREO ELLIPTA 100-25 MCG/INH AEPB INHALE 1 PUFF BY MOUTH EVERY DAY   buPROPion (WELLBUTRIN SR) 100 MG 12 hr tablet Take 1 tablet (100 mg total) by mouth 2 (two) times daily.   Cholecalciferol 1000 UNITS tablet Take 2 tablets (2,000 Units total) by mouth daily.   escitalopram (LEXAPRO) 20 MG tablet TAKE 1 TABLET BY MOUTH EVERY DAY   furosemide (LASIX) 40 MG tablet Take 1 tablet (40 mg total) by mouth daily as needed. (Patient taking differently: Take 40 mg by mouth daily as needed for fluid.)   HYDROcodone-acetaminophen (NORCO/VICODIN) 5-325 MG tablet Take 1 tablet by mouth every 6 (six) hours as needed for severe pain.   HYDROcodone-acetaminophen (NORCO/VICODIN) 5-325 MG tablet Take 1 tablet by mouth every 6 (six) hours as needed for moderate pain or severe pain.   HYDROcodone-acetaminophen (NORCO/VICODIN) 5-325 MG tablet Take 1 tablet by mouth every 6 (six) hours as needed for severe pain.   ibuprofen (ADVIL) 600 MG tablet TAKE 1 TABLET (600 MG TOTAL) BY MOUTH 2 (TWO) TIMES DAILY AS NEEDED.   Melatonin 10 MG CAPS Take 10 mg by mouth at bedtime as needed (sleep).   methocarbamol (  ROBAXIN) 500 MG tablet TAKE 1 TABLET (500 MG TOTAL) BY MOUTH 2 (TWO) TIMES DAILY AS NEEDED FOR MUSCLE SPASMS.   Multiple Vitamin (MULTIVITAMIN) tablet Take 1 tablet by mouth 2 (two) times daily.    mupirocin ointment (BACTROBAN) 2 % Apply topically.   niacinamide 500 MG tablet Take 500 mg by mouth 2 (two) times daily.    pentoxifylline (TRENTAL) 400 MG CR tablet Take 400 mg by  mouth daily.    vitamin B-12 (CYANOCOBALAMIN) 1000 MCG tablet Take 1,000 mcg by mouth daily.   No facility-administered encounter medications on file as of 02/21/2021.    Allergies (verified) Adhesive [tape]   History: Past Medical History:  Diagnosis Date   Anal fissure 2012   Dr Ardis Hughs   Anxiety    Arthritis    Asthma    COLD WEATHER RELATED, bronchitis   Baker's cyst of knee    Right knee   Complication of anesthesia    PANIC ATTACK RIGHT BEFORE SHOULDER REPLACMENT SURGERY AT [facility]; PT STATES CLAUSTROPHOBIA AND DOES NOT WANT TO BE AWARE OF BEING STRAPPED DOWN IN OR   Depression    HTN (hypertension)    after having lap band surgery she was taken off medications   LBP (low back pain)    Obesity    Osteomyelitis ([place])    at [facility]   Pain    HX OF BILATERAL SHOULDER [place] ;  HX OF BILATERAL KNEE PAIN   Sepsis(995.91) 2005   From shoulder replacement surgery   Shoulder fracture, left    Past Surgical History:  Procedure Laterality Date   APPENDECTOMY     BREATH TEK H PYLORI  07/15/2011   Procedure: BREATH TEK H PYLORI;  Surgeon: Pedro Earls, MD;  Location: [facility];  Service: General;  Laterality: N/A;   CHOLECYSTECTOMY     LAPAROSCOPIC GASTRIC BANDING  01/21/2012   Procedure: LAPAROSCOPIC GASTRIC BANDING;  Surgeon: Pedro Earls, MD;  Location: [facility];  Service: General;  Laterality: N/A;   LASER ABLATION     MESH APPLIED TO LAP PORT  01/21/2012   Procedure: MESH APPLIED TO LAP PORT;  Surgeon: Pedro Earls, MD;  Location: [facility];  Service: General;;   [place] ARTHROSCOPY Right 07/27/2019   Procedure: RIGHT SHOUDLER ARTHROSCOPY, TISSUE SAMPLE;  Surgeon: Meredith Pel, MD;  Location: [facility];  Service: Orthopedics;  Laterality: Right;   TONSILLECTOMY     TOTAL SHOULDER REPLACEMENT     Right   TUBAL LIGATION     UTERINE ABLATION WAS DONE AT THE TIME OF TUBAL LIGATION   Family History  Problem  Relation Age of Onset   Arthritis Mother    Diabetes Mother    Hyperlipidemia Mother    Hypertension Mother    Arthritis Father    Diabetes Father    Hyperlipidemia Father    Hypertension Father    Heart disease Father    Coronary artery disease Other    Hyperlipidemia Other    Hypertension Other    Bipolar disorder Brother    Anxiety disorder Neg Hx    Depression Neg Hx    Dementia Neg Hx    Alcohol abuse Neg Hx    Drug abuse Neg Hx    Schizophrenia Neg Hx    Social History   Socioeconomic History   Marital status: Married    Spouse name: Not on file   Number of children: Not on file  Years of education: Not on file   Highest education level: Not on file  Occupational History   Occupation: Housewife    Employer: UNEMPLOYED  Tobacco Use   Smoking status: Every Day    Packs/day: 1.00    Years: 31.00    Pack years: 31.00    Types: Cigarettes    Start date: 08/12/1980   Smokeless tobacco: Never   Tobacco comments:    WAS SMOKING 2 PPD-HAS CUT BACK TO 1 PPD OR LESS  Vaping Use   Vaping Use: Never used  Substance and Sexual Activity   Alcohol use: No   Drug use: No   Sexual activity: Yes  Other Topics Concern   Not on file  Social History Narrative   Not on file   Social Determinants of Health   Financial Resource Strain: Low Risk    Difficulty of Paying Living Expenses: Not hard at all  Food Insecurity: No Food Insecurity   Worried About Charity fundraiser in the Last Year: Never true   [place] in the Last Year: Never true  Transportation Needs: No Transportation Needs   Lack of Transportation (Medical): No   Lack of Transportation (Non-Medical): No  Physical Activity: Insufficiently Active   Days of Exercise per Week: 7 days   Minutes of Exercise per Session: 20 min  Stress: No Stress Concern Present   Feeling of Stress : Only a little  Social Connections: Moderately Integrated   Frequency of Communication with Friends and Family: More than  three times a week   Frequency of Social Gatherings with Friends and Family: Twice a week   Attends Religious Services: Never   Marine scientist or Organizations: Yes   Attends Music therapist: More than 4 times per year   Marital Status: Married    Tobacco Counseling Ready to quit: Not Answered Counseling given: Not Answered Tobacco comments: WAS SMOKING 2 PPD-HAS CUT BACK TO 1 PPD OR LESS   Clinical Intake:  Pre-visit preparation completed: Yes  Pain : 0-10 Pain Score: 3  Pain Type: Chronic pain Pain Location: Knee Pain Orientation: Right, Left Pain Descriptors / Indicators: Aching, Discomfort, Tender, Sore Pain Onset: More than a month ago Pain Frequency: Intermittent     BMI - recorded: 62.65 Nutritional Status: BMI > 30  Obese Nutritional Risks: None Diabetes: No  How often do you need to have someone help you when you read instructions, pamphlets, or other written materials from your doctor or pharmacy?: 1 - Never  Diabetic? no  Interpreter Needed?: No  Information entered by :: Dalinda Heidt, LPN   Activities of Daily Living In your present state of health, do you have any difficulty performing the following activities: 02/21/2021 02/22/2020  Hearing? N N  Vision? N N  Difficulty concentrating or making decisions? N N  Walking or climbing stairs? Y N  Dressing or bathing? N N  Doing errands, shopping? N N  Preparing Food and eating ? N N  Using the Toilet? N N  In the past six months, have you accidently leaked urine? Y N  Comment rare -  Do you have problems with loss of bowel control? N N  Managing your Medications? N N  Managing your Finances? N N  Housekeeping or managing your Housekeeping? N N  Some recent data might be hidden    Patient Care Team: Plotnikov, Evie Lacks, MD as PCP - General Dean, Tonna Corner, MD (Orthopedic Surgery) Owens Loffler  Mamie Nick, MD (Gastroenterology) Charlcie Cradle, MD (Psychiatry) Rolm Bookbinder, MD  as Consulting Physician (Dermatology) Delsa Bern, MD as Consulting Physician (Obstetrics and Gynecology)  Indicate any recent Medical Services you may have received from other than [facility] providers in the past year (date may be approximate).     Assessment:   This is a routine wellness examination for Alanys.  Hearing/Vision screen Hearing Screening - Comments:: Denies hearing difficulties   Vision Screening - Comments:: Wears rx glasses - up to date with routine eye exams with [facility]  Dietary issues and exercise activities discussed: Current Exercise Habits: Home exercise routine, Type of exercise: walking;stretching, Time (Minutes): 20, Frequency (Times/Week): 7, Weekly Exercise (Minutes/Week): 140, Intensity: Mild, Exercise limited by: orthopedic condition(s);respiratory conditions(s)   Goals Addressed             This Visit's Progress    lose weight so can I play in the dirt and plant flowers   Not on track    Go to see doctor Hassell Done to start the lap band process, be more active, when we get the pool filled I will start walking laps.         Depression Screen PHQ 2/9 Scores 02/21/2021 01/24/2021 04/25/2020 02/22/2020 07/20/2019 06/14/2016 04/23/2016  PHQ - 2 Score 3 3 0 0 2 3 4   PHQ- 9 Score 5 5 3  - 3 11 12     Fall Risk Fall Risk  02/21/2021 04/25/2020 02/22/2020 08/20/2016 06/14/2016  Falls in the past year? 1 1 0 No No  Number falls in past yr: 0 0 0 - -  Injury with Fall? 1 0 0 - -  Risk for fall due to : Orthopedic patient;Impaired balance/gait;Medication side effect;History of fall(s) Other (Comment) No Fall Risks - -  Risk for fall due to: Comment - on ice - - -  Follow up Falls prevention discussed;Education provided Falls evaluation completed - - -    FALL RISK PREVENTION PERTAINING TO THE HOME:  Any stairs in or around the home? No  If so, are there any without handrails? No  Home free of loose throw rugs in walkways, pet beds, electrical cords, etc? Yes   Adequate lighting in your home to reduce risk of falls? Yes   ASSISTIVE DEVICES UTILIZED TO PREVENT FALLS:  Life alert? No  Use of a cane, walker or w/c? No  Grab bars in the bathroom? Yes  Shower chair or bench in shower? No  Elevated toilet seat or a handicapped toilet? No   TIMED UP AND GO:  Was the test performed? No . Telephonic visit  Cognitive Function:     6CIT Screen 02/21/2021  What Year? 0 points  What month? 0 points  What time? 0 points  Count back from 20 0 points  Months in reverse 0 points  Repeat phrase 0 points  Total Score 0    Immunizations Immunization History  Administered Date(s) Administered   Influenza Split 12/07/2010, 10/04/2011   Influenza Whole 11/04/2007   Influenza,inj,Quad PF,6+ Mos 09/17/2012, 10/22/2013, 10/14/2014, 10/18/2015, 10/18/2016, 10/13/2017, 09/07/2018, 10/29/2019   PPD Test 06/05/2010   Tdap 11/01/2011    TDAP status: Up to date  Flu Vaccine status: Due, Education has been provided regarding the importance of this vaccine. Advised may receive this vaccine at local pharmacy or Health Dept. Aware to provide a copy of the vaccination record if obtained from local pharmacy or Health Dept. Verbalized acceptance and understanding.   Covid-19 vaccine status: Declined, Education has been provided regarding  the importance of this vaccine but patient still declined. Advised may receive this vaccine at local pharmacy or Health Dept.or vaccine clinic. Aware to provide a copy of the vaccination record if obtained from local pharmacy or Health Dept. Verbalized acceptance and understanding.  Qualifies for Shingles Vaccine? No   Zostavax completed No     Screening Tests Health Maintenance  Topic Date Due   COVID-19 Vaccine (1) Never done   HIV Screening  Never done   Hepatitis C Screening  Never done   PAP SMEAR-Modifier  01/17/2020   COLONOSCOPY (Pts 45-32yr Insurance coverage will need to be confirmed)  03/21/2020   INFLUENZA  VACCINE  08/07/2020   TETANUS/TDAP  10/31/2021   HPV VACCINES  Aged Out    Health Maintenance  Health Maintenance Due  Topic Date Due   COVID-19 Vaccine (1) Never done   HIV Screening  Never done   Hepatitis C Screening  Never done   PAP SMEAR-Modifier  01/17/2020   COLONOSCOPY (Pts 45-410yrInsurance coverage will need to be confirmed)  03/21/2020   INFLUENZA VACCINE  08/07/2020    Colonoscopy due at age [age]Mammogram status: Ordered 02/21/2021. Pt provided with contact info and advised to call to schedule appt.    Lung Cancer Screening: (Low Dose CT Chest recommended if Age 50-80ears, 30 pack-year currently smoking OR have quit w/in 15years.) does not qualify  Additional Screening:  Hepatitis C Screening: does not qualify  Vision Screening: Recommended annual ophthalmology exams for early detection of glaucoma and other disorders of the eye. Is the patient up to date with their annual eye exam?  Yes  Who is the provider or what is the name of the office in which the patient attends annual eye exams? [facility] If pt is not established with a provider, would they like to be referred to a provider to establish care? No .   Dental Screening: Recommended annual dental exams for proper oral hygiene  Community Resource Referral / Chronic Care Management: CRR required this visit?  No   CCM required this visit?  No      Plan:     I have personally reviewed and noted the following in the patients chart:   Medical and social history Use of alcohol, tobacco or illicit drugs  Current medications and supplements including opioid prescriptions.  Functional ability and status Nutritional status Physical activity Advanced directives List of other physicians Hospitalizations, surgeries, and ER visits in previous 12 months Vitals Screenings to include cognitive, depression, and falls Referrals and appointments  In addition, I have reviewed and discussed with patient  certain preventive protocols, quality metrics, and best practice recommendations. A written personalized care plan for preventive services as well as general preventive health recommendations were provided to patient.     AmSandrea HammondLPN   02/14/45/8412 Nurse Notes: None  Medical screening examination/treatment/procedure(s) were performed by non-physician practitioner and as supervising physician I was immediately available for consultation/collaboration.  I agree with above. AlLew DawesMD

## 2021-02-21 NOTE — Patient Instructions (Signed)
[name] , Thank you for taking time to come for your Medicare Wellness Visit. I appreciate your ongoing commitment to your health goals. Please review the following plan we discussed and let me know if I can assist you in the future.   Screening recommendations/referrals: Colonoscopy: Due at age 51 Mammogram: Done 11/24/2018 - Repeat annually *past due - call Dr Cletis Media for appointment soon Bone Density: Due at age 40 Recommended yearly ophthalmology/optometry visit for glaucoma screening and checkup Recommended yearly dental visit for hygiene and checkup  Vaccinations: Influenza vaccine: Due - recommend every fall Pneumococcal vaccine: Due at age 56 Tdap vaccine: Done 11/01/2011 - Repeat in 10 years *this year Shingles vaccine: Due at age 60  Covid-19: Declined  Advanced directives: Please bring a copy of your health care power of attorney and living will to the office to be added to your chart at your convenience.   Conditions/risks identified: Aim for 30 minutes of exercise or brisk walking each day, drink 6-8 glasses of water and eat lots of fruits and vegetables.   Next appointment: Follow up in one year for your annual wellness visit.   Preventive Care 40-64 Years, Female Preventive care refers to lifestyle choices and visits with your health care provider that can promote health and wellness. What does preventive care include? A yearly physical exam. This is also called an annual well check. Dental exams once or twice a year. Routine eye exams. Ask your health care provider how often you should have your eyes checked. Personal lifestyle choices, including: Daily care of your teeth and gums. Regular physical activity. Eating a healthy diet. Avoiding tobacco and drug use. Limiting alcohol use. Practicing safe sex. Taking low-dose aspirin daily starting at age 46. Taking vitamin and mineral supplements as recommended by your health care provider. What happens during an  annual well check? The services and screenings done by your health care provider during your annual well check will depend on your age, overall health, lifestyle risk factors, and family history of disease. Counseling  Your health care provider may ask you questions about your: Alcohol use. Tobacco use. Drug use. Emotional well-being. Home and relationship well-being. Sexual activity. Eating habits. Work and work Statistician. Method of birth control. Menstrual cycle. Pregnancy history. Screening  You may have the following tests or measurements: Height, weight, and BMI. Blood pressure. Lipid and cholesterol levels. These may be checked every 5 years, or more frequently if you are over 52 years old. Skin check. Lung cancer screening. You may have this screening every year starting at age 53 if you have a 30-pack-year history of smoking and currently smoke or have quit within the past 15 years. Fecal occult blood test (FOBT) of the stool. You may have this test every year starting at age 22. Flexible sigmoidoscopy or colonoscopy. You may have a sigmoidoscopy every 5 years or a colonoscopy every 10 years starting at age 44. Hepatitis C blood test. Hepatitis B blood test. Sexually transmitted disease (STD) testing. Diabetes screening. This is done by checking your blood sugar (glucose) after you have not eaten for a while (fasting). You may have this done every 1-3 years. Mammogram. This may be done every 1-2 years. Talk to your health care provider about when you should start having regular mammograms. This may depend on whether you have a family history of breast cancer. BRCA-related cancer screening. This may be done if you have a family history of breast, ovarian, tubal, or peritoneal cancers. Pelvic exam and  Pap test. This may be done every 3 years starting at age 61. Starting at age 61, this may be done every 5 years if you have a Pap test in combination with an HPV test. Bone  density scan. This is done to screen for osteoporosis. You may have this scan if you are at high risk for osteoporosis. Discuss your test results, treatment options, and if necessary, the need for more tests with your health care provider. Vaccines  Your health care provider may recommend certain vaccines, such as: Influenza vaccine. This is recommended every year. Tetanus, diphtheria, and acellular pertussis (Tdap, Td) vaccine. You may need a Td booster every 10 years. Zoster vaccine. You may need this after age 31. Pneumococcal 13-valent conjugate (PCV13) vaccine. You may need this if you have certain conditions and were not previously vaccinated. Pneumococcal polysaccharide (PPSV23) vaccine. You may need one or two doses if you smoke cigarettes or if you have certain conditions. Talk to your health care provider about which screenings and vaccines you need and how often you need them. This information is not intended to replace advice given to you by your health care provider. Make sure you discuss any questions you have with your health care provider. Document Released: 01/20/2015 Document Revised: 09/13/2015 Document Reviewed: 10/25/2014 Elsevier Interactive Patient Education  2017 [place] Prevention in the Home Falls can cause injuries. They can happen to people of all ages. There are many things you can do to make your home safe and to help prevent falls. What can I do on the outside of my home? Regularly fix the edges of walkways and driveways and fix any cracks. Remove anything that might make you trip as you walk through a door, such as a raised step or threshold. Trim any bushes or trees on the path to your home. Use bright outdoor lighting. Clear any walking paths of anything that might make someone trip, such as rocks or tools. Regularly check to see if handrails are loose or broken. Make sure that both sides of any steps have handrails. Any raised decks and  porches should have guardrails on the edges. Have any leaves, snow, or ice cleared regularly. Use sand or salt on walking paths during winter. Clean up any spills in your garage right away. This includes oil or grease spills. What can I do in the bathroom? Use night lights. Install grab bars by the toilet and in the tub and shower. Do not use towel bars as grab bars. Use non-skid mats or decals in the tub or shower. If you need to sit down in the shower, use a plastic, non-slip stool. Keep the floor dry. Clean up any water that spills on the floor as soon as it happens. Remove soap buildup in the tub or shower regularly. Attach bath mats securely with double-sided non-slip rug tape. Do not have throw rugs and other things on the floor that can make you trip. What can I do in the bedroom? Use night lights. Make sure that you have a light by your bed that is easy to reach. Do not use any sheets or blankets that are too big for your bed. They should not hang down onto the floor. Have a firm chair that has side arms. You can use this for support while you get dressed. Do not have throw rugs and other things on the floor that can make you trip. What can I do in the kitchen? Clean up any  spills right away. Avoid walking on wet floors. Keep items that you use a lot in easy-to-reach places. If you need to reach something above you, use a strong step stool that has a grab bar. Keep electrical cords out of the way. Do not use floor polish or wax that makes floors slippery. If you must use wax, use non-skid floor wax. Do not have throw rugs and other things on the floor that can make you trip. What can I do with my stairs? Do not leave any items on the stairs. Make sure that there are handrails on both sides of the stairs and use them. Fix handrails that are broken or loose. Make sure that handrails are as long as the stairways. Check any carpeting to make sure that it is firmly attached to the  stairs. Fix any carpet that is loose or worn. Avoid having throw rugs at the top or bottom of the stairs. If you do have throw rugs, attach them to the floor with carpet tape. Make sure that you have a light switch at the top of the stairs and the bottom of the stairs. If you do not have them, ask someone to add them for you. What else can I do to help prevent falls? Wear shoes that: Do not have high heels. Have rubber bottoms. Are comfortable and fit you well. Are closed at the toe. Do not wear sandals. If you use a stepladder: Make sure that it is fully opened. Do not climb a closed stepladder. Make sure that both sides of the stepladder are locked into place. Ask someone to hold it for you, if possible. Clearly mark and make sure that you can see: Any grab bars or handrails. First and last steps. Where the edge of each step is. Use tools that help you move around (mobility aids) if they are needed. These include: Canes. Walkers. Scooters. Crutches. Turn on the lights when you go into a dark area. Replace any light bulbs as soon as they burn out. Set up your furniture so you have a clear path. Avoid moving your furniture around. If any of your floors are uneven, fix them. If there are any pets around you, be aware of where they are. Review your medicines with your doctor. Some medicines can make you feel dizzy. This can increase your chance of falling. Ask your doctor what other things that you can do to help prevent falls. This information is not intended to replace advice given to you by your health care provider. Make sure you discuss any questions you have with your health care provider. Document Released: 10/20/2008 Document Revised: 06/01/2015 Document Reviewed: 01/28/2014 Elsevier Interactive Patient Education  2017 [employer].

## 2021-02-28 IMAGING — MR MR SHOULDER*R* W/O CM
5 of 6 series · 28 of 40 positions shown · non-contrast
Comparison: X-ray 03/19/2019

CLINICAL DATA: Right shoulder pain. History of shoulder
arthroplasty

EXAM:
MRI OF THE RIGHT SHOULDER WITHOUT CONTRAST
TECHNIQUE: Multiplanar, multisequence MR imaging of the shoulder was performed.
No intravenous contrast was administered.

[Series 3: PD fat-sat · axial · 4.0mm · 0.70mm/px · z∈[-17,+68]mm · 7 of 20 slices shown (1 of 3)]
[im 1/20]
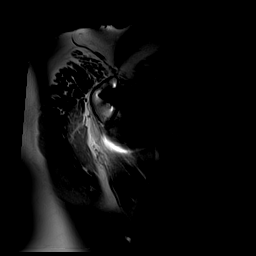
[im 4/20]
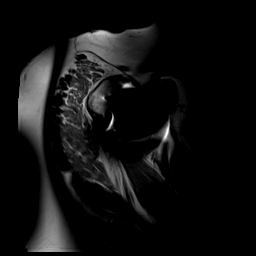
[im 7/20]
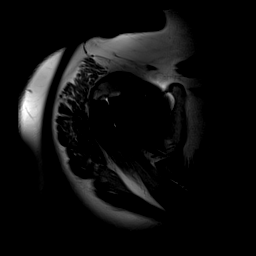
[im 10/20]
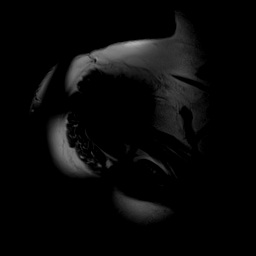
[im 13/20]
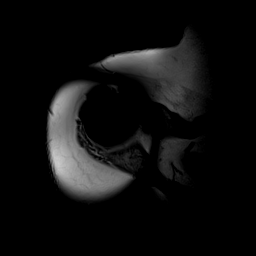
[im 16/20]
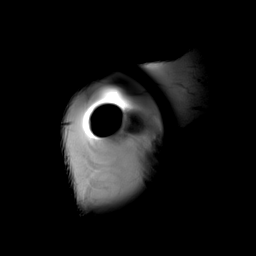
[im 20/20]
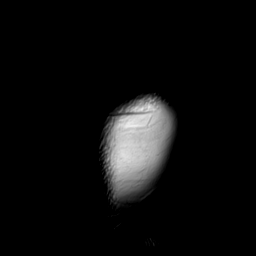

[Series 4: STIR · oblique · 4.0mm · 0.33mm/px · 1 of 18 slices shown]
[im 1/18]
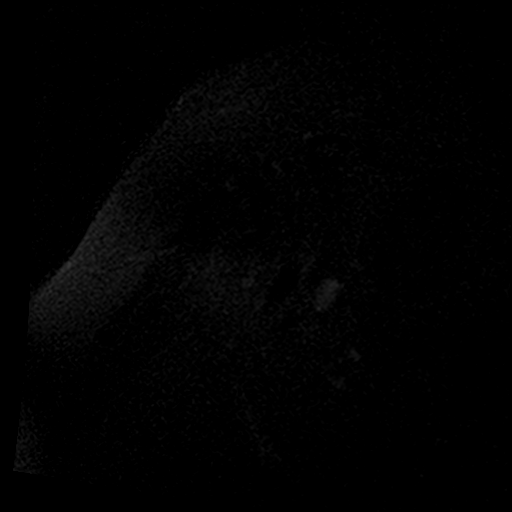

[Series 6: T1 · coronal · 4.0mm · 0.33mm/px · 7 of 22 slices shown]
[im 1/22]
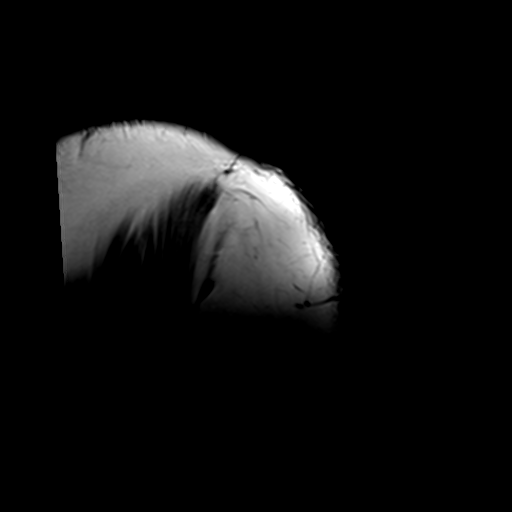
[im 4/22]
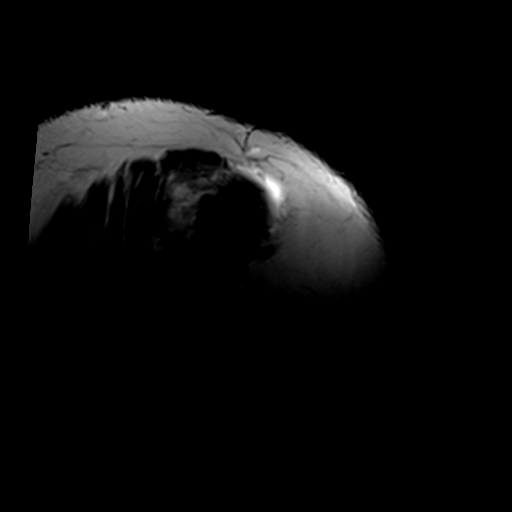
[im 8/22]
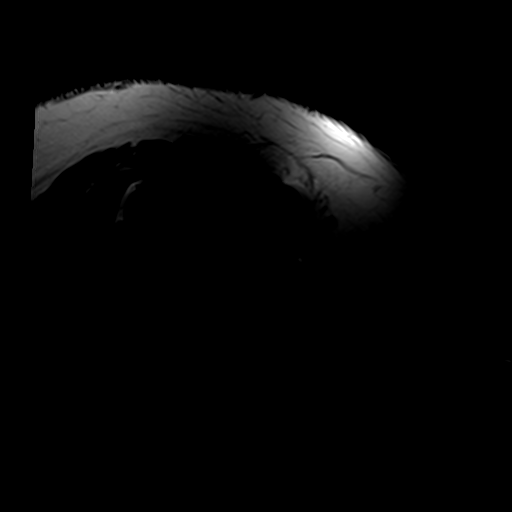
[im 11/22]
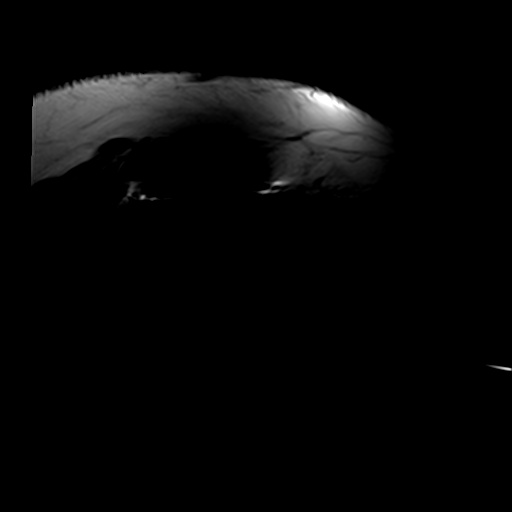
[im 15/22]
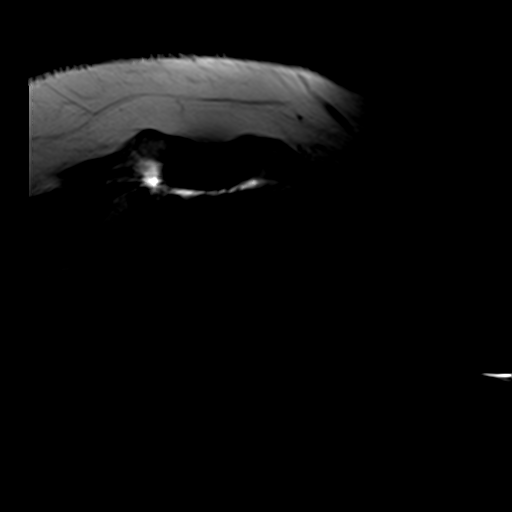
[im 18/22]
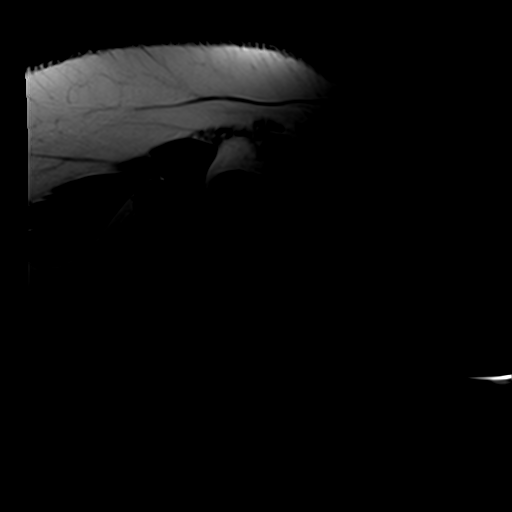
[im 22/22]
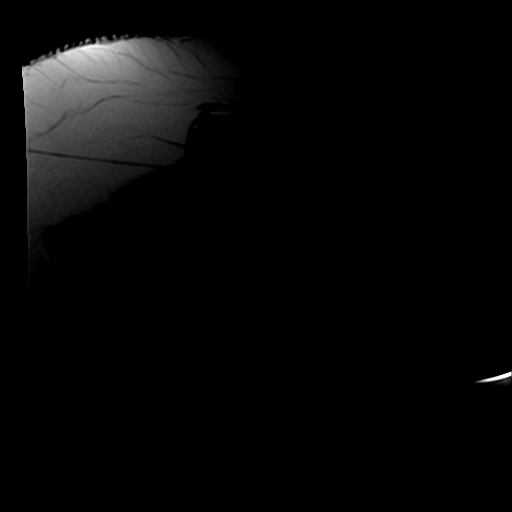

[Series 7: PD fat-sat · oblique · 4.0mm · 0.33mm/px · 6 of 18 slices shown (2 of 3)]
[im 1/18]
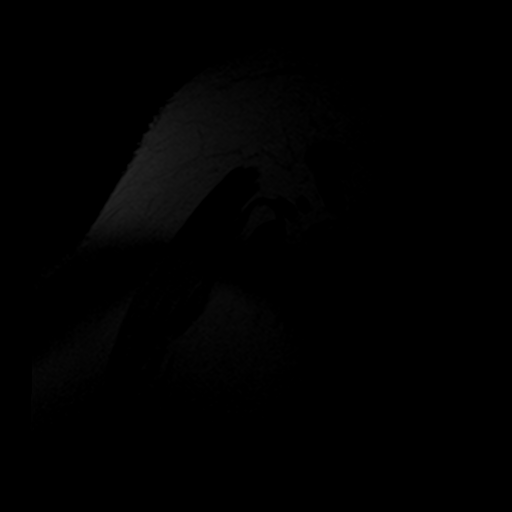
[im 4/18]
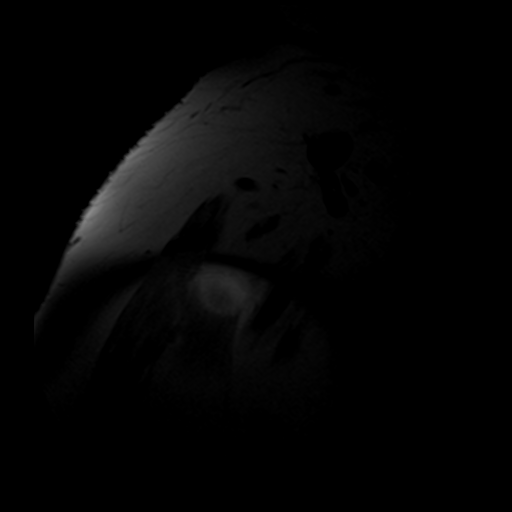
[im 7/18]
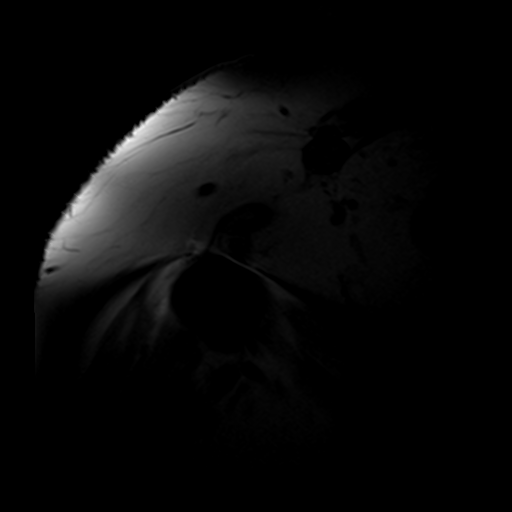
[im 11/18]
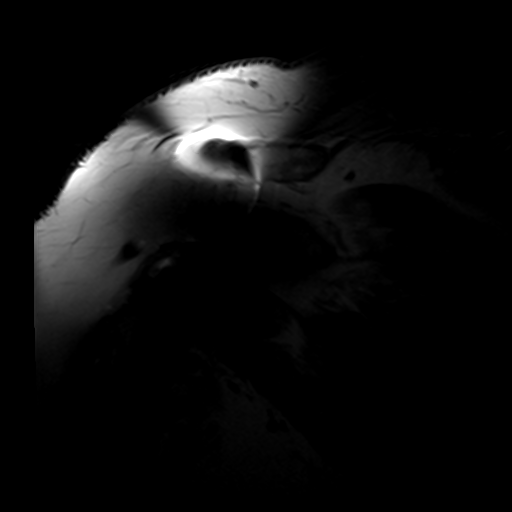
[im 14/18]
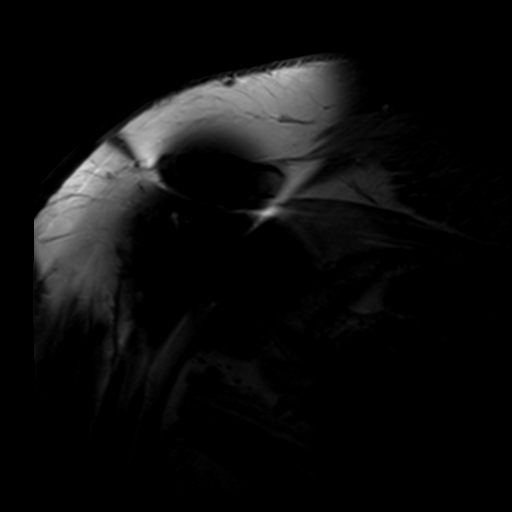
[im 18/18]
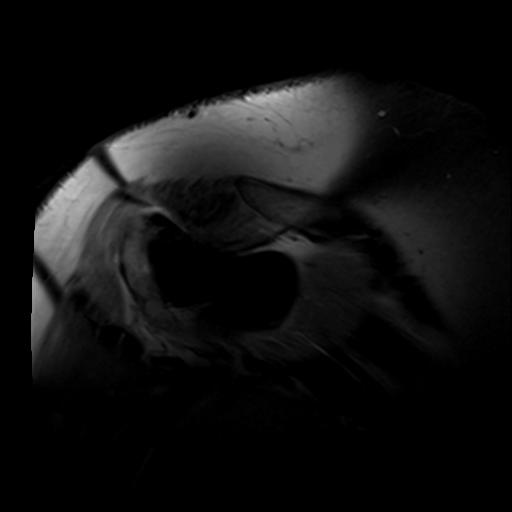

[Series 8: PD fat-sat · axial · 4.0mm · 0.70mm/px · z∈[-31,+54]mm · 7 of 20 slices shown (3 of 3)]
[im 1/20]
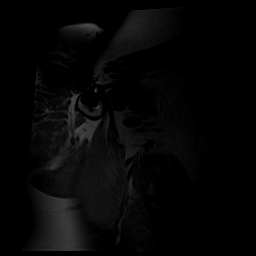
[im 4/20]
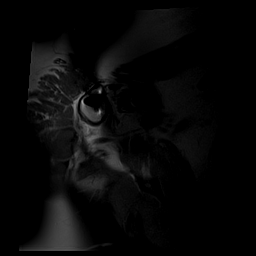
[im 7/20]
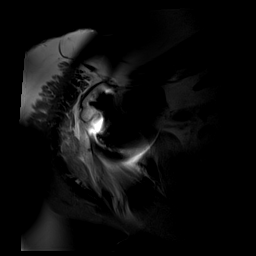
[im 10/20]
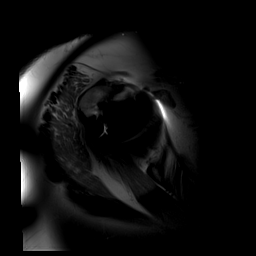
[im 13/20]
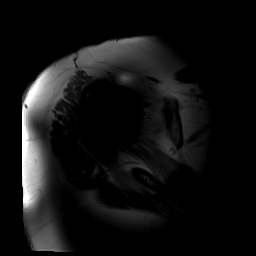
[im 16/20]
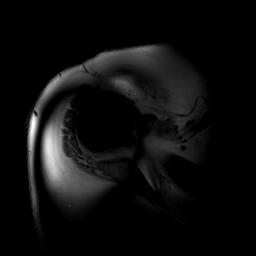
[im 20/20]
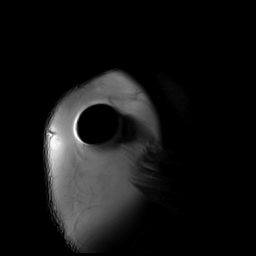

[28 of 40 positions shown; findings below may reference images not displayed]

FINDINGS: Technical note: Examination is limited secondary to extensive
metallic susceptibility artifact from patient's arthroplasty
hardware. Metal artifact reduction sequences were utilized.

Rotator cuff: Rotator cuff tendons are largely obscured by
susceptibility artifact. Subscapularis and teres minor tendons
appear at least partially intact. Supraspinatus and infraspinatus
tendons unable to be evaluated.

Muscles: Preserved muscle bulk and signal intensity without atrophy
or fatty infiltration.

Biceps long head: Not visualized, may reflect prior tenodesis or
tenotomy.

Acromioclavicular Joint: Unremarkable AC joint. No appreciable fluid
is evident within the subacromial-subdeltoid bursa.

Glenohumeral Joint and Labrum: Status post right total shoulder
arthroplasty. Alignment appears within normal limits within the
limitations of this exam. No periprosthetic fluid collection is
seen.

Bones: No fracture or abnormality of the visualized marrow
structures.

Other: None.
IMPRESSION: 1. Limited examination secondary to extensive metallic
susceptibility artifact from patient's arthroplasty hardware.
2. Grossly intact rotator cuff tendons within the limitations of
this exam.
3. No rotator cuff muscle atrophy or fatty infiltration.
4. Nonvisualization of the long head of the biceps, may reflect
prior tenodesis or tenotomy.

## 2021-03-02 ENCOUNTER — Encounter: Payer: Self-pay | Admitting: Internal Medicine

## 2021-03-03 ENCOUNTER — Other Ambulatory Visit: Payer: Self-pay | Admitting: Internal Medicine

## 2021-03-03 MED ORDER — HYDROCODONE-ACETAMINOPHEN 10-325 MG PO TABS: 0.5000 | ORAL_TABLET | Freq: Four times a day (QID) | ORAL | 0 refills | Status: AC | PRN

## 2021-03-16 ENCOUNTER — Other Ambulatory Visit: Payer: Self-pay | Admitting: Internal Medicine

## 2021-03-19 ENCOUNTER — Other Ambulatory Visit (INDEPENDENT_AMBULATORY_CARE_PROVIDER_SITE_OTHER): Payer: BC Managed Care – PPO

## 2021-03-19 ENCOUNTER — Other Ambulatory Visit: Payer: Self-pay | Admitting: Internal Medicine

## 2021-03-19 DIAGNOSIS — D72829 Elevated white blood cell count, unspecified: Secondary | ICD-10-CM | POA: Diagnosis not present

## 2021-03-19 LAB — CBC WITH DIFFERENTIAL/PLATELET
Basophils Absolute: 0.1 10*3/uL (ref 0.0–0.1)
Basophils Relative: 0.6 % (ref 0.0–3.0)
Eosinophils Absolute: 0.1 10*3/uL (ref 0.0–0.7)
Eosinophils Relative: 1.1 % (ref 0.0–5.0)
HCT: 42.1 % (ref 36.0–46.0)
Hemoglobin: 13.8 g/dL (ref 12.0–15.0)
Lymphocytes Relative: 20.7 % (ref 12.0–46.0)
Lymphs Abs: 2.3 10*3/uL (ref 0.7–4.0)
MCHC: 32.7 g/dL (ref 30.0–36.0)
MCV: 89.7 fl (ref 78.0–100.0)
Monocytes Absolute: 0.8 10*3/uL (ref 0.1–1.0)
Monocytes Relative: 7 % (ref 3.0–12.0)
Neutro Abs: 8 10*3/uL — ABNORMAL HIGH (ref 1.4–7.7)
Neutrophils Relative %: 70.6 % (ref 43.0–77.0)
Platelets: 291 10*3/uL (ref 150.0–400.0)
RBC: 4.69 Mil/uL (ref 3.87–5.11)
RDW: 14.3 % (ref 11.5–15.5)
WBC: 11.3 10*3/uL — ABNORMAL HIGH (ref 4.0–10.5)

## 2021-03-19 LAB — SEDIMENTATION RATE: Sed Rate: 64 mm/hr — ABNORMAL HIGH (ref 0–30)

## 2021-03-19 LAB — C-REACTIVE PROTEIN: CRP: 10.6 mg/dL (ref 0.5–20.0)

## 2021-03-21 ENCOUNTER — Other Ambulatory Visit: Payer: Self-pay

## 2021-03-22 ENCOUNTER — Other Ambulatory Visit: Payer: Self-pay | Admitting: Internal Medicine

## 2021-03-31 ENCOUNTER — Encounter: Payer: Self-pay | Admitting: Internal Medicine

## 2021-04-03 ENCOUNTER — Telehealth: Payer: Self-pay | Admitting: Internal Medicine

## 2021-04-03 NOTE — Telephone Encounter (Signed)
Pt checking response status of mychart mssg from 3-25 ? ?Please see mssg ?

## 2021-04-06 MED ORDER — HYDROCODONE-ACETAMINOPHEN 10-325 MG PO TABS: 0.5000 | ORAL_TABLET | Freq: Four times a day (QID) | ORAL | 0 refills | Status: AC | PRN

## 2021-04-06 NOTE — Telephone Encounter (Signed)
OK. Thx

## 2021-04-23 ENCOUNTER — Other Ambulatory Visit (INDEPENDENT_AMBULATORY_CARE_PROVIDER_SITE_OTHER): Payer: BC Managed Care – PPO

## 2021-04-23 ENCOUNTER — Other Ambulatory Visit: Payer: Self-pay | Admitting: Internal Medicine

## 2021-04-23 DIAGNOSIS — F325 Major depressive disorder, single episode, in full remission: Secondary | ICD-10-CM

## 2021-04-23 DIAGNOSIS — M2550 Pain in unspecified joint: Secondary | ICD-10-CM | POA: Diagnosis not present

## 2021-04-23 DIAGNOSIS — L659 Nonscarring hair loss, unspecified: Secondary | ICD-10-CM | POA: Diagnosis not present

## 2021-04-23 LAB — T4, FREE: Free T4: 1.06 ng/dL (ref 0.60–1.60)

## 2021-04-23 LAB — COMPREHENSIVE METABOLIC PANEL
ALT: 17 U/L (ref 0–35)
AST: 15 U/L (ref 0–37)
Albumin: 3.8 g/dL (ref 3.5–5.2)
Alkaline Phosphatase: 80 U/L (ref 39–117)
BUN: 11 mg/dL (ref 6–23)
CO2: 31 mEq/L (ref 19–32)
Calcium: 9.1 mg/dL (ref 8.4–10.5)
Chloride: 101 mEq/L (ref 96–112)
Creatinine, Ser: 0.63 mg/dL (ref 0.40–1.20)
GFR: 103.66 mL/min (ref >60.00)
Glucose, Bld: 122 mg/dL — ABNORMAL HIGH (ref 70–99)
Potassium: 4.1 mEq/L (ref 3.5–5.1)
Sodium: 139 mEq/L (ref 135–145)
Total Bilirubin: 0.5 mg/dL (ref 0.2–1.2)
Total Protein: 6.9 g/dL (ref 6.0–8.3)

## 2021-04-23 LAB — CBC WITH DIFFERENTIAL/PLATELET
Basophils Absolute: 0 10*3/uL (ref 0.0–0.1)
Basophils Relative: 0.3 % (ref 0.0–3.0)
Eosinophils Absolute: 0.1 10*3/uL (ref 0.0–0.7)
Eosinophils Relative: 1 % (ref 0.0–5.0)
HCT: 43.3 % (ref 36.0–46.0)
Hemoglobin: 14.4 g/dL (ref 12.0–15.0)
Lymphocytes Relative: 17.9 % (ref 12.0–46.0)
Lymphs Abs: 2 10*3/uL (ref 0.7–4.0)
MCHC: 33.3 g/dL (ref 30.0–36.0)
MCV: 90.1 fl (ref 78.0–100.0)
Monocytes Absolute: 0.8 10*3/uL (ref 0.1–1.0)
Monocytes Relative: 6.8 % (ref 3.0–12.0)
Neutro Abs: 8.1 10*3/uL — ABNORMAL HIGH (ref 1.4–7.7)
Neutrophils Relative %: 74 % (ref 43.0–77.0)
Platelets: 292 10*3/uL (ref 150.0–400.0)
RBC: 4.81 Mil/uL (ref 3.87–5.11)
RDW: 14.3 % (ref 11.5–15.5)
WBC: 11 10*3/uL — ABNORMAL HIGH (ref 4.0–10.5)

## 2021-04-23 LAB — SEDIMENTATION RATE: Sed Rate: 43 mm/hr — ABNORMAL HIGH (ref 0–30)

## 2021-04-23 LAB — VITAMIN B12: Vitamin B-12: 514 pg/mL (ref 211–911)

## 2021-04-23 LAB — C-REACTIVE PROTEIN: CRP: 3.5 mg/dL (ref 0.5–20.0)

## 2021-04-23 LAB — VITAMIN D 25 HYDROXY (VIT D DEFICIENCY, FRACTURES): VITD: 25.81 ng/mL — ABNORMAL LOW (ref 30.00–100.00)

## 2021-04-23 LAB — TSH: TSH: 3.6 u[IU]/mL (ref 0.35–5.50)

## 2021-04-23 NOTE — Telephone Encounter (Signed)
Check [place] registry last filled 03/26/2021.Marland KitchenJohny Harper ? ?

## 2021-04-24 ENCOUNTER — Telehealth (INDEPENDENT_AMBULATORY_CARE_PROVIDER_SITE_OTHER): Payer: BC Managed Care – PPO | Admitting: Internal Medicine

## 2021-04-24 ENCOUNTER — Encounter: Payer: Self-pay | Admitting: Internal Medicine

## 2021-04-24 DIAGNOSIS — G8929 Other chronic pain: Secondary | ICD-10-CM

## 2021-04-24 DIAGNOSIS — M5440 Lumbago with sciatica, unspecified side: Secondary | ICD-10-CM

## 2021-04-24 DIAGNOSIS — E7439 Other disorders of intestinal carbohydrate absorption: Secondary | ICD-10-CM | POA: Diagnosis not present

## 2021-04-24 DIAGNOSIS — J4520 Mild intermittent asthma, uncomplicated: Secondary | ICD-10-CM | POA: Diagnosis not present

## 2021-04-24 MED ORDER — ALPRAZOLAM 1 MG PO TABS: ORAL_TABLET | ORAL | 2 refills | Status: DC

## 2021-04-24 MED ORDER — VITAMIN D (ERGOCALCIFEROL) 1.25 MG (50000 UNIT) PO CAPS: 50000.0000 [IU] | ORAL_CAPSULE | ORAL | 0 refills | Status: DC

## 2021-04-24 MED ORDER — HYDROCODONE-ACETAMINOPHEN 10-325 MG PO TABS: 0.5000 | ORAL_TABLET | Freq: Four times a day (QID) | ORAL | 0 refills | Status: DC | PRN

## 2021-04-24 MED ORDER — CHOLECALCIFEROL 25 MCG (1000 UT) PO TABS: 2000.0000 [IU] | ORAL_TABLET | Freq: Every day | ORAL | 3 refills | Status: DC

## 2021-04-24 MED ORDER — OZEMPIC (0.25 OR 0.5 MG/DOSE) 2 MG/1.5ML ~~LOC~~ SOPN: 0.5000 mg | PEN_INJECTOR | SUBCUTANEOUS | 3 refills | Status: DC

## 2021-04-24 MED ORDER — VITAMIN D3 50 MCG (2000 UT) PO CAPS: 2000.0000 [IU] | ORAL_CAPSULE | Freq: Every day | ORAL | 3 refills | Status: AC

## 2021-04-24 NOTE — Assessment & Plan Note (Signed)
New ?Ozempic to start ?

## 2021-04-24 NOTE — Assessment & Plan Note (Signed)
Ozempic to start ?

## 2021-04-24 NOTE — Assessment & Plan Note (Signed)
Chronic ?Cont on Norco prn - 10/325 ? Potential benefits of a long term opioids use as well as potential risks (i.e. addiction risk, apnea etc) and complications (i.e. Somnolence, constipation and others) were explained to the patient and were aknowledged. ? ?

## 2021-04-24 NOTE — Progress Notes (Signed)
Virtual Visit via Video Note ? ?I connected with [name] on 04/24/21 at  1:40 PM EDT by a video enabled telemedicine application and verified that I am speaking with the correct person using two identifiers. ?  ?I discussed the limitations of evaluation and management by telemedicine and the availability of in person appointments. The patient expressed understanding and agreed to proceed. ? ?I was located at our [facility] office. ?The patient was at home. ?There was no one else present in the visit. ? ?No chief complaint on file. ?  ? ?History of Present Illness: ? ? ?Review of Systems  ?Constitutional:  Negative for chills and fever.  ?HENT:  Negative for sinus pain and sore throat.   ?Musculoskeletal:  Positive for back pain and joint pain.  ?Skin:  Negative for rash.  ?Neurological:  Negative for dizziness.  ?Psychiatric/Behavioral:  Positive for depression. The patient is nervous/anxious.   ? ? ?Observations/Objective: ?The patient appears to be in no acute distress ? ?Assessment and Plan: ? ?Problem List Items Addressed This Visit   ? ? OBESITY, MORBID  ?  Ozempic to start ? ?  ?  ? Relevant Medications  ? Semaglutide,0.25 or 0.5MG/DOS, (OZEMPIC, 0.25 OR 0.5 MG/DOSE,) 2 MG/1.5ML SOPN  ? Asthma  ?  Treat allergies ? ?  ?  ? LOW BACK PAIN  ?  Chronic ?Cont on Norco prn - 10/325 ? Potential benefits of a long term opioids use as well as potential risks (i.e. addiction risk, apnea etc) and complications (i.e. Somnolence, constipation and others) were explained to the patient and were aknowledged. ? ? ?  ?  ? Relevant Medications  ? HYDROcodone-acetaminophen (NORCO) 10-325 MG tablet  ? HYDROcodone-acetaminophen (NORCO) 10-325 MG tablet  ? HYDROcodone-acetaminophen (NORCO) 10-325 MG tablet  ? Glucose intolerance  ?  New ?Ozempic to start ? ?  ?  ? ? ? ?Meds ordered this encounter  ?Medications  ? Cholecalciferol 25 MCG (1000 UT) tablet  ?  Sig: Take 2 tablets (2,000 Units total) by mouth daily.  ?   Dispense:  100 tablet  ?  Refill:  3  ? Vitamin D, Ergocalciferol, (DRISDOL) 1.25 MG (50000 UNIT) CAPS capsule  ?  Sig: Take 1 capsule (50,000 Units total) by mouth every 7 (seven) days.  ?  Dispense:  6 capsule  ?  Refill:  0  ? Cholecalciferol (VITAMIN D3) 50 MCG (2000 UT) capsule  ?  Sig: Take 1 capsule (2,000 Units total) by mouth daily.  ?  Dispense:  100 capsule  ?  Refill:  3  ? HYDROcodone-acetaminophen (NORCO) 10-325 MG tablet  ?  Sig: Take 0.5 tablets by mouth every 6 (six) hours as needed.  ?  Dispense:  60 tablet  ?  Refill:  0  ?  Please fill on or after 05/07/21  ? ALPRAZolam (XANAX) 1 MG tablet  ?  Sig: TAKE 1 TABLET 3 TIMES A DAY AS NEEDED FOR ANXIETY OR SLEEP  ?  Dispense:  90 tablet  ?  Refill:  2  ?  This request is for a new prescription for a controlled substance as required by Federal/State law.  ? HYDROcodone-acetaminophen (NORCO) 10-325 MG tablet  ?  Sig: Take 0.5 tablets by mouth every 6 (six) hours as needed.  ?  Dispense:  60 tablet  ?  Refill:  0  ?  Please fill on or after 06/06/21  ? HYDROcodone-acetaminophen (NORCO) 10-325 MG tablet  ?  Sig: Take 0.5  tablets by mouth every 6 (six) hours as needed for severe pain.  ?  Dispense:  60 tablet  ?  Refill:  0  ?  Please fill on or after 07/06/21  ? Semaglutide,0.25 or 0.5MG/DOS, (OZEMPIC, 0.25 OR 0.5 MG/DOSE,) 2 MG/1.5ML SOPN  ?  Sig: Inject 0.5 mg into the skin once a week.  ?  Dispense:  1.5 mL  ?  Refill:  3  ?  ? ?Follow Up Instructions: ? ?  ?I discussed the assessment and treatment plan with the patient. The patient was provided an opportunity to ask questions and all were answered. The patient agreed with the plan and demonstrated an understanding of the instructions. ?  ?The patient was advised to call back or seek an in-person evaluation if the symptoms worsen or if the condition fails to improve as anticipated. ? ?I provided face-to-face time during this encounter. We were at different locations. ? ? ?Walker Kehr, MD ? ?

## 2021-04-24 NOTE — Assessment & Plan Note (Signed)
Treat allergies ?

## 2021-04-24 NOTE — Patient Instructions (Signed)
Blue-Emu cream -- use 2-3 times a day ? ?

## 2021-04-26 ENCOUNTER — Encounter: Payer: Self-pay | Admitting: Internal Medicine

## 2021-04-26 NOTE — Telephone Encounter (Signed)
Submitted PA via cover-my-meds w/ (Key: [id number]). Rec'd msg stating " Your information has been submitted to [place]. To check for an updated outcome later,"/LMB ?

## 2021-04-27 NOTE — Telephone Encounter (Signed)
Rec'd determination fax med was " DENIED". It states you do not meet the requirement of your plan. Your plans covers this drug only if you have type 2 diabetes". Letter was mailed to pt from insurance.Marland KitchenJohny Chess ?

## 2021-05-09 ENCOUNTER — Encounter: Payer: Self-pay | Admitting: Internal Medicine

## 2021-05-11 ENCOUNTER — Other Ambulatory Visit: Payer: Self-pay | Admitting: Internal Medicine

## 2021-05-11 MED ORDER — HYDROCODONE-ACETAMINOPHEN 10-325 MG PO TABS: 0.5000 | ORAL_TABLET | Freq: Four times a day (QID) | ORAL | 0 refills | Status: AC | PRN

## 2021-05-15 ENCOUNTER — Other Ambulatory Visit: Payer: Self-pay | Admitting: Internal Medicine

## 2021-06-28 DIAGNOSIS — L921 Necrobiosis lipoidica, not elsewhere classified: Secondary | ICD-10-CM | POA: Diagnosis not present

## 2021-07-19 ENCOUNTER — Other Ambulatory Visit: Payer: Self-pay | Admitting: Internal Medicine

## 2021-07-25 ENCOUNTER — Encounter: Payer: Self-pay | Admitting: Internal Medicine

## 2021-07-25 ENCOUNTER — Other Ambulatory Visit: Payer: Self-pay | Admitting: Internal Medicine

## 2021-07-25 NOTE — Telephone Encounter (Signed)
Check  registry last filled 06/25/2021.Marland KitchenJohny Harper

## 2021-08-27 ENCOUNTER — Ambulatory Visit (INDEPENDENT_AMBULATORY_CARE_PROVIDER_SITE_OTHER): Payer: BC Managed Care – PPO | Admitting: Internal Medicine

## 2021-08-27 ENCOUNTER — Encounter: Payer: Self-pay | Admitting: Internal Medicine

## 2021-08-27 DIAGNOSIS — F329 Major depressive disorder, single episode, unspecified: Secondary | ICD-10-CM

## 2021-08-27 DIAGNOSIS — G8929 Other chronic pain: Secondary | ICD-10-CM

## 2021-08-27 DIAGNOSIS — M5440 Lumbago with sciatica, unspecified side: Secondary | ICD-10-CM | POA: Diagnosis not present

## 2021-08-27 MED ORDER — HYDROCODONE-ACETAMINOPHEN 10-325 MG PO TABS: 0.5000 | ORAL_TABLET | Freq: Four times a day (QID) | ORAL | 0 refills | Status: DC | PRN

## 2021-08-27 MED ORDER — ALBUTEROL SULFATE HFA 108 (90 BASE) MCG/ACT IN AERS: 2.0000 | INHALATION_SPRAY | Freq: Four times a day (QID) | RESPIRATORY_TRACT | 2 refills | Status: DC | PRN

## 2021-08-27 MED ORDER — ALPRAZOLAM 1 MG PO TABS: ORAL_TABLET | ORAL | 3 refills | Status: DC

## 2021-08-27 NOTE — Progress Notes (Signed)
Subjective:  Patient ID: Stephanie Harper, female    DOB: [birth year]  Age: 51 y.o. MRN: [id number]  CC: Follow-up (3 month f/u) and Medication Refill (Need refill on Hydrocodone, alprazolam, and ventolin)   HPI ROLLANDE Harper presents for LBP, anxiety, asthma  Outpatient Medications Prior to Visit  Medication Sig Dispense Refill   BREO ELLIPTA 100-25 MCG/ACT AEPB INHALE 1 PUFF BY MOUTH EVERY DAY 180 each 3   buPROPion ER (WELLBUTRIN SR) 100 MG 12 hr tablet TAKE 1 TABLET BY MOUTH TWICE A DAY 180 tablet 2   Cholecalciferol (VITAMIN D3) 50 MCG (2000 UT) capsule Take 1 capsule (2,000 Units total) by mouth daily. 100 capsule 3   Cholecalciferol 25 MCG (1000 UT) tablet Take 2 tablets (2,000 Units total) by mouth daily. 100 tablet 3   escitalopram (LEXAPRO) 20 MG tablet TAKE 1 TABLET BY MOUTH EVERY DAY 90 tablet 1   furosemide (LASIX) 40 MG tablet Take 1 tablet (40 mg total) by mouth daily as needed. (Patient taking differently: Take 40 mg by mouth daily as needed for fluid.) 90 tablet 3   ibuprofen (ADVIL) 600 MG tablet TAKE 1 TABLET (600 MG TOTAL) BY MOUTH 2 (TWO) TIMES DAILY AS NEEDED. 180 tablet 1   Melatonin 10 MG CAPS Take 10 mg by mouth at bedtime as needed (sleep).     Multiple Vitamin (MULTIVITAMIN) tablet Take 1 tablet by mouth 2 (two) times daily.      mupirocin ointment (BACTROBAN) 2 % Apply topically.     niacinamide 500 MG tablet Take 500 mg by mouth 2 (two) times daily.      pentoxifylline (TRENTAL) 400 MG CR tablet Take 400 mg by mouth daily.      Semaglutide,0.25 or 0.'5MG'$ /DOS, (OZEMPIC, 0.25 OR 0.5 MG/DOSE,) 2 MG/1.5ML SOPN Inject 0.5 mg into the skin once a week. 1.5 mL 3   vitamin B-12 (CYANOCOBALAMIN) 1000 MCG tablet Take 1,000 mcg by mouth daily.     Vitamin D, Ergocalciferol, (DRISDOL) 1.25 MG (50000 UNIT) CAPS capsule TAKE 1 CAPSULE (50,000 UNITS TOTAL) BY MOUTH EVERY 7 (SEVEN) DAYS 4 capsule 4   albuterol (VENTOLIN HFA) 108 (90 Base) MCG/ACT inhaler INHALE 2 PUFFS BY  MOUTH EVERY 6 HOURS AS NEEDED 6.7 each 2   ALPRAZolam (XANAX) 1 MG tablet TAKE 1 TABLET 3 TIMES A DAY AS NEEDED FOR ANXIETY OR SLEEP 90 tablet 1   HYDROcodone-acetaminophen (NORCO) 10-325 MG tablet Take 0.5 tablets by mouth every 6 (six) hours as needed. 60 tablet 0   HYDROcodone-acetaminophen (NORCO) 10-325 MG tablet Take 0.5 tablets by mouth every 6 (six) hours as needed for severe pain. 60 tablet 0   methocarbamol (ROBAXIN) 500 MG tablet TAKE 1 TABLET (500 MG TOTAL) BY MOUTH 2 (TWO) TIMES DAILY AS NEEDED FOR MUSCLE SPASMS. (Patient not taking: Reported on 08/27/2021) 60 tablet 0   No facility-administered medications prior to visit.    ROS: Review of Systems  Constitutional:  Negative for activity change, appetite change, chills, fatigue and unexpected weight change.  HENT:  Negative for congestion, mouth sores and sinus pressure.   Eyes:  Negative for visual disturbance.  Respiratory:  Negative for cough and chest tightness.   Gastrointestinal:  Negative for abdominal pain and nausea.  Genitourinary:  Negative for difficulty urinating, frequency and vaginal pain.  Musculoskeletal:  Negative for back pain and gait problem.  Skin:  Negative for pallor and rash.  Neurological:  Negative for dizziness, tremors, weakness, numbness and headaches.  Psychiatric/Behavioral:  Negative for confusion and sleep disturbance.     Objective:  BP 138/78 (BP Location: Left Arm)   Pulse 94   Temp 98.4 F (36.9 C) (Oral)   Ht '5\' 7"'$  (1.702 m)   Wt (!) 391 lb 9.6 oz (177.6 kg)   SpO2 95%   BMI 61.33 kg/m   BP Readings from Last 3 Encounters:  08/27/21 138/78  01/24/21 (!) 150/88  04/25/20 (!) 148/98    Wt Readings from Last 3 Encounters:  08/27/21 (!) 391 lb 9.6 oz (177.6 kg)  02/21/21 (!) 400 lb (181.4 kg)  01/24/21 (!) 400 lb (181.4 kg)    Physical Exam Constitutional:      General: She is not in acute distress.    Appearance: She is well-developed. She is obese.  HENT:     Head:  Normocephalic.     Right Ear: External ear normal.     Left Ear: External ear normal.     Nose: Nose normal.  Eyes:     General:        Right eye: No discharge.        Left eye: No discharge.     Conjunctiva/sclera: Conjunctivae normal.     Pupils: Pupils are equal, round, and reactive to light.  Neck:     Thyroid: No thyromegaly.     Vascular: No JVD.     Trachea: No tracheal deviation.  Cardiovascular:     Rate and Rhythm: Normal rate and regular rhythm.     Heart sounds: Normal heart sounds.  Pulmonary:     Effort: No respiratory distress.     Breath sounds: No stridor. No wheezing.  Abdominal:     General: Bowel sounds are normal. There is no distension.     Palpations: Abdomen is soft. There is no mass.     Tenderness: There is no abdominal tenderness. There is no guarding or rebound.  Musculoskeletal:        General: Tenderness present.     Cervical back: Normal range of motion and neck supple. No rigidity.  Lymphadenopathy:     Cervical: No cervical adenopathy.  Skin:    Findings: No erythema or rash.  Neurological:     Cranial Nerves: No cranial nerve deficit.     Motor: No abnormal muscle tone.     Coordination: Coordination normal.     Deep Tendon Reflexes: Reflexes normal.  Psychiatric:        Behavior: Behavior normal.        Thought Content: Thought content normal.        Judgment: Judgment normal.     Lab Results  Component Value Date   WBC 11.0 (H) 04/23/2021   HGB 14.4 04/23/2021   HCT 43.3 04/23/2021   PLT 292.0 04/23/2021   GLUCOSE 122 (H) 04/23/2021   CHOL 171 01/24/2021   TRIG 102.0 01/24/2021   HDL 49.50 01/24/2021   LDLCALC 101 (H) 01/24/2021   ALT 17 04/23/2021   AST 15 04/23/2021   NA 139 04/23/2021   K 4.1 04/23/2021   CL 101 04/23/2021   CREATININE 0.63 04/23/2021   BUN 11 04/23/2021   CO2 31 04/23/2021   TSH 3.60 04/23/2021   INR 1.0 08/09/2007   HGBA1C 6.1 01/24/2021    No results found.  Assessment & Plan:   Problem  List Items Addressed This Visit     Depression    Chronic Cont on Wellbutrin, Paxil 6/22 Situational.  It has been worse.  She  has been home confined due to high gas prices.  Continue with Wellbutrin and Paxil.  Psychology consultation - Seeing Dr Michail Sermon for counseling. Better      Relevant Medications   ALPRAZolam (XANAX) 1 MG tablet   LOW BACK PAIN    Cont on Norco prn - 10/325 0.5 tab QID  Potential benefits of a long term opioids use as well as potential risks (i.e. addiction risk, apnea etc) and complications (i.e. Somnolence, constipation and others) were explained to the patient and were aknowledged. Blue-Emu cream was recommended to use 2-3 times a day      Relevant Medications   HYDROcodone-acetaminophen (NORCO) 10-325 MG tablet   HYDROcodone-acetaminophen (NORCO) 10-325 MG tablet   HYDROcodone-acetaminophen (NORCO) 10-325 MG tablet   OBESITY, MORBID    Ozempic to start not covered         Meds ordered this encounter  Medications   HYDROcodone-acetaminophen (NORCO) 10-325 MG tablet    Sig: Take 0.5 tablets by mouth every 6 (six) hours as needed.    Dispense:  60 tablet    Refill:  0    Please fill on or after 10/11/21   HYDROcodone-acetaminophen (NORCO) 10-325 MG tablet    Sig: Take 0.5 tablets by mouth every 6 (six) hours as needed for severe pain.    Dispense:  60 tablet    Refill:  0    Please fill on or after 09/11/21   ALPRAZolam Duanne Moron) 1 MG tablet    Sig: May take 1 tablet (1 mg total) by mouth 3 (three) times daily as needed. May also take 1 tablet (1 mg total) 3 (three) times daily as needed.    Dispense:  90 tablet    Refill:  3   albuterol (VENTOLIN HFA) 108 (90 Base) MCG/ACT inhaler    Sig: Inhale 2 puffs into the lungs every 6 (six) hours as needed.    Dispense:  6.7 each    Refill:  2    PATIENT NEEDS REFILLS   HYDROcodone-acetaminophen (NORCO) 10-325 MG tablet    Sig: Take 0.5 tablets by mouth every 6 (six) hours as needed.    Dispense:  60  tablet    Refill:  0      Follow-up: Return in about 3 months (around 11/27/2021) for a follow-up visit.  Walker Kehr, MD

## 2021-08-27 NOTE — Assessment & Plan Note (Addendum)
Ozempic to start not covered

## 2021-08-27 NOTE — Assessment & Plan Note (Signed)
Chronic Cont on Wellbutrin, Paxil 6/22 Situational.  It has been worse.  She has been home confined due to high gas prices.  Continue with Wellbutrin and Paxil.  Psychology consultation - Seeing Dr Michail Sermon for counseling. Better

## 2021-08-27 NOTE — Patient Instructions (Signed)
RYBELSUS

## 2021-08-27 NOTE — Assessment & Plan Note (Signed)
Cont on Norco prn - 10/325 0.5 tab QID  Potential benefits of a long term opioids use as well as potential risks (i.e. addiction risk, apnea etc) and complications (i.e. Somnolence, constipation and others) were explained to the patient and were aknowledged. Blue-Emu cream was recommended to use 2-3 times a day

## 2021-08-28 ENCOUNTER — Encounter: Payer: Self-pay | Admitting: Internal Medicine

## 2021-08-30 ENCOUNTER — Other Ambulatory Visit: Payer: Self-pay | Admitting: Internal Medicine

## 2021-08-30 MED ORDER — WEGOVY 0.25 MG/0.5ML ~~LOC~~ SOAJ: 0.2500 mg | SUBCUTANEOUS | 2 refills | Status: DC

## 2021-09-12 ENCOUNTER — Telehealth: Payer: Self-pay

## 2021-09-12 NOTE — Telephone Encounter (Signed)
Called pt to get an update on last Mammogram and to see she is needing assistance w/ getting one sched as she is over due.

## 2021-09-18 ENCOUNTER — Encounter: Payer: Self-pay | Admitting: Internal Medicine

## 2021-09-29 ENCOUNTER — Encounter: Payer: Self-pay | Admitting: Internal Medicine

## 2021-10-02 ENCOUNTER — Encounter: Payer: Self-pay | Admitting: Internal Medicine

## 2021-10-02 ENCOUNTER — Ambulatory Visit (INDEPENDENT_AMBULATORY_CARE_PROVIDER_SITE_OTHER): Payer: BC Managed Care – PPO | Admitting: Internal Medicine

## 2021-10-02 DIAGNOSIS — R310 Gross hematuria: Secondary | ICD-10-CM | POA: Diagnosis not present

## 2021-10-02 NOTE — Progress Notes (Signed)
Subjective:  Patient ID: Stephanie Harper, female    DOB: 24-Apr-1970  Age: 51 y.o. MRN: [id number]  CC: Hematuria   HPI MAGGY WYBLE presents for hematuria x 2 LMP 2006 ablation  Outpatient Medications Prior to Visit  Medication Sig Dispense Refill   albuterol (VENTOLIN HFA) 108 (90 Base) MCG/ACT inhaler Inhale 2 puffs into the lungs every 6 (six) hours as needed. 6.7 each 2   ALPRAZolam (XANAX) 1 MG tablet May take 1 tablet (1 mg total) by mouth 3 (three) times daily as needed. May also take 1 tablet (1 mg total) 3 (three) times daily as needed. 90 tablet 3   BREO ELLIPTA 100-25 MCG/ACT AEPB INHALE 1 PUFF BY MOUTH EVERY DAY 180 each 3   buPROPion ER (WELLBUTRIN SR) 100 MG 12 hr tablet TAKE 1 TABLET BY MOUTH TWICE A DAY 180 tablet 2   Cholecalciferol (VITAMIN D3) 50 MCG (2000 UT) capsule Take 1 capsule (2,000 Units total) by mouth daily. 100 capsule 3   Cholecalciferol 25 MCG (1000 UT) tablet Take 2 tablets (2,000 Units total) by mouth daily. 100 tablet 3   escitalopram (LEXAPRO) 20 MG tablet TAKE 1 TABLET BY MOUTH EVERY DAY 90 tablet 1   furosemide (LASIX) 40 MG tablet Take 1 tablet (40 mg total) by mouth daily as needed. (Patient taking differently: Take 40 mg by mouth daily as needed for fluid.) 90 tablet 3   HYDROcodone-acetaminophen (NORCO) 10-325 MG tablet Take 0.5 tablets by mouth every 6 (six) hours as needed. 60 tablet 0   HYDROcodone-acetaminophen (NORCO) 10-325 MG tablet Take 0.5 tablets by mouth every 6 (six) hours as needed for severe pain. 60 tablet 0   HYDROcodone-acetaminophen (NORCO) 10-325 MG tablet Take 0.5 tablets by mouth every 6 (six) hours as needed. 60 tablet 0   ibuprofen (ADVIL) 600 MG tablet TAKE 1 TABLET (600 MG TOTAL) BY MOUTH 2 (TWO) TIMES DAILY AS NEEDED. 180 tablet 1   Melatonin 10 MG CAPS Take 10 mg by mouth at bedtime as needed (sleep).     Multiple Vitamin (MULTIVITAMIN) tablet Take 1 tablet by mouth 2 (two) times daily.      mupirocin ointment  (BACTROBAN) 2 % Apply topically.     niacinamide 500 MG tablet Take 500 mg by mouth 2 (two) times daily.      pentoxifylline (TRENTAL) 400 MG CR tablet Take 400 mg by mouth daily.      vitamin B-12 (CYANOCOBALAMIN) 1000 MCG tablet Take 1,000 mcg by mouth daily.     Vitamin D, Ergocalciferol, (DRISDOL) 1.25 MG (50000 UNIT) CAPS capsule TAKE 1 CAPSULE (50,000 UNITS TOTAL) BY MOUTH EVERY 7 (SEVEN) DAYS 4 capsule 4   Semaglutide-Weight Management (WEGOVY) 0.25 MG/0.5ML SOAJ Inject 0.25 mg into the skin once a week. 2 mL 2   No facility-administered medications prior to visit.    ROS: Review of Systems  Constitutional:  Negative for activity change, appetite change, chills, fatigue, fever and unexpected weight change.  HENT:  Negative for congestion, mouth sores and sinus pressure.   Eyes:  Negative for visual disturbance.  Respiratory:  Negative for cough and chest tightness.   Gastrointestinal:  Negative for abdominal pain and nausea.  Endocrine: Negative for polydipsia.  Genitourinary:  Positive for hematuria. Negative for decreased urine volume, difficulty urinating, dysuria, enuresis, flank pain, frequency, genital sores, pelvic pain, urgency and vaginal pain.  Musculoskeletal:  Negative for back pain and gait problem.  Skin:  Negative for pallor and rash.  Neurological:  Negative for dizziness, tremors, weakness, numbness and headaches.  Psychiatric/Behavioral:  Negative for confusion and sleep disturbance.     Objective:  BP (!) 140/88 (BP Location: Left Arm)   Pulse 82   Temp 98.4 F (36.9 C) (Oral)   Ht '5\' 7"'$  (1.702 m)   Wt (!) 389 lb 6.4 oz (176.6 kg)   SpO2 95%   BMI 60.99 kg/m   BP Readings from Last 3 Encounters:  10/02/21 (!) 140/88  08/27/21 138/78  01/24/21 (!) 150/88    Wt Readings from Last 3 Encounters:  10/02/21 (!) 389 lb 6.4 oz (176.6 kg)  08/27/21 (!) 391 lb 9.6 oz (177.6 kg)  02/21/21 (!) 400 lb (181.4 kg)    Physical Exam Constitutional:       General: She is not in acute distress.    Appearance: She is well-developed. She is obese.  HENT:     Head: Normocephalic.     Right Ear: External ear normal.     Left Ear: External ear normal.     Nose: Nose normal.  Eyes:     General:        Right eye: No discharge.        Left eye: No discharge.     Conjunctiva/sclera: Conjunctivae normal.     Pupils: Pupils are equal, round, and reactive to light.  Neck:     Thyroid: No thyromegaly.     Vascular: No JVD.     Trachea: No tracheal deviation.  Cardiovascular:     Rate and Rhythm: Normal rate and regular rhythm.     Heart sounds: Normal heart sounds.  Pulmonary:     Effort: No respiratory distress.     Breath sounds: No stridor. No wheezing.  Abdominal:     General: Bowel sounds are normal. There is no distension.     Palpations: Abdomen is soft. There is no mass.     Tenderness: There is no abdominal tenderness. There is no guarding or rebound.  Musculoskeletal:        General: No tenderness.     Cervical back: Normal range of motion and neck supple. No rigidity.  Lymphadenopathy:     Cervical: No cervical adenopathy.  Skin:    Findings: No erythema or rash.  Neurological:     Cranial Nerves: No cranial nerve deficit.     Motor: No abnormal muscle tone.     Coordination: Coordination normal.     Deep Tendon Reflexes: Reflexes normal.  Psychiatric:        Behavior: Behavior normal.        Thought Content: Thought content normal.        Judgment: Judgment normal.     Lab Results  Component Value Date   WBC 11.0 (H) 04/23/2021   HGB 14.4 04/23/2021   HCT 43.3 04/23/2021   PLT 292.0 04/23/2021   GLUCOSE 122 (H) 04/23/2021   CHOL 171 01/24/2021   TRIG 102.0 01/24/2021   HDL 49.50 01/24/2021   LDLCALC 101 (H) 01/24/2021   ALT 17 04/23/2021   AST 15 04/23/2021   NA 139 04/23/2021   K 4.1 04/23/2021   CL 101 04/23/2021   CREATININE 0.63 04/23/2021   BUN 11 04/23/2021   CO2 31 04/23/2021   TSH 3.60 04/23/2021    INR 1.0 08/09/2007   HGBA1C 6.1 01/24/2021    No results found.  Assessment & Plan:   Problem List Items Addressed This Visit     Hematuria, gross  Cystitis vs other UA, CBC Urology ref - Dr Claudia Desanctis      Relevant Orders   CBC with Differential/Platelet   Urinalysis   Ambulatory referral to Urology      No orders of the defined types were placed in this encounter.     Follow-up: Return for a follow-up visit.  Walker Kehr, MD

## 2021-10-02 NOTE — Patient Instructions (Signed)

## 2021-10-02 NOTE — Assessment & Plan Note (Addendum)
Cystitis vs other UA, CBC Urology ref - Dr Claudia Desanctis

## 2021-10-03 LAB — CBC WITH DIFFERENTIAL/PLATELET
Basophils Absolute: 0.1 10*3/uL (ref 0.0–0.1)
Basophils Relative: 0.7 % (ref 0.0–3.0)
Eosinophils Absolute: 0.1 10*3/uL (ref 0.0–0.7)
Eosinophils Relative: 1 % (ref 0.0–5.0)
HCT: 42.6 % (ref 36.0–46.0)
Hemoglobin: 14.2 g/dL (ref 12.0–15.0)
Lymphocytes Relative: 16.7 % (ref 12.0–46.0)
Lymphs Abs: 2.3 10*3/uL (ref 0.7–4.0)
MCHC: 33.4 g/dL (ref 30.0–36.0)
MCV: 89.6 fl (ref 78.0–100.0)
Monocytes Absolute: 0.9 10*3/uL (ref 0.1–1.0)
Monocytes Relative: 6.7 % (ref 3.0–12.0)
Neutro Abs: 10.5 10*3/uL — ABNORMAL HIGH (ref 1.4–7.7)
Neutrophils Relative %: 74.9 % (ref 43.0–77.0)
Platelets: 307 10*3/uL (ref 150.0–400.0)
RBC: 4.76 Mil/uL (ref 3.87–5.11)
RDW: 14.2 % (ref 11.5–15.5)
WBC: 14 10*3/uL — ABNORMAL HIGH (ref 4.0–10.5)

## 2021-10-03 LAB — URINALYSIS, ROUTINE W REFLEX MICROSCOPIC
Bilirubin Urine: NEGATIVE
Ketones, ur: NEGATIVE
Leukocytes,Ua: NEGATIVE
Nitrite: NEGATIVE
Specific Gravity, Urine: 1.01 (ref 1.000–1.030)
Total Protein, Urine: NEGATIVE
Urine Glucose: NEGATIVE
Urobilinogen, UA: 0.2 (ref 0.0–1.0)
pH: 6.5 (ref 5.0–8.0)

## 2021-10-04 ENCOUNTER — Encounter: Payer: Self-pay | Admitting: Internal Medicine

## 2021-10-12 ENCOUNTER — Telehealth: Payer: Self-pay | Admitting: *Deleted

## 2021-10-12 ENCOUNTER — Encounter: Payer: Self-pay | Admitting: Internal Medicine

## 2021-10-12 NOTE — Telephone Encounter (Signed)
Rec'd fax stating product is on backorder would like refill or MD change to something else on her Hydrocodone 10/325 mg../MD is out of the office today will respond when he return on Monday...[name]

## 2021-10-15 MED ORDER — HYDROCODONE-ACETAMINOPHEN 5-325 MG PO TABS: 1.0000 | ORAL_TABLET | Freq: Two times a day (BID) | ORAL | 0 refills | Status: DC | PRN

## 2021-10-15 NOTE — Telephone Encounter (Signed)
Okay.  Will replace with Norco 5/325 for 1 month.

## 2021-10-15 NOTE — Telephone Encounter (Signed)
Do I need to call Norco 10/325 to a  different pharmacy? Is 5/325 available? Thanks

## 2021-10-15 NOTE — Telephone Encounter (Signed)
Called [facility] spoke to pharmacist she states the 10/325 mg is on [place], but they do have the 5/325 in stock.Marland KitchenAndee Poles

## 2021-10-16 NOTE — Telephone Encounter (Signed)
MD sent.Marland KitchenJohny Chess

## 2021-10-17 ENCOUNTER — Telehealth: Payer: Self-pay | Admitting: *Deleted

## 2021-10-17 NOTE — Telephone Encounter (Signed)
Rec'd fax stating " pt also take 3 mg of alprazolam daily. Would this be appropriate dose or should it be reduce to the drug interaction.Marland KitchenAndee Poles

## 2021-10-18 NOTE — Telephone Encounter (Signed)
Notified [facility] w/ MD response.Marland KitchenJohny Harper

## 2021-10-18 NOTE — Telephone Encounter (Signed)
It is appropriate.  Thank you

## 2021-10-31 DIAGNOSIS — R310 Gross hematuria: Secondary | ICD-10-CM | POA: Diagnosis not present

## 2021-11-16 ENCOUNTER — Other Ambulatory Visit: Payer: Self-pay | Admitting: Internal Medicine

## 2021-11-16 DIAGNOSIS — R310 Gross hematuria: Secondary | ICD-10-CM | POA: Diagnosis not present

## 2021-11-16 DIAGNOSIS — R319 Hematuria, unspecified: Secondary | ICD-10-CM | POA: Diagnosis not present

## 2021-11-20 MED ORDER — HYDROCODONE-ACETAMINOPHEN 5-325 MG PO TABS: 1.0000 | ORAL_TABLET | Freq: Two times a day (BID) | ORAL | 0 refills | Status: DC | PRN

## 2021-11-27 ENCOUNTER — Ambulatory Visit (INDEPENDENT_AMBULATORY_CARE_PROVIDER_SITE_OTHER): Payer: BC Managed Care – PPO | Admitting: Internal Medicine

## 2021-11-27 ENCOUNTER — Encounter: Payer: Self-pay | Admitting: Internal Medicine

## 2021-11-27 ENCOUNTER — Ambulatory Visit (INDEPENDENT_AMBULATORY_CARE_PROVIDER_SITE_OTHER): Payer: BC Managed Care – PPO

## 2021-11-27 VITALS — BP 142/90 | HR 80 | Temp 98.7°F | Ht 67.0 in | Wt 385.4 lb

## 2021-11-27 DIAGNOSIS — R052 Subacute cough: Secondary | ICD-10-CM | POA: Diagnosis not present

## 2021-11-27 DIAGNOSIS — Z1159 Encounter for screening for other viral diseases: Secondary | ICD-10-CM | POA: Diagnosis not present

## 2021-11-27 DIAGNOSIS — G8929 Other chronic pain: Secondary | ICD-10-CM

## 2021-11-27 DIAGNOSIS — M25522 Pain in left elbow: Secondary | ICD-10-CM

## 2021-11-27 DIAGNOSIS — I10 Essential (primary) hypertension: Secondary | ICD-10-CM | POA: Diagnosis not present

## 2021-11-27 DIAGNOSIS — R053 Chronic cough: Secondary | ICD-10-CM | POA: Diagnosis not present

## 2021-11-27 DIAGNOSIS — M5440 Lumbago with sciatica, unspecified side: Secondary | ICD-10-CM

## 2021-11-27 DIAGNOSIS — J811 Chronic pulmonary edema: Secondary | ICD-10-CM | POA: Diagnosis not present

## 2021-11-27 LAB — CBC WITH DIFFERENTIAL/PLATELET
Basophils Absolute: 0.1 10*3/uL (ref 0.0–0.1)
Basophils Relative: 0.8 % (ref 0.0–3.0)
Eosinophils Absolute: 0.2 10*3/uL (ref 0.0–0.7)
Eosinophils Relative: 1.2 % (ref 0.0–5.0)
HCT: 44.5 % (ref 36.0–46.0)
Hemoglobin: 14.9 g/dL (ref 12.0–15.0)
Lymphocytes Relative: 20 % (ref 12.0–46.0)
Lymphs Abs: 2.4 10*3/uL (ref 0.7–4.0)
MCHC: 33.4 g/dL (ref 30.0–36.0)
MCV: 89.6 fl (ref 78.0–100.0)
Monocytes Absolute: 0.8 10*3/uL (ref 0.1–1.0)
Monocytes Relative: 6.9 % (ref 3.0–12.0)
Neutro Abs: 8.6 10*3/uL — ABNORMAL HIGH (ref 1.4–7.7)
Neutrophils Relative %: 71.1 % (ref 43.0–77.0)
Platelets: 374 10*3/uL (ref 150.0–400.0)
RBC: 4.97 Mil/uL (ref 3.87–5.11)
RDW: 14 % (ref 11.5–15.5)
WBC: 12.1 10*3/uL — ABNORMAL HIGH (ref 4.0–10.5)

## 2021-11-27 LAB — COMPREHENSIVE METABOLIC PANEL
ALT: 21 U/L (ref 0–35)
AST: 19 U/L (ref 0–37)
Albumin: 4 g/dL (ref 3.5–5.2)
Alkaline Phosphatase: 85 U/L (ref 39–117)
BUN: 8 mg/dL (ref 6–23)
CO2: 32 mEq/L (ref 19–32)
Calcium: 9.4 mg/dL (ref 8.4–10.5)
Chloride: 102 mEq/L (ref 96–112)
Creatinine, Ser: 0.65 mg/dL (ref 0.40–1.20)
GFR: 102.46 mL/min (ref >60.00)
Glucose, Bld: 101 mg/dL — ABNORMAL HIGH (ref 70–99)
Potassium: 4.1 mEq/L (ref 3.5–5.1)
Sodium: 139 mEq/L (ref 135–145)
Total Bilirubin: 0.4 mg/dL (ref 0.2–1.2)
Total Protein: 7.5 g/dL (ref 6.0–8.3)

## 2021-11-27 MED ORDER — HYDROCODONE-ACETAMINOPHEN 10-325 MG PO TABS: 0.5000 | ORAL_TABLET | Freq: Four times a day (QID) | ORAL | 0 refills | Status: DC | PRN

## 2021-11-27 MED ORDER — MELOXICAM 15 MG PO TABS: 15.0000 mg | ORAL_TABLET | Freq: Every day | ORAL | 3 refills | Status: DC

## 2021-11-27 MED ORDER — ALPRAZOLAM 1 MG PO TABS: ORAL_TABLET | ORAL | 3 refills | Status: DC

## 2021-11-27 MED ORDER — HYDROCODONE-ACETAMINOPHEN 5-325 MG PO TABS: 1.0000 | ORAL_TABLET | Freq: Two times a day (BID) | ORAL | 0 refills | Status: DC | PRN

## 2021-11-27 NOTE — Assessment & Plan Note (Signed)
Chronic Cont on Norco prn - 10/325 0.5 tab QID  Potential benefits of a long term opioids use as well as potential risks (i.e. addiction risk, apnea etc) and complications (i.e. Somnolence, constipation and others) were explained to the patient and were aknowledged. Blue-Emu cream was recommended to use 2-3 times a day 

## 2021-11-27 NOTE — Assessment & Plan Note (Signed)
Worse R elbow w/pain - see Dr Marlou Sa Meloxicam po qd

## 2021-11-27 NOTE — Progress Notes (Signed)
Subjective:  Patient ID: Stephanie Harper, female    DOB: 1970/10/13  Age: 51 y.o. MRN: [id number]  CC: Follow-up (3 month f/u) and Elbow Injury (Pt states she been having pain in (L) elbow ? Arthritis or gout )   HPI Stephanie Harper presents for chronic pain - LBP Pt states she been having pain in (L) elbow - severe  C/o recent COVID 57 Complaining of elevated blood pressure at home The patient asked to be screened for hepatitis C  Outpatient Medications Prior to Visit  Medication Sig Dispense Refill   albuterol (VENTOLIN HFA) 108 (90 Base) MCG/ACT inhaler Inhale 2 puffs into the lungs every 6 (six) hours as needed. 6.7 each 2   BREO ELLIPTA 100-25 MCG/ACT AEPB INHALE 1 PUFF BY MOUTH EVERY DAY 180 each 3   buPROPion ER (WELLBUTRIN SR) 100 MG 12 hr tablet TAKE 1 TABLET BY MOUTH TWICE A DAY 180 tablet 2   Cholecalciferol (VITAMIN D3) 50 MCG (2000 UT) capsule Take 1 capsule (2,000 Units total) by mouth daily. 100 capsule 3   Cholecalciferol 25 MCG (1000 UT) tablet Take 2 tablets (2,000 Units total) by mouth daily. 100 tablet 3   escitalopram (LEXAPRO) 20 MG tablet TAKE 1 TABLET BY MOUTH EVERY DAY 90 tablet 1   furosemide (LASIX) 40 MG tablet Take 1 tablet (40 mg total) by mouth daily as needed. (Patient taking differently: Take 40 mg by mouth daily as needed for fluid.) 90 tablet 3   HYDROcodone-acetaminophen (NORCO) 10-325 MG tablet Take 0.5 tablets by mouth every 6 (six) hours as needed. 60 tablet 0   ibuprofen (ADVIL) 600 MG tablet TAKE 1 TABLET (600 MG TOTAL) BY MOUTH 2 (TWO) TIMES DAILY AS NEEDED. 180 tablet 1   Melatonin 10 MG CAPS Take 10 mg by mouth at bedtime as needed (sleep).     Multiple Vitamin (MULTIVITAMIN) tablet Take 1 tablet by mouth 2 (two) times daily.      mupirocin ointment (BACTROBAN) 2 % Apply topically.     niacinamide 500 MG tablet Take 500 mg by mouth 2 (two) times daily.      pentoxifylline (TRENTAL) 400 MG CR tablet Take 400 mg by mouth daily.       vitamin B-12 (CYANOCOBALAMIN) 1000 MCG tablet Take 1,000 mcg by mouth daily.     Vitamin D, Ergocalciferol, (DRISDOL) 1.25 MG (50000 UNIT) CAPS capsule TAKE 1 CAPSULE (50,000 UNITS TOTAL) BY MOUTH EVERY 7 (SEVEN) DAYS 4 capsule 4   ALPRAZolam (XANAX) 1 MG tablet May take 1 tablet (1 mg total) by mouth 3 (three) times daily as needed. May also take 1 tablet (1 mg total) 3 (three) times daily as needed. 90 tablet 3   HYDROcodone-acetaminophen (NORCO) 10-325 MG tablet Take 0.5 tablets by mouth every 6 (six) hours as needed for severe pain. 60 tablet 0   HYDROcodone-acetaminophen (NORCO) 10-325 MG tablet Take 0.5 tablets by mouth every 6 (six) hours as needed. 60 tablet 0   HYDROcodone-acetaminophen (NORCO/VICODIN) 5-325 MG tablet Take 1-2 tablets by mouth 2 (two) times daily as needed for severe pain. 120 tablet 0   No facility-administered medications prior to visit.    ROS: Review of Systems  Constitutional:  Negative for activity change, appetite change, chills, fatigue and unexpected weight change.  HENT:  Negative for congestion, mouth sores and sinus pressure.   Eyes:  Negative for visual disturbance.  Respiratory:  Negative for cough and chest tightness.   Gastrointestinal:  Negative for abdominal pain  and nausea.  Genitourinary:  Negative for difficulty urinating, frequency and vaginal pain.  Musculoskeletal:  Positive for arthralgias, back pain and gait problem.  Skin:  Negative for pallor and rash.  Neurological:  Negative for dizziness, tremors, weakness, numbness and headaches.  Psychiatric/Behavioral:  Negative for confusion, sleep disturbance and suicidal ideas. The patient is nervous/anxious.     Objective:  BP (!) 142/90 (BP Location: Left Arm)   Pulse 80   Temp 98.7 F (37.1 C) (Oral)   Ht '5\' 7"'$  (1.702 m)   Wt (!) 385 lb 6.4 oz (174.8 kg)   SpO2 97%   BMI 60.36 kg/m   BP Readings from Last 3 Encounters:  11/27/21 (!) 142/90  10/02/21 (!) 140/88  08/27/21 138/78     Wt Readings from Last 3 Encounters:  11/27/21 (!) 385 lb 6.4 oz (174.8 kg)  10/02/21 (!) 389 lb 6.4 oz (176.6 kg)  08/27/21 (!) 391 lb 9.6 oz (177.6 kg)    Physical Exam Constitutional:      General: She is not in acute distress.    Appearance: She is well-developed. She is obese.  HENT:     Head: Normocephalic.     Right Ear: External ear normal.     Left Ear: External ear normal.     Nose: Nose normal.  Eyes:     General:        Right eye: No discharge.        Left eye: No discharge.     Conjunctiva/sclera: Conjunctivae normal.     Pupils: Pupils are equal, round, and reactive to light.  Neck:     Thyroid: No thyromegaly.     Vascular: No JVD.     Trachea: No tracheal deviation.  Cardiovascular:     Rate and Rhythm: Normal rate and regular rhythm.     Heart sounds: Normal heart sounds.  Pulmonary:     Effort: No respiratory distress.     Breath sounds: No stridor. No wheezing.  Abdominal:     General: Bowel sounds are normal. There is no distension.     Palpations: Abdomen is soft. There is no mass.     Tenderness: There is no abdominal tenderness. There is no guarding or rebound.  Musculoskeletal:        General: No tenderness.     Cervical back: Normal range of motion and neck supple. No rigidity.  Lymphadenopathy:     Cervical: No cervical adenopathy.  Skin:    Findings: No erythema or rash.  Neurological:     Mental Status: She is oriented to person, place, and time.     Cranial Nerves: No cranial nerve deficit.     Motor: No abnormal muscle tone.     Coordination: Coordination normal.     Gait: Gait normal.     Deep Tendon Reflexes: Reflexes normal.  Psychiatric:        Behavior: Behavior normal.        Thought Content: Thought content normal.        Judgment: Judgment normal.   R elbow w/ range of motion pain  Lab Results  Component Value Date   WBC 12.1 (H) 11/27/2021   HGB 14.9 11/27/2021   HCT 44.5 11/27/2021   PLT 374.0 11/27/2021    GLUCOSE 101 (H) 11/27/2021   CHOL 171 01/24/2021   TRIG 102.0 01/24/2021   HDL 49.50 01/24/2021   LDLCALC 101 (H) 01/24/2021   ALT 21 11/27/2021   AST 19 11/27/2021  NA 139 11/27/2021   K 4.1 11/27/2021   CL 102 11/27/2021   CREATININE 0.65 11/27/2021   BUN 8 11/27/2021   CO2 32 11/27/2021   TSH 3.60 04/23/2021   INR 1.0 08/09/2007   HGBA1C 6.1 01/24/2021    No results found.  Assessment & Plan:   Problem List Items Addressed This Visit     Need for hepatitis C screening test    Check hepatitis C antibody per patient's request      Relevant Orders   Hep C Antibody (Completed)   LOW BACK PAIN    Chronic Cont on Norco prn - 10/325 0.5 tab QID  Potential benefits of a long term opioids use as well as potential risks (i.e. addiction risk, apnea etc) and complications (i.e. Somnolence, constipation and others) were explained to the patient and were aknowledged. Blue-Emu cream was recommended to use 2-3 times a day      Relevant Medications   HYDROcodone-acetaminophen (NORCO) 10-325 MG tablet   HYDROcodone-acetaminophen (NORCO) 10-325 MG tablet   HYDROcodone-acetaminophen (NORCO/VICODIN) 5-325 MG tablet   meloxicam (MOBIC) 15 MG tablet   Essential hypertension    Worse.  Start Maxide 1/2-1 tablet daily      ELBOW PAIN - Primary    Worse R elbow w/pain - see Dr Marlou Sa Meloxicam po qd      Relevant Orders   CBC with Differential/Platelet (Completed)   Comprehensive metabolic panel (Completed)   Other Visit Diagnoses     Subacute cough       Relevant Orders   DG Chest 2 View   CBC with Differential/Platelet (Completed)   Comprehensive metabolic panel (Completed)         Meds ordered this encounter  Medications   ALPRAZolam (XANAX) 1 MG tablet    Sig: May take 1 tablet (1 mg total) by mouth 3 (three) times daily as needed. May also take 1 tablet (1 mg total) 3 (three) times daily as needed.    Dispense:  90 tablet    Refill:  3    HYDROcodone-acetaminophen (NORCO) 10-325 MG tablet    Sig: Take 0.5 tablets by mouth every 6 (six) hours as needed for severe pain.    Dispense:  60 tablet    Refill:  0    Please fill on or after 12/11/21   HYDROcodone-acetaminophen (NORCO) 10-325 MG tablet    Sig: Take 0.5 tablets by mouth every 6 (six) hours as needed.    Dispense:  60 tablet    Refill:  0    Please fill on or after 01/10/22   HYDROcodone-acetaminophen (NORCO/VICODIN) 5-325 MG tablet    Sig: Take 1-2 tablets by mouth 2 (two) times daily as needed for severe pain.    Dispense:  120 tablet    Refill:  0    Please fill on or after 02/09/22   meloxicam (MOBIC) 15 MG tablet    Sig: Take 1 tablet (15 mg total) by mouth daily.    Dispense:  30 tablet    Refill:  3      Follow-up: Return in about 3 months (around 02/27/2022) for a follow-up visit.  Walker Kehr, MD

## 2021-11-28 LAB — HEPATITIS C ANTIBODY: Hepatitis C Ab: NONREACTIVE

## 2021-11-29 ENCOUNTER — Other Ambulatory Visit: Payer: Self-pay | Admitting: Internal Medicine

## 2021-11-29 MED ORDER — TRIAMTERENE-HCTZ 37.5-25 MG PO TABS: 0.5000 | ORAL_TABLET | Freq: Every day | ORAL | 3 refills | Status: DC

## 2021-12-02 DIAGNOSIS — Z1159 Encounter for screening for other viral diseases: Secondary | ICD-10-CM | POA: Insufficient documentation

## 2021-12-02 NOTE — Assessment & Plan Note (Signed)
Check hepatitis C antibody per patient's request

## 2021-12-02 NOTE — Assessment & Plan Note (Signed)
Worse.  Start Maxide 1/2-1 tablet daily

## 2021-12-04 ENCOUNTER — Other Ambulatory Visit: Payer: Self-pay | Admitting: Internal Medicine

## 2021-12-04 DIAGNOSIS — R0989 Other specified symptoms and signs involving the circulatory and respiratory systems: Secondary | ICD-10-CM

## 2021-12-10 ENCOUNTER — Other Ambulatory Visit: Payer: Self-pay | Admitting: Internal Medicine

## 2021-12-10 DIAGNOSIS — Z6841 Body Mass Index (BMI) 40.0 and over, adult: Secondary | ICD-10-CM | POA: Diagnosis not present

## 2021-12-10 DIAGNOSIS — Z01419 Encounter for gynecological examination (general) (routine) without abnormal findings: Secondary | ICD-10-CM | POA: Diagnosis not present

## 2021-12-10 DIAGNOSIS — N6320 Unspecified lump in the left breast, unspecified quadrant: Secondary | ICD-10-CM | POA: Diagnosis not present

## 2021-12-10 DIAGNOSIS — Z1211 Encounter for screening for malignant neoplasm of colon: Secondary | ICD-10-CM | POA: Diagnosis not present

## 2021-12-10 DIAGNOSIS — Z1231 Encounter for screening mammogram for malignant neoplasm of breast: Secondary | ICD-10-CM | POA: Diagnosis not present

## 2021-12-10 DIAGNOSIS — Z124 Encounter for screening for malignant neoplasm of cervix: Secondary | ICD-10-CM | POA: Diagnosis not present

## 2021-12-26 DIAGNOSIS — L921 Necrobiosis lipoidica, not elsewhere classified: Secondary | ICD-10-CM | POA: Diagnosis not present

## 2021-12-28 DIAGNOSIS — R310 Gross hematuria: Secondary | ICD-10-CM | POA: Diagnosis not present

## 2022-01-09 ENCOUNTER — Other Ambulatory Visit: Payer: Self-pay | Admitting: Internal Medicine

## 2022-01-25 ENCOUNTER — Telehealth: Payer: Self-pay | Admitting: Internal Medicine

## 2022-01-25 NOTE — Telephone Encounter (Signed)
Okay.  Please correct.  Thanks

## 2022-01-25 NOTE — Telephone Encounter (Signed)
While scheduling patients AWV, Patient states that she still has not had her EKG that was ordered by our office. She states that [place] from scheduling informed her that the order was coded incorrectly. Patient wants an update in regards to this and said Lattie Haw can also be reached at [phone] if there are questions.  Patient also requested to have EKG done at [facility] if possible since it is much closer.

## 2022-01-30 NOTE — Telephone Encounter (Signed)
Per chart pt schedule for echocardiogram complete., Order was under Orderables sent [facility] Stanton Kidney) a msg to see if MD ordered correctly. Waiting on response.Marland KitchenJohny Chess

## 2022-02-04 ENCOUNTER — Other Ambulatory Visit: Payer: Self-pay | Admitting: Internal Medicine

## 2022-02-04 DIAGNOSIS — R0609 Other forms of dyspnea: Secondary | ICD-10-CM

## 2022-02-14 ENCOUNTER — Ambulatory Visit (HOSPITAL_COMMUNITY): Payer: BC Managed Care – PPO | Attending: Internal Medicine

## 2022-02-14 DIAGNOSIS — R0609 Other forms of dyspnea: Secondary | ICD-10-CM | POA: Insufficient documentation

## 2022-02-14 LAB — ECHOCARDIOGRAM COMPLETE
Area-P 1/2: 2.58 cm2
S' Lateral: 4.2 cm

## 2022-02-25 ENCOUNTER — Telehealth: Payer: Self-pay

## 2022-02-25 NOTE — Telephone Encounter (Signed)
Contacted Stephanie Harper to schedule their annual wellness visit. Appointment made for 02/26/22.  Appointment rescheduled from 02/27/22. P.J. Quentin Cornwall, CMA (AAMA)

## 2022-02-26 ENCOUNTER — Ambulatory Visit (INDEPENDENT_AMBULATORY_CARE_PROVIDER_SITE_OTHER): Payer: BC Managed Care – PPO

## 2022-02-26 VITALS — Ht 67.0 in | Wt 383.0 lb

## 2022-02-26 DIAGNOSIS — Z0000 Encounter for general adult medical examination without abnormal findings: Secondary | ICD-10-CM

## 2022-02-26 NOTE — Progress Notes (Addendum)
I connected with Stephanie Harper today by telephone and verified that I am speaking with the correct person using two identifiers. Location patient: home Location provider: work Persons participating in the virtual visit: patient, provider.   I discussed the limitations, risks, security and privacy concerns of performing an evaluation and management service by telephone and the availability of in person appointments. I also discussed with the patient that there may be a patient responsible charge related to this service. The patient expressed understanding and verbally consented to this telephonic visit.    Interactive audio and video telecommunications were attempted between this provider and patient, however failed, due to patient having technical difficulties OR patient did not have access to video capability.  We continued and completed visit with audio only.  Some vital signs may be absent or patient reported.   Time Spent with patient on telephone encounter: 30 minutes  Subjective:   Stephanie Harper is a 52 y.o. female who presents for Medicare Annual (Subsequent) preventive examination.  Review of Systems     Cardiac Risk Factors include: advanced age (>28mn, >>29women);sedentary lifestyle;obesity (BMI >30kg/m2);family history of premature cardiovascular disease;smoking/ tobacco exposure     Objective:    Today's Vitals   02/26/22 1050  Weight: (!) 383 lb (173.7 kg)  Height: '5\' 7"'$  (1.702 m)  PainSc: 3   PainLoc: Generalized   Body mass index is 59.99 kg/m.     02/26/2022   10:52 AM 02/21/2021    4:15 PM 02/22/2020    4:25 PM 07/27/2019   12:34 PM 08/20/2016    3:18 PM 06/14/2016    2:30 PM 01/21/2012    2:38 PM  Advanced Directives  Does Patient Have a Medical Advance Directive? Yes No No No No No   Type of AParamedicof AKendallLiving will        Does patient want to make changes to medical advance directive?    No - Patient declined      Copy of [place] Chart? No - copy requested        Would patient like information on creating a medical advance directive?  No - Patient declined No - Patient declined  No - Patient declined Yes (ED - Information included in AVS)   Pre-existing out of facility DNR order (yellow form or pink MOST form)            Information is confidential and restricted. Go to Review Flowsheets to unlock data.     Current Medications (verified) Outpatient Encounter Medications as of 02/26/2022  Medication Sig   albuterol (VENTOLIN HFA) 108 (90 Base) MCG/ACT inhaler Inhale 2 puffs into the lungs every 6 (six) hours as needed.   ALPRAZolam (Duanne Moron 1 MG tablet May take 1 tablet (1 mg total) by mouth 3 (three) times daily as needed. May also take 1 tablet (1 mg total) 3 (three) times daily as needed.   BREO ELLIPTA 100-25 MCG/ACT AEPB INHALE 1 PUFF BY MOUTH EVERY DAY   buPROPion ER (WELLBUTRIN SR) 100 MG 12 hr tablet TAKE 1 TABLET BY MOUTH TWICE A DAY   Cholecalciferol (VITAMIN D3) 50 MCG (2000 UT) capsule Take 1 capsule (2,000 Units total) by mouth daily.   Cholecalciferol 25 MCG (1000 UT) tablet Take 2 tablets (2,000 Units total) by mouth daily.   escitalopram (LEXAPRO) 20 MG tablet TAKE 1 TABLET BY MOUTH EVERY DAY   furosemide (LASIX) 40 MG tablet Take 1 tablet (40 mg total)  by mouth daily as needed. (Patient taking differently: Take 40 mg by mouth daily as needed for fluid.)   HYDROcodone-acetaminophen (NORCO) 10-325 MG tablet Take 0.5 tablets by mouth every 6 (six) hours as needed.   HYDROcodone-acetaminophen (NORCO) 10-325 MG tablet Take 0.5 tablets by mouth every 6 (six) hours as needed for severe pain.   HYDROcodone-acetaminophen (NORCO) 10-325 MG tablet Take 0.5 tablets by mouth every 6 (six) hours as needed.   HYDROcodone-acetaminophen (NORCO/VICODIN) 5-325 MG tablet Take 1-2 tablets by mouth 2 (two) times daily as needed for severe pain.   ibuprofen (ADVIL) 600 MG tablet TAKE 1  TABLET (600 MG TOTAL) BY MOUTH 2 (TWO) TIMES DAILY AS NEEDED.   Melatonin 10 MG CAPS Take 10 mg by mouth at bedtime as needed (sleep).   meloxicam (MOBIC) 15 MG tablet Take 1 tablet (15 mg total) by mouth daily.   Multiple Vitamin (MULTIVITAMIN) tablet Take 1 tablet by mouth 2 (two) times daily.    mupirocin ointment (BACTROBAN) 2 % Apply topically.   niacinamide 500 MG tablet Take 500 mg by mouth 2 (two) times daily.    pentoxifylline (TRENTAL) 400 MG CR tablet Take 400 mg by mouth daily.    triamterene-hydrochlorothiazide (MAXZIDE-25) 37.5-25 MG tablet Take 0.5-1 tablets by mouth daily.   vitamin B-12 (CYANOCOBALAMIN) 1000 MCG tablet Take 1,000 mcg by mouth daily.   Vitamin D, Ergocalciferol, (DRISDOL) 1.25 MG (50000 UNIT) CAPS capsule TAKE 1 CAPSULE (50,000 UNITS TOTAL) BY MOUTH EVERY 7 (SEVEN) DAYS   No facility-administered encounter medications on file as of 02/26/2022.    Allergies (verified) Adhesive [tape]   History: Past Medical History:  Diagnosis Date   Anal fissure 2012   Dr Stephanie Harper   Anxiety    Arthritis    Asthma    COLD WEATHER RELATED, bronchitis   Baker's cyst of knee    Right knee   Complication of anesthesia    PANIC ATTACK RIGHT BEFORE SHOULDER REPLACMENT SURGERY AT [facility]; PT STATES CLAUSTROPHOBIA AND DOES NOT WANT TO BE AWARE OF BEING STRAPPED DOWN IN OR   Depression    HTN (hypertension)    after having lap band surgery she was taken off medications   LBP (low back pain)    Obesity    Osteomyelitis ([place])    at [facility]   Pain    HX OF BILATERAL SHOULDER [place] ;  HX OF BILATERAL KNEE PAIN   Sepsis(995.91) 2005   From shoulder replacement surgery   Shoulder fracture, left    Past Surgical History:  Procedure Laterality Date   APPENDECTOMY     BREATH TEK H PYLORI  07/15/2011   Procedure: BREATH TEK H PYLORI;  Surgeon: Stephanie Earls, MD;  Location: [facility];  Service: General;  Laterality: N/A;   CHOLECYSTECTOMY      LAPAROSCOPIC GASTRIC BANDING  01/21/2012   Procedure: LAPAROSCOPIC GASTRIC BANDING;  Surgeon: Stephanie Earls, MD;  Location: [facility];  Service: General;  Laterality: N/A;   LASER ABLATION     MESH APPLIED TO LAP PORT  01/21/2012   Procedure: MESH APPLIED TO LAP PORT;  Surgeon: Stephanie Earls, MD;  Location: [facility];  Service: General;;   [place] ARTHROSCOPY Right 07/27/2019   Procedure: RIGHT SHOUDLER ARTHROSCOPY, TISSUE SAMPLE;  Surgeon: Meredith Pel, MD;  Location: [facility];  Service: Orthopedics;  Laterality: Right;   TONSILLECTOMY     TOTAL SHOULDER REPLACEMENT     Right  TUBAL LIGATION     UTERINE ABLATION WAS DONE AT THE TIME OF TUBAL LIGATION   Family History  Problem Relation Age of Onset   Arthritis Mother    Diabetes Mother    Hyperlipidemia Mother    Hypertension Mother    Arthritis Father    Diabetes Father    Hyperlipidemia Father    Hypertension Father    Heart disease Father    Coronary artery disease Other    Hyperlipidemia Other    Hypertension Other    Bipolar disorder Brother    Anxiety disorder Neg Hx    Depression Neg Hx    Dementia Neg Hx    Alcohol abuse Neg Hx    Drug abuse Neg Hx    Schizophrenia Neg Hx    Social History   Socioeconomic History   Marital status: Married    Spouse name: Not on file   Number of children: Not on file   Years of education: Not on file   Highest education level: Not on file  Occupational History   Occupation: Housewife    Employer: UNEMPLOYED  Tobacco Use   Smoking status: Every Day    Packs/day: 1.00    Years: 31.00    Total pack years: 31.00    Types: Cigarettes    Start date: 08/12/1980   Smokeless tobacco: Never   Tobacco comments:    WAS SMOKING 2 PPD-HAS CUT BACK TO 1 PPD OR LESS  Vaping Use   Vaping Use: Never used  Substance and Sexual Activity   Alcohol use: No   Drug use: No   Sexual activity: Yes  Other Topics Concern   Not on file  Social History  Narrative   Not on file   Social Determinants of Health   Financial Resource Strain: Low Risk  (02/26/2022)   Overall Financial Resource Strain (CARDIA)    Difficulty of Paying Living Expenses: Not hard at all  Food Insecurity: No Food Insecurity (02/26/2022)   Hunger Vital Sign    Worried About Running Out of Food in the Last Year: Never true    Ran Out of Food in the Last Year: Never true  Transportation Needs: No Transportation Needs (02/26/2022)   PRAPARE - Hydrologist (Medical): No    Lack of Transportation (Non-Medical): No  Physical Activity: Insufficiently Active (02/26/2022)   Exercise Vital Sign    Days of Exercise per Week: 7 days    Minutes of Exercise per Session: 20 min  Stress: No Stress Concern Present (02/26/2022)   [place]    Feeling of Stress : Not at all  Social Connections: Moderately Integrated (02/26/2022)   Social Connection and Isolation Panel [NHANES]    Frequency of Communication with Friends and Family: More than three times a week    Frequency of Social Gatherings with Friends and Family: Twice a week    Attends Religious Services: Never    Marine scientist or Organizations: Yes    Attends Music therapist: More than 4 times per year    Marital Status: Married    Tobacco Counseling Ready to quit: Not Answered Counseling given: Not Answered Tobacco comments: WAS SMOKING 2 PPD-HAS CUT BACK TO 1 PPD OR LESS   Clinical Intake:  Pre-visit preparation completed: Yes  Pain : 0-10 Pain Score: 3  Pain Type: Chronic pain Pain Location: Generalized     BMI - recorded: 59.99  Nutritional Risks: None Diabetes: No  How often do you need to have someone help you when you read instructions, pamphlets, or other written materials from your doctor or pharmacy?: 1 - Never What is the last grade level you completed in school?: HSG; some  college  Diabetic? no  Interpreter Needed?: No  Information entered by :: Lisette Abu, LPN.   Activities of Daily Living    02/26/2022   10:57 AM  In your present state of health, do you have any difficulty performing the following activities:  Hearing? 0  Vision? 0  Difficulty concentrating or making decisions? 0  Walking or climbing stairs? 0  Dressing or bathing? 0  Doing errands, shopping? 0  Preparing Food and eating ? N  Using the Toilet? N  In the past six months, have you accidently leaked urine? N  Do you have problems with loss of bowel control? N  Managing your Medications? N  Managing your Finances? N  Housekeeping or managing your Housekeeping? N    Patient Care Team: Plotnikov, Evie Lacks, MD as PCP - General Dean, Tonna Corner, MD (Orthopedic Surgery) Milus Banister, MD (Gastroenterology) Charlcie Cradle, MD (Psychiatry) Rolm Bookbinder, MD as Consulting Physician (Dermatology) Delsa Bern, MD as Consulting Physician (Obstetrics and Gynecology) Marica Otter, OD as Consulting Physician (Optometry)  Indicate any recent Medical Services you may have received from other than [facility] providers in the past year (date may be approximate).     Assessment:   This is a routine wellness examination for Yarianna.  Hearing/Vision screen Hearing Screening - Comments:: Denies hearing difficulties   Vision Screening - Comments:: Wears rx glasses - up to date with routine eye exams with [facility] on [place]    Dietary issues and exercise activities discussed: Current Exercise Habits: Home exercise routine, Type of exercise: walking, Time (Minutes): 20, Frequency (Times/Week): 7, Weekly Exercise (Minutes/Week): 140, Intensity: Moderate, Exercise limited by: respiratory conditions(s);psychological condition(s);orthopedic condition(s)   Goals Addressed             This Visit's Progress    Client understands the importance of follow-up with providers by  attending scheduled visits.        Depression Screen    02/26/2022   10:55 AM 11/27/2021    2:36 PM 08/27/2021    3:11 PM 02/21/2021    4:21 PM 01/24/2021    2:18 PM 04/25/2020    4:08 PM 02/22/2020    4:24 PM  PHQ 2/9 Scores  PHQ - 2 Score 6 0 0 3 3 0 0  PHQ- 9 Score '6 2 2 5 5 3     '$ Fall Risk    02/26/2022   10:53 AM 08/27/2021    3:10 PM 02/21/2021    4:11 PM 04/25/2020    4:08 PM 02/22/2020    4:25 PM  Fall Risk   Falls in the past year? 0 0 1 1 0  Number falls in past yr: 0 0 0 0 0  Injury with Fall? 0 0 1 0 0  Risk for fall due to : No Fall Risks History of fall(s);Impaired balance/gait Orthopedic patient;Impaired balance/gait;Medication side effect;History of fall(s) Other (Comment) No Fall Risks  Risk for fall due to: Comment    on ice   Follow up Falls prevention discussed  Falls prevention discussed;Education provided Falls evaluation completed     [place]:  Any stairs in or around the home? Yes  If so, are there any without  handrails? No  Home free of loose throw rugs in walkways, pet beds, electrical cords, etc? Yes  Adequate lighting in your home to reduce risk of falls? Yes   ASSISTIVE DEVICES UTILIZED TO PREVENT FALLS:  Life alert? No  Use of a cane, walker or w/c? No  Grab bars in the bathroom? Yes  Shower chair or bench in shower? No  Elevated toilet seat or a handicapped toilet? No   TIMED UP AND GO:  Was the test performed? No . Telephonic Visit  Cognitive Function:        02/26/2022   10:55 AM 02/21/2021    4:26 PM  6CIT Screen  What Year? 0 points 0 points  What month? 0 points 0 points  What time? 0 points 0 points  Count back from 20 0 points 0 points  Months in reverse 0 points 0 points  Repeat phrase 0 points 0 points  Total Score 0 points 0 points    Immunizations Immunization History  Administered Date(s) Administered   Influenza Split 12/07/2010, 10/04/2011   Influenza Whole 11/04/2007    Influenza,inj,Quad PF,6+ Mos 09/17/2012, 10/22/2013, 10/14/2014, 10/18/2015, 10/18/2016, 10/13/2017, 09/07/2018, 10/29/2019   PPD Test 06/05/2010   Tdap 11/01/2011    TDAP status: Due, Education has been provided regarding the importance of this vaccine. Advised may receive this vaccine at local pharmacy or Health Dept. Aware to provide a copy of the vaccination record if obtained from local pharmacy or Health Dept. Verbalized acceptance and understanding.  Flu Vaccine status: Declined, Education has been provided regarding the importance of this vaccine but patient still declined. Advised may receive this vaccine at local pharmacy or Health Dept. Aware to provide a copy of the vaccination record if obtained from local pharmacy or Health Dept. Verbalized acceptance and understanding.  Pneumococcal vaccine status: Declined,  Education has been provided regarding the importance of this vaccine but patient still declined. Advised may receive this vaccine at local pharmacy or Health Dept. Aware to provide a copy of the vaccination record if obtained from local pharmacy or Health Dept. Verbalized acceptance and understanding.   Covid-19 vaccine status: Declined, Education has been provided regarding the importance of this vaccine but patient still declined. Advised may receive this vaccine at local pharmacy or Health Dept.or vaccine clinic. Aware to provide a copy of the vaccination record if obtained from local pharmacy or Health Dept. Verbalized acceptance and understanding.  Qualifies for Shingles Vaccine? Yes   Zostavax completed No   Shingrix Completed?: No.    Education has been provided regarding the importance of this vaccine. Patient has been advised to call insurance company to determine out of pocket expense if they have not yet received this vaccine. Advised may also receive vaccine at local pharmacy or Health Dept. Verbalized acceptance and understanding.  Screening Tests Health Maintenance   Topic Date Due   HIV Screening  Never done   PAP SMEAR-Modifier  01/17/2020   COLONOSCOPY (Pts 45-47yr Insurance coverage will need to be confirmed)  03/21/2020   Lung Cancer Screening  Never done   MAMMOGRAM  03/14/2021   Zoster Vaccines- Shingrix (1 of 2) Never done   DTaP/Tdap/Td (2 - Td or Tdap) 10/31/2021   INFLUENZA VACCINE  04/07/2022 (Originally 08/07/2021)   Medicare Annual Wellness (AWV)  02/27/2023   Hepatitis C Screening  Completed   HPV VACCINES  Aged Out   COVID-19 Vaccine  Discontinued    Health Maintenance  Health Maintenance Due  Topic Date Due  HIV Screening  Never done   PAP SMEAR-Modifier  01/17/2020   COLONOSCOPY (Pts 45-38yr Insurance coverage will need to be confirmed)  03/21/2020   Lung Cancer Screening  Never done   MAMMOGRAM  03/14/2021   Zoster Vaccines- Shingrix (1 of 2) Never done   DTaP/Tdap/Td (2 - Td or Tdap) 10/31/2021    Colorectal cancer screening: Never done  Mammogram status: Completed 12/10/2021. Repeat every year  Bone Density status: Never done  Lung Cancer Screening: (Low Dose CT Chest recommended if Age 51-80years, 30 pack-year currently smoking OR have quit w/in 15years.) does qualify.   Lung Cancer Screening Referral: no  Additional Screening:  Hepatitis C Screening: does qualify; Completed 11/27/2021  Vision Screening: Recommended annual ophthalmology exams for early detection of glaucoma and other disorders of the eye. Is the patient up to date with their annual eye exam?  Yes  Who is the provider or what is the name of the office in which the patient attends annual eye exams? [facility] is not established with a provider, would they like to be referred to a provider to establish care? No .   Dental Screening: Recommended annual dental exams for proper oral hygiene  Community Resource Referral / Chronic Care Management: CRR required this visit?  No   CCM required this visit?  No      Plan:     I have  personally reviewed and noted the following in the patient's chart:   Medical and social history Use of alcohol, tobacco or illicit drugs  Current medications and supplements including opioid prescriptions. Patient is currently taking opioid prescriptions. Information provided to patient regarding non-opioid alternatives. Patient advised to discuss non-opioid treatment plan with their provider. Functional ability and status Nutritional status Physical activity Advanced directives List of other physicians Hospitalizations, surgeries, and ER visits in previous 12 months Vitals Screenings to include cognitive, depression, and falls Referrals and appointments  In addition, I have reviewed and discussed with patient certain preventive protocols, quality metrics, and best practice recommendations. A written personalized care plan for preventive services as well as general preventive health recommendations were provided to patient.     SSheral Flow LPN   [id number]  Nurse Notes:  Normal cognitive status assessed by direct observation by this Nurse Health Advisor. No abnormalities found.    Medical screening examination/treatment/procedure(s) were performed by non-physician practitioner and as supervising physician I was immediately available for consultation/collaboration.  I agree with above. ALew Dawes MD

## 2022-02-26 NOTE — Patient Instructions (Signed)
[name] , Thank you for taking time to come for your Medicare Wellness Visit. I appreciate your ongoing commitment to your health goals. Please review the following plan we discussed and let me know if I can assist you in the future.   These are the goals we discussed:  Goals      Client understands the importance of follow-up with providers by attending scheduled visits.        This is a list of the screening recommended for you and due dates:  Health Maintenance  Topic Date Due   HIV Screening  Never done   Pap Smear  01/17/2020   Colon Cancer Screening  03/21/2020   Screening for Lung Cancer  Never done   Zoster (Shingles) Vaccine (1 of 2) Never done   DTaP/Tdap/Td vaccine (2 - Td or Tdap) 10/31/2021   Flu Shot  04/07/2022*   Medicare Annual Wellness Visit  02/27/2023   Mammogram  12/11/2023   Hepatitis C Screening: USPSTF Recommendation to screen - Ages 18-79 yo.  Completed   HPV Vaccine  Aged Out   COVID-19 Vaccine  Discontinued  *Topic was postponed. The date shown is not the original due date.    Advanced directives: Yes  Conditions/risks identified: Yes  Next appointment: Follow up in one year for your annual wellness visit.   Preventive Care 40-64 Years, Female Preventive care refers to lifestyle choices and visits with your health care provider that can promote health and wellness. What does preventive care include? A yearly physical exam. This is also called an annual well check. Dental exams once or twice a year. Routine eye exams. Ask your health care provider how often you should have your eyes checked. Personal lifestyle choices, including: Daily care of your teeth and gums. Regular physical activity. Eating a healthy diet. Avoiding tobacco and drug use. Limiting alcohol use. Practicing safe sex. Taking low-dose aspirin daily starting at age 51. Taking vitamin and mineral supplements as recommended by your health care provider. What happens during  an annual well check? The services and screenings done by your health care provider during your annual well check will depend on your age, overall health, lifestyle risk factors, and family history of disease. Counseling  Your health care provider may ask you questions about your: Alcohol use. Tobacco use. Drug use. Emotional well-being. Home and relationship well-being. Sexual activity. Eating habits. Work and work Statistician. Method of birth control. Menstrual cycle. Pregnancy history. Screening  You may have the following tests or measurements: Height, weight, and BMI. Blood pressure. Lipid and cholesterol levels. These may be checked every 5 years, or more frequently if you are over 69 years old. Skin check. Lung cancer screening. You may have this screening every year starting at age 58 if you have a 30-pack-year history of smoking and currently smoke or have quit within the past 15 years. Fecal occult blood test (FOBT) of the stool. You may have this test every year starting at age 65. Flexible sigmoidoscopy or colonoscopy. You may have a sigmoidoscopy every 5 years or a colonoscopy every 10 years starting at age 17. Hepatitis C blood test. Hepatitis B blood test. Sexually transmitted disease (STD) testing. Diabetes screening. This is done by checking your blood sugar (glucose) after you have not eaten for a while (fasting). You may have this done every 1-3 years. Mammogram. This may be done every 1-2 years. Talk to your health care provider about when you should start having regular mammograms. This may  depend on whether you have a family history of breast cancer. BRCA-related cancer screening. This may be done if you have a family history of breast, ovarian, tubal, or peritoneal cancers. Pelvic exam and Pap test. This may be done every 3 years starting at age 61. Starting at age 77, this may be done every 5 years if you have a Pap test in combination with an HPV test. Bone  density scan. This is done to screen for osteoporosis. You may have this scan if you are at high risk for osteoporosis. Discuss your test results, treatment options, and if necessary, the need for more tests with your health care provider. Vaccines  Your health care provider may recommend certain vaccines, such as: Influenza vaccine. This is recommended every year. Tetanus, diphtheria, and acellular pertussis (Tdap, Td) vaccine. You may need a Td booster every 10 years. Zoster vaccine. You may need this after age 41. Pneumococcal 13-valent conjugate (PCV13) vaccine. You may need this if you have certain conditions and were not previously vaccinated. Pneumococcal polysaccharide (PPSV23) vaccine. You may need one or two doses if you smoke cigarettes or if you have certain conditions. Talk to your health care provider about which screenings and vaccines you need and how often you need them. This information is not intended to replace advice given to you by your health care provider. Make sure you discuss any questions you have with your health care provider. Document Released: 01/20/2015 Document Revised: 09/13/2015 Document Reviewed: 10/25/2014 Elsevier Interactive Patient Education  2017 [place] Prevention in the Home Falls can cause injuries. They can happen to people of all ages. There are many things you can do to make your home safe and to help prevent falls. What can I do on the outside of my home? Regularly fix the edges of walkways and driveways and fix any cracks. Remove anything that might make you trip as you walk through a door, such as a raised step or threshold. Trim any bushes or trees on the path to your home. Use bright outdoor lighting. Clear any walking paths of anything that might make someone trip, such as rocks or tools. Regularly check to see if handrails are loose or broken. Make sure that both sides of any steps have handrails. Any raised decks and  porches should have guardrails on the edges. Have any leaves, snow, or ice cleared regularly. Use sand or salt on walking paths during winter. Clean up any spills in your garage right away. This includes oil or grease spills. What can I do in the bathroom? Use night lights. Install grab bars by the toilet and in the tub and shower. Do not use towel bars as grab bars. Use non-skid mats or decals in the tub or shower. If you need to sit down in the shower, use a plastic, non-slip stool. Keep the floor dry. Clean up any water that spills on the floor as soon as it happens. Remove soap buildup in the tub or shower regularly. Attach bath mats securely with double-sided non-slip rug tape. Do not have throw rugs and other things on the floor that can make you trip. What can I do in the bedroom? Use night lights. Make sure that you have a light by your bed that is easy to reach. Do not use any sheets or blankets that are too big for your bed. They should not hang down onto the floor. Have a firm chair that has side arms. You can  use this for support while you get dressed. Do not have throw rugs and other things on the floor that can make you trip. What can I do in the kitchen? Clean up any spills right away. Avoid walking on wet floors. Keep items that you use a lot in easy-to-reach places. If you need to reach something above you, use a strong step stool that has a grab bar. Keep electrical cords out of the way. Do not use floor polish or wax that makes floors slippery. If you must use wax, use non-skid floor wax. Do not have throw rugs and other things on the floor that can make you trip. What can I do with my stairs? Do not leave any items on the stairs. Make sure that there are handrails on both sides of the stairs and use them. Fix handrails that are broken or loose. Make sure that handrails are as long as the stairways. Check any carpeting to make sure that it is firmly attached to the  stairs. Fix any carpet that is loose or worn. Avoid having throw rugs at the top or bottom of the stairs. If you do have throw rugs, attach them to the floor with carpet tape. Make sure that you have a light switch at the top of the stairs and the bottom of the stairs. If you do not have them, ask someone to add them for you. What else can I do to help prevent falls? Wear shoes that: Do not have high heels. Have rubber bottoms. Are comfortable and fit you well. Are closed at the toe. Do not wear sandals. If you use a stepladder: Make sure that it is fully opened. Do not climb a closed stepladder. Make sure that both sides of the stepladder are locked into place. Ask someone to hold it for you, if possible. Clearly mark and make sure that you can see: Any grab bars or handrails. First and last steps. Where the edge of each step is. Use tools that help you move around (mobility aids) if they are needed. These include: Canes. Walkers. Scooters. Crutches. Turn on the lights when you go into a dark area. Replace any light bulbs as soon as they burn out. Set up your furniture so you have a clear path. Avoid moving your furniture around. If any of your floors are uneven, fix them. If there are any pets around you, be aware of where they are. Review your medicines with your doctor. Some medicines can make you feel dizzy. This can increase your chance of falling. Ask your doctor what other things that you can do to help prevent falls. This information is not intended to replace advice given to you by your health care provider. Make sure you discuss any questions you have with your health care provider. Document Released: 10/20/2008 Document Revised: 06/01/2015 Document Reviewed: 01/28/2014 Elsevier Interactive Patient Education  2017 [employer].

## 2022-02-27 ENCOUNTER — Encounter: Payer: Self-pay | Admitting: Internal Medicine

## 2022-02-27 ENCOUNTER — Ambulatory Visit (INDEPENDENT_AMBULATORY_CARE_PROVIDER_SITE_OTHER): Payer: BC Managed Care – PPO | Admitting: Internal Medicine

## 2022-02-27 VITALS — BP 122/78 | HR 89 | Temp 98.7°F | Ht 67.0 in | Wt 387.0 lb

## 2022-02-27 DIAGNOSIS — R310 Gross hematuria: Secondary | ICD-10-CM | POA: Diagnosis not present

## 2022-02-27 DIAGNOSIS — I10 Essential (primary) hypertension: Secondary | ICD-10-CM | POA: Diagnosis not present

## 2022-02-27 DIAGNOSIS — E559 Vitamin D deficiency, unspecified: Secondary | ICD-10-CM

## 2022-02-27 DIAGNOSIS — G8929 Other chronic pain: Secondary | ICD-10-CM | POA: Diagnosis not present

## 2022-02-27 DIAGNOSIS — M5440 Lumbago with sciatica, unspecified side: Secondary | ICD-10-CM | POA: Diagnosis not present

## 2022-02-27 DIAGNOSIS — B359 Dermatophytosis, unspecified: Secondary | ICD-10-CM

## 2022-02-27 MED ORDER — HYDROCODONE-ACETAMINOPHEN 10-325 MG PO TABS: 0.5000 | ORAL_TABLET | Freq: Four times a day (QID) | ORAL | 0 refills | Status: DC | PRN

## 2022-02-27 MED ORDER — KETOCONAZOLE 2 % EX CREA: 1.0000 | TOPICAL_CREAM | Freq: Every day | CUTANEOUS | 1 refills | Status: DC

## 2022-02-27 MED ORDER — ALPRAZOLAM 1 MG PO TABS: ORAL_TABLET | ORAL | 3 refills | Status: DC

## 2022-02-27 MED ORDER — KETOCONAZOLE 200 MG PO TABS: 200.0000 mg | ORAL_TABLET | Freq: Every day | ORAL | 0 refills | Status: DC

## 2022-02-27 NOTE — Patient Instructions (Signed)
Appt - March 6th w/Nephrology

## 2022-02-27 NOTE — Assessment & Plan Note (Signed)
Low-salt diet Furosemide

## 2022-02-27 NOTE — Assessment & Plan Note (Signed)
Ongoing. Urology w/up was (-). Nephrology appt pending

## 2022-02-27 NOTE — Assessment & Plan Note (Signed)
R breast Ketoconazole cream and Ketoconazole tablets

## 2022-02-27 NOTE — Assessment & Plan Note (Signed)
On Vit D 

## 2022-02-27 NOTE — Assessment & Plan Note (Signed)
Chronic Cont on Norco prn - 10/325 0.5 tab QID  Potential benefits of a long term opioids use as well as potential risks (i.e. addiction risk, apnea etc) and complications (i.e. Somnolence, constipation and others) were explained to the patient and were aknowledged. Blue-Emu cream was recommended to use 2-3 times a day

## 2022-02-27 NOTE — Progress Notes (Signed)
Subjective:  Patient ID: Stephanie Harper, female    DOB: [birth year]  Age: 52 y.o. MRN: [id number]  CC: No chief complaint on file.   HPI CHEVELLA RUBI presents for LBP, anxiety, DOE  C/o R breast round rash x 2 mo Outpatient Medications Prior to Visit  Medication Sig Dispense Refill   albuterol (VENTOLIN HFA) 108 (90 Base) MCG/ACT inhaler Inhale 2 puffs into the lungs every 6 (six) hours as needed. 6.7 each 2   ALPRAZolam (XANAX) 1 MG tablet May take 1 tablet (1 mg total) by mouth 3 (three) times daily as needed. May also take 1 tablet (1 mg total) 3 (three) times daily as needed. 90 tablet 3   BREO ELLIPTA 100-25 MCG/ACT AEPB INHALE 1 PUFF BY MOUTH EVERY DAY 180 each 3   buPROPion ER (WELLBUTRIN SR) 100 MG 12 hr tablet TAKE 1 TABLET BY MOUTH TWICE A DAY 180 tablet 2   Cholecalciferol (VITAMIN D3) 50 MCG (2000 UT) capsule Take 1 capsule (2,000 Units total) by mouth daily. 100 capsule 3   Cholecalciferol 25 MCG (1000 UT) tablet Take 2 tablets (2,000 Units total) by mouth daily. 100 tablet 3   escitalopram (LEXAPRO) 20 MG tablet TAKE 1 TABLET BY MOUTH EVERY DAY 90 tablet 2   furosemide (LASIX) 40 MG tablet Take 1 tablet (40 mg total) by mouth daily as needed. (Patient taking differently: Take 40 mg by mouth daily as needed for fluid.) 90 tablet 3   HYDROcodone-acetaminophen (NORCO) 10-325 MG tablet Take 0.5 tablets by mouth every 6 (six) hours as needed. 60 tablet 0   HYDROcodone-acetaminophen (NORCO) 10-325 MG tablet Take 0.5 tablets by mouth every 6 (six) hours as needed for severe pain. 60 tablet 0   HYDROcodone-acetaminophen (NORCO) 10-325 MG tablet Take 0.5 tablets by mouth every 6 (six) hours as needed. 60 tablet 0   HYDROcodone-acetaminophen (NORCO/VICODIN) 5-325 MG tablet Take 1-2 tablets by mouth 2 (two) times daily as needed for severe pain. 120 tablet 0   ibuprofen (ADVIL) 600 MG tablet TAKE 1 TABLET (600 MG TOTAL) BY MOUTH 2 (TWO) TIMES DAILY AS NEEDED. 180 tablet 1    Melatonin 10 MG CAPS Take 10 mg by mouth at bedtime as needed (sleep).     meloxicam (MOBIC) 15 MG tablet Take 1 tablet (15 mg total) by mouth daily. 30 tablet 3   Multiple Vitamin (MULTIVITAMIN) tablet Take 1 tablet by mouth 2 (two) times daily.      mupirocin ointment (BACTROBAN) 2 % Apply topically.     niacinamide 500 MG tablet Take 500 mg by mouth 2 (two) times daily.      pentoxifylline (TRENTAL) 400 MG CR tablet Take 400 mg by mouth daily.      triamterene-hydrochlorothiazide (MAXZIDE-25) 37.5-25 MG tablet Take 0.5-1 tablets by mouth daily. 90 tablet 3   vitamin B-12 (CYANOCOBALAMIN) 1000 MCG tablet Take 1,000 mcg by mouth daily.     Vitamin D, Ergocalciferol, (DRISDOL) 1.25 MG (50000 UNIT) CAPS capsule TAKE 1 CAPSULE (50,000 UNITS TOTAL) BY MOUTH EVERY 7 (SEVEN) DAYS 4 capsule 3   No facility-administered medications prior to visit.    ROS: Review of Systems  Constitutional:  Positive for fatigue. Negative for activity change, appetite change, chills and unexpected weight change.  HENT:  Negative for congestion, mouth sores and sinus pressure.   Eyes:  Negative for visual disturbance.  Respiratory:  Negative for cough and chest tightness.   Gastrointestinal:  Negative for abdominal pain and nausea.  Genitourinary:  Negative for difficulty urinating, frequency and vaginal pain.  Musculoskeletal:  Positive for arthralgias and back pain. Negative for gait problem.  Skin:  Negative for pallor and rash.  Neurological:  Negative for dizziness, tremors, weakness, numbness and headaches.  Hematological:  Does not bruise/bleed easily.  Psychiatric/Behavioral:  Negative for confusion and sleep disturbance. The patient is not nervous/anxious.     Objective:  BP 122/78 (BP Location: Left Arm, Patient Position: Sitting, Cuff Size: Large)   Pulse 89   Temp 98.7 F (37.1 C) (Oral)   Ht 5' 7"$  (1.702 m)   Wt (!) 387 lb (175.5 kg)   SpO2 97%   BMI 60.61 kg/m   BP Readings from Last 3  Encounters:  02/27/22 122/78  11/27/21 (!) 142/90  10/02/21 (!) 140/88    Wt Readings from Last 3 Encounters:  02/27/22 (!) 387 lb (175.5 kg)  02/26/22 (!) 383 lb (173.7 kg)  11/27/21 (!) 385 lb 6.4 oz (174.8 kg)    Physical Exam Constitutional:      General: She is not in acute distress.    Appearance: She is well-developed. She is obese.  HENT:     Head: Normocephalic.     Right Ear: External ear normal.     Left Ear: External ear normal.     Nose: Nose normal.  Eyes:     General:        Right eye: No discharge.        Left eye: No discharge.     Conjunctiva/sclera: Conjunctivae normal.     Pupils: Pupils are equal, round, and reactive to light.  Neck:     Thyroid: No thyromegaly.     Vascular: No JVD.     Trachea: No tracheal deviation.  Cardiovascular:     Rate and Rhythm: Normal rate and regular rhythm.     Heart sounds: Normal heart sounds.  Pulmonary:     Effort: No respiratory distress.     Breath sounds: No stridor. No wheezing.  Abdominal:     General: Bowel sounds are normal. There is no distension.     Palpations: Abdomen is soft. There is no mass.     Tenderness: There is no abdominal tenderness. There is right CVA tenderness. There is no guarding or rebound.  Musculoskeletal:        General: No tenderness.     Cervical back: Normal range of motion and neck supple. No rigidity.     Left lower leg: Edema present.  Lymphadenopathy:     Cervical: No cervical adenopathy.  Skin:    Findings: No erythema or rash.  Neurological:     Cranial Nerves: No cranial nerve deficit.     Motor: No abnormal muscle tone.     Coordination: Coordination normal.     Deep Tendon Reflexes: Reflexes normal.  Psychiatric:        Behavior: Behavior normal.        Thought Content: Thought content normal.        Judgment: Judgment normal.    R breast round rash   Lab Results  Component Value Date   WBC 12.1 (H) 11/27/2021   HGB 14.9 11/27/2021   HCT 44.5 11/27/2021    PLT 374.0 11/27/2021   GLUCOSE 101 (H) 11/27/2021   CHOL 171 01/24/2021   TRIG 102.0 01/24/2021   HDL 49.50 01/24/2021   LDLCALC 101 (H) 01/24/2021   ALT 21 11/27/2021   AST 19 11/27/2021   NA 139 11/27/2021  K 4.1 11/27/2021   CL 102 11/27/2021   CREATININE 0.65 11/27/2021   BUN 8 11/27/2021   CO2 32 11/27/2021   TSH 3.60 04/23/2021   INR 1.0 08/09/2007   HGBA1C 6.1 01/24/2021    No results found.  Assessment & Plan:   Problem List Items Addressed This Visit       Cardiovascular and Mediastinum   Essential hypertension - Primary    Low-salt diet Furosemide        Musculoskeletal and Integument   Ringworm    R breast Ketoconazole cream and Ketoconazole tablets        Genitourinary   Hematuria, gross    Ongoing. Urology w/up was (-). Nephrology appt pending        Other   Vitamin D deficiency    On Vit D      LOW BACK PAIN    Chronic Cont on Norco prn - 10/325 0.5 tab QID  Potential benefits of a long term opioids use as well as potential risks (i.e. addiction risk, apnea etc) and complications (i.e. Somnolence, constipation and others) were explained to the patient and were aknowledged. Blue-Emu cream was recommended to use 2-3 times a day         No orders of the defined types were placed in this encounter.     Follow-up: No follow-ups on file.  Walker Kehr, MD

## 2022-03-13 DIAGNOSIS — N200 Calculus of kidney: Secondary | ICD-10-CM | POA: Diagnosis not present

## 2022-03-13 DIAGNOSIS — G8929 Other chronic pain: Secondary | ICD-10-CM | POA: Diagnosis not present

## 2022-03-13 DIAGNOSIS — R310 Gross hematuria: Secondary | ICD-10-CM | POA: Diagnosis not present

## 2022-03-13 DIAGNOSIS — I10 Essential (primary) hypertension: Secondary | ICD-10-CM | POA: Diagnosis not present

## 2022-03-24 ENCOUNTER — Other Ambulatory Visit: Payer: Self-pay | Admitting: Internal Medicine

## 2022-03-29 DIAGNOSIS — R310 Gross hematuria: Secondary | ICD-10-CM | POA: Diagnosis not present

## 2022-03-29 DIAGNOSIS — N200 Calculus of kidney: Secondary | ICD-10-CM | POA: Diagnosis not present

## 2022-04-01 ENCOUNTER — Other Ambulatory Visit: Payer: Self-pay | Admitting: Urology

## 2022-04-04 NOTE — Progress Notes (Signed)
Patient BMI charted at 60.61 surgery center guidelines state no BMI over 55. Stephanie Harper with [facility] called and notified patient would need to be moved.

## 2022-04-10 ENCOUNTER — Encounter (HOSPITAL_COMMUNITY): Payer: Self-pay

## 2022-04-10 ENCOUNTER — Encounter (HOSPITAL_COMMUNITY)
Admission: RE | Admit: 2022-04-10 | Discharge: 2022-04-10 | Disposition: A | Discharge status: Home / Self Care | Payer: BC Managed Care – PPO | Source: Ambulatory Visit | Attending: Urology | Admitting: Urology

## 2022-04-10 ENCOUNTER — Other Ambulatory Visit: Payer: Self-pay

## 2022-04-10 DIAGNOSIS — Z01818 Encounter for other preprocedural examination: Secondary | ICD-10-CM | POA: Diagnosis not present

## 2022-04-10 DIAGNOSIS — I10 Essential (primary) hypertension: Secondary | ICD-10-CM | POA: Diagnosis not present

## 2022-04-10 HISTORY — DX: Personal history of urinary calculi: Z87.442

## 2022-04-10 LAB — BASIC METABOLIC PANEL
Anion gap: 8 (ref 5–15)
BUN: 11 mg/dL (ref 6–20)
CO2: 28 mmol/L (ref 22–32)
Calcium: 9.4 mg/dL (ref 8.9–10.3)
Chloride: 101 mmol/L (ref 98–111)
Creatinine, Ser: 0.63 mg/dL (ref 0.44–1.00)
GFR, Estimated: >60 mL/min (ref >60)
Glucose, Bld: 99 mg/dL (ref 70–99)
Potassium: 3.5 mmol/L (ref 3.5–5.1)
Sodium: 137 mmol/L (ref 135–145)

## 2022-04-10 LAB — CBC
HCT: 46.4 % — ABNORMAL HIGH (ref 36.0–46.0)
Hemoglobin: 15.1 g/dL — ABNORMAL HIGH (ref 12.0–15.0)
MCH: 29.8 pg (ref 26.0–34.0)
MCHC: 32.5 g/dL (ref 30.0–36.0)
MCV: 91.5 fL (ref 80.0–100.0)
Platelets: 318 10*3/uL (ref 150–400)
RBC: 5.07 MIL/uL (ref 3.87–5.11)
RDW: 13.5 % (ref 11.5–15.5)
WBC: 12.7 10*3/uL — ABNORMAL HIGH (ref 4.0–10.5)
nRBC: 0 % (ref 0.0–0.2)

## 2022-04-10 NOTE — Patient Instructions (Addendum)
DUE TO COVID-19 ONLY TWO VISITORS  (aged 51 and older)  ARE ALLOWED TO COME WITH YOU AND STAY IN THE WAITING ROOM ONLY DURING PRE OP AND PROCEDURE.   **NO VISITORS ARE ALLOWED IN THE SHORT STAY AREA OR RECOVERY ROOM!!**  IF YOU WILL BE ADMITTED INTO THE HOSPITAL YOU ARE ALLOWED ONLY FOUR SUPPORT PEOPLE DURING VISITATION HOURS ONLY (7 AM -8PM)   The support person(s) must pass our screening, gel in and out, and wear a mask at all times, including in the patient's room. Patients must also wear a mask when staff or their support person are in the room. Visitors GUEST BADGE MUST BE WORN VISIBLY  One adult visitor may remain with you overnight and MUST be in the room by 8 P.M.     Your procedure is scheduled on: 04/16/22   Report to [facility] Main Entrance    Report to admitting at  9:45 AM   Call this number if you have problems the morning of surgery [phone]   Do not eat food or drink :After Midnight.           If you have questions, please contact your surgeon's office.    Oral Hygiene is also important to reduce your risk of infection.                                    Remember - BRUSH YOUR TEETH THE MORNING OF SURGERY WITH YOUR REGULAR TOOTHPASTE  DENTURES WILL BE REMOVED PRIOR TO SURGERY PLEASE DO NOT APPLY "Poly grip" OR ADHESIVES!!!    Take these medicines the morning of surgery with A SIP OF WATER: Use your inhalers and bring them with you                                                                                                                            Bupropion                                                                                                                            Lexapro  Pentoxifylline-Trental                                You may not have any metal on your body including hair pins, jewelry, and body  piercing             Do not wear make-up, lotions, powders, perfumes, or deodorant  Do not wear nail polish including gel and S&S, artificial/acrylic nails, or any other type of covering on natural nails including finger and toenails. If you have artificial nails, gel coating, etc. that needs to be removed by a nail salon please have this removed prior to surgery or surgery may need to be canceled/ delayed if the surgeon/ anesthesia feels like they are unable to be safely monitored.   Do not shave  48 hours prior to surgery.  .   Do not bring valuables to the hospital. [place].   Contacts, glasses, or bridgework may not be worn into surgery.   Bring small overnight bag day of surgery.   DO NOT [place].   Patients discharged on the day of surgery will not be allowed to drive home.  Someone NEEDS to stay with you for the first 24 hours after anesthesia.   Special Instructions: Bring a copy of your healthcare power of attorney and living will documents the day of surgery if you haven't scanned them before.              Please read over the following fact sheets you were given: IF YOU HAVE QUESTIONS ABOUT YOUR PRE-OP INSTRUCTIONS PLEASE CALL [phone]    [facility] - Preparing for Surgery Before surgery, you can play an important role.  Because skin is not sterile, your skin needs to be as free of germs as possible.  You can reduce the number of germs on your skin by washing with CHG (chlorahexidine gluconate) soap before surgery.  CHG is an antiseptic cleaner which kills germs and bonds with the skin to continue killing germs even after washing. Please DO NOT use if you have an allergy to CHG or antibacterial soaps.  If your skin becomes reddened/irritated stop using the CHG and inform your nurse when you arrive at Short Stay. Do not shave (including legs and underarms) for at least 48 hours prior to  the first CHG shower.   Please follow these instructions carefully:  1.  Shower with CHG Soap for 3 days prior to surgery , the night before surgery and the  morning of Surgery.  2.  If you choose to wash your hair, wash your hair first as usual with your  normal  shampoo.  3.  After you shampoo, rinse your hair and body thoroughly to remove the  shampoo.                            4.  Use CHG as you would any other liquid soap.  You can apply chg directly  to the skin and wash                       Gently with a scrungie or clean washcloth.  5.  Apply the CHG Soap to your body ONLY FROM THE NECK DOWN.  Do not use on face/ open                           Wound or open sores. Avoid contact with eyes, ears mouth and genitals (private parts).                       Wash face,  Genitals (private parts) with your normal soap.             6.  Wash thoroughly, paying special attention to the area where your surgery  will be performed.  7.  Thoroughly rinse your body with warm water from the neck down.  8.  DO NOT shower/wash with your normal soap after using and rinsing off  the CHG Soap.             9.  Pat yourself dry with a clean towel.            10.  Wear clean pajamas.            11.  Place clean sheets on your bed the night of your first shower and do not  sleep with pets. Day of Surgery : Do not apply any lotions/deodorants the morning of surgery.  Please wear clean clothes to the hospital/surgery center.  FAILURE TO FOLLOW THESE INSTRUCTIONS MAY RESULT IN THE CANCELLATION OF YOUR SURGERY  PATIENT SIGNATURE_________________________________  ________________________________________________________________________

## 2022-04-10 NOTE — Progress Notes (Signed)
Anesthesia note:   PCP - Dr. Loni Muse Plotnikov Cardiologist -none Other-   Chest x-ray - no EKG - 04/10/22-chart Stress Test - no ECHO - no Cardiac Cath - no CABG-no Pacemaker/ICD device last checked:NA  Sleep Study - yes-negative findings CPAP - no  Pt is pre diabetic-no CBG at PAT visit- Fasting Blood Sugar at home- Checks Blood Sugar _____  Blood Thinner:no Blood Thinner Instructions: Aspirin Instructions: Last Dose:  Anesthesia review: Yes  reason:  Patient denies shortness of breath, fever, cough and chest pain at PAT appointment. Pt has BMI 60. She is 380lbs. She can climb one flight of stairs. She uses her inhalers when needed especially during high pollin days. She is a smoker.   Patient verbalized understanding of instructions that were given to them at the PAT appointment. Patient was also instructed that they will need to review over the PAT instructions again at home before surgery.yes

## 2022-04-12 NOTE — H&P (Signed)
CC/HPI: cc: Gross hematuria   10/31/2021: 52 year old woman comes in for evaluation of gross hematuria. It is painless and intermittent that began in September 2023. She denies any flank pain, UTIs or kidney stones. She does have a 40-year tobacco history and still smokes approximately 1 pack a day. No family history of bladder or kidney cancer.   12/28/21: Here for cystoscpy. She continues to have intermittent dark urine. No pain. CT scan shows left urolithiaisis but not hydro or renal mass.   03/29/22: 52 year old woman with intermittent gross hematuria here for follow-up. She was seen by nephrology and no further evaluation was indicated. She continues to have intermittent dark urine. About 2 weeks ago she developed suprapubic pain that radiated to her back. It since subsided but she does not think she passed anything.     ALLERGIES: Adhesive tape - causes blistering Steristrips - blisters    MEDICATIONS: Albuterol Sulfate  Alprazolam 1 mg tablet  Bupropion Hcl 100 mg tablet  Cholecalciferol  Escitalopram Oxalate 20 mg tablet  Hydrocodone-Acetaminophen 5 mg-325 mg tablet  Multivitamin  Niacinamide  Pentoxifylline 400 mg tablet, extended release  Vitamin B12  Vitamin D3     GU PSH: Cystoscopy - 12/28/2021 Locm 300-399Mg /Ml Iodine,1Ml - 11/16/2021       PSH Notes: tonsillectomy, laparoscopic banding for weight loss, shoulder surgery (bilateral)gallbladder removal, tubal ligation and ablation.[name]   NON-GU PSH: Appendectomy     GU PMH: Gross hematuria - 12/28/2021, - 11/16/2021, - 10/31/2021    NON-GU PMH: Anxiety Arthritis Asthma Depression Hypertension    FAMILY HISTORY: Diabetes - Mother Heart Disease - Father   SOCIAL HISTORY: Marital Status: Married Preferred Language: English; Ethnicity: Not Hispanic Or Latino; Race: White Has never drank.  Drinks 4+ caffeinated drinks per day.    REVIEW OF SYSTEMS:    GU Review Female:   Patient denies get up at night to  urinate, have to strain to urinate, frequent urination, being pregnant, burning /pain with urination, stream starts and stops, leakage of urine, trouble starting your stream, and hard to postpone urination.  Gastrointestinal (Upper):   Patient denies nausea, vomiting, and indigestion/ heartburn.  Gastrointestinal (Lower):   Patient denies diarrhea and constipation.  Constitutional:   Patient denies fever, night sweats, weight loss, and fatigue.  Skin:   Patient denies skin rash/ lesion and itching.  Eyes:   Patient denies blurred vision and double vision.  Ears/ Nose/ Throat:   Patient denies sore throat and sinus problems.  Hematologic/Lymphatic:   Patient denies swollen glands and easy bruising.  Cardiovascular:   Patient denies leg swelling and chest pains.  Respiratory:   Patient denies cough and shortness of breath.  Endocrine:   Patient denies excessive thirst.  Musculoskeletal:   Patient denies back pain and joint pain.  Neurological:   Patient denies headaches and dizziness.  Psychologic:   Patient denies depression and anxiety.   VITAL SIGNS:      03/29/2022 02:01 PM  Weight 386 lb / 175.09 kg  Height 66 in / 167.64 cm  BP 148/95 mmHg  Heart Rate 99 /min  Temperature 98.8 F / 37.1 C  BMI 62.3 kg/m   MULTI-SYSTEM PHYSICAL EXAMINATION:    Constitutional: Well-nourished. No physical deformities. Normally developed. Good grooming.  Neck: Neck symmetrical, not swollen. Normal tracheal position.  Respiratory: No labored breathing, no use of accessory muscles.   Skin: No paleness, no jaundice, no cyanosis. No lesion, no ulcer, no rash.  Neurologic / Psychiatric: Oriented to time, oriented  to place, oriented to person. No depression, no anxiety, no agitation.  Eyes: Normal conjunctivae. Normal eyelids.  Ears, Nose, Mouth, and Throat: Left ear no scars, no lesions, no masses. Right ear no scars, no lesions, no masses. Nose no scars, no lesions, no masses. Normal hearing. Normal lips.   Musculoskeletal: Normal gait and station of head and neck.     Complexity of Data:  Records Review:   Previous Patient Records, POC Tool  Urine Test Review:   Urinalysis  Notes:                     11/27/2021: BUN 8, creatinine 0.65, EGFR 102   PROCEDURES:          Urinalysis Dipstick Dipstick Cont'd  Color: Yellow Bilirubin: Neg mg/dL  Appearance: Clear Ketones: Neg mg/dL  Specific Gravity: 3.0861.015 Blood: Neg ery/uL  pH: 6.5 Protein: Neg mg/dL  Glucose: Neg mg/dL Urobilinogen: 0.2 mg/dL    Nitrites: Neg    Leukocyte Esterase: Neg leu/uL    ASSESSMENT:      ICD-10 Details  1 GU:   Gross hematuria - R31.0 Undiagnosed New Problem  2   Renal calculus - N20.0 Chronic, Stable   PLAN:           Document Letter(s):  Created for Patient: Clinical Summary         Notes:   Gross hematuria/urolithiasis:  -pt continues with intermittent gross hematuria  -discussed performing bilateral diagnostic ureteroscopy and could address left renal calculus at same time  -Risks and benefits of the procedure including but not limited to pain, bleeding, infection, damage to surrounding structures, need for prolonged stent, stent discomfort, need for additional treatment discussed with the patient

## 2022-04-12 NOTE — H&P (View-Only) (Signed)
CC/HPI: cc: Gross hematuria   10/31/2021: 51-year-old woman comes in for evaluation of gross hematuria. It is painless and intermittent that began in September 2023. She denies any flank pain, UTIs or kidney stones. She does have a 40-year tobacco history and still smokes approximately 1 pack a day. No family history of bladder or kidney cancer.   12/28/21: Here for cystoscpy. She continues to have intermittent dark urine. No pain. CT scan shows left urolithiaisis but not hydro or renal mass.   03/29/22: 52-year-old woman with intermittent gross hematuria here for follow-up. She was seen by nephrology and no further evaluation was indicated. She continues to have intermittent dark urine. About 2 weeks ago she developed suprapubic pain that radiated to her back. It since subsided but she does not think she passed anything.     ALLERGIES: Adhesive tape - causes blistering Steristrips - blisters    MEDICATIONS: Albuterol Sulfate  Alprazolam 1 mg tablet  Bupropion Hcl 100 mg tablet  Cholecalciferol  Escitalopram Oxalate 20 mg tablet  Hydrocodone-Acetaminophen 5 mg-325 mg tablet  Multivitamin  Niacinamide  Pentoxifylline 400 mg tablet, extended release  Vitamin B12  Vitamin D3     GU PSH: Cystoscopy - 12/28/2021 Locm 300-399Mg/Ml Iodine,1Ml - 11/16/2021       PSH Notes: tonsillectomy, laparoscopic banding for weight loss, shoulder surgery (bilateral)gallbladder removal, tubal ligation and ablation..   NON-GU PSH: Appendectomy     GU PMH: Gross hematuria - 12/28/2021, - 11/16/2021, - 10/31/2021    NON-GU PMH: Anxiety Arthritis Asthma Depression Hypertension    FAMILY HISTORY: Diabetes - Mother Heart Disease - Father   SOCIAL HISTORY: Marital Status: Married Preferred Language: English; Ethnicity: Not Hispanic Or Latino; Race: White Has never drank.  Drinks 4+ caffeinated drinks per day.    REVIEW OF SYSTEMS:    GU Review Female:   Patient denies get up at night to  urinate, have to strain to urinate, frequent urination, being pregnant, burning /pain with urination, stream starts and stops, leakage of urine, trouble starting your stream, and hard to postpone urination.  Gastrointestinal (Upper):   Patient denies nausea, vomiting, and indigestion/ heartburn.  Gastrointestinal (Lower):   Patient denies diarrhea and constipation.  Constitutional:   Patient denies fever, night sweats, weight loss, and fatigue.  Skin:   Patient denies skin rash/ lesion and itching.  Eyes:   Patient denies blurred vision and double vision.  Ears/ Nose/ Throat:   Patient denies sore throat and sinus problems.  Hematologic/Lymphatic:   Patient denies swollen glands and easy bruising.  Cardiovascular:   Patient denies leg swelling and chest pains.  Respiratory:   Patient denies cough and shortness of breath.  Endocrine:   Patient denies excessive thirst.  Musculoskeletal:   Patient denies back pain and joint pain.  Neurological:   Patient denies headaches and dizziness.  Psychologic:   Patient denies depression and anxiety.   VITAL SIGNS:      03/29/2022 02:01 PM  Weight 386 lb / 175.09 kg  Height 66 in / 167.64 cm  BP 148/95 mmHg  Heart Rate 99 /min  Temperature 98.8 F / 37.1 C  BMI 62.3 kg/m   MULTI-SYSTEM PHYSICAL EXAMINATION:    Constitutional: Well-nourished. No physical deformities. Normally developed. Good grooming.  Neck: Neck symmetrical, not swollen. Normal tracheal position.  Respiratory: No labored breathing, no use of accessory muscles.   Skin: No paleness, no jaundice, no cyanosis. No lesion, no ulcer, no rash.  Neurologic / Psychiatric: Oriented to time, oriented   to place, oriented to person. No depression, no anxiety, no agitation.  Eyes: Normal conjunctivae. Normal eyelids.  Ears, Nose, Mouth, and Throat: Left ear no scars, no lesions, no masses. Right ear no scars, no lesions, no masses. Nose no scars, no lesions, no masses. Normal hearing. Normal lips.   Musculoskeletal: Normal gait and station of head and neck.     Complexity of Data:  Records Review:   Previous Patient Records, POC Tool  Urine Test Review:   Urinalysis  Notes:                     11/27/2021: BUN 8, creatinine 0.65, EGFR 102   PROCEDURES:          Urinalysis Dipstick Dipstick Cont'd  Color: Yellow Bilirubin: Neg mg/dL  Appearance: Clear Ketones: Neg mg/dL  Specific Gravity: 1.015 Blood: Neg ery/uL  pH: 6.5 Protein: Neg mg/dL  Glucose: Neg mg/dL Urobilinogen: 0.2 mg/dL    Nitrites: Neg    Leukocyte Esterase: Neg leu/uL    ASSESSMENT:      ICD-10 Details  1 GU:   Gross hematuria - R31.0 Undiagnosed New Problem  2   Renal calculus - N20.0 Chronic, Stable   PLAN:           Document Letter(s):  Created for Patient: Clinical Summary         Notes:   Gross hematuria/urolithiasis:  -pt continues with intermittent gross hematuria  -discussed performing bilateral diagnostic ureteroscopy and could address left renal calculus at same time  -Risks and benefits of the procedure including but not limited to pain, bleeding, infection, damage to surrounding structures, need for prolonged stent, stent discomfort, need for additional treatment discussed with the patient     

## 2022-04-16 ENCOUNTER — Ambulatory Visit (HOSPITAL_COMMUNITY): Payer: BC Managed Care – PPO | Admitting: Anesthesiology

## 2022-04-16 ENCOUNTER — Encounter (HOSPITAL_COMMUNITY): Payer: Self-pay | Admitting: Urology

## 2022-04-16 ENCOUNTER — Ambulatory Visit (HOSPITAL_COMMUNITY)
Admission: RE | Admit: 2022-04-16 | Discharge: 2022-04-16 | Disposition: A | Discharge status: Home / Self Care | Payer: BC Managed Care – PPO | Attending: Urology | Admitting: Urology

## 2022-04-16 ENCOUNTER — Other Ambulatory Visit: Payer: Self-pay

## 2022-04-16 ENCOUNTER — Ambulatory Visit (HOSPITAL_COMMUNITY): Payer: BC Managed Care – PPO | Admitting: Physician Assistant

## 2022-04-16 ENCOUNTER — Ambulatory Visit (HOSPITAL_COMMUNITY): Payer: BC Managed Care – PPO

## 2022-04-16 ENCOUNTER — Encounter (HOSPITAL_COMMUNITY)
Admission: RE | Disposition: A | Discharge status: Home / Self Care | Payer: Self-pay | Source: Home / Self Care | Attending: Urology

## 2022-04-16 DIAGNOSIS — I10 Essential (primary) hypertension: Secondary | ICD-10-CM | POA: Insufficient documentation

## 2022-04-16 DIAGNOSIS — M1990 Unspecified osteoarthritis, unspecified site: Secondary | ICD-10-CM | POA: Insufficient documentation

## 2022-04-16 DIAGNOSIS — F17200 Nicotine dependence, unspecified, uncomplicated: Secondary | ICD-10-CM | POA: Diagnosis not present

## 2022-04-16 DIAGNOSIS — N200 Calculus of kidney: Secondary | ICD-10-CM | POA: Insufficient documentation

## 2022-04-16 DIAGNOSIS — F32A Depression, unspecified: Secondary | ICD-10-CM | POA: Diagnosis not present

## 2022-04-16 DIAGNOSIS — F419 Anxiety disorder, unspecified: Secondary | ICD-10-CM | POA: Diagnosis not present

## 2022-04-16 DIAGNOSIS — Z6841 Body Mass Index (BMI) 40.0 and over, adult: Secondary | ICD-10-CM | POA: Diagnosis not present

## 2022-04-16 DIAGNOSIS — J45909 Unspecified asthma, uncomplicated: Secondary | ICD-10-CM | POA: Insufficient documentation

## 2022-04-16 HISTORY — PX: CYSTOSCOPY/URETEROSCOPY/HOLMIUM LASER/STENT PLACEMENT: SHX6546

## 2022-04-16 SURGERY — CYSTOSCOPY/URETEROSCOPY/HOLMIUM LASER/STENT PLACEMENT
Anesthesia: General | Laterality: Bilateral

## 2022-04-16 MED ORDER — KETOROLAC TROMETHAMINE 30 MG/ML IJ SOLN
30.0000 mg | Freq: Once | INTRAMUSCULAR | Status: AC | PRN
  Administered 2022-04-16: 30 mg via INTRAVENOUS

## 2022-04-16 MED ORDER — AMISULPRIDE (ANTIEMETIC) 5 MG/2ML IV SOLN: 10.0000 mg | Freq: Once | INTRAVENOUS | Status: DC | PRN

## 2022-04-16 MED ORDER — ONDANSETRON HCL 4 MG/2ML IJ SOLN
INTRAMUSCULAR | Status: AC
  Filled 2022-04-16: qty 2

## 2022-04-16 MED ORDER — DEXAMETHASONE SODIUM PHOSPHATE 10 MG/ML IJ SOLN
INTRAMUSCULAR | Status: AC
  Filled 2022-04-16: qty 1

## 2022-04-16 MED ORDER — ONDANSETRON HCL 4 MG/2ML IJ SOLN
INTRAMUSCULAR | Status: DC | PRN
  Administered 2022-04-16: 4 mg via INTRAVENOUS

## 2022-04-16 MED ORDER — CHLORHEXIDINE GLUCONATE 0.12 % MT SOLN
15.0000 mL | Freq: Once | OROMUCOSAL | Status: AC
  Administered 2022-04-16: 15 mL via OROMUCOSAL

## 2022-04-16 MED ORDER — SUCCINYLCHOLINE CHLORIDE 200 MG/10ML IV SOSY
PREFILLED_SYRINGE | INTRAVENOUS | Status: DC | PRN
  Administered 2022-04-16: 180 mg via INTRAVENOUS

## 2022-04-16 MED ORDER — OXYCODONE HCL 5 MG PO TABS: 5.0000 mg | ORAL_TABLET | Freq: Once | ORAL | Status: AC

## 2022-04-16 MED ORDER — SODIUM CHLORIDE 0.9 % IR SOLN
Status: DC | PRN
  Administered 2022-04-16: 6000 mL

## 2022-04-16 MED ORDER — OXYBUTYNIN CHLORIDE 5 MG PO TABS: 5.0000 mg | ORAL_TABLET | Freq: Three times a day (TID) | ORAL | 0 refills | Status: DC | PRN

## 2022-04-16 MED ORDER — PROMETHAZINE HCL 25 MG/ML IJ SOLN
6.2500 mg | INTRAMUSCULAR | Status: DC | PRN
  Administered 2022-04-16: 12.5 mg via INTRAVENOUS

## 2022-04-16 MED ORDER — OXYCODONE HCL 5 MG PO TABS
ORAL_TABLET | ORAL | Status: AC
  Filled 2022-04-16: qty 1

## 2022-04-16 MED ORDER — FENTANYL CITRATE (PF) 100 MCG/2ML IJ SOLN
INTRAMUSCULAR | Status: DC | PRN
  Administered 2022-04-16 (×2): 50 ug via INTRAVENOUS

## 2022-04-16 MED ORDER — TAMSULOSIN HCL 0.4 MG PO CAPS: 0.4000 mg | ORAL_CAPSULE | Freq: Every day | ORAL | 0 refills | Status: DC

## 2022-04-16 MED ORDER — SUGAMMADEX SODIUM 200 MG/2ML IV SOLN
INTRAVENOUS | Status: DC | PRN
  Administered 2022-04-16: 400 mg via INTRAVENOUS

## 2022-04-16 MED ORDER — DEXAMETHASONE SODIUM PHOSPHATE 10 MG/ML IJ SOLN
INTRAMUSCULAR | Status: DC | PRN
  Administered 2022-04-16: 8 mg via INTRAVENOUS

## 2022-04-16 MED ORDER — ROCURONIUM BROMIDE 10 MG/ML (PF) SYRINGE
PREFILLED_SYRINGE | INTRAVENOUS | Status: AC
  Filled 2022-04-16: qty 10

## 2022-04-16 MED ORDER — ACETAMINOPHEN 10 MG/ML IV SOLN: 1000.0000 mg | Freq: Once | INTRAVENOUS | Status: DC | PRN

## 2022-04-16 MED ORDER — LIDOCAINE HCL (PF) 2 % IJ SOLN
INTRAMUSCULAR | Status: AC
  Filled 2022-04-16: qty 5

## 2022-04-16 MED ORDER — SUCCINYLCHOLINE CHLORIDE 200 MG/10ML IV SOSY
PREFILLED_SYRINGE | INTRAVENOUS | Status: AC
  Filled 2022-04-16: qty 10

## 2022-04-16 MED ORDER — KETOROLAC TROMETHAMINE 30 MG/ML IJ SOLN
INTRAMUSCULAR | Status: AC
  Filled 2022-04-16: qty 1

## 2022-04-16 MED ORDER — PROPOFOL 10 MG/ML IV BOLUS
INTRAVENOUS | Status: AC
  Filled 2022-04-16: qty 20

## 2022-04-16 MED ORDER — FENTANYL CITRATE PF 50 MCG/ML IJ SOSY
25.0000 ug | PREFILLED_SYRINGE | INTRAMUSCULAR | Status: DC | PRN
  Administered 2022-04-16 (×3): 50 ug via INTRAVENOUS

## 2022-04-16 MED ORDER — MIDAZOLAM HCL 2 MG/2ML IJ SOLN
INTRAMUSCULAR | Status: AC
  Filled 2022-04-16: qty 2

## 2022-04-16 MED ORDER — OXYCODONE HCL 5 MG PO TABS
ORAL_TABLET | ORAL | Status: AC
  Administered 2022-04-16: 5 mg via ORAL
  Filled 2022-04-16: qty 1

## 2022-04-16 MED ORDER — ROCURONIUM BROMIDE 100 MG/10ML IV SOLN
INTRAVENOUS | Status: DC | PRN
  Administered 2022-04-16: 40 mg via INTRAVENOUS

## 2022-04-16 MED ORDER — OXYCODONE HCL 5 MG/5ML PO SOLN: 5.0000 mg | Freq: Once | ORAL | Status: AC | PRN

## 2022-04-16 MED ORDER — FENTANYL CITRATE (PF) 100 MCG/2ML IJ SOLN
INTRAMUSCULAR | Status: AC
  Filled 2022-04-16: qty 2

## 2022-04-16 MED ORDER — CEFAZOLIN IN SODIUM CHLORIDE 3-0.9 GM/100ML-% IV SOLN
3.0000 g | INTRAVENOUS | Status: AC
  Administered 2022-04-16: 3 g via INTRAVENOUS
  Filled 2022-04-16: qty 100

## 2022-04-16 MED ORDER — IOHEXOL 300 MG/ML  SOLN
INTRAMUSCULAR | Status: DC | PRN
  Administered 2022-04-16: 20 mL

## 2022-04-16 MED ORDER — ACETAMINOPHEN 10 MG/ML IV SOLN
INTRAVENOUS | Status: AC
  Administered 2022-04-16: 1000 mg via INTRAVENOUS
  Filled 2022-04-16: qty 100

## 2022-04-16 MED ORDER — LACTATED RINGERS IV SOLN: INTRAVENOUS | Status: DC

## 2022-04-16 MED ORDER — HYDROCODONE-ACETAMINOPHEN 5-325 MG PO TABS: 1.0000 | ORAL_TABLET | ORAL | 0 refills | Status: DC | PRN

## 2022-04-16 MED ORDER — MIDAZOLAM HCL 5 MG/5ML IJ SOLN
INTRAMUSCULAR | Status: DC | PRN
  Administered 2022-04-16: 2 mg via INTRAVENOUS

## 2022-04-16 MED ORDER — ACETAMINOPHEN 500 MG PO TABS
1000.0000 mg | ORAL_TABLET | Freq: Once | ORAL | Status: AC
  Administered 2022-04-16: 1000 mg via ORAL
  Filled 2022-04-16: qty 2

## 2022-04-16 MED ORDER — ORAL CARE MOUTH RINSE: 15.0000 mL | Freq: Once | OROMUCOSAL | Status: AC

## 2022-04-16 MED ORDER — PROPOFOL 10 MG/ML IV BOLUS
INTRAVENOUS | Status: DC | PRN
  Administered 2022-04-16 (×3): 20 mg via INTRAVENOUS
  Administered 2022-04-16: 200 mg via INTRAVENOUS

## 2022-04-16 MED ORDER — OXYCODONE HCL 5 MG PO TABS
5.0000 mg | ORAL_TABLET | Freq: Once | ORAL | Status: AC | PRN
  Administered 2022-04-16: 5 mg via ORAL

## 2022-04-16 MED ORDER — PROMETHAZINE HCL 25 MG/ML IJ SOLN
INTRAMUSCULAR | Status: AC
  Filled 2022-04-16: qty 1

## 2022-04-16 MED ORDER — FENTANYL CITRATE PF 50 MCG/ML IJ SOSY
PREFILLED_SYRINGE | INTRAMUSCULAR | Status: AC
  Filled 2022-04-16: qty 1

## 2022-04-16 MED ORDER — FENTANYL CITRATE PF 50 MCG/ML IJ SOSY
PREFILLED_SYRINGE | INTRAMUSCULAR | Status: AC
  Filled 2022-04-16: qty 2

## 2022-04-16 SURGICAL SUPPLY — 19 items
BAG URO CATCHER STRL LF (MISCELLANEOUS) ×1 IMPLANT
BASKET ZERO TIP NITINOL 2.4FR (BASKET) IMPLANT
BSKT STON RTRVL ZERO TP 2.4FR (BASKET)
CATH URETL OPEN 5X70 (CATHETERS) ×1 IMPLANT
CLOTH BEACON ORANGE TIMEOUT ST (SAFETY) ×1 IMPLANT
DRSG TEGADERM 2-3/8X2-3/4 SM (GAUZE/BANDAGES/DRESSINGS) IMPLANT
EXTRACTOR STONE 1.7FRX115CM (UROLOGICAL SUPPLIES) IMPLANT
GLOVE BIO SURGEON STRL SZ 6.5 (GLOVE) ×1 IMPLANT
GOWN STRL REUS W/ TWL LRG LVL3 (GOWN DISPOSABLE) ×1 IMPLANT
GOWN STRL REUS W/TWL LRG LVL3 (GOWN DISPOSABLE) ×1
GUIDEWIRE STR DUAL SENSOR (WIRE) ×1 IMPLANT
KIT TURNOVER KIT A (KITS) IMPLANT
MANIFOLD NEPTUNE II (INSTRUMENTS) ×1 IMPLANT
PACK CYSTO (CUSTOM PROCEDURE TRAY) ×1 IMPLANT
SHEATH NAVIGATOR HD 11/13X28 (SHEATH) IMPLANT
SHEATH NAVIGATOR HD 11/13X36 (SHEATH) IMPLANT
STENT URET 6FRX26 CONTOUR (STENTS) IMPLANT
TUBING CONNECTING 10 (TUBING) ×1 IMPLANT
TUBING UROLOGY SET (TUBING) ×1 IMPLANT

## 2022-04-16 NOTE — Anesthesia Postprocedure Evaluation (Signed)
Anesthesia Post Note  Patient: Stephanie Harper  Procedure(s) Performed: CYSTOSCOPY BILATERAL DIAGNOSTIC URETEROSCOPY, BILATERAL RETROGRADE PYELOGRAM, AND BILATERAL URETERAL STENT PLACEMENT (Bilateral)     Patient location during evaluation: PACU Anesthesia Type: General Level of consciousness: awake Pain management: pain level controlled Vital Signs Assessment: post-procedure vital signs reviewed and stable Respiratory status: spontaneous breathing, nonlabored ventilation and respiratory function stable Cardiovascular status: blood pressure returned to baseline and stable Postop Assessment: no apparent nausea or vomiting Anesthetic complications: no   No notable events documented.  Last Vitals:  Vitals:   04/16/22 1640 04/16/22 1645  BP: (!) 166/94 (!) 164/99  Pulse:  81  Resp:    Temp:    SpO2:  94%    Last Pain:  Vitals:   04/16/22 1418  TempSrc:   PainSc: 6                  Satonya Lux P Cambry Spampinato

## 2022-04-16 NOTE — Anesthesia Procedure Notes (Signed)
Procedure Name: Intubation Date/Time: 04/16/2022 12:12 PM  Performed by: Johnette Abraham, CRNAPre-anesthesia Checklist: Patient identified, Emergency Drugs available, Suction available and Patient being monitored Patient Re-evaluated:Patient Re-evaluated prior to induction Oxygen Delivery Method: Circle System Utilized Preoxygenation: Pre-oxygenation with 100% oxygen Induction Type: IV induction Ventilation: Mask ventilation without difficulty Laryngoscope Size: Mac, 3 and Glidescope Grade View: Grade I Tube type: Oral Tube size: 7.0 mm Number of attempts: 1 Airway Equipment and Method: Stylet and Oral airway Placement Confirmation: ETT inserted through vocal cords under direct vision, positive ETCO2 and breath sounds checked- equal and bilateral Secured at: 23 cm Tube secured with: Tape Dental Injury: Teeth and Oropharynx as per pre-operative assessment

## 2022-04-16 NOTE — Interval H&P Note (Signed)
History and Physical Interval Note:  04/16/2022 11:25 AM  [name]  has presented today for surgery, with the diagnosis of GROSS HEMATURIA, LEFT RENAL CALCULUS.  The various methods of treatment have been discussed with the patient and family. After consideration of risks, benefits and other options for treatment, the patient has consented to  Procedure(s) with comments: CYSTOSCOPY BILATERAL DIAGNOSTIC URETEROSCOPY, HOLMIUM LASER LITHOTRIPSY, BILATERAL RETROGRADE PYELOGRAM, AND BILATERAL URETERAL STENT PLACEMENT (Bilateral) - 90 MINUTES as a surgical intervention.  The patient's history has been reviewed, patient examined, no change in status, stable for surgery.  I have reviewed the patient's chart and labs.  Questions were answered to the patient's satisfaction.     Laticia Vannostrand D Andromeda Poppen

## 2022-04-16 NOTE — Op Note (Signed)
Preoperative diagnosis: Gross hematuria, left renal calculus  Postoperative diagnosis: Gross hematuria, left renal calculus  Procedure:  Cystoscopy Diagnostic bilateral ureteroscopy bilateral 66F x 26cm ureteral stent placement - no tether bilateral retrograde pyelography with interpretation  Surgeon: Kasandra Knudsen, MD  Anesthesia: General  Complications: None  Intraoperative findings:  Normal urethra Bilateral orthotropic ureteral orifices bilateral retrograde pyelography demonstrated a filling defect within the bilateral ureter consistent with the patient's known calculus without other abnormalities. Bladder mucosa with erythema of the posterior, inferior bladder wall question scope trauma versus irregular mucosa  EBL: Minimal  Specimens: none  Disposition of specimens: [facility] for stone analysis  Indication: Stephanie Harper is a 52 y.o.   patient with a gross hematuria.  After reviewing the management options for treatment, the patient elected to proceed with the above surgical procedure(s). We have discussed the potential benefits and risks of the procedure, side effects of the proposed treatment, the likelihood of the patient achieving the goals of the procedure, and any potential problems that might occur during the procedure or recuperation. Informed consent has been obtained.   Description of procedure:  The patient was taken to the operating room and general anesthesia was induced.  The patient was placed in the dorsal lithotomy position, prepped and draped in the usual sterile fashion, and preoperative antibiotics were administered. A preoperative time-out was performed.   Cystourethroscopy was performed.  The patient's urethra was examined and was normal.  The bladder was then systematically examined in its entirety. There was no evidence for any bladder tumors, stones, or other mucosal pathology.    Attention then turned to the left ureteral  orifice and a ureteral catheter was used to intubate the ureteral orifice.  Omnipaque contrast was injected through the ureteral catheter and a retrograde pyelogram was performed with findings as dictated above.  A 0.38 sensor guidewire was then advanced up the left into the renal pelvis under fluoroscopic guidance. The 4.5 Fr semirigid ureteroscope was then advanced into the ureter alongside the sensor wire to the proximal ureter.  There is no abnormality noted to the ureter.  A second wire was placed through the semirigid ureteroscope and the ureteroscope was removed.  Next the ureteral access sheath was placed over the second wire and advanced to the mid ureter with fluoroscopy.  The inner sheath and wire were removed.  There was some resistance met in the mid to proximal ureter.  Attempts at advancing the flexible ureteroscope past this narrow area was not successful.  The ureteroscope was then removed and unison with the access sheath taking care examine the ureter on the way out.  No obstruction, hematuria or masses were seen in the ureter.   The wire was then backloaded through the cystoscope and a ureteral stent was advance over the wire using Seldinger technique.  The stent was positioned appropriately under fluoroscopic and cystoscopic guidance.  The wire was then removed with an adequate stent curl noted in the renal pelvis as well as in the bladder.  Next attention turned to the right side.  An open-ended ureteral catheter was placed in a similar manner to the left and a retrograde pyelogram was obtained.  There were no filling defects noted.  A sensor wire was then placed through the open-ended ureteral catheter and the ureteral catheter was removed.  Semirigid ureteroscopy then took place alongside the wire and the ureter was examined.  There were no masses, obstruction or source of hematuria notified.  A second wire  was placed through the ureteroscope and the ureteroscope was removed.  Attempts  at placing the inner sheath of the ureteral access sheath was attempted over the second wire but unsuccessful.  Decision was made to leave a stent.  A 6 French by 26 cm stent on the right side was placed in standard fashion and confirmed with fluoroscopy.  The bladder was then emptied and the procedure ended.  The patient appeared to tolerate the procedure well and without complications.  The patient was able to be awakened and transferred to the recovery unit in satisfactory condition.   Disposition: Plan to return in 2 to 3 weeks and will consider cystoscopy with bladder biopsy at the time.

## 2022-04-16 NOTE — Anesthesia Preprocedure Evaluation (Addendum)
Anesthesia Evaluation  Patient identified by MRN, date of birth, ID band Patient awake    Reviewed: Allergy & Precautions, NPO status , Patient's Chart, lab work & pertinent test results  Airway Mallampati: I  TM Distance: >3 FB Neck ROM: Full    Dental no notable dental hx.    Pulmonary asthma , Current SmokerPatient did not abstain from smoking.   Pulmonary exam normal        Cardiovascular hypertension, Normal cardiovascular exam     Neuro/Psych  PSYCHIATRIC DISORDERS Anxiety Depression    negative neurological ROS     GI/Hepatic negative GI ROS,,,(+)     substance abuse    Endo/Other    Morbid obesity  Renal/GU negative Renal ROS     Musculoskeletal  (+) Arthritis ,  narcotic dependent  Abdominal  (+) + obese  Peds  Hematology negative hematology ROS (+)   Anesthesia Other Findings GROSS HEMATURIA LEFT RENAL CALCULUS  Reproductive/Obstetrics                             Anesthesia Physical Anesthesia Plan  ASA: 4  Anesthesia Plan: General   Post-op Pain Management:    Induction: Intravenous  PONV Risk Score and Plan: 3 and Ondansetron, Dexamethasone, Midazolam and Treatment may vary due to age or medical condition  Airway Management Planned: Oral ETT  Additional Equipment:   Intra-op Plan:   Post-operative Plan: Extubation in OR  Informed Consent: I have reviewed the patients History and Physical, chart, labs and discussed the procedure including the risks, benefits and alternatives for the proposed anesthesia with the patient or authorized representative who has indicated his/her understanding and acceptance.     Dental advisory given  Plan Discussed with: CRNA  Anesthesia Plan Comments:        Anesthesia Quick Evaluation

## 2022-04-16 NOTE — Transfer of Care (Signed)
Immediate Anesthesia Transfer of Care Note  Patient: Stephanie Harper  Procedure(s) Performed: CYSTOSCOPY BILATERAL DIAGNOSTIC URETEROSCOPY, BILATERAL RETROGRADE PYELOGRAM, AND BILATERAL URETERAL STENT PLACEMENT (Bilateral)  Patient Location: PACU  Anesthesia Type:General  Level of Consciousness: awake, alert , oriented, and patient cooperative  Airway & Oxygen Therapy: Patient Spontanous Breathing and Patient connected to face mask oxygen  Post-op Assessment: Report given to RN, Post -op Vital signs reviewed and stable, and Patient moving all extremities  Post vital signs: Reviewed and stable  Last Vitals:  Vitals Value Taken Time  BP 154/89 04/16/22 1310  Temp 36.6 C 04/16/22 1310  Pulse 84 04/16/22 1312  Resp 17 04/16/22 1312  SpO2 98 % 04/16/22 1312  Vitals shown include unvalidated device data.  Last Pain:  Vitals:   04/16/22 1310  TempSrc:   PainSc: 0-No pain         Complications: No notable events documented.

## 2022-04-16 NOTE — Discharge Instructions (Addendum)
DISCHARGE INSTRUCTIONS FOR KIDNEY STONE/URETERAL STENT   MEDICATIONS:  1. Resume all your other meds from home  2. AZO over the counter can help with the burning/stinging when you urinate. 3. Hydrocodone-acetaminophen is for moderate/severe pain, otherwise taking up to 1000 mg every 6 hours of plain Tylenol (acetaminophen) will help treat your pain.   4. Take Tamsulosin can help with stent pain. 5.  Oxybuytnin can help with overactive bladder   ACTIVITY:  1. No strenuous activity x 1week  2. No driving while on narcotic pain medications  3. Drink plenty of water  4. Continue to walk at home - you can still get blood clots when you are at home, so keep active, but don't over do it.  5. May return to work/school tomorrow or when you feel ready   BATHING:  1. You can shower and we recommend daily showers    SIGNS/SYMPTOMS TO CALL:  Please call us if you have a fever greater than 101.5, uncontrolled nausea/vomiting, uncontrolled pain, dizziness, unable to urinate, bloody urine, chest pain, shortness of breath, leg swelling, leg pain, redness around wound, drainage from wound, or any other concerns or questions.   You can reach [place] at [phone].   FOLLOW-UP:  1.  You will be contacted with your next surgery date.

## 2022-04-17 ENCOUNTER — Encounter (HOSPITAL_COMMUNITY): Payer: Self-pay | Admitting: Urology

## 2022-04-18 DIAGNOSIS — R102 Pelvic and perineal pain: Secondary | ICD-10-CM | POA: Diagnosis not present

## 2022-04-19 ENCOUNTER — Other Ambulatory Visit: Payer: Self-pay | Admitting: Urology

## 2022-04-30 NOTE — Progress Notes (Addendum)
Patient phoned to give updated information on surgery.  Date of Surgery - 05-07-22  Arrival Time - check in at admitting at 1:00 PM.    NPO Status - patient reminded to not eat solid food after midnight.  From midnight until 9:15 AM day of surgery may have clear liquids  Medications morning of surgery - Bupropion, Oxybutynin, Alprazolam, Hydrocodone and okay to use inhalers  Patient is to stay on [place] per patient, will not take the morning of surgery.    No change in medical history, allergies per patient.  Transportation home - Husband, Jaicee Michelotti.  All questions answered and patient stated understanding

## 2022-05-07 ENCOUNTER — Ambulatory Visit (HOSPITAL_COMMUNITY): Payer: BC Managed Care – PPO | Admitting: Anesthesiology

## 2022-05-07 ENCOUNTER — Ambulatory Visit (HOSPITAL_COMMUNITY)
Admission: RE | Admit: 2022-05-07 | Discharge: 2022-05-07 | Disposition: A | Discharge status: Home / Self Care | Payer: BC Managed Care – PPO | Attending: Urology | Admitting: Urology

## 2022-05-07 ENCOUNTER — Other Ambulatory Visit: Payer: Self-pay

## 2022-05-07 ENCOUNTER — Encounter (HOSPITAL_COMMUNITY): Payer: Self-pay | Admitting: Urology

## 2022-05-07 ENCOUNTER — Ambulatory Visit (HOSPITAL_COMMUNITY): Payer: BC Managed Care – PPO

## 2022-05-07 ENCOUNTER — Encounter (HOSPITAL_COMMUNITY)
Admission: RE | Disposition: A | Discharge status: Home / Self Care | Payer: Self-pay | Source: Home / Self Care | Attending: Urology

## 2022-05-07 DIAGNOSIS — F17210 Nicotine dependence, cigarettes, uncomplicated: Secondary | ICD-10-CM | POA: Diagnosis not present

## 2022-05-07 DIAGNOSIS — F32A Depression, unspecified: Secondary | ICD-10-CM | POA: Insufficient documentation

## 2022-05-07 DIAGNOSIS — I10 Essential (primary) hypertension: Secondary | ICD-10-CM | POA: Diagnosis not present

## 2022-05-07 DIAGNOSIS — R310 Gross hematuria: Secondary | ICD-10-CM | POA: Insufficient documentation

## 2022-05-07 DIAGNOSIS — N200 Calculus of kidney: Secondary | ICD-10-CM | POA: Diagnosis present

## 2022-05-07 DIAGNOSIS — M1990 Unspecified osteoarthritis, unspecified site: Secondary | ICD-10-CM | POA: Insufficient documentation

## 2022-05-07 DIAGNOSIS — F419 Anxiety disorder, unspecified: Secondary | ICD-10-CM | POA: Diagnosis not present

## 2022-05-07 HISTORY — PX: CYSTOSCOPY/URETEROSCOPY/HOLMIUM LASER/STENT PLACEMENT: SHX6546

## 2022-05-07 LAB — BASIC METABOLIC PANEL
Anion gap: 10 (ref 5–15)
BUN: 10 mg/dL (ref 6–20)
CO2: 25 mmol/L (ref 22–32)
Calcium: 8.9 mg/dL (ref 8.9–10.3)
Chloride: 102 mmol/L (ref 98–111)
Creatinine, Ser: 0.59 mg/dL (ref 0.44–1.00)
GFR, Estimated: >60 mL/min (ref >60)
Glucose, Bld: 102 mg/dL — ABNORMAL HIGH (ref 70–99)
Potassium: 4.6 mmol/L (ref 3.5–5.1)
Sodium: 137 mmol/L (ref 135–145)

## 2022-05-07 SURGERY — CYSTOSCOPY/URETEROSCOPY/HOLMIUM LASER/STENT PLACEMENT
Anesthesia: General | Laterality: Bilateral

## 2022-05-07 MED ORDER — PROPOFOL 10 MG/ML IV BOLUS
INTRAVENOUS | Status: AC
  Filled 2022-05-07: qty 20

## 2022-05-07 MED ORDER — CEFAZOLIN SODIUM-DEXTROSE 2-4 GM/100ML-% IV SOLN
2.0000 g | INTRAVENOUS | Status: DC
  Filled 2022-05-07: qty 100

## 2022-05-07 MED ORDER — OXYCODONE HCL 5 MG/5ML PO SOLN: 5.0000 mg | Freq: Once | ORAL | Status: AC | PRN

## 2022-05-07 MED ORDER — CEFAZOLIN SODIUM 1 G IJ SOLR
INTRAMUSCULAR | Status: AC
  Filled 2022-05-07: qty 10

## 2022-05-07 MED ORDER — DEXTROSE 5 % IV SOLN
INTRAVENOUS | Status: DC | PRN
  Administered 2022-05-07: 3 g via INTRAVENOUS

## 2022-05-07 MED ORDER — PROPOFOL 10 MG/ML IV BOLUS
INTRAVENOUS | Status: DC | PRN
  Administered 2022-05-07: 200 mg via INTRAVENOUS

## 2022-05-07 MED ORDER — ONDANSETRON HCL 4 MG/2ML IJ SOLN
INTRAMUSCULAR | Status: AC
  Filled 2022-05-07: qty 2

## 2022-05-07 MED ORDER — KETOROLAC TROMETHAMINE 30 MG/ML IJ SOLN
INTRAMUSCULAR | Status: AC
  Filled 2022-05-07: qty 1

## 2022-05-07 MED ORDER — MIRABEGRON ER 25 MG PO TB24
50.0000 mg | ORAL_TABLET | Freq: Every day | ORAL | Status: DC
  Administered 2022-05-07: 50 mg via ORAL
  Filled 2022-05-07: qty 2

## 2022-05-07 MED ORDER — LACTATED RINGERS IV SOLN: INTRAVENOUS | Status: DC

## 2022-05-07 MED ORDER — ORAL CARE MOUTH RINSE: 15.0000 mL | Freq: Once | OROMUCOSAL | Status: AC

## 2022-05-07 MED ORDER — MIDAZOLAM HCL 5 MG/5ML IJ SOLN
INTRAMUSCULAR | Status: DC | PRN
  Administered 2022-05-07: 2 mg via INTRAVENOUS

## 2022-05-07 MED ORDER — ROCURONIUM BROMIDE 100 MG/10ML IV SOLN
INTRAVENOUS | Status: DC | PRN
  Administered 2022-05-07: 70 mg via INTRAVENOUS

## 2022-05-07 MED ORDER — CHLORHEXIDINE GLUCONATE 0.12 % MT SOLN
15.0000 mL | Freq: Once | OROMUCOSAL | Status: AC
  Administered 2022-05-07: 15 mL via OROMUCOSAL

## 2022-05-07 MED ORDER — KETAMINE HCL 50 MG/5ML IJ SOSY
PREFILLED_SYRINGE | INTRAMUSCULAR | Status: AC
  Filled 2022-05-07: qty 5

## 2022-05-07 MED ORDER — OXYCODONE HCL 5 MG PO TABS: 5.0000 mg | ORAL_TABLET | Freq: Once | ORAL | Status: AC | PRN

## 2022-05-07 MED ORDER — PROMETHAZINE HCL 25 MG/ML IJ SOLN: 6.2500 mg | INTRAMUSCULAR | Status: DC | PRN

## 2022-05-07 MED ORDER — KETAMINE HCL 10 MG/ML IJ SOLN
INTRAMUSCULAR | Status: DC | PRN
  Administered 2022-05-07: 30 mg via INTRAVENOUS

## 2022-05-07 MED ORDER — AMISULPRIDE (ANTIEMETIC) 5 MG/2ML IV SOLN: 10.0000 mg | Freq: Once | INTRAVENOUS | Status: DC | PRN

## 2022-05-07 MED ORDER — ROCURONIUM BROMIDE 10 MG/ML (PF) SYRINGE
PREFILLED_SYRINGE | INTRAVENOUS | Status: AC
  Filled 2022-05-07: qty 20

## 2022-05-07 MED ORDER — FENTANYL CITRATE (PF) 100 MCG/2ML IJ SOLN
INTRAMUSCULAR | Status: AC
  Filled 2022-05-07: qty 2

## 2022-05-07 MED ORDER — 0.9 % SODIUM CHLORIDE (POUR BTL) OPTIME
TOPICAL | Status: DC | PRN
  Administered 2022-05-07: 1000 mL

## 2022-05-07 MED ORDER — ACETAMINOPHEN 10 MG/ML IV SOLN: 1000.0000 mg | Freq: Once | INTRAVENOUS | Status: DC | PRN

## 2022-05-07 MED ORDER — SUGAMMADEX SODIUM 200 MG/2ML IV SOLN
INTRAVENOUS | Status: DC | PRN
  Administered 2022-05-07: 400 mg via INTRAVENOUS

## 2022-05-07 MED ORDER — FENTANYL CITRATE (PF) 100 MCG/2ML IJ SOLN
INTRAMUSCULAR | Status: DC | PRN
  Administered 2022-05-07 (×4): 50 ug via INTRAVENOUS

## 2022-05-07 MED ORDER — OXYCODONE HCL 5 MG PO TABS
ORAL_TABLET | ORAL | Status: AC
  Administered 2022-05-07: 5 mg via ORAL
  Filled 2022-05-07: qty 1

## 2022-05-07 MED ORDER — KETOROLAC TROMETHAMINE 15 MG/ML IJ SOLN
INTRAMUSCULAR | Status: DC | PRN
  Administered 2022-05-07: 15 mg via INTRAVENOUS

## 2022-05-07 MED ORDER — ACETAMINOPHEN 325 MG PO TABS: 325.0000 mg | ORAL_TABLET | ORAL | Status: DC | PRN

## 2022-05-07 MED ORDER — ACETAMINOPHEN 160 MG/5ML PO SOLN: 325.0000 mg | ORAL | Status: DC | PRN

## 2022-05-07 MED ORDER — SODIUM CHLORIDE 0.9 % IR SOLN
Status: DC | PRN
  Administered 2022-05-07: 6000 mL via INTRAVESICAL

## 2022-05-07 MED ORDER — DEXAMETHASONE SODIUM PHOSPHATE 10 MG/ML IJ SOLN
INTRAMUSCULAR | Status: AC
  Filled 2022-05-07: qty 1

## 2022-05-07 MED ORDER — MIDAZOLAM HCL 2 MG/2ML IJ SOLN
INTRAMUSCULAR | Status: AC
  Filled 2022-05-07: qty 2

## 2022-05-07 MED ORDER — LIDOCAINE HCL (CARDIAC) PF 100 MG/5ML IV SOSY
PREFILLED_SYRINGE | INTRAVENOUS | Status: DC | PRN
  Administered 2022-05-07: 60 mg via INTRAVENOUS

## 2022-05-07 MED ORDER — DEXTROSE 5 % IV SOLN: INTRAVENOUS | Status: DC | PRN

## 2022-05-07 MED ORDER — DEXAMETHASONE SODIUM PHOSPHATE 4 MG/ML IJ SOLN
INTRAMUSCULAR | Status: DC | PRN
  Administered 2022-05-07: 10 mg via INTRAVENOUS

## 2022-05-07 MED ORDER — ACETAMINOPHEN 500 MG PO TABS
ORAL_TABLET | ORAL | Status: AC
  Filled 2022-05-07: qty 2

## 2022-05-07 MED ORDER — ONDANSETRON HCL 4 MG/2ML IJ SOLN
INTRAMUSCULAR | Status: DC | PRN
  Administered 2022-05-07: 4 mg via INTRAVENOUS

## 2022-05-07 MED ORDER — FENTANYL CITRATE PF 50 MCG/ML IJ SOSY: 25.0000 ug | PREFILLED_SYRINGE | INTRAMUSCULAR | Status: DC | PRN

## 2022-05-07 MED ORDER — ACETAMINOPHEN 500 MG PO TABS
1000.0000 mg | ORAL_TABLET | Freq: Once | ORAL | Status: AC
  Administered 2022-05-07: 1000 mg via ORAL

## 2022-05-07 SURGICAL SUPPLY — 22 items
BAG URO CATCHER STRL LF (MISCELLANEOUS) ×1 IMPLANT
BASKET ZERO TIP NITINOL 2.4FR (BASKET) IMPLANT
BSKT STON RTRVL ZERO TP 2.4FR (BASKET) ×1
CATH URETL OPEN 5X70 (CATHETERS) ×1 IMPLANT
CLOTH BEACON ORANGE TIMEOUT ST (SAFETY) ×1 IMPLANT
DRSG TEGADERM 2-3/8X2-3/4 SM (GAUZE/BANDAGES/DRESSINGS) IMPLANT
EXTRACTOR STONE 1.7FRX115CM (UROLOGICAL SUPPLIES) IMPLANT
GLOVE BIO SURGEON STRL SZ 6.5 (GLOVE) ×1 IMPLANT
GOWN STRL REUS W/ TWL LRG LVL3 (GOWN DISPOSABLE) ×1 IMPLANT
GOWN STRL REUS W/TWL LRG LVL3 (GOWN DISPOSABLE) ×1
GUIDEWIRE STR DUAL SENSOR (WIRE) ×1 IMPLANT
KIT TURNOVER KIT A (KITS) IMPLANT
MANIFOLD NEPTUNE II (INSTRUMENTS) ×1 IMPLANT
PACK CYSTO (CUSTOM PROCEDURE TRAY) ×1 IMPLANT
SHEATH NAVIGATOR HD 11/13X28 (SHEATH) IMPLANT
SHEATH NAVIGATOR HD 11/13X36 (SHEATH) IMPLANT
SHEATH NAVIGATOR HD 12/14X28 (SHEATH) IMPLANT
STENT URET 6FRX26 CONTOUR (STENTS) IMPLANT
TRACTIP FLEXIVA PULS ID 200XHI (Laser) IMPLANT
TRACTIP FLEXIVA PULSE ID 200 (Laser) ×1
TUBING CONNECTING 10 (TUBING) ×1 IMPLANT
TUBING UROLOGY SET (TUBING) ×1 IMPLANT

## 2022-05-07 NOTE — Anesthesia Postprocedure Evaluation (Signed)
Anesthesia Post Note  Patient: Stephanie Harper  Procedure(s) Performed: DIAGNOSTIC RIGHT URETEROSCOPY WITH STENT EXCHANGE, DIAGNOSTIC LEFT URETEROSCOPY WITH BIOPSY, HOLMIUM LASER LITHOTRIPSY  AND STENT EXCHANGE (Bilateral)     Patient location during evaluation: PACU Anesthesia Type: General Level of consciousness: awake and alert Pain management: pain level controlled Vital Signs Assessment: post-procedure vital signs reviewed and stable Respiratory status: spontaneous breathing, nonlabored ventilation, respiratory function stable and patient connected to nasal cannula oxygen Cardiovascular status: blood pressure returned to baseline and stable Postop Assessment: no apparent nausea or vomiting Anesthetic complications: no  No notable events documented.  Last Vitals:  Vitals:   05/07/22 1715 05/07/22 1738  BP: (!) 185/104 (!) 172/100  Pulse: 89 87  Resp: 16 18  Temp:  36.4 C  SpO2: 94% 96%    Last Pain:  Vitals:   05/07/22 1738  TempSrc: Oral  PainSc: 3                  Shelton Silvas

## 2022-05-07 NOTE — Anesthesia Procedure Notes (Signed)
Procedure Name: Intubation Date/Time: 05/07/2022 3:10 PM  Performed by: Johnette Abraham, CRNAPre-anesthesia Checklist: Patient identified, Emergency Drugs available, Suction available and Patient being monitored Patient Re-evaluated:Patient Re-evaluated prior to induction Oxygen Delivery Method: Circle System Utilized Preoxygenation: Pre-oxygenation with 100% oxygen Induction Type: IV induction Ventilation: Mask ventilation without difficulty Laryngoscope Size: Glidescope and 3 Grade View: Grade I Tube type: Oral Tube size: 7.0 mm Number of attempts: 1 Airway Equipment and Method: Stylet and Oral airway Placement Confirmation: ETT inserted through vocal cords under direct vision, positive ETCO2 and breath sounds checked- equal and bilateral Secured at: 22 cm Tube secured with: Tape Dental Injury: Teeth and Oropharynx as per pre-operative assessment

## 2022-05-07 NOTE — Interval H&P Note (Signed)
History and Physical Interval Note: Patient here for staged procedure after underwent ureteral stent placement 3 weeks ago.   05/07/2022 2:52 PM  [name]  has presented today for surgery, with the diagnosis of GROSS HEMATURIA LEFT RENAL CALCULUS.  The various methods of treatment have been discussed with the patient and family. After consideration of risks, benefits and other options for treatment, the patient has consented to  Procedure(s) with comments: CYSTOSCOPY BILATERAL URETEROSCOPY, LEFT HOLMIUM LASER LITHOTRIPSY,  BILATERAL URETERAL STENT EXCHANGE, AND BLADDER BIOPSY (Bilateral) - 90 MINUTES as a surgical intervention.  The patient's history has been reviewed, patient examined, no change in status, stable for surgery.  I have reviewed the patient's chart and labs.  Questions were answered to the patient's satisfaction.     Neda Willenbring D Meshilem Machuca

## 2022-05-07 NOTE — Progress Notes (Signed)
32 Spoke with Dr. Hart Rochester re: Mrs. Stephanie Harper feeling anxious about her surgery and "stating she wish she had taking one of her anxiety pills before coming to to the hospital."   Dr. Hart Rochester did not want her to take a Xanax and he, will come over and speak with her soon. Mrs. Told that the anesthesiologist did not want her to take a Xanax at this time and he was coming to talk with her. Her husband is at the bedside, she appeared less anxious talking with her husband.

## 2022-05-07 NOTE — Op Note (Signed)
Preoperative diagnosis: Gross hematuria, left renal calculus   Postoperative diagnosis: Gross hematuria, left renal calculus, abnormal left renal mucosa   Procedure:   Cystoscopy Diagnostic Right ureteroscopy Left diagnostic ureteroscopy, renal pelvis biopsy, laser lithotripsy Bilateral ureteral stent exchange - 6Fr x 26 cm JJ no tether    Surgeon: Kasandra Knudsen, MD   Anesthesia: General   Complications: None   Intraoperative findings:  Normal urethra Bilateral orthotropic ureteral orifices Previously seen bladder lesion no longer present and likely scope trauma Sloughing/irregular left renal pelvis mucosa   EBL: Minimal   Specimens: Left renal pelvis biopsy Left renal calculus   Disposition of specimens: [facility] for stone analysis   Indication: Stephanie Harper is a 52 y.o.   patient with a gross hematuria who previously underwent diagnostic ureteroscopy and stent placement.  The renal pelvises were not accessible with the ureteroscope at that time and she now returns for staged procedure.  After reviewing the management options for treatment, the patient elected to proceed with the above surgical procedure(s). We have discussed the potential benefits and risks of the procedure, side effects of the proposed treatment, the likelihood of the patient achieving the goals of the procedure, and any potential problems that might occur during the procedure or recuperation. Informed consent has been obtained.     Description of procedure:   The patient was taken to the operating room and general anesthesia was induced.  The patient was placed in the dorsal lithotomy position, prepped and draped in the usual sterile fashion, and preoperative antibiotics were administered. A preoperative time-out was performed.    Cystourethroscopy was performed.  The patient's urethra was examined and was normal.  The bladder was then systematically examined in its entirety.  There was no evidence for any bladder tumors, stones, or other mucosal pathology.     Attention then turned to the left ureteral orifice and the existing left ureteral stent was grasped with a grasper and brought to the urethral meatus.  A 0.38 sensor wire was advanced through the ureteral stent into the ureter and up to the kidney with fluoroscopic guidance.  The stent was removed.  It was examined and deemed to be intact.  Next a second wire was placed alongside the first wire and advanced the kidney with fluoroscopic guidance.  The cystoscope was removed.  1 wire was secured as a safety wire.  A ureteral access sheath was placed over the second wire and advanced to the proximal ureter with fluoroscopic guidance.  The inner sheath and wire removed.  Next flexible ureteroscopy took place. There was sloughing, abnormal renal mucosa at the UPJ and renal pelvis.  Visualization was difficult due to bleeding.  Biopsy forceps were used to biopsy this abnormal renal tissue. Flexible ureteroscopy then continued until third known renal calculus was seen in the lower pole.  A 242 m holmium laser fiber was then used to fragment the stone into small pieces.  A 0 tip basket was used to remove one of the larger fragments to send for analysis.  All remaining fragments were less than 1 mm.  The ureteroscope was then removed and unison with the access sheath taking care to examine the ureter on the way out.  The UPJ had moderate edema but no distinct injury.   The wire was then backloaded through the cystoscope and a ureteral stent was advance over the wire using Seldinger technique.  The stent was positioned appropriately under fluoroscopic and cystoscopic guidance.  The wire  was then removed with an adequate stent curl noted in the renal pelvis as well as in the bladder.  Due to the UPJ edema the decision was made to not leave a tether on the stent so that it does not accidentally fall out.   Next attention turned to  the right side.  The existing stent was grasped with a grasper and brought to the meatus in a similar manner to the left.  A sensor wire was advanced through the ureteral stent to the kidney with fluoroscopy.  The stent was removed, examined and deemed to be intact.  A second wire was placed alongside the first wire and secured as a safety wire.  A ureteral access sheath was then placed over the second sensor wire to the proximal ureter with fluoroscopy.  The inner sheath and wire removed.  Flexible ureteroscopy then took place.  There is no abnormality noted in the right kidney and no stones.  The ureteral access sheath was then removed in unison with the ureteroscope taking care examine the ureter on the way out..  No trauma or injuries noted to the ureter.  A 6 French by 26 cm right double-J stent was placed in standard fashion.  A proximal curl seen in the kidney with fluoroscopy and distal curl seen under direct visualization.  No tether was left on the stent.  The bladder was then emptied and the procedure ended.  The patient appeared to tolerate the procedure well and without complications.  The patient was able to be awakened and transferred to the recovery unit in satisfactory condition.    Disposition: Plan for stent removal in 3-5 days via cystoscopy.

## 2022-05-07 NOTE — Transfer of Care (Signed)
Immediate Anesthesia Transfer of Care Note  Patient: Stephanie Harper  Procedure(s) Performed: CYSTOSCOPY BILATERAL URETEROSCOPY, LEFT HOLMIUM LASER LITHOTRIPSY,  BILATERAL URETERAL STENT EXCHANGE, AND BLADDER BIOPSY DIAGNOSTIC RIGHT URETEROSCOPY WITH STENT PLACEMENT, DIAGNOSTIC LEFT URETEROSCOPY WITH (Bilateral)  Patient Location: PACU  Anesthesia Type:General  Level of Consciousness: awake, alert , oriented, and patient cooperative  Airway & Oxygen Therapy: Patient Spontanous Breathing and Patient connected to face mask oxygen  Post-op Assessment: Report given to RN, Post -op Vital signs reviewed and stable, and Patient moving all extremities  Post vital signs: Reviewed and stable  Last Vitals:  Vitals Value Taken Time  BP 157/90 05/07/22 1622  Temp    Pulse 100 05/07/22 1625  Resp 15 05/07/22 1625  SpO2 98 % 05/07/22 1625  Vitals shown include unvalidated device data.  Last Pain:  Vitals:   05/07/22 1334  TempSrc:   PainSc: 6       Patients Stated Pain Goal: 0 (05/07/22 1334)  Complications: No notable events documented.

## 2022-05-07 NOTE — Anesthesia Preprocedure Evaluation (Addendum)
Anesthesia Evaluation  Patient identified by MRN, date of birth, ID band Patient awake    Reviewed: Allergy & Precautions, NPO status , Patient's Chart, lab work & pertinent test results  Airway Mallampati: II  TM Distance: >3 FB Neck ROM: Full    Dental  (+) Teeth Intact, Dental Advisory Given   Pulmonary asthma , Current Smoker and Patient abstained from smoking.   breath sounds clear to auscultation       Cardiovascular hypertension, Pt. on medications  Rhythm:Regular Rate:Normal     Neuro/Psych  PSYCHIATRIC DISORDERS Anxiety Depression       GI/Hepatic negative GI ROS, Neg liver ROS,,,  Endo/Other  negative endocrine ROS    Renal/GU negative Renal ROS     Musculoskeletal  (+) Arthritis ,    Abdominal   Peds  Hematology negative hematology ROS (+)   Anesthesia Other Findings   Reproductive/Obstetrics                             Anesthesia Physical Anesthesia Plan  ASA: 3  Anesthesia Plan: General   Post-op Pain Management: Tylenol PO (pre-op)*   Induction: Intravenous  PONV Risk Score and Plan: 3 and Ondansetron, Dexamethasone and Midazolam  Airway Management Planned: Oral ETT and Video Laryngoscope Planned  Additional Equipment: None  Intra-op Plan:   Post-operative Plan: Extubation in OR  Informed Consent: I have reviewed the patients History and Physical, chart, labs and discussed the procedure including the risks, benefits and alternatives for the proposed anesthesia with the patient or authorized representative who has indicated his/her understanding and acceptance.     Dental advisory given  Plan Discussed with: CRNA  Anesthesia Plan Comments:        Anesthesia Quick Evaluation

## 2022-05-08 ENCOUNTER — Encounter (HOSPITAL_COMMUNITY): Payer: Self-pay | Admitting: Urology

## 2022-05-10 LAB — SURGICAL PATHOLOGY

## 2022-05-15 LAB — CALCULI, WITH PHOTOGRAPH (CLINICAL LAB)
Calcium Oxalate Dihydrate: 30 %
Calcium Oxalate Monohydrate: 70 %
Weight Calculi: 12 mg

## 2022-05-28 ENCOUNTER — Encounter: Payer: Self-pay | Admitting: Internal Medicine

## 2022-05-28 ENCOUNTER — Ambulatory Visit (INDEPENDENT_AMBULATORY_CARE_PROVIDER_SITE_OTHER): Payer: BC Managed Care – PPO | Admitting: Internal Medicine

## 2022-05-28 VITALS — BP 120/82 | HR 85 | Temp 98.6°F | Ht 66.0 in | Wt 381.0 lb

## 2022-05-28 DIAGNOSIS — M5440 Lumbago with sciatica, unspecified side: Secondary | ICD-10-CM

## 2022-05-28 DIAGNOSIS — F419 Anxiety disorder, unspecified: Secondary | ICD-10-CM | POA: Diagnosis not present

## 2022-05-28 DIAGNOSIS — R310 Gross hematuria: Secondary | ICD-10-CM

## 2022-05-28 DIAGNOSIS — G8929 Other chronic pain: Secondary | ICD-10-CM

## 2022-05-28 MED ORDER — HYDROCODONE-ACETAMINOPHEN 5-325 MG PO TABS: 1.0000 | ORAL_TABLET | Freq: Four times a day (QID) | ORAL | 0 refills | Status: DC | PRN

## 2022-05-28 MED ORDER — ALPRAZOLAM 1 MG PO TABS: ORAL_TABLET | ORAL | 3 refills | Status: DC

## 2022-05-28 NOTE — Progress Notes (Unsigned)
Subjective:  Patient ID: Stephanie Harper, female    DOB: 22-Jan-1970  Age: 52 y.o. MRN: [id number]  CC: Follow-up (3 mnth f/u)   HPI Stephanie Harper presents for LBP, anxiety, depression Recent urinary stone procedure  Outpatient Medications Prior to Visit  Medication Sig Dispense Refill   albuterol (VENTOLIN HFA) 108 (90 Base) MCG/ACT inhaler Inhale 2 puffs into the lungs every 6 (six) hours as needed. 6.7 each 2   BREO ELLIPTA 100-25 MCG/ACT AEPB INHALE 1 PUFF BY MOUTH EVERY DAY (Patient taking differently: Inhale 1 puff into the lungs daily as needed (respiratory issues.).) 180 each 3   buPROPion ER (WELLBUTRIN SR) 100 MG 12 hr tablet TAKE 1 TABLET BY MOUTH TWICE A DAY 180 tablet 2   Cholecalciferol (VITAMIN D3) 50 MCG (2000 UT) capsule Take 1 capsule (2,000 Units total) by mouth daily. 100 capsule 3   escitalopram (LEXAPRO) 20 MG tablet TAKE 1 TABLET BY MOUTH EVERY DAY (Patient taking differently: Take 20 mg by mouth at bedtime.) 90 tablet 2   furosemide (LASIX) 40 MG tablet Take 1 tablet (40 mg total) by mouth daily as needed. (Patient taking differently: Take 40 mg by mouth daily as needed for fluid.) 90 tablet 3   HYDROcodone-acetaminophen (NORCO) 10-325 MG tablet Take 0.5 tablets by mouth every 6 (six) hours as needed. 60 tablet 0   HYDROcodone-acetaminophen (NORCO) 10-325 MG tablet Take 0.5 tablets by mouth every 6 (six) hours as needed for severe pain. 60 tablet 0   HYDROcodone-acetaminophen (NORCO) 10-325 MG tablet Take 0.5 tablets by mouth every 6 (six) hours as needed. 60 tablet 0   HYDROcodone-acetaminophen (NORCO/VICODIN) 5-325 MG tablet Take 1-2 tablets by mouth 2 (two) times daily as needed for severe pain. 120 tablet 0   HYDROcodone-acetaminophen (NORCO/VICODIN) 5-325 MG tablet Take 1 tablet by mouth every 4 (four) hours as needed for moderate pain. 20 tablet 0   ibuprofen (ADVIL) 600 MG tablet TAKE 1 TABLET (600 MG TOTAL) BY MOUTH 2 (TWO) TIMES DAILY AS NEEDED. 180  tablet 1   Melatonin 10 MG CAPS Take 10 mg by mouth at bedtime.     Multiple Vitamin (MULTIVITAMIN) tablet Take 1 tablet by mouth in the morning.     niacinamide 500 MG tablet Take 500 mg by mouth 2 (two) times daily.      pentoxifylline (TRENTAL) 400 MG CR tablet Take 400 mg by mouth in the morning and at bedtime.     triamterene-hydrochlorothiazide (MAXZIDE-25) 37.5-25 MG tablet Take 0.5-1 tablets by mouth daily. (Patient taking differently: Take 0.5 tablets by mouth in the morning.) 90 tablet 3   vitamin B-12 (CYANOCOBALAMIN) 1000 MCG tablet Take 1,000 mcg by mouth in the morning.     ALPRAZolam Prudy Feeler) 1 MG tablet May take 1 tablet (1 mg total) by mouth 3 (three) times daily as needed. May also take 1 tablet (1 mg total) 3 (three) times daily as needed. (Patient taking differently: Take 1 tablet (1 mg) by mouth scheduled at bedtime, may take 2 additional dose during the day if needed for anxiety.) 90 tablet 3   meloxicam (MOBIC) 15 MG tablet TAKE 1 TABLET (15 MG TOTAL) BY MOUTH DAILY. (Patient not taking: Reported on 05/28/2022) 30 tablet 3   Vitamin D, Ergocalciferol, (DRISDOL) 1.25 MG (50000 UNIT) CAPS capsule TAKE 1 CAPSULE (50,000 UNITS TOTAL) BY MOUTH EVERY 7 (SEVEN) DAYS (Patient not taking: Reported on 05/28/2022) 4 capsule 3   ketoconazole (NIZORAL) 2 % cream Apply 1 Application topically  daily. (Patient not taking: Reported on 05/02/2022) 45 g 1   ketoconazole (NIZORAL) 200 MG tablet Take 1 tablet (200 mg total) by mouth daily. (Patient not taking: Reported on 04/05/2022) 14 tablet 0   oxybutynin (DITROPAN) 5 MG tablet Take 1 tablet (5 mg total) by mouth every 8 (eight) hours as needed for bladder spasms. (Patient not taking: Reported on 05/02/2022) 90 tablet 0   tamsulosin (FLOMAX) 0.4 MG CAPS capsule Take 1 capsule (0.4 mg total) by mouth at bedtime. 30 capsule 0   No facility-administered medications prior to visit.    ROS: Review of Systems  Constitutional:  Negative for activity  change, appetite change, chills, fatigue and unexpected weight change.  HENT:  Negative for congestion, mouth sores and sinus pressure.   Eyes:  Negative for visual disturbance.  Respiratory:  Negative for cough and chest tightness.   Gastrointestinal:  Negative for abdominal pain and nausea.  Genitourinary:  Negative for difficulty urinating, frequency and vaginal pain.  Musculoskeletal:  Negative for back pain and gait problem.  Skin:  Negative for pallor and rash.  Neurological:  Negative for dizziness, tremors, weakness, numbness and headaches.  Psychiatric/Behavioral:  Negative for confusion, sleep disturbance and suicidal ideas.     Objective:  BP 120/82 (BP Location: Right Arm, Patient Position: Sitting, Cuff Size: Large)   Pulse 85   Temp 98.6 F (37 C) (Oral)   Ht 5\' 6"  (1.676 m)   Wt (!) 381 lb (172.8 kg)   SpO2 98%   BMI 61.50 kg/m   BP Readings from Last 3 Encounters:  05/28/22 120/82  05/07/22 (!) 172/100  04/16/22 (!) 164/99    Wt Readings from Last 3 Encounters:  05/28/22 (!) 381 lb (172.8 kg)  05/07/22 (!) 386 lb (175.1 kg)  04/16/22 (!) 379 lb 3.1 oz (172 kg)    Physical Exam Constitutional:      General: She is not in acute distress.    Appearance: She is well-developed. She is obese.  HENT:     Head: Normocephalic.     Right Ear: External ear normal.     Left Ear: External ear normal.     Nose: Nose normal.  Eyes:     General:        Right eye: No discharge.        Left eye: No discharge.     Conjunctiva/sclera: Conjunctivae normal.     Pupils: Pupils are equal, round, and reactive to light.  Neck:     Thyroid: No thyromegaly.     Vascular: No JVD.     Trachea: No tracheal deviation.  Cardiovascular:     Rate and Rhythm: Normal rate and regular rhythm.     Heart sounds: Normal heart sounds.  Pulmonary:     Effort: No respiratory distress.     Breath sounds: No stridor. No wheezing.  Abdominal:     General: Bowel sounds are normal. There  is no distension.     Palpations: Abdomen is soft. There is no mass.     Tenderness: There is no abdominal tenderness. There is no guarding or rebound.  Musculoskeletal:        General: No tenderness.     Cervical back: Normal range of motion and neck supple. No rigidity.  Lymphadenopathy:     Cervical: No cervical adenopathy.  Skin:    Findings: No erythema or rash.  Neurological:     Cranial Nerves: No cranial nerve deficit.     Motor: No  abnormal muscle tone.     Coordination: Coordination normal.     Deep Tendon Reflexes: Reflexes normal.  Psychiatric:        Behavior: Behavior normal.        Thought Content: Thought content normal.        Judgment: Judgment normal.   LS w/pain  Lab Results  Component Value Date   WBC 12.7 (H) 04/10/2022   HGB 15.1 (H) 04/10/2022   HCT 46.4 (H) 04/10/2022   PLT 318 04/10/2022   GLUCOSE 102 (H) 05/07/2022   CHOL 171 01/24/2021   TRIG 102.0 01/24/2021   HDL 49.50 01/24/2021   LDLCALC 101 (H) 01/24/2021   ALT 21 11/27/2021   AST 19 11/27/2021   NA 137 05/07/2022   K 4.6 05/07/2022   CL 102 05/07/2022   CREATININE 0.59 05/07/2022   BUN 10 05/07/2022   CO2 25 05/07/2022   TSH 3.60 04/23/2021   INR 1.0 08/09/2007   HGBA1C 6.1 01/24/2021    DG C-Arm 1-60 Min-No Report  Result Date: 05/07/2022 Fluoroscopy was utilized by the requesting physician.  No radiographic interpretation.    Assessment & Plan:   Problem List Items Addressed This Visit   None     Meds ordered this encounter  Medications   ALPRAZolam (XANAX) 1 MG tablet    Sig: May take 1 tablet (1 mg total) by mouth 3 (three) times daily as needed. May also take 1 tablet (1 mg total) 3 (three) times daily as needed.    Dispense:  90 tablet    Refill:  3   HYDROcodone-acetaminophen (NORCO/VICODIN) 5-325 MG tablet    Sig: Take 1 tablet by mouth every 6 (six) hours as needed for severe pain.    Dispense:  120 tablet    Refill:  0    Please fill on or after 07/29/22    HYDROcodone-acetaminophen (NORCO) 5-325 MG tablet    Sig: Take 1 tablet by mouth every 6 (six) hours as needed for severe pain.    Dispense:  120 tablet    Refill:  0    Please fill on or after 06/29/22   HYDROcodone-acetaminophen (NORCO) 5-325 MG tablet    Sig: Take 1 tablet by mouth every 6 (six) hours as needed for severe pain.    Dispense:  120 tablet    Refill:  0    Please fill on or after 05/30/22      Follow-up: Return in about 3 months (around 08/28/2022) for a follow-up visit.  Sonda Primes, MD

## 2022-05-29 ENCOUNTER — Encounter: Payer: Self-pay | Admitting: Internal Medicine

## 2022-05-29 NOTE — Assessment & Plan Note (Signed)
Chronic Cont on Norco prn - 10/325 0.5 tab QID  Potential benefits of a long term opioids use as well as potential risks (i.e. addiction risk, apnea etc) and complications (i.e. Somnolence, constipation and others) were explained to the patient and were aknowledged. Blue-Emu cream was recommended to use 2-3 times a day 

## 2022-05-29 NOTE — Assessment & Plan Note (Signed)
Cont on Xanax prn °Potential benefits of a long term benzodiazepines  use as well as potential risks  and complications were explained to the patient and were aknowledged. °

## 2022-05-29 NOTE — Assessment & Plan Note (Signed)
Resolved.  The patient was treated for kidney stone (removed).  Ureter stent was removed as well.  Follow-up with Dr. Arita Miss

## 2022-06-18 ENCOUNTER — Other Ambulatory Visit: Payer: Self-pay | Admitting: Internal Medicine

## 2022-08-01 ENCOUNTER — Other Ambulatory Visit: Payer: Self-pay | Admitting: Internal Medicine

## 2022-08-07 ENCOUNTER — Telehealth: Payer: Self-pay | Admitting: *Deleted

## 2022-08-07 NOTE — Telephone Encounter (Signed)
Rec'd fax stating Hydrocodone 5/325 mg is on back order. Suggest alternative Hydrocodone-Acetamin 5/300 mg

## 2022-08-07 NOTE — Telephone Encounter (Signed)
Okay with me.  Thank you °

## 2022-08-08 NOTE — Telephone Encounter (Signed)
Need new dose sent to [facility]

## 2022-08-09 MED ORDER — HYDROCODONE-ACETAMINOPHEN 10-300 MG PO TABS: 0.5000 | ORAL_TABLET | Freq: Four times a day (QID) | ORAL | 0 refills | Status: DC | PRN

## 2022-08-09 NOTE — Telephone Encounter (Signed)
MD sent new dosage to [facility]Marland KitchenRaechel Chute

## 2022-08-19 ENCOUNTER — Encounter: Payer: Self-pay | Admitting: Internal Medicine

## 2022-08-19 ENCOUNTER — Ambulatory Visit (INDEPENDENT_AMBULATORY_CARE_PROVIDER_SITE_OTHER): Payer: BC Managed Care – PPO | Admitting: Internal Medicine

## 2022-08-19 VITALS — BP 110/60 | HR 68 | Temp 98.0°F | Ht 66.0 in | Wt 368.0 lb

## 2022-08-19 DIAGNOSIS — F419 Anxiety disorder, unspecified: Secondary | ICD-10-CM | POA: Diagnosis not present

## 2022-08-19 DIAGNOSIS — F17200 Nicotine dependence, unspecified, uncomplicated: Secondary | ICD-10-CM

## 2022-08-19 DIAGNOSIS — F329 Major depressive disorder, single episode, unspecified: Secondary | ICD-10-CM

## 2022-08-19 DIAGNOSIS — G8929 Other chronic pain: Secondary | ICD-10-CM

## 2022-08-19 DIAGNOSIS — M5440 Lumbago with sciatica, unspecified side: Secondary | ICD-10-CM

## 2022-08-19 DIAGNOSIS — I10 Essential (primary) hypertension: Secondary | ICD-10-CM

## 2022-08-19 MED ORDER — ALPRAZOLAM 1 MG PO TABS: ORAL_TABLET | ORAL | 3 refills | Status: DC

## 2022-08-19 MED ORDER — IBUPROFEN 600 MG PO TABS: 600.0000 mg | ORAL_TABLET | Freq: Two times a day (BID) | ORAL | 1 refills | Status: DC | PRN

## 2022-08-19 MED ORDER — AZITHROMYCIN 250 MG PO TABS
ORAL_TABLET | ORAL | 0 refills | Status: DC
Start: 2022-08-19 — End: 2022-11-19

## 2022-08-19 MED ORDER — PSEUDOEPHEDRINE HCL ER 120 MG PO TB12: 120.0000 mg | ORAL_TABLET | Freq: Two times a day (BID) | ORAL | 2 refills | Status: DC | PRN

## 2022-08-19 MED ORDER — HYDROCODONE-ACETAMINOPHEN 5-325 MG PO TABS: 1.0000 | ORAL_TABLET | Freq: Four times a day (QID) | ORAL | 0 refills | Status: DC | PRN

## 2022-08-19 MED ORDER — PANTOPRAZOLE SODIUM 40 MG PO TBEC: 40.0000 mg | DELAYED_RELEASE_TABLET | Freq: Every day | ORAL | 3 refills | Status: DC

## 2022-08-19 MED ORDER — HYDROCODONE-ACETAMINOPHEN 5-325 MG PO TABS: 1.0000 | ORAL_TABLET | Freq: Two times a day (BID) | ORAL | 0 refills | Status: DC | PRN

## 2022-08-19 NOTE — Progress Notes (Signed)
Subjective:  Patient ID: Stephanie Harper, female    DOB: 1970-08-05  Age: 52 y.o. MRN: [id number]  CC: Follow-up (3 mnth f/u)   HPI TAMSYN TRISSEL presents for chronic pain, HTN, asthma  Outpatient Medications Prior to Visit  Medication Sig Dispense Refill   albuterol (VENTOLIN HFA) 108 (90 Base) MCG/ACT inhaler TAKE 2 PUFFS BY MOUTH EVERY 6 HOURS AS NEEDED 6.7 each 2   BREO ELLIPTA 100-25 MCG/ACT AEPB INHALE 1 PUFF BY MOUTH EVERY DAY (Patient taking differently: Inhale 1 puff into the lungs daily as needed (respiratory issues.).) 180 each 3   buPROPion ER (WELLBUTRIN SR) 100 MG 12 hr tablet TAKE 1 TABLET BY MOUTH TWICE A DAY 180 tablet 2   Cholecalciferol (VITAMIN D3) 50 MCG (2000 UT) capsule Take 1 capsule (2,000 Units total) by mouth daily. 100 capsule 3   escitalopram (LEXAPRO) 20 MG tablet TAKE 1 TABLET BY MOUTH EVERY DAY (Patient taking differently: Take 20 mg by mouth at bedtime.) 90 tablet 2   furosemide (LASIX) 40 MG tablet Take 1 tablet (40 mg total) by mouth daily as needed. (Patient taking differently: Take 40 mg by mouth daily as needed for fluid.) 90 tablet 3   HYDROcodone-acetaminophen (NORCO) 10-325 MG tablet Take 0.5 tablets by mouth every 6 (six) hours as needed. 60 tablet 0   HYDROcodone-acetaminophen (NORCO) 10-325 MG tablet Take 0.5 tablets by mouth every 6 (six) hours as needed for severe pain. 60 tablet 0   HYDROcodone-acetaminophen (NORCO) 10-325 MG tablet Take 0.5 tablets by mouth every 6 (six) hours as needed. 60 tablet 0   HYDROcodone-acetaminophen (NORCO/VICODIN) 5-325 MG tablet Take 1 tablet by mouth every 4 (four) hours as needed for moderate pain. 20 tablet 0   HYDROcodone-acetaminophen (NORCO/VICODIN) 5-325 MG tablet Take 1 tablet by mouth every 6 (six) hours as needed for severe pain. 120 tablet 0   HYDROcodone-Acetaminophen 10-300 MG TABS Take 0.5 tablets by mouth every 6 (six) hours as needed. 60 tablet 0   ketoconazole (NIZORAL) 200 MG tablet TAKE 1  TABLET BY MOUTH EVERY DAY 14 tablet 0   Melatonin 10 MG CAPS Take 10 mg by mouth at bedtime.     Multiple Vitamin (MULTIVITAMIN) tablet Take 1 tablet by mouth in the morning.     niacinamide 500 MG tablet Take 500 mg by mouth 2 (two) times daily.      pentoxifylline (TRENTAL) 400 MG CR tablet Take 400 mg by mouth in the morning and at bedtime.     triamterene-hydrochlorothiazide (MAXZIDE-25) 37.5-25 MG tablet Take 0.5-1 tablets by mouth daily. (Patient taking differently: Take 0.5 tablets by mouth in the morning.) 90 tablet 3   vitamin B-12 (CYANOCOBALAMIN) 1000 MCG tablet Take 1,000 mcg by mouth in the morning.     ALPRAZolam Prudy Feeler) 1 MG tablet May take 1 tablet (1 mg total) by mouth 3 (three) times daily as needed. May also take 1 tablet (1 mg total) 3 (three) times daily as needed. 90 tablet 3   HYDROcodone-acetaminophen (NORCO) 5-325 MG tablet Take 1 tablet by mouth every 6 (six) hours as needed for severe pain. 120 tablet 0   HYDROcodone-acetaminophen (NORCO) 5-325 MG tablet Take 1 tablet by mouth every 6 (six) hours as needed for severe pain. 120 tablet 0   HYDROcodone-acetaminophen (NORCO/VICODIN) 5-325 MG tablet Take 1-2 tablets by mouth 2 (two) times daily as needed for severe pain. 120 tablet 0   ibuprofen (ADVIL) 600 MG tablet TAKE 1 TABLET (600 MG TOTAL) BY  MOUTH 2 (TWO) TIMES DAILY AS NEEDED. 180 tablet 1   meloxicam (MOBIC) 15 MG tablet TAKE 1 TABLET (15 MG TOTAL) BY MOUTH DAILY. (Patient not taking: Reported on 05/28/2022) 30 tablet 3   Vitamin D, Ergocalciferol, (DRISDOL) 1.25 MG (50000 UNIT) CAPS capsule TAKE 1 CAPSULE (50,000 UNITS TOTAL) BY MOUTH EVERY 7 (SEVEN) DAYS (Patient not taking: Reported on 05/28/2022) 4 capsule 3   No facility-administered medications prior to visit.    ROS: Review of Systems  Constitutional:  Negative for activity change, appetite change, chills, fatigue and unexpected weight change.  HENT:  Negative for congestion, mouth sores and sinus pressure.    Eyes:  Negative for visual disturbance.  Respiratory:  Negative for cough and chest tightness.   Gastrointestinal:  Negative for abdominal pain and nausea.  Genitourinary:  Negative for difficulty urinating, frequency and vaginal pain.  Musculoskeletal:  Positive for arthralgias and back pain. Negative for gait problem.  Skin:  Negative for pallor and rash.  Neurological:  Negative for dizziness, tremors, weakness, numbness and headaches.  Psychiatric/Behavioral:  Negative for confusion, sleep disturbance and suicidal ideas.     Objective:  BP 110/60 (BP Location: Left Arm, Patient Position: Sitting, Cuff Size: Large)   Pulse 68   Temp 98 F (36.7 C) (Oral)   Ht 5\' 6"  (1.676 m)   Wt (!) 368 lb (166.9 kg)   SpO2 95%   BMI 59.40 kg/m   BP Readings from Last 3 Encounters:  08/19/22 110/60  05/28/22 120/82  05/07/22 (!) 172/100    Wt Readings from Last 3 Encounters:  08/19/22 (!) 368 lb (166.9 kg)  05/28/22 (!) 381 lb (172.8 kg)  05/07/22 (!) 386 lb (175.1 kg)    Physical Exam Constitutional:      General: She is not in acute distress.    Appearance: She is well-developed.  HENT:     Head: Normocephalic.     Right Ear: External ear normal.     Left Ear: External ear normal.     Nose: Nose normal.  Eyes:     General:        Right eye: No discharge.        Left eye: No discharge.     Conjunctiva/sclera: Conjunctivae normal.     Pupils: Pupils are equal, round, and reactive to light.  Neck:     Thyroid: No thyromegaly.     Vascular: No JVD.     Trachea: No tracheal deviation.  Cardiovascular:     Rate and Rhythm: Normal rate and regular rhythm.     Heart sounds: Normal heart sounds.  Pulmonary:     Effort: No respiratory distress.     Breath sounds: No stridor. No wheezing.  Abdominal:     General: Bowel sounds are normal. There is no distension.     Palpations: Abdomen is soft. There is no mass.     Tenderness: There is no abdominal tenderness. There is no  guarding or rebound.  Musculoskeletal:        General: No tenderness.     Cervical back: Normal range of motion and neck supple. No rigidity.  Lymphadenopathy:     Cervical: No cervical adenopathy.  Skin:    Findings: No erythema or rash.  Neurological:     Mental Status: She is oriented to person, place, and time.     Cranial Nerves: No cranial nerve deficit.     Motor: No abnormal muscle tone.     Coordination: Coordination normal.  Gait: Gait abnormal.     Deep Tendon Reflexes: Reflexes normal.  Psychiatric:        Behavior: Behavior normal.        Thought Content: Thought content normal.        Judgment: Judgment normal.    Antalgic gait   Lab Results  Component Value Date   WBC 12.7 (H) 04/10/2022   HGB 15.1 (H) 04/10/2022   HCT 46.4 (H) 04/10/2022   PLT 318 04/10/2022   GLUCOSE 102 (H) 05/07/2022   CHOL 171 01/24/2021   TRIG 102.0 01/24/2021   HDL 49.50 01/24/2021   LDLCALC 101 (H) 01/24/2021   ALT 21 11/27/2021   AST 19 11/27/2021   NA 137 05/07/2022   K 4.6 05/07/2022   CL 102 05/07/2022   CREATININE 0.59 05/07/2022   BUN 10 05/07/2022   CO2 25 05/07/2022   TSH 3.60 04/23/2021   INR 1.0 08/09/2007   HGBA1C 6.1 01/24/2021    DG C-Arm 1-60 Min-No Report  Result Date: 05/07/2022 Fluoroscopy was utilized by the requesting physician.  No radiographic interpretation.    Assessment & Plan:   Problem List Items Addressed This Visit     Chronic anxiety - Primary    Chronic  Cont on Xanax prn Potential benefits of a long term benzodiazepines  use as well as potential risks  and complications were explained to the patient and were aknowledged. Situational. Continue with Wellbutrin and Paxil.  Psychology consultation - Seeing Dr Bosie Clos for counseling. Better      Relevant Medications   ALPRAZolam (XANAX) 1 MG tablet   TOBACCO USE DISORDER/SMOKER-SMOKING CESSATION DISCUSSED    It is an ongoing problem.  Discussed      Depression    Chronic Cont  on Wellbutrin, Paxil 6/22 Situational.  It has been worse.  She has been home confined due to high gas prices.  Continue with Wellbutrin and Paxil.  Psychology consultation - Seeing Dr Bosie Clos for counseling. Better      Relevant Medications   ALPRAZolam (XANAX) 1 MG tablet   Essential hypertension    Low-salt diet Furosemide      LOW BACK PAIN    Chronic Cont on Norco prn - 10/325 0.5 tab QID  Potential benefits of a long term opioids use as well as potential risks (i.e. addiction risk, apnea etc) and complications (i.e. Somnolence, constipation and others) were explained to the patient and were aknowledged. Blue-Emu cream was recommended to use 2-3 times a day      Relevant Medications   ibuprofen (ADVIL) 600 MG tablet   HYDROcodone-acetaminophen (NORCO) 5-325 MG tablet   HYDROcodone-acetaminophen (NORCO) 5-325 MG tablet   HYDROcodone-acetaminophen (NORCO/VICODIN) 5-325 MG tablet      Meds ordered this encounter  Medications   ALPRAZolam (XANAX) 1 MG tablet    Sig: May take 1 tablet (1 mg total) by mouth 3 (three) times daily as needed. May also take 1 tablet (1 mg total) 3 (three) times daily as needed.    Dispense:  90 tablet    Refill:  3   ibuprofen (ADVIL) 600 MG tablet    Sig: Take 1 tablet (600 mg total) by mouth 2 (two) times daily as needed.    Dispense:  180 tablet    Refill:  1   HYDROcodone-acetaminophen (NORCO) 5-325 MG tablet    Sig: Take 1 tablet by mouth every 6 (six) hours as needed for severe pain.    Dispense:  120 tablet  Refill:  0    Please fill on or after 10/02/22   HYDROcodone-acetaminophen (NORCO) 5-325 MG tablet    Sig: Take 1 tablet by mouth every 6 (six) hours as needed for severe pain.    Dispense:  120 tablet    Refill:  0    Please fill on or after 11/01/22   HYDROcodone-acetaminophen (NORCO/VICODIN) 5-325 MG tablet    Sig: Take 1-2 tablets by mouth 2 (two) times daily as needed for severe pain.    Dispense:  120 tablet    Refill:   0    Please fill on or after 12/01/22   pantoprazole (PROTONIX) 40 MG tablet    Sig: Take 1 tablet (40 mg total) by mouth daily.    Dispense:  30 tablet    Refill:  3   pseudoephedrine (SUDAFED) 120 MG 12 hr tablet    Sig: Take 1 tablet (120 mg total) by mouth 2 (two) times daily as needed for congestion.    Dispense:  60 tablet    Refill:  2   azithromycin (ZITHROMAX Z-PAK) 250 MG tablet    Sig: As directed    Dispense:  6 tablet    Refill:  0      Follow-up: Return in about 3 months (around 11/19/2022) for a follow-up visit.  Sonda Primes, MD

## 2022-08-19 NOTE — Assessment & Plan Note (Signed)
Chronic  Cont on Xanax prn Potential benefits of a long term benzodiazepines  use as well as potential risks  and complications were explained to the patient and were aknowledged. Situational. Continue with Wellbutrin and Paxil.  Psychology consultation - Seeing Dr Bosie Clos for counseling. Better

## 2022-08-26 ENCOUNTER — Encounter: Payer: Self-pay | Admitting: Internal Medicine

## 2022-08-26 NOTE — Assessment & Plan Note (Signed)
Low-salt diet Furosemide

## 2022-08-26 NOTE — Assessment & Plan Note (Signed)
Chronic Cont on Wellbutrin, Paxil 6/22 Situational.  It has been worse.  She has been home confined due to high gas prices.  Continue with Wellbutrin and Paxil.  Psychology consultation - Seeing Dr Michail Sermon for counseling. Better

## 2022-08-26 NOTE — Assessment & Plan Note (Signed)
It is an ongoing problem.  Discussed

## 2022-08-26 NOTE — Assessment & Plan Note (Signed)
Chronic Cont on Norco prn - 10/325 0.5 tab QID  Potential benefits of a long term opioids use as well as potential risks (i.e. addiction risk, apnea etc) and complications (i.e. Somnolence, constipation and others) were explained to the patient and were aknowledged. Blue-Emu cream was recommended to use 2-3 times a day 

## 2022-10-03 ENCOUNTER — Other Ambulatory Visit: Payer: Self-pay | Admitting: Internal Medicine

## 2022-10-18 ENCOUNTER — Other Ambulatory Visit: Payer: Self-pay | Admitting: Internal Medicine

## 2022-11-19 ENCOUNTER — Ambulatory Visit (INDEPENDENT_AMBULATORY_CARE_PROVIDER_SITE_OTHER): Payer: BC Managed Care – PPO | Admitting: Internal Medicine

## 2022-11-19 ENCOUNTER — Encounter: Payer: Self-pay | Admitting: Internal Medicine

## 2022-11-19 VITALS — Ht 66.0 in

## 2022-11-19 DIAGNOSIS — R1111 Vomiting without nausea: Secondary | ICD-10-CM

## 2022-11-19 DIAGNOSIS — F419 Anxiety disorder, unspecified: Secondary | ICD-10-CM

## 2022-11-19 DIAGNOSIS — I10 Essential (primary) hypertension: Secondary | ICD-10-CM | POA: Diagnosis not present

## 2022-11-19 DIAGNOSIS — Z9884 Bariatric surgery status: Secondary | ICD-10-CM

## 2022-11-19 DIAGNOSIS — F329 Major depressive disorder, single episode, unspecified: Secondary | ICD-10-CM

## 2022-11-19 DIAGNOSIS — M5440 Lumbago with sciatica, unspecified side: Secondary | ICD-10-CM | POA: Diagnosis not present

## 2022-11-19 DIAGNOSIS — Z23 Encounter for immunization: Secondary | ICD-10-CM | POA: Diagnosis not present

## 2022-11-19 DIAGNOSIS — G8929 Other chronic pain: Secondary | ICD-10-CM

## 2022-11-19 LAB — VITAMIN D 25 HYDROXY (VIT D DEFICIENCY, FRACTURES): VITD: 25.48 ng/mL — ABNORMAL LOW (ref 30.00–100.00)

## 2022-11-19 LAB — VITAMIN B12: Vitamin B-12: >1537 pg/mL — ABNORMAL HIGH (ref 211–911)

## 2022-11-19 LAB — TSH: TSH: 2.16 u[IU]/mL (ref 0.35–5.50)

## 2022-11-19 MED ORDER — ALPRAZOLAM 1 MG PO TABS: ORAL_TABLET | ORAL | 3 refills | Status: DC

## 2022-11-19 MED ORDER — HYDROCODONE-ACETAMINOPHEN 5-325 MG PO TABS: 1.0000 | ORAL_TABLET | Freq: Four times a day (QID) | ORAL | 0 refills | Status: DC | PRN

## 2022-11-19 MED ORDER — PSEUDOEPHEDRINE HCL ER 120 MG PO TB12: 120.0000 mg | ORAL_TABLET | Freq: Two times a day (BID) | ORAL | 2 refills | Status: DC | PRN

## 2022-11-19 MED ORDER — BUPROPION HCL ER (SR) 100 MG PO TB12: 100.0000 mg | ORAL_TABLET | Freq: Two times a day (BID) | ORAL | 2 refills | Status: DC

## 2022-11-19 MED ORDER — ESCITALOPRAM OXALATE 20 MG PO TABS: 20.0000 mg | ORAL_TABLET | Freq: Every day | ORAL | 1 refills | Status: DC

## 2022-11-19 MED ORDER — HYDROCODONE-ACETAMINOPHEN 5-325 MG PO TABS: 1.0000 | ORAL_TABLET | ORAL | 0 refills | Status: DC | PRN

## 2022-11-19 NOTE — Assessment & Plan Note (Signed)
Chronic  Cont on Xanax prn Potential benefits of a long term benzodiazepines  use as well as potential risks  and complications were explained to the patient and were aknowledged. Situational. Continue with Wellbutrin and Paxil.  Psychology consultation - Seeing Dr Bosie Clos for counseling. Better

## 2022-11-19 NOTE — Assessment & Plan Note (Signed)
Chronic Cont on Norco prn - 10/325 0.5 tab QID  Potential benefits of a long term opioids use as well as potential risks (i.e. addiction risk, apnea etc) and complications (i.e. Somnolence, constipation and others) were explained to the patient and were aknowledged. Blue-Emu cream was recommended to use 2-3 times a day 

## 2022-11-19 NOTE — Assessment & Plan Note (Signed)
   N/v - emeses w/last portions of food (the band is empty) xx 2 years Surgery ref

## 2022-11-19 NOTE — Progress Notes (Signed)
Subjective:  Patient ID: Stephanie Harper, female    DOB: Aug 27, 1970  Age: 52 y.o. MRN: [id number]  CC: Medical Management of Chronic Issues (3 mnth f/u, discuss lap band and pt still losing weight a/w trouble swallowing)   HPI [name] presents for n/v - emeses w/last portions of food (the band is empty)  Outpatient Medications Prior to Visit  Medication Sig Dispense Refill   albuterol (VENTOLIN HFA) 108 (90 Base) MCG/ACT inhaler TAKE 2 PUFFS BY MOUTH EVERY 6 HOURS AS NEEDED 6.7 each 2   BREO ELLIPTA 100-25 MCG/ACT AEPB INHALE 1 PUFF BY MOUTH EVERY DAY (Patient taking differently: Inhale 1 puff into the lungs daily as needed (respiratory issues.).) 180 each 3   Cholecalciferol (VITAMIN D3) 50 MCG (2000 UT) capsule Take 1 capsule (2,000 Units total) by mouth daily. 100 capsule 3   furosemide (LASIX) 40 MG tablet Take 1 tablet (40 mg total) by mouth daily as needed. (Patient taking differently: Take 40 mg by mouth daily as needed for fluid.) 90 tablet 3   HYDROcodone-acetaminophen (NORCO) 10-325 MG tablet Take 0.5 tablets by mouth every 6 (six) hours as needed. 60 tablet 0   HYDROcodone-acetaminophen (NORCO) 10-325 MG tablet Take 0.5 tablets by mouth every 6 (six) hours as needed for severe pain. 60 tablet 0   HYDROcodone-acetaminophen (NORCO) 10-325 MG tablet Take 0.5 tablets by mouth every 6 (six) hours as needed. 60 tablet 0   HYDROcodone-acetaminophen (NORCO/VICODIN) 5-325 MG tablet Take 1 tablet by mouth every 6 (six) hours as needed for severe pain. 120 tablet 0   HYDROcodone-acetaminophen (NORCO/VICODIN) 5-325 MG tablet Take 1-2 tablets by mouth 2 (two) times daily as needed for severe pain. 120 tablet 0   HYDROcodone-Acetaminophen 10-300 MG TABS Take 0.5 tablets by mouth every 6 (six) hours as needed. 60 tablet 0   ibuprofen (ADVIL) 600 MG tablet Take 1 tablet (600 mg total) by mouth 2 (two) times daily as needed. 180 tablet 1   ketoconazole (NIZORAL) 200 MG tablet TAKE 1  TABLET BY MOUTH EVERY DAY 14 tablet 0   Melatonin 10 MG CAPS Take 10 mg by mouth at bedtime.     meloxicam (MOBIC) 15 MG tablet TAKE 1 TABLET (15 MG TOTAL) BY MOUTH DAILY. 30 tablet 3   Multiple Vitamin (MULTIVITAMIN) tablet Take 1 tablet by mouth in the morning.     niacinamide 500 MG tablet Take 500 mg by mouth 2 (two) times daily.      pantoprazole (PROTONIX) 40 MG tablet Take 1 tablet (40 mg total) by mouth daily. 30 tablet 3   pentoxifylline (TRENTAL) 400 MG CR tablet Take 400 mg by mouth in the morning and at bedtime.     triamterene-hydrochlorothiazide (MAXZIDE-25) 37.5-25 MG tablet TAKE 0.5-1 TABLETS BY MOUTH DAILY. 90 tablet 3   vitamin B-12 (CYANOCOBALAMIN) 1000 MCG tablet Take 1,000 mcg by mouth in the morning.     ALPRAZolam Prudy Feeler) 1 MG tablet May take 1 tablet (1 mg total) by mouth 3 (three) times daily as needed. May also take 1 tablet (1 mg total) 3 (three) times daily as needed. 90 tablet 3   buPROPion ER (WELLBUTRIN SR) 100 MG 12 hr tablet TAKE 1 TABLET BY MOUTH TWICE A DAY 180 tablet 2   escitalopram (LEXAPRO) 20 MG tablet Take 1 tablet (20 mg total) by mouth at bedtime. 90 tablet 1   HYDROcodone-acetaminophen (NORCO) 5-325 MG tablet Take 1 tablet by mouth every 6 (six) hours as needed for severe  pain. 120 tablet 0   HYDROcodone-acetaminophen (NORCO) 5-325 MG tablet Take 1 tablet by mouth every 6 (six) hours as needed for severe pain. 120 tablet 0   HYDROcodone-acetaminophen (NORCO/VICODIN) 5-325 MG tablet Take 1 tablet by mouth every 4 (four) hours as needed for moderate pain. 20 tablet 0   pseudoephedrine (SUDAFED) 120 MG 12 hr tablet Take 1 tablet (120 mg total) by mouth 2 (two) times daily as needed for congestion. 60 tablet 2   azithromycin (ZITHROMAX Z-PAK) 250 MG tablet As directed (Patient not taking: Reported on 11/19/2022) 6 tablet 0   No facility-administered medications prior to visit.    ROS: Review of Systems  Constitutional:  Positive for unexpected weight  change. Negative for activity change, appetite change, chills and fatigue.  HENT:  Negative for congestion, mouth sores and sinus pressure.   Eyes:  Negative for visual disturbance.  Respiratory:  Negative for cough and chest tightness.   Gastrointestinal:  Positive for vomiting. Negative for abdominal pain and nausea.  Genitourinary:  Negative for difficulty urinating, frequency and vaginal pain.  Musculoskeletal:  Positive for arthralgias and back pain. Negative for gait problem.  Skin:  Negative for pallor and rash.  Neurological:  Negative for dizziness, tremors, weakness, numbness and headaches.  Psychiatric/Behavioral:  Negative for confusion and sleep disturbance.     Objective:  Ht 5\' 6"  (1.676 m)   BMI 59.40 kg/m   BP Readings from Last 3 Encounters:  08/19/22 110/60  05/28/22 120/82  05/07/22 (!) 172/100    Wt Readings from Last 3 Encounters:  08/19/22 (!) 368 lb (166.9 kg)  05/28/22 (!) 381 lb (172.8 kg)  05/07/22 (!) 386 lb (175.1 kg)    Physical Exam Constitutional:      General: She is not in acute distress.    Appearance: She is well-developed. She is obese.  HENT:     Head: Normocephalic.     Right Ear: External ear normal.     Left Ear: External ear normal.     Nose: Nose normal.  Eyes:     General:        Right eye: No discharge.        Left eye: No discharge.     Conjunctiva/sclera: Conjunctivae normal.     Pupils: Pupils are equal, round, and reactive to light.  Neck:     Thyroid: No thyromegaly.     Vascular: No JVD.     Trachea: No tracheal deviation.  Cardiovascular:     Rate and Rhythm: Normal rate and regular rhythm.     Heart sounds: Normal heart sounds.  Pulmonary:     Effort: No respiratory distress.     Breath sounds: No stridor. No wheezing.  Abdominal:     General: Bowel sounds are normal. There is no distension.     Palpations: Abdomen is soft. There is no mass.     Tenderness: There is no abdominal tenderness. There is no  guarding or rebound.  Musculoskeletal:        General: Tenderness present.     Cervical back: Normal range of motion and neck supple. No rigidity.  Lymphadenopathy:     Cervical: No cervical adenopathy.  Skin:    Findings: No erythema or rash.  Neurological:     Mental Status: She is oriented to person, place, and time.     Cranial Nerves: No cranial nerve deficit.     Motor: No abnormal muscle tone.     Coordination: Coordination normal.  Deep Tendon Reflexes: Reflexes normal.  Psychiatric:        Behavior: Behavior normal.        Thought Content: Thought content normal.        Judgment: Judgment normal.      Lab Results  Component Value Date   WBC 12.7 (H) 04/10/2022   HGB 15.1 (H) 04/10/2022   HCT 46.4 (H) 04/10/2022   PLT 318 04/10/2022   GLUCOSE 102 (H) 05/07/2022   CHOL 171 01/24/2021   TRIG 102.0 01/24/2021   HDL 49.50 01/24/2021   LDLCALC 101 (H) 01/24/2021   ALT 21 11/27/2021   AST 19 11/27/2021   NA 137 05/07/2022   K 4.6 05/07/2022   CL 102 05/07/2022   CREATININE 0.59 05/07/2022   BUN 10 05/07/2022   CO2 25 05/07/2022   TSH 3.60 04/23/2021   INR 1.0 08/09/2007   HGBA1C 6.1 01/24/2021    DG C-Arm 1-60 Min-No Report  Result Date: 05/07/2022 Fluoroscopy was utilized by the requesting physician.  No radiographic interpretation.    Assessment & Plan:   Problem List Items Addressed This Visit     Chronic anxiety    Chronic  Cont on Xanax prn Potential benefits of a long term benzodiazepines  use as well as potential risks  and complications were explained to the patient and were aknowledged. Situational. Continue with Wellbutrin and Paxil.  Psychology consultation - Seeing Dr Bosie Clos for counseling. Better      Relevant Medications   ALPRAZolam (XANAX) 1 MG tablet   buPROPion ER (WELLBUTRIN SR) 100 MG 12 hr tablet   escitalopram (LEXAPRO) 20 MG tablet   Depression    Chronic Cont on Wellbutrin, Paxil 6/22 Situational.  It has been worse.   She has been home confined due to high gas prices.  Continue with Wellbutrin and Paxil.  Psychology consultation - Seeing Dr Bosie Clos for counseling. Better      Relevant Medications   ALPRAZolam (XANAX) 1 MG tablet   buPROPion ER (WELLBUTRIN SR) 100 MG 12 hr tablet   escitalopram (LEXAPRO) 20 MG tablet   Essential hypertension    Low-salt diet Furosemide      Relevant Orders   TSH   Urinalysis   CBC with Differential/Platelet   Comprehensive metabolic panel   Vitamin B12   VITAMIN D 25 Hydroxy (Vit-D Deficiency, Fractures)   Lipase   LOW BACK PAIN    Chronic Cont on Norco prn - 10/325 0.5 tab QID  Potential benefits of a long term opioids use as well as potential risks (i.e. addiction risk, apnea etc) and complications (i.e. Somnolence, constipation and others) were explained to the patient and were aknowledged. Blue-Emu cream was recommended to use 2-3 times a day      Relevant Medications   HYDROcodone-acetaminophen (NORCO) 5-325 MG tablet   HYDROcodone-acetaminophen (NORCO) 5-325 MG tablet   HYDROcodone-acetaminophen (NORCO/VICODIN) 5-325 MG tablet   Lapband APS Jan 2014      N/v - emeses w/last portions of food (the band is empty) xx 2 years Surgery ref      Relevant Orders   Ambulatory referral to General Surgery   TSH   Urinalysis   CBC with Differential/Platelet   Comprehensive metabolic panel   Vitamin B12   VITAMIN D 25 Hydroxy (Vit-D Deficiency, Fractures)   Lipase   Other Visit Diagnoses     Vomiting without nausea, unspecified vomiting type    -  Primary   Relevant Orders   Ambulatory referral to  General Surgery   TSH   Urinalysis   CBC with Differential/Platelet   Comprehensive metabolic panel   Vitamin B12   VITAMIN D 25 Hydroxy (Vit-D Deficiency, Fractures)   Lipase         Meds ordered this encounter  Medications   ALPRAZolam (XANAX) 1 MG tablet    Sig: May take 1 tablet (1 mg total) by mouth 3 (three) times daily as needed. May also  take 1 tablet (1 mg total) 3 (three) times daily as needed.    Dispense:  90 tablet    Refill:  3   buPROPion ER (WELLBUTRIN SR) 100 MG 12 hr tablet    Sig: Take 1 tablet (100 mg total) by mouth 2 (two) times daily.    Dispense:  180 tablet    Refill:  2   escitalopram (LEXAPRO) 20 MG tablet    Sig: Take 1 tablet (20 mg total) by mouth at bedtime.    Dispense:  90 tablet    Refill:  1   HYDROcodone-acetaminophen (NORCO) 5-325 MG tablet    Sig: Take 1 tablet by mouth every 6 (six) hours as needed for severe pain (pain score 7-10).    Dispense:  120 tablet    Refill:  0    Please fill on or after 11/22/22   HYDROcodone-acetaminophen (NORCO) 5-325 MG tablet    Sig: Take 1 tablet by mouth every 6 (six) hours as needed for severe pain (pain score 7-10).    Dispense:  120 tablet    Refill:  0    Please fill on or after 01/21/22   HYDROcodone-acetaminophen (NORCO/VICODIN) 5-325 MG tablet    Sig: Take 1 tablet by mouth every 4 (four) hours as needed for moderate pain (pain score 4-6).    Dispense:  120 tablet    Refill:  0    Please fill on or after 01/21/23   pseudoephedrine (SUDAFED) 120 MG 12 hr tablet    Sig: Take 1 tablet (120 mg total) by mouth 2 (two) times daily as needed for congestion.    Dispense:  60 tablet    Refill:  2      Follow-up: No follow-ups on file.  Sonda Primes, MD

## 2022-11-19 NOTE — Assessment & Plan Note (Signed)
Chronic Cont on Wellbutrin, Paxil 6/22 Situational.  It has been worse.  She has been home confined due to high gas prices.  Continue with Wellbutrin and Paxil.  Psychology consultation - Seeing Dr Michail Sermon for counseling. Better

## 2022-11-19 NOTE — Assessment & Plan Note (Signed)
Low-salt diet Furosemide

## 2022-11-19 NOTE — Addendum Note (Signed)
Addended by: Delsa Grana R on: 11/19/2022 02:48 PM   Modules accepted: Orders

## 2022-11-20 LAB — COMPREHENSIVE METABOLIC PANEL
ALT: 18 U/L (ref 0–35)
AST: 18 U/L (ref 0–37)
Albumin: 3.9 g/dL (ref 3.5–5.2)
Alkaline Phosphatase: 85 U/L (ref 39–117)
BUN: 9 mg/dL (ref 6–23)
CO2: 31 meq/L (ref 19–32)
Calcium: 9.6 mg/dL (ref 8.4–10.5)
Chloride: 100 meq/L (ref 96–112)
Creatinine, Ser: 0.61 mg/dL (ref 0.40–1.20)
GFR: 103.32 mL/min (ref >60.00)
Glucose, Bld: 113 mg/dL — ABNORMAL HIGH (ref 70–99)
Potassium: 3.5 meq/L (ref 3.5–5.1)
Sodium: 140 meq/L (ref 135–145)
Total Bilirubin: 0.4 mg/dL (ref 0.2–1.2)
Total Protein: 7.2 g/dL (ref 6.0–8.3)

## 2022-11-20 LAB — URINALYSIS
Bilirubin Urine: NEGATIVE
Hgb urine dipstick: NEGATIVE
Ketones, ur: NEGATIVE
Leukocytes,Ua: NEGATIVE
Nitrite: NEGATIVE
Specific Gravity, Urine: 1.02 (ref 1.000–1.030)
Total Protein, Urine: NEGATIVE
Urine Glucose: NEGATIVE
Urobilinogen, UA: 0.2 (ref 0.0–1.0)
pH: 7 (ref 5.0–8.0)

## 2022-11-20 LAB — CBC WITH DIFFERENTIAL/PLATELET
Basophils Absolute: 0.1 10*3/uL (ref 0.0–0.1)
Basophils Relative: 0.5 % (ref 0.0–3.0)
Eosinophils Absolute: 0.1 10*3/uL (ref 0.0–0.7)
Eosinophils Relative: 0.9 % (ref 0.0–5.0)
HCT: 46 % (ref 36.0–46.0)
Hemoglobin: 15 g/dL (ref 12.0–15.0)
Lymphocytes Relative: 19.5 % (ref 12.0–46.0)
Lymphs Abs: 2.6 10*3/uL (ref 0.7–4.0)
MCHC: 32.6 g/dL (ref 30.0–36.0)
MCV: 92 fL (ref 78.0–100.0)
Monocytes Absolute: 0.8 10*3/uL (ref 0.1–1.0)
Monocytes Relative: 6.2 % (ref 3.0–12.0)
Neutro Abs: 9.6 10*3/uL — ABNORMAL HIGH (ref 1.4–7.7)
Neutrophils Relative %: 72.9 % (ref 43.0–77.0)
Platelets: 361 10*3/uL (ref 150.0–400.0)
RBC: 5 Mil/uL (ref 3.87–5.11)
RDW: 13.6 % (ref 11.5–15.5)
WBC: 13.1 10*3/uL — ABNORMAL HIGH (ref 4.0–10.5)

## 2022-11-20 LAB — LIPASE: Lipase: 9 U/L — ABNORMAL LOW (ref 11.0–59.0)

## 2022-11-21 DIAGNOSIS — Z9884 Bariatric surgery status: Secondary | ICD-10-CM | POA: Diagnosis not present

## 2022-11-21 DIAGNOSIS — R1319 Other dysphagia: Secondary | ICD-10-CM | POA: Diagnosis not present

## 2022-11-21 DIAGNOSIS — F17200 Nicotine dependence, unspecified, uncomplicated: Secondary | ICD-10-CM | POA: Diagnosis not present

## 2022-12-17 DIAGNOSIS — Z6841 Body Mass Index (BMI) 40.0 and over, adult: Secondary | ICD-10-CM | POA: Diagnosis not present

## 2022-12-17 DIAGNOSIS — Z01419 Encounter for gynecological examination (general) (routine) without abnormal findings: Secondary | ICD-10-CM | POA: Diagnosis not present

## 2022-12-17 DIAGNOSIS — Z1330 Encounter for screening examination for mental health and behavioral disorders, unspecified: Secondary | ICD-10-CM | POA: Diagnosis not present

## 2022-12-17 DIAGNOSIS — L282 Other prurigo: Secondary | ICD-10-CM | POA: Diagnosis not present

## 2022-12-17 DIAGNOSIS — Z1211 Encounter for screening for malignant neoplasm of colon: Secondary | ICD-10-CM | POA: Diagnosis not present

## 2022-12-17 DIAGNOSIS — Z1231 Encounter for screening mammogram for malignant neoplasm of breast: Secondary | ICD-10-CM | POA: Diagnosis not present

## 2022-12-22 ENCOUNTER — Other Ambulatory Visit: Payer: Self-pay | Admitting: Internal Medicine

## 2022-12-24 ENCOUNTER — Other Ambulatory Visit: Payer: Self-pay | Admitting: Obstetrics and Gynecology

## 2022-12-24 DIAGNOSIS — N6459 Other signs and symptoms in breast: Secondary | ICD-10-CM

## 2022-12-24 DIAGNOSIS — R928 Other abnormal and inconclusive findings on diagnostic imaging of breast: Secondary | ICD-10-CM

## 2023-01-10 ENCOUNTER — Ambulatory Visit: Payer: Self-pay | Admitting: General Surgery

## 2023-01-10 DIAGNOSIS — I10 Essential (primary) hypertension: Secondary | ICD-10-CM

## 2023-01-15 ENCOUNTER — Ambulatory Visit
Admission: RE | Admit: 2023-01-15 | Discharge: 2023-01-15 | Disposition: A | Discharge status: Home / Self Care | Payer: BC Managed Care – PPO | Source: Ambulatory Visit | Attending: Obstetrics and Gynecology | Admitting: Obstetrics and Gynecology

## 2023-01-15 ENCOUNTER — Ambulatory Visit: Payer: BC Managed Care – PPO

## 2023-01-15 DIAGNOSIS — R928 Other abnormal and inconclusive findings on diagnostic imaging of breast: Secondary | ICD-10-CM

## 2023-01-15 DIAGNOSIS — N6459 Other signs and symptoms in breast: Secondary | ICD-10-CM

## 2023-01-20 NOTE — Progress Notes (Addendum)
Anesthesia Review:  PCP: Plotnikov- LOV 11/19/22  Cardiologist : none  Chest x-ray : EKG : 11/10/22  Echo : 02/14/22  Stress test: Cardiac Cath :  Activity level: can do a flight of stairs without difficutly  Sleep Study/ CPAP : none  Fasting Blood Sugar :      / Checks Blood Sugar -- times a day:   Blood Thinner/ Instructions /Last Dose: ASA / Instructions/ Last Dose :    Weight 347 lbs.  Email to [employer], etc in regards to [place] .  Email on front of chart.    PT to bring in Living Will DOS.

## 2023-01-20 NOTE — Patient Instructions (Addendum)
SURGICAL WAITING ROOM VISITATION  Patients having surgery or a procedure may have no more than 2 support people in the waiting area - these visitors may rotate.    Children under the age of 52 must have an adult with them who is not the patient.  Due to an increase in RSV and influenza rates and associated hospitalizations, children ages 29 and under may not visit patients in [facility] hospitals.  If the patient needs to stay at the hospital during part of their recovery, the visitor guidelines for inpatient rooms apply. Pre-op nurse will coordinate an appropriate time for 1 support person to accompany patient in pre-op.  This support person may not rotate.    Please refer to the [facility] website for the visitor guidelines for Inpatients (after your surgery is over and you are in a regular room).       Your procedure is scheduled on:  02/03/23    Report to [facility] Main Entrance    Report to admitting at  [phone]   Call this number if you have problems the morning of surgery [phone]   Do not eat food :After Midnight.   After Midnight you may have the following liquids until _0430_____ AM DAY OF SURGERY  Water Non-Citrus Juices (without pulp, NO RED-Apple, White grape, White cranberry) Black Coffee (NO MILK/CREAM OR CREAMERS, sugar ok)  Clear Tea (NO MILK/CREAM OR CREAMERS, sugar ok) regular and decaf                             Plain Jell-O (NO RED)                                           Fruit ices (not with fruit pulp, NO RED)                                     Popsicles (NO RED)                                                               Sports drinks like Gatorade (NO RED)                    The day of surgery:  Drink ONE (1) Pre-Surgery Clear Ensure or G2 at 0430 AM ( have completed by )  the morning of surgery. Drink in one sitting. Do not sip.  This drink was given to you during your hospital  pre-op appointment visit. Nothing else to drink  after completing the  Pre-Surgery Clear Ensure or G2.          If you have questions, please contact your surgeon's office.      Oral Hygiene is also important to reduce your risk of infection.                                    Remember - BRUSH YOUR TEETH THE MORNING OF SURGERY WITH YOUR REGULAR TOOTHPASTE  DENTURES WILL BE REMOVED PRIOR TO SURGERY PLEASE DO NOT APPLY "Poly grip" OR ADHESIVES!!!   Do NOT smoke after Midnight   Stop all vitamins and herbal supplements 7 days before surgery.   Take these medicines the morning of surgery with A SIP OF WATER:  inhalers as usual and bring, wellbutrin, protonix   DO NOT TAKE ANY ORAL DIABETIC MEDICATIONS DAY OF YOUR SURGERY  Bring CPAP mask and tubing day of surgery.                              You may not have any metal on your body including hair pins, jewelry, and body piercing             Do not wear make-up, lotions, powders, perfumes/cologne, or deodorant  Do not wear nail polish including gel and S&S, artificial/acrylic nails, or any other type of covering on natural nails including finger and toenails. If you have artificial nails, gel coating, etc. that needs to be removed by a nail salon please have this removed prior to surgery or surgery may need to be canceled/ delayed if the surgeon/ anesthesia feels like they are unable to be safely monitored.   Do not shave  48 hours prior to surgery.               Men may shave face and neck.   Do not bring valuables to the hospital. [place] IS NOT             RESPONSIBLE   FOR VALUABLES.   Contacts, glasses, dentures or bridgework may not be worn into surgery.   Bring small overnight bag day of surgery.   DO NOT BRING YOUR HOME MEDICATIONS TO THE HOSPITAL. PHARMACY WILL DISPENSE MEDICATIONS LISTED ON YOUR MEDICATION LIST TO YOU DURING YOUR ADMISSION IN THE HOSPITAL!    Patients discharged on the day of surgery will not be allowed to drive home.  Someone NEEDS to stay with  you for the first 24 hours after anesthesia.   Special Instructions: Bring a copy of your healthcare power of attorney and living will documents the day of surgery if you haven't scanned them before.              Please read over the following fact sheets you were given: IF YOU HAVE QUESTIONS ABOUT YOUR PRE-OP INSTRUCTIONS PLEASE CALL [phone]   If you received a COVID test during your pre-op visit  it is requested that you wear a mask when out in public, stay away from anyone that may not be feeling well and notify your surgeon if you develop symptoms. If you test positive for Covid or have been in contact with anyone that has tested positive in the last 10 days please notify you surgeon.    [place] - Preparing for Surgery Before surgery, you can play an important role.  Because skin is not sterile, your skin needs to be as free of germs as possible.  You can reduce the number of germs on your skin by washing with CHG (chlorahexidine gluconate) soap before surgery.  CHG is an antiseptic cleaner which kills germs and bonds with the skin to continue killing germs even after washing. Please DO NOT use if you have an allergy to CHG or antibacterial soaps.  If your skin becomes reddened/irritated stop using the CHG and inform your nurse when you arrive at Short Stay. Do not shave (including  legs and underarms) for at least 48 hours prior to the first CHG shower.  You may shave your face/neck. Please follow these instructions carefully:  1.  Shower with CHG Soap the night before surgery and the  morning of Surgery.  2.  If you choose to wash your hair, wash your hair first as usual with your  normal  shampoo.  3.  After you shampoo, rinse your hair and body thoroughly to remove the  shampoo.                           4.  Use CHG as you would any other liquid soap.  You can apply chg directly  to the skin and wash                       Gently with a scrungie or clean washcloth.  5.  Apply the CHG  Soap to your body ONLY FROM THE NECK DOWN.   Do not use on face/ open                           Wound or open sores. Avoid contact with eyes, ears mouth and genitals (private parts).                       Wash face,  Genitals (private parts) with your normal soap.             6.  Wash thoroughly, paying special attention to the area where your surgery  will be performed.  7.  Thoroughly rinse your body with warm water from the neck down.  8.  DO NOT shower/wash with your normal soap after using and rinsing off  the CHG Soap.                9.  Pat yourself dry with a clean towel.            10.  Wear clean pajamas.            11.  Place clean sheets on your bed the night of your first shower and do not  sleep with pets. Day of Surgery : Do not apply any lotions/deodorants the morning of surgery.  Please wear clean clothes to the hospital/surgery center.  FAILURE TO FOLLOW THESE INSTRUCTIONS MAY RESULT IN THE CANCELLATION OF YOUR SURGERY PATIENT SIGNATURE_________________________________  NURSE SIGNATURE__________________________________  ________________________________________________________________________

## 2023-01-24 ENCOUNTER — Other Ambulatory Visit: Payer: Self-pay

## 2023-01-24 ENCOUNTER — Encounter (HOSPITAL_COMMUNITY)
Admission: RE | Admit: 2023-01-24 | Discharge: 2023-01-24 | Disposition: A | Discharge status: Home / Self Care | Payer: BC Managed Care – PPO | Source: Ambulatory Visit | Attending: General Surgery | Admitting: General Surgery

## 2023-01-24 ENCOUNTER — Encounter (HOSPITAL_COMMUNITY): Payer: Self-pay

## 2023-01-24 VITALS — BP 140/82 | HR 91 | Temp 98.7°F | Resp 16 | Ht 67.0 in | Wt 347.0 lb

## 2023-01-24 DIAGNOSIS — I10 Essential (primary) hypertension: Secondary | ICD-10-CM | POA: Insufficient documentation

## 2023-01-24 DIAGNOSIS — Z01812 Encounter for preprocedural laboratory examination: Secondary | ICD-10-CM | POA: Insufficient documentation

## 2023-01-24 DIAGNOSIS — Z01818 Encounter for other preprocedural examination: Secondary | ICD-10-CM

## 2023-01-24 LAB — CBC WITH DIFFERENTIAL/PLATELET
Abs Immature Granulocytes: 0.05 10*3/uL (ref 0.00–0.07)
Basophils Absolute: 0 10*3/uL (ref 0.0–0.1)
Basophils Relative: 0 %
Eosinophils Absolute: 0.1 10*3/uL (ref 0.0–0.5)
Eosinophils Relative: 1 %
HCT: 48.3 % — ABNORMAL HIGH (ref 36.0–46.0)
Hemoglobin: 15.5 g/dL — ABNORMAL HIGH (ref 12.0–15.0)
Immature Granulocytes: 1 %
Lymphocytes Relative: 21 %
Lymphs Abs: 2.2 10*3/uL (ref 0.7–4.0)
MCH: 29.9 pg (ref 26.0–34.0)
MCHC: 32.1 g/dL (ref 30.0–36.0)
MCV: 93.1 fL (ref 80.0–100.0)
Monocytes Absolute: 0.6 10*3/uL (ref 0.1–1.0)
Monocytes Relative: 6 %
Neutro Abs: 7.8 10*3/uL — ABNORMAL HIGH (ref 1.7–7.7)
Neutrophils Relative %: 71 %
Platelets: 341 10*3/uL (ref 150–400)
RBC: 5.19 MIL/uL — ABNORMAL HIGH (ref 3.87–5.11)
RDW: 13.4 % (ref 11.5–15.5)
WBC: 10.8 10*3/uL — ABNORMAL HIGH (ref 4.0–10.5)
nRBC: 0 % (ref 0.0–0.2)

## 2023-01-24 LAB — COMPREHENSIVE METABOLIC PANEL
ALT: 20 U/L (ref 0–44)
AST: 17 U/L (ref 15–41)
Albumin: 3.8 g/dL (ref 3.5–5.0)
Alkaline Phosphatase: 79 U/L (ref 38–126)
Anion gap: 10 (ref 5–15)
BUN: 10 mg/dL (ref 6–20)
CO2: 28 mmol/L (ref 22–32)
Calcium: 9.5 mg/dL (ref 8.9–10.3)
Chloride: 101 mmol/L (ref 98–111)
Creatinine, Ser: 0.51 mg/dL (ref 0.44–1.00)
GFR, Estimated: >60 mL/min (ref >60)
Glucose, Bld: 140 mg/dL — ABNORMAL HIGH (ref 70–99)
Potassium: 3.7 mmol/L (ref 3.5–5.1)
Sodium: 139 mmol/L (ref 135–145)
Total Bilirubin: 0.5 mg/dL (ref 0.0–1.2)
Total Protein: 7.3 g/dL (ref 6.5–8.1)

## 2023-02-03 ENCOUNTER — Ambulatory Visit (HOSPITAL_COMMUNITY)
Admission: RE | Admit: 2023-02-03 | Discharge: 2023-02-03 | Disposition: A | Discharge status: Home / Self Care | Payer: BC Managed Care – PPO | Attending: General Surgery | Admitting: General Surgery

## 2023-02-03 ENCOUNTER — Ambulatory Visit (HOSPITAL_COMMUNITY): Payer: BC Managed Care – PPO | Admitting: Physician Assistant

## 2023-02-03 ENCOUNTER — Ambulatory Visit (HOSPITAL_COMMUNITY): Payer: BC Managed Care – PPO | Admitting: Anesthesiology

## 2023-02-03 ENCOUNTER — Encounter (HOSPITAL_COMMUNITY): Payer: Self-pay | Admitting: General Surgery

## 2023-02-03 ENCOUNTER — Other Ambulatory Visit (HOSPITAL_COMMUNITY): Payer: Self-pay

## 2023-02-03 ENCOUNTER — Encounter (HOSPITAL_COMMUNITY)
Admission: RE | Disposition: A | Discharge status: Home / Self Care | Payer: Self-pay | Source: Home / Self Care | Attending: General Surgery

## 2023-02-03 ENCOUNTER — Other Ambulatory Visit: Payer: Self-pay

## 2023-02-03 DIAGNOSIS — I10 Essential (primary) hypertension: Secondary | ICD-10-CM | POA: Insufficient documentation

## 2023-02-03 DIAGNOSIS — Y838 Other surgical procedures as the cause of abnormal reaction of the patient, or of later complication, without mention of misadventure at the time of the procedure: Secondary | ICD-10-CM | POA: Insufficient documentation

## 2023-02-03 DIAGNOSIS — E6689 Other obesity not elsewhere classified: Secondary | ICD-10-CM | POA: Insufficient documentation

## 2023-02-03 DIAGNOSIS — F17200 Nicotine dependence, unspecified, uncomplicated: Secondary | ICD-10-CM | POA: Diagnosis not present

## 2023-02-03 DIAGNOSIS — K9509 Other complications of gastric band procedure: Secondary | ICD-10-CM | POA: Diagnosis not present

## 2023-02-03 DIAGNOSIS — J45909 Unspecified asthma, uncomplicated: Secondary | ICD-10-CM | POA: Diagnosis not present

## 2023-02-03 DIAGNOSIS — Z6841 Body Mass Index (BMI) 40.0 and over, adult: Secondary | ICD-10-CM | POA: Insufficient documentation

## 2023-02-03 DIAGNOSIS — Z01818 Encounter for other preprocedural examination: Secondary | ICD-10-CM

## 2023-02-03 DIAGNOSIS — R1310 Dysphagia, unspecified: Secondary | ICD-10-CM | POA: Diagnosis present

## 2023-02-03 DIAGNOSIS — R1319 Other dysphagia: Secondary | ICD-10-CM | POA: Diagnosis not present

## 2023-02-03 HISTORY — PX: PORT-A-CATH REMOVAL: SHX5289

## 2023-02-03 LAB — TYPE AND SCREEN
ABO/RH(D): A POS
Antibody Screen: NEGATIVE

## 2023-02-03 SURGERY — REMOVAL, GASTRIC BAND, LAPAROSCOPIC
Anesthesia: General

## 2023-02-03 MED ORDER — SUCCINYLCHOLINE CHLORIDE 200 MG/10ML IV SOSY
PREFILLED_SYRINGE | INTRAVENOUS | Status: AC
  Filled 2023-02-03: qty 10

## 2023-02-03 MED ORDER — LIDOCAINE HCL (CARDIAC) PF 100 MG/5ML IV SOSY
PREFILLED_SYRINGE | INTRAVENOUS | Status: DC | PRN
  Administered 2023-02-03: 100 mg via INTRAVENOUS

## 2023-02-03 MED ORDER — CHLORHEXIDINE GLUCONATE 0.12 % MT SOLN
15.0000 mL | Freq: Once | OROMUCOSAL | Status: AC
  Administered 2023-02-03: 15 mL via OROMUCOSAL

## 2023-02-03 MED ORDER — BUPIVACAINE LIPOSOME 1.3 % IJ SUSP: 20.0000 mL | Freq: Once | INTRAMUSCULAR | Status: DC

## 2023-02-03 MED ORDER — ROCURONIUM BROMIDE 10 MG/ML (PF) SYRINGE
PREFILLED_SYRINGE | INTRAVENOUS | Status: AC
  Filled 2023-02-03: qty 10

## 2023-02-03 MED ORDER — ONDANSETRON HCL 4 MG/2ML IJ SOLN
INTRAMUSCULAR | Status: DC | PRN
  Administered 2023-02-03: 4 mg via INTRAVENOUS

## 2023-02-03 MED ORDER — ONDANSETRON HCL 4 MG/2ML IJ SOLN
INTRAMUSCULAR | Status: AC
  Filled 2023-02-03: qty 2

## 2023-02-03 MED ORDER — ORAL CARE MOUTH RINSE
15.0000 mL | Freq: Once | OROMUCOSAL | Status: AC
Start: 2023-02-03 — End: 2023-02-03

## 2023-02-03 MED ORDER — MIDAZOLAM HCL 2 MG/2ML IJ SOLN
INTRAMUSCULAR | Status: AC
Start: 2023-02-03 — End: ?
  Filled 2023-02-03: qty 2

## 2023-02-03 MED ORDER — FENTANYL CITRATE PF 50 MCG/ML IJ SOSY
25.0000 ug | PREFILLED_SYRINGE | INTRAMUSCULAR | Status: DC | PRN
  Administered 2023-02-03 (×3): 50 ug via INTRAVENOUS

## 2023-02-03 MED ORDER — ONDANSETRON HCL 4 MG/2ML IJ SOLN
4.0000 mg | Freq: Once | INTRAMUSCULAR | Status: DC | PRN
Start: 2023-02-03 — End: 2023-02-03

## 2023-02-03 MED ORDER — PROPOFOL 10 MG/ML IV BOLUS
INTRAVENOUS | Status: AC
  Filled 2023-02-03: qty 20

## 2023-02-03 MED ORDER — DEXAMETHASONE SODIUM PHOSPHATE 4 MG/ML IJ SOLN
4.0000 mg | INTRAMUSCULAR | Status: AC
Start: 2023-02-03 — End: 2023-02-03
  Administered 2023-02-03: 10 mg via INTRAVENOUS

## 2023-02-03 MED ORDER — SODIUM CHLORIDE (PF) 0.9 % IJ SOLN
INTRAMUSCULAR | Status: AC
  Filled 2023-02-03: qty 10

## 2023-02-03 MED ORDER — ACETAMINOPHEN 10 MG/ML IV SOLN: 1000.0000 mg | Freq: Once | INTRAVENOUS | Status: DC | PRN

## 2023-02-03 MED ORDER — OXYCODONE HCL 5 MG PO TABS
5.0000 mg | ORAL_TABLET | Freq: Once | ORAL | Status: DC | PRN
Start: 2023-02-03 — End: 2023-02-03

## 2023-02-03 MED ORDER — BUPIVACAINE-EPINEPHRINE 0.25% -1:200000 IJ SOLN
INTRAMUSCULAR | Status: DC | PRN
  Administered 2023-02-03: 60 mL

## 2023-02-03 MED ORDER — ALBUTEROL SULFATE HFA 108 (90 BASE) MCG/ACT IN AERS
INHALATION_SPRAY | RESPIRATORY_TRACT | Status: DC | PRN
  Administered 2023-02-03 (×3): 2 via RESPIRATORY_TRACT

## 2023-02-03 MED ORDER — OXYCODONE HCL 5 MG PO TABS
5.0000 mg | ORAL_TABLET | Freq: Four times a day (QID) | ORAL | 0 refills | Status: DC | PRN
  Filled 2023-02-03: qty 10, 3d supply, fill #0

## 2023-02-03 MED ORDER — FENTANYL CITRATE PF 50 MCG/ML IJ SOSY
PREFILLED_SYRINGE | INTRAMUSCULAR | Status: AC
  Filled 2023-02-03: qty 1

## 2023-02-03 MED ORDER — FENTANYL CITRATE (PF) 100 MCG/2ML IJ SOLN
INTRAMUSCULAR | Status: DC | PRN
  Administered 2023-02-03: 50 ug via INTRAVENOUS
  Administered 2023-02-03: 100 ug via INTRAVENOUS

## 2023-02-03 MED ORDER — SCOPOLAMINE 1 MG/3DAYS TD PT72
1.0000 | MEDICATED_PATCH | TRANSDERMAL | Status: DC
  Administered 2023-02-03: 1.5 mg via TRANSDERMAL
  Filled 2023-02-03: qty 1

## 2023-02-03 MED ORDER — OXYCODONE HCL 5 MG/5ML PO SOLN: 5.0000 mg | Freq: Once | ORAL | Status: DC | PRN

## 2023-02-03 MED ORDER — SUCCINYLCHOLINE CHLORIDE 200 MG/10ML IV SOSY
PREFILLED_SYRINGE | INTRAVENOUS | Status: DC | PRN
  Administered 2023-02-03: 100 mg via INTRAVENOUS

## 2023-02-03 MED ORDER — BUPIVACAINE-EPINEPHRINE 0.25% -1:200000 IJ SOLN
INTRAMUSCULAR | Status: AC
  Filled 2023-02-03: qty 1

## 2023-02-03 MED ORDER — CEFOTETAN DISODIUM 2 G IJ SOLR
2.0000 g | INTRAMUSCULAR | Status: AC
  Administered 2023-02-03: 2 g via INTRAVENOUS
  Filled 2023-02-03: qty 2

## 2023-02-03 MED ORDER — ROCURONIUM BROMIDE 100 MG/10ML IV SOLN
INTRAVENOUS | Status: DC | PRN
  Administered 2023-02-03: 20 mg via INTRAVENOUS
  Administered 2023-02-03: 40 mg via INTRAVENOUS

## 2023-02-03 MED ORDER — FENTANYL CITRATE (PF) 100 MCG/2ML IJ SOLN
INTRAMUSCULAR | Status: AC
  Filled 2023-02-03: qty 2

## 2023-02-03 MED ORDER — CHLORHEXIDINE GLUCONATE CLOTH 2 % EX PADS: 6.0000 | MEDICATED_PAD | Freq: Once | CUTANEOUS | Status: DC

## 2023-02-03 MED ORDER — LACTATED RINGERS IV SOLN
INTRAVENOUS | Status: DC
Start: 2023-02-03 — End: 2023-02-03

## 2023-02-03 MED ORDER — HYDROMORPHONE HCL 1 MG/ML IJ SOLN
0.5000 mg | INTRAMUSCULAR | Status: DC | PRN
  Administered 2023-02-03 (×2): 0.5 mg via INTRAVENOUS

## 2023-02-03 MED ORDER — HYDROMORPHONE HCL 1 MG/ML IJ SOLN
INTRAMUSCULAR | Status: AC
  Filled 2023-02-03: qty 1

## 2023-02-03 MED ORDER — ACETAMINOPHEN 500 MG PO TABS
1000.0000 mg | ORAL_TABLET | ORAL | Status: AC
  Administered 2023-02-03: 1000 mg via ORAL
  Filled 2023-02-03: qty 2

## 2023-02-03 MED ORDER — HEPARIN SODIUM (PORCINE) 5000 UNIT/ML IJ SOLN
5000.0000 [IU] | INTRAMUSCULAR | Status: AC
  Administered 2023-02-03: 5000 [IU] via SUBCUTANEOUS
  Filled 2023-02-03: qty 1

## 2023-02-03 MED ORDER — MIDAZOLAM HCL 5 MG/5ML IJ SOLN
INTRAMUSCULAR | Status: DC | PRN
  Administered 2023-02-03: 2 mg via INTRAVENOUS

## 2023-02-03 MED ORDER — ALBUTEROL SULFATE HFA 108 (90 BASE) MCG/ACT IN AERS
INHALATION_SPRAY | RESPIRATORY_TRACT | Status: AC
  Filled 2023-02-03: qty 6.7

## 2023-02-03 MED ORDER — 0.9 % SODIUM CHLORIDE (POUR BTL) OPTIME
TOPICAL | Status: DC | PRN
  Administered 2023-02-03: 1000 mL

## 2023-02-03 MED ORDER — FENTANYL CITRATE PF 50 MCG/ML IJ SOSY
PREFILLED_SYRINGE | INTRAMUSCULAR | Status: AC
  Filled 2023-02-03: qty 2

## 2023-02-03 MED ORDER — DEXAMETHASONE SODIUM PHOSPHATE 10 MG/ML IJ SOLN
INTRAMUSCULAR | Status: AC
  Filled 2023-02-03: qty 1

## 2023-02-03 MED ORDER — SUGAMMADEX SODIUM 200 MG/2ML IV SOLN
INTRAVENOUS | Status: DC | PRN
  Administered 2023-02-03: 200 mg via INTRAVENOUS
  Administered 2023-02-03: 400 mg via INTRAVENOUS

## 2023-02-03 MED ORDER — PROPOFOL 10 MG/ML IV BOLUS
INTRAVENOUS | Status: DC | PRN
  Administered 2023-02-03: 200 mg via INTRAVENOUS
  Administered 2023-02-03: 80 ug/kg/min via INTRAVENOUS

## 2023-02-03 MED ORDER — APREPITANT 40 MG PO CAPS
40.0000 mg | ORAL_CAPSULE | ORAL | Status: AC
  Administered 2023-02-03: 40 mg via ORAL
  Filled 2023-02-03: qty 1

## 2023-02-03 SURGICAL SUPPLY — 44 items
ANTIFOG SOL W/FOAM PAD STRL (MISCELLANEOUS) ×1
BAG COUNTER SPONGE SURGICOUNT (BAG) IMPLANT
BENZOIN TINCTURE PRP APPL 2/3 (GAUZE/BANDAGES/DRESSINGS) IMPLANT
BLADE SURG 15 STRL LF DISP TIS (BLADE) ×1 IMPLANT
BLADE SURG SZ11 CARB STEEL (BLADE) ×1 IMPLANT
BNDG ADH 1X3 SHEER STRL LF (GAUZE/BANDAGES/DRESSINGS) ×6 IMPLANT
CHLORAPREP W/TINT 26 (MISCELLANEOUS) ×1 IMPLANT
DERMABOND ADVANCED .7 DNX12 (GAUZE/BANDAGES/DRESSINGS) IMPLANT
DEVICE SUTURE ENDOST 10MM (ENDOMECHANICALS) IMPLANT
DISSECTOR BLUNT TIP ENDO 5MM (MISCELLANEOUS) IMPLANT
DRAPE UTILITY XL STRL (DRAPES) ×1 IMPLANT
ELECT REM PT RETURN 15FT ADLT (MISCELLANEOUS) ×1 IMPLANT
GLOVE BIO SURGEON STRL SZ7.5 (GLOVE) ×1 IMPLANT
GLOVE INDICATOR 8.0 STRL GRN (GLOVE) ×1 IMPLANT
GOWN STRL REUS W/ TWL XL LVL3 (GOWN DISPOSABLE) ×2 IMPLANT
GRASPER SUT TROCAR 14GX15 (MISCELLANEOUS) IMPLANT
IRRIG SUCT STRYKERFLOW 2 WTIP (MISCELLANEOUS)
IRRIGATION SUCT STRKRFLW 2 WTP (MISCELLANEOUS) IMPLANT
KIT BASIN OR (CUSTOM PROCEDURE TRAY) ×1 IMPLANT
KIT TURNOVER KIT A (KITS) IMPLANT
MAT PREVALON FULL STRYKER (MISCELLANEOUS) ×1 IMPLANT
NDL SPNL 22GX3.5 QUINCKE BK (NEEDLE) ×1 IMPLANT
NEEDLE SPNL 22GX3.5 QUINCKE BK (NEEDLE) ×1
NS IRRIG 1000ML POUR BTL (IV SOLUTION) ×1 IMPLANT
PACK UNIVERSAL I (CUSTOM PROCEDURE TRAY) ×1 IMPLANT
PENCIL SMOKE EVACUATOR (MISCELLANEOUS) IMPLANT
SET TUBE SMOKE EVAC HIGH FLOW (TUBING) ×1 IMPLANT
SHEARS HARMONIC 36 ACE (MISCELLANEOUS) IMPLANT
SLEEVE Z-THREAD 5X100MM (TROCAR) ×3 IMPLANT
SOLUTION ANTFG W/FOAM PAD STRL (MISCELLANEOUS) ×1 IMPLANT
SPIKE FLUID TRANSFER (MISCELLANEOUS) ×1 IMPLANT
STRIP CLOSURE SKIN 1/2X4 (GAUZE/BANDAGES/DRESSINGS) IMPLANT
SUT MNCRL AB 4-0 PS2 18 (SUTURE) ×1 IMPLANT
SUT SILK 0 30XBRD TIE 6 (SUTURE) ×1 IMPLANT
SUT VIC AB 2-0 SH 27X BRD (SUTURE) ×1 IMPLANT
SUT VIC AB 3-0 SH 27XBRD (SUTURE) IMPLANT
SUT VICRYL 0 TIES 12 18 (SUTURE) IMPLANT
SUT VICRYL 0 UR6 27IN ABS (SUTURE) IMPLANT
SYR 20ML LL LF (SYRINGE) ×1 IMPLANT
SYS KII OPTICAL ACCESS 15MM (TROCAR)
SYSTEM KII OPTICAL ACCESS 15MM (TROCAR) IMPLANT
TOWEL OR 17X26 10 PK STRL BLUE (TOWEL DISPOSABLE) ×1 IMPLANT
TROCAR BALLN 12MMX100 BLUNT (TROCAR) IMPLANT
TROCAR Z-THREAD OPTICAL 5X100M (TROCAR) ×1 IMPLANT

## 2023-02-03 NOTE — Anesthesia Preprocedure Evaluation (Addendum)
Anesthesia Evaluation  Patient identified by MRN, date of birth, ID band Patient awake    Reviewed: Allergy & Precautions, NPO status , Patient's Chart, lab work & pertinent test results, reviewed documented beta blocker date and time   History of Anesthesia Complications (+) history of anesthetic complications  Airway Mallampati: II  TM Distance: >3 FB Neck ROM: Limited    Dental no notable dental hx.    Pulmonary neg shortness of breath, asthma , neg COPD, Current Smoker and Patient abstained from smoking.   breath sounds clear to auscultation       Cardiovascular hypertension, (-) CAD, (-) Past MI and (-) Cardiac Stents  Rhythm:Regular Rate:Normal  Normal TTE 2024   Neuro/Psych neg Seizures PSYCHIATRIC DISORDERS Anxiety Depression       GI/Hepatic ,,,(+) neg Cirrhosis        Endo/Other    Class 4 obesity  Renal/GU Renal disease     Musculoskeletal  (+) Arthritis ,    Abdominal   Peds  Hematology   Anesthesia Other Findings   Reproductive/Obstetrics                             Anesthesia Physical Anesthesia Plan  ASA: 3  Anesthesia Plan: General   Post-op Pain Management:    Induction: Intravenous  PONV Risk Score and Plan: 2 and Ondansetron and Dexamethasone  Airway Management Planned: Oral ETT and Video Laryngoscope Planned  Additional Equipment:   Intra-op Plan:   Post-operative Plan: Extubation in OR  Informed Consent: I have reviewed the patients History and Physical, chart, labs and discussed the procedure including the risks, benefits and alternatives for the proposed anesthesia with the patient or authorized representative who has indicated his/her understanding and acceptance.     Dental advisory given  Plan Discussed with: CRNA  Anesthesia Plan Comments:        Anesthesia Quick Evaluation

## 2023-02-03 NOTE — Anesthesia Postprocedure Evaluation (Signed)
Anesthesia Post Note  Patient: Stephanie Harper  Procedure(s) Performed: LAPAROSCOPIC REMOVAL OF GASTRIC BAND SUBCUTANEOUS REMOVAL PORT-A-CATH     Patient location during evaluation: PACU Anesthesia Type: General Level of consciousness: awake and alert Pain management: pain level controlled Vital Signs Assessment: post-procedure vital signs reviewed and stable Respiratory status: spontaneous breathing, nonlabored ventilation, respiratory function stable and patient connected to nasal cannula oxygen Cardiovascular status: blood pressure returned to baseline and stable Postop Assessment: no apparent nausea or vomiting Anesthetic complications: no   No notable events documented.  Last Vitals:  Vitals:   02/03/23 0958 02/03/23 1015  BP: 106/66 120/76  Pulse: 68 75  Resp: 14 16  Temp: 36.8 C 36.8 C  SpO2: 95% 95%    Last Pain:  Vitals:   02/03/23 1015  TempSrc:   PainSc: 4                  Mariann Barter

## 2023-02-03 NOTE — Op Note (Signed)
Preop Diagnosis: band complication of dysphagia  Postop Diagnosis: same  Procedure performed: laparoscopic gastric band removal, subcutaneous port removal  Assitant: Gaynelle Adu  Indications:  The patient with history of gastric adjustable band insertion for weight loss presents with worsening dysphagia after band placement.  Description of Operation:  Following informed consent, the patient was taken to the operating room and placed on the operating table in the supine position.  She had previously received prophylactic antibiotics.  After induction of general endotracheal anesthesia by the anesthesiologist, the patient underwent placement of sequential compression devices.  A timeout was confirmed by the surgery and anesthesia teams.  The patient was adequately padded at all pressure points and placed on a footboard to prevent slippage from the OR table during extremes of position during surgery.  She underwent a routine sterile prep and drape of her entire abdomen.    Next, A transverse incision was made under the left subcostal area and a 5mm optical viewing trocar was introduced into the peritoneal cavity. Pneumoperitoneum was applied with a high flow and low pressure. A laparoscope was inserted to confirm placement. There was some scar to the tubing entrance. This was taken down with cautery. A extraperitoneal block was then placed at the lateral abdominal wall using marcaine. 5 additional incisions were placed: 1 5mm trocar to the left of the midline, 1 12 mm trocar in the right mid abdomen, 1 5mm trocar in the right subcostal area, and a Nathanson retractor was placed through a subxiphoid incision.   The buckle of the band was identified. Cautery was used to free adhesions from over the buckle and the buckle was unclipped and cut free and removed. Next, the band was slipped from around the stomach. The adhesions and cicatrix around the stomach were dissected free to allow distension of the  abdomen.   The band and tubing were removed via the 12 mm trocar. The Nathanson retractor was removed.  Next, attention was turned to the subcutaneous port. The incision over the port was enlarged and cautery was used to remove the port away from surrounding tissues. The subcutaneous spaced was closed with 3-0 vicryl in interrupted fashion. All trocars were removed after pneumoperitoneum was evacuated. All skin incisions were closed with 4-0 monocryl by subcuticular technique.  Estimated blood loss: <72ml  Specimens:  Band for gross only  Local Anesthesia: 60 ml Marcaine  Post-Op Plan:       Pain Management: PO, prn      Antibiotics: Prophylactic      Anticoagulation: Prophylactic, Starting now      Post Op Studies/Consults: Not applicable      Intended Discharge: within 48h      Intended Outpatient Follow-Up: Two Week      Intended Outpatient Studies: Not Applicable      Other: Not Applicable  [place] Stephanie Harper

## 2023-02-03 NOTE — OR Nursing (Signed)
Patient refused pregnancy test. States has not had period since 2006 and did not need test for previous surgeries here at [facility].  Will notify anesthesia upon arrival.

## 2023-02-03 NOTE — Anesthesia Procedure Notes (Signed)
Procedure Name: Intubation Date/Time: 02/03/2023 7:40 AM  Performed by: Randa Evens, CRNAPre-anesthesia Checklist: Patient identified, Emergency Drugs available, Suction available and Patient being monitored Patient Re-evaluated:Patient Re-evaluated prior to induction Oxygen Delivery Method: Circle System Utilized Preoxygenation: Pre-oxygenation with 100% oxygen Induction Type: IV induction Ventilation: Mask ventilation without difficulty Laryngoscope Size: Glidescope and 3 Grade View: Grade I Tube type: Oral Tube size: 7.0 mm Number of attempts: 1 Airway Equipment and Method: Stylet and Oral airway Placement Confirmation: ETT inserted through vocal cords under direct vision, positive ETCO2 and breath sounds checked- equal and bilateral Secured at: 21 cm Tube secured with: Tape Dental Injury: Teeth and Oropharynx as per pre-operative assessment

## 2023-02-03 NOTE — H&P (Signed)
Chief Complaint: FOLLOW UP (RETURN WEIGHT LOSS)  History of Present Illness: Stephanie Harper is a 53 y.o. female who is seen today for lap band issues.  She had the band placed by Dr. Daphine Deutscher in 2014. She had multiple fills over the next 4 years. She had several issues with tolerating food in 2018 and had all of her fluid removed. She has not had any adjustments since. This year she has started to have worsening dysphagia with solid foods. Recently she has been able to tolerate noodles, beef, bananas, and eggs. Within the last month she had trouble getting bananas down. She cannot predict what things will not agree with the band. She has lost 8 pounds since August.  Review of Systems: A complete review of systems was obtained from the patient. I have reviewed this information and discussed as appropriate with the patient. See HPI as well for other ROS.  Review of Systems  Constitutional: Negative.  HENT: Negative.  Eyes: Negative.  Respiratory: Negative.  Cardiovascular: Negative.  Gastrointestinal: Positive for heartburn.  Genitourinary: Negative.  Musculoskeletal: Negative.  Skin: Negative.  Neurological: Negative.  Endo/Heme/Allergies: Negative.  Psychiatric/Behavioral: Negative.    Medical History: History reviewed. No pertinent past medical history.  Patient Active Problem List  Diagnosis  Anal or rectal pain  Arthralgia  Asthma (HHS-HCC)  Chronic anxiety  Contact dermatitis  Depression  History of laparoscopic adjustable gastric banding  Obesity, morbid (CMS/HHS-HCC)  Rash and nonspecific skin eruption  Shoulder pain  Tobacco use disorder  Acute upper respiratory infections of unspecified site  Venous insufficiency of leg  Vitamin D deficiency  Necrobiosis lipoidica   Past Surgical History:  Procedure Laterality Date  LAPBAND  RT SHOULDER SURGERY  STENT REMOVAL-KIDNEY    Allergies  Allergen Reactions  Adhesive Tape-Silicones Other (See Comments)   blisters   Current Outpatient Medications on File Prior to Visit  Medication Sig Dispense Refill  acyclovir (ZOVIRAX) 400 MG tablet Take by mouth.  albuterol (VENTOLIN HFA) 90 mcg/actuation inhaler INHALE 2 PUFFS EVERY 6 HOURS AS NEEDED  ALPRAZolam (XANAX) 1 MG tablet TAKE 1 TABLET BY MOUTH 3 TIMES A DAY AS NEEDED FOR ANXIETY OR SLEEP  biotin 5 mg capsule Take by mouth.  buPROPion (WELLBUTRIN SR) 100 MG SR tablet TAKE 1 TABLET TWICE A DAY  cholecalciferol (VITAMIN D3) 1,000 unit tablet Take by mouth.  clobetasol (TEMOVATE) 0.05 % ointment Apply 2x daily x 4 weeks then stop 45 g 3  cyanocobalamin (VITAMIN B12) 1000 MCG tablet Take by mouth.  fluticasone-vilanterol (BREO ELLIPTA) 100-25 mcg/dose DsDv inhaler Inhale into the lungs.  FUROsemide (LASIX) 40 MG tablet Take by mouth.  HYDROcodone-acetaminophen (NORCO) 5-325 mg tablet Take by mouth.  ibuprofen (ADVIL,MOTRIN) 600 MG tablet TAKE 1 TABLET (600 MG TOTAL) BY MOUTH 2 (TWO) TIMES DAILY AS NEEDED.  multivitamin (MULTIVITAMIN) tablet Take by mouth.  PARoxetine (PAXIL) 10 MG tablet TAKE 1 TABLET EVERY DAY  pentoxifylline (TRENTAL) 400 mg CR tablet Take by mouth.  triamcinolone 0.1 % ointment Apply 2 times a day to affected areas. Stop when smooth. 454 g 3   No current facility-administered medications on file prior to visit.   Family History  Problem Relation Age of Onset  Diabetes Mother  High blood pressure (Hypertension) Father  Skin cancer Father  Coronary Artery Disease (Blocked arteries around heart) Father  Diabetes Father  Obesity Sister    Social History   Tobacco Use  Smoking Status Every Day  Smokeless Tobacco Never    Social  History   Socioeconomic History  Marital status: Married  Tobacco Use  Smoking status: Every Day  Smokeless tobacco: Never  Vaping Use  Vaping status: Unknown  Substance and Sexual Activity  Alcohol use: Not Currently  Drug use: Never   Social Drivers of Health   Financial  Resource Strain: Low Risk (11/18/2022)  Received from [facility]  Overall Financial Resource Strain (CARDIA)  Difficulty of Paying Living Expenses: Not hard at all  Food Insecurity: No Food Insecurity (11/18/2022)  Received from [facility]  Hunger Vital Sign  Worried About Running Out of Food in the Last Year: Never true  Ran Out of Food in the Last Year: Never true  Transportation Needs: No Transportation Needs (11/18/2022)  Received from [facility] - Transportation  Lack of Transportation (Medical): No  Lack of Transportation (Non-Medical): No  Physical Activity: Inactive (11/18/2022)  Received from [facility]  Exercise Vital Sign  Days of Exercise per Week: 0 days  Minutes of Exercise per Session: 20 min  Stress: No Stress Concern Present (11/18/2022)  Received from [facility] - Occupational Stress Questionnaire  Feeling of Stress : Only a little  Social Connections: Moderately Integrated (11/18/2022)  Received from [facility]  Social Connection and Isolation Panel [NHANES]  Frequency of Communication with Friends and Family: More than three times a week  Frequency of Social Gatherings with Friends and Family: Twice a week  Attends Religious Services: Never  Database administrator or Organizations: No  Attends Engineer, structural: More than 4 times per year  Marital Status: Married   Objective:   Vitals:  11/21/22 1518  BP: 116/81  Pulse: 96  Temp: 36.6 C (97.9 F)  SpO2: 98%  Weight: (!) 162.7 kg (358 lb 9.6 oz)  Height: 168.9 cm (5' 6.5")  PainSc: 0-No pain   Body mass index is 57.01 kg/m.  Physical Exam Constitutional:  Appearance: Normal appearance.  HENT:  Head: Normocephalic and atraumatic.  Pulmonary:  Effort: Pulmonary effort is normal.  Abdominal:  Comments: Nontender, palpable subcutaneous port in the right upper abdomen  Musculoskeletal:  General: Normal range of motion.   Cervical back: Normal range of motion.  Neurological:  General: No focal deficit present.  Mental Status: She is alert and oriented to person, place, and time. Mental status is at baseline.  Psychiatric:  Mood and Affect: Mood normal.  Behavior: Behavior normal.  Thought Content: Thought content normal.   Assessment and Plan:   Diagnoses and all orders for this visit:  History of laparoscopic adjustable gastric banding  Esophageal dysphagia  Tobacco use disorder  She has a band that currently has no fluid in it and is still having nausea and vomiting or regurgitation of food particles. We discussed options and agreed to remove the band. Details of the procedure to the return under general anesthesia laparoscopically, and the scar tissue around the band would be removed so that the band could be unbuckled and removed from her abdomen and we would remove the port at the same time. He should good understanding when to proceed with band removal as outpatient

## 2023-02-03 NOTE — Transfer of Care (Signed)
Immediate Anesthesia Transfer of Care Note  Patient: Stephanie Harper  Procedure(s) Performed: LAPAROSCOPIC REMOVAL OF GASTRIC BAND SUBCUTANEOUS REMOVAL PORT-A-CATH  Patient Location: PACU  Anesthesia Type:General  Level of Consciousness: drowsy  Airway & Oxygen Therapy: Patient Spontanous Breathing and Patient connected to face mask oxygen  Post-op Assessment: Report given to RN  Post vital signs: Reviewed and stable  Last Vitals:  Vitals Value Taken Time  BP 157/64 02/03/23 0838  Temp 36 C 02/03/23 0838  Pulse 87 02/03/23 0841  Resp 16 02/03/23 0841  SpO2 96 % 02/03/23 0841  Vitals shown include unfiled device data.  Last Pain:  Vitals:   02/03/23 0838  TempSrc:   PainSc: Asleep      Patients Stated Pain Goal: 6 (02/03/23 0551)  Complications: No notable events documented.

## 2023-02-04 ENCOUNTER — Encounter (HOSPITAL_COMMUNITY): Payer: Self-pay | Admitting: General Surgery

## 2023-02-18 ENCOUNTER — Encounter: Payer: Self-pay | Admitting: Internal Medicine

## 2023-02-18 ENCOUNTER — Telehealth (INDEPENDENT_AMBULATORY_CARE_PROVIDER_SITE_OTHER): Payer: BC Managed Care – PPO | Admitting: Internal Medicine

## 2023-02-18 ENCOUNTER — Ambulatory Visit: Payer: BC Managed Care – PPO | Admitting: Internal Medicine

## 2023-02-18 DIAGNOSIS — Z9884 Bariatric surgery status: Secondary | ICD-10-CM

## 2023-02-18 DIAGNOSIS — G8929 Other chronic pain: Secondary | ICD-10-CM | POA: Diagnosis not present

## 2023-02-18 DIAGNOSIS — F419 Anxiety disorder, unspecified: Secondary | ICD-10-CM | POA: Diagnosis not present

## 2023-02-18 DIAGNOSIS — M5440 Lumbago with sciatica, unspecified side: Secondary | ICD-10-CM

## 2023-02-18 MED ORDER — HYDROCODONE-ACETAMINOPHEN 10-325 MG PO TABS: 0.5000 | ORAL_TABLET | Freq: Four times a day (QID) | ORAL | 0 refills | Status: DC | PRN

## 2023-02-18 MED ORDER — ALPRAZOLAM 1 MG PO TABS: ORAL_TABLET | ORAL | 3 refills | Status: DC

## 2023-02-18 NOTE — Assessment & Plan Note (Addendum)
Start [phone]  if ok w/Surgery - Dr Sheliah Hatch

## 2023-02-18 NOTE — Assessment & Plan Note (Addendum)
Undo 01/2023 surgery - Dr Sheliah Hatch

## 2023-02-18 NOTE — Assessment & Plan Note (Signed)
Chronic  Cont on Xanax prn Potential benefits of a long term benzodiazepines  use as well as potential risks  and complications were explained to the patient and were aknowledged. Situational. Continue with Wellbutrin and Paxil.  Psychology consultation - Seeing Dr Bosie Clos for counseling. Better

## 2023-02-18 NOTE — Progress Notes (Signed)
Virtual Visit via Video Note  I connected with Stephanie Harper on 02/18/23 at  3:00 PM EST by a video enabled telemedicine application and verified that I am speaking with the correct person using two identifiers.   I discussed the limitations of evaluation and management by telemedicine and the availability of in person appointments. The patient expressed understanding and agreed to proceed.  I was located at our [facility] office. The patient was at home. There was no one else present in the visit.  Chief Complaint  Patient presents with   Medical Management of Chronic Issues    3 mnth f/u     History of Present Illness:  F/u on lap-band removal - feeling better; anxiety,   ROS   Observations/Objective: The patient appears to be in no acute distress  Assessment and Plan:  Problem List Items Addressed This Visit     OBESITY, MORBID   Start [phone]  if ok w/Surgery - Dr Sheliah Hatch      Chronic anxiety - Primary   Chronic  Cont on Xanax prn Potential benefits of a long term benzodiazepines  use as well as potential risks  and complications were explained to the patient and were aknowledged. Situational. Continue with Wellbutrin and Paxil.  Psychology consultation - Seeing Dr Bosie Clos for counseling. Better      Relevant Medications   ALPRAZolam (XANAX) 1 MG tablet   LOW BACK PAIN   Chronic Cont on Norco prn - 10/325 0.5 tab QID  Potential benefits of a long term opioids use as well as potential risks (i.e. addiction risk, apnea etc) and complications (i.e. Somnolence, constipation and others) were explained to the patient and were aknowledged. Blue-Emu cream was recommended to use 2-3 times a day      Relevant Medications   HYDROcodone-acetaminophen (NORCO) 10-325 MG tablet   HYDROcodone-acetaminophen (NORCO) 10-325 MG tablet   HYDROcodone-acetaminophen (NORCO) 10-325 MG tablet   Lapband APS Jan 2014   Undo 01/2023 surgery - Dr Sheliah Hatch        Meds  ordered this encounter  Medications   ALPRAZolam Prudy Feeler) 1 MG tablet    Sig: May take 1 tablet (1 mg total) by mouth 3 (three) times daily as needed. May also take 1 tablet (1 mg total) 3 (three) times daily as needed.    Dispense:  90 tablet    Refill:  3   HYDROcodone-acetaminophen (NORCO) 10-325 MG tablet    Sig: Take 0.5 tablets by mouth every 6 (six) hours as needed.    Dispense:  60 tablet    Refill:  0    Please fill on or after 02/22/23   HYDROcodone-acetaminophen (NORCO) 10-325 MG tablet    Sig: Take 0.5 tablets by mouth every 6 (six) hours as needed for severe pain (pain score 7-10).    Dispense:  60 tablet    Refill:  0    Please fill on or after 03/24/23   HYDROcodone-acetaminophen (NORCO) 10-325 MG tablet    Sig: Take 0.5 tablets by mouth every 6 (six) hours as needed.    Dispense:  60 tablet    Refill:  0    Please fill on or after 04/23/23     Follow Up Instructions:    I discussed the assessment and treatment plan with the patient. The patient was provided an opportunity to ask questions and all were answered. The patient agreed with the plan and demonstrated an understanding of the instructions.   The patient was advised  to call back or seek an in-person evaluation if the symptoms worsen or if the condition fails to improve as anticipated.  I provided face-to-face time during this encounter. We were at different locations.   Sonda Primes, MD

## 2023-02-18 NOTE — Assessment & Plan Note (Signed)
Chronic Cont on Norco prn - 10/325 0.5 tab QID  Potential benefits of a long term opioids use as well as potential risks (i.e. addiction risk, apnea etc) and complications (i.e. Somnolence, constipation and others) were explained to the patient and were aknowledged. Blue-Emu cream was recommended to use 2-3 times a day

## 2023-02-19 ENCOUNTER — Ambulatory Visit: Payer: BC Managed Care – PPO | Admitting: Internal Medicine

## 2023-03-13 ENCOUNTER — Encounter (HOSPITAL_COMMUNITY): Payer: Self-pay

## 2023-03-13 ENCOUNTER — Emergency Department (HOSPITAL_COMMUNITY)

## 2023-03-13 ENCOUNTER — Emergency Department (HOSPITAL_COMMUNITY)
Admission: EM | Admit: 2023-03-13 | Discharge: 2023-03-13 | Disposition: A | Discharge status: Home / Self Care | Attending: Emergency Medicine | Admitting: Emergency Medicine

## 2023-03-13 ENCOUNTER — Ambulatory Visit: Payer: Self-pay | Admitting: Internal Medicine

## 2023-03-13 ENCOUNTER — Other Ambulatory Visit: Payer: Self-pay

## 2023-03-13 DIAGNOSIS — Z79899 Other long term (current) drug therapy: Secondary | ICD-10-CM | POA: Diagnosis not present

## 2023-03-13 DIAGNOSIS — I10 Essential (primary) hypertension: Secondary | ICD-10-CM | POA: Insufficient documentation

## 2023-03-13 DIAGNOSIS — R059 Cough, unspecified: Secondary | ICD-10-CM | POA: Diagnosis present

## 2023-03-13 DIAGNOSIS — J111 Influenza due to unidentified influenza virus with other respiratory manifestations: Secondary | ICD-10-CM

## 2023-03-13 DIAGNOSIS — R0602 Shortness of breath: Secondary | ICD-10-CM

## 2023-03-13 DIAGNOSIS — J101 Influenza due to other identified influenza virus with other respiratory manifestations: Secondary | ICD-10-CM | POA: Insufficient documentation

## 2023-03-13 LAB — CBC WITH DIFFERENTIAL/PLATELET
Abs Immature Granulocytes: 0.01 10*3/uL (ref 0.00–0.07)
Basophils Absolute: 0 10*3/uL (ref 0.0–0.1)
Basophils Relative: 0 %
Eosinophils Absolute: 0.1 10*3/uL (ref 0.0–0.5)
Eosinophils Relative: 1 %
HCT: 47 % — ABNORMAL HIGH (ref 36.0–46.0)
Hemoglobin: 15.6 g/dL — ABNORMAL HIGH (ref 12.0–15.0)
Immature Granulocytes: 0 %
Lymphocytes Relative: 29 %
Lymphs Abs: 2.5 10*3/uL (ref 0.7–4.0)
MCH: 29.8 pg (ref 26.0–34.0)
MCHC: 33.2 g/dL (ref 30.0–36.0)
MCV: 89.9 fL (ref 80.0–100.0)
Monocytes Absolute: 1 10*3/uL (ref 0.1–1.0)
Monocytes Relative: 12 %
Neutro Abs: 4.9 10*3/uL (ref 1.7–7.7)
Neutrophils Relative %: 58 %
Platelets: 208 10*3/uL (ref 150–400)
RBC: 5.23 MIL/uL — ABNORMAL HIGH (ref 3.87–5.11)
RDW: 13.3 % (ref 11.5–15.5)
WBC: 8.6 10*3/uL (ref 4.0–10.5)
nRBC: 0 % (ref 0.0–0.2)

## 2023-03-13 LAB — BASIC METABOLIC PANEL
Anion gap: 13 (ref 5–15)
BUN: 11 mg/dL (ref 6–20)
CO2: 24 mmol/L (ref 22–32)
Calcium: 9 mg/dL (ref 8.9–10.3)
Chloride: 99 mmol/L (ref 98–111)
Creatinine, Ser: 0.53 mg/dL (ref 0.44–1.00)
GFR, Estimated: >60 mL/min (ref >60)
Glucose, Bld: 141 mg/dL — ABNORMAL HIGH (ref 70–99)
Potassium: 3.4 mmol/L — ABNORMAL LOW (ref 3.5–5.1)
Sodium: 136 mmol/L (ref 135–145)

## 2023-03-13 LAB — RESP PANEL BY RT-PCR (RSV, FLU A&B, COVID)  RVPGX2
Influenza A by PCR: POSITIVE — AB
Influenza B by PCR: NEGATIVE
Resp Syncytial Virus by PCR: NEGATIVE
SARS Coronavirus 2 by RT PCR: NEGATIVE

## 2023-03-13 LAB — D-DIMER, QUANTITATIVE: D-Dimer, Quant: 0.87 ug{FEU}/mL — ABNORMAL HIGH (ref 0.00–0.50)

## 2023-03-13 MED ORDER — IPRATROPIUM-ALBUTEROL 0.5-2.5 (3) MG/3ML IN SOLN
3.0000 mL | Freq: Once | RESPIRATORY_TRACT | Status: AC
  Administered 2023-03-13: 3 mL via RESPIRATORY_TRACT
  Filled 2023-03-13: qty 3

## 2023-03-13 MED ORDER — DEXAMETHASONE SODIUM PHOSPHATE 10 MG/ML IJ SOLN
10.0000 mg | Freq: Once | INTRAMUSCULAR | Status: AC
  Administered 2023-03-13: 10 mg via INTRAVENOUS
  Filled 2023-03-13: qty 1

## 2023-03-13 MED ORDER — AMOXICILLIN-POT CLAVULANATE 875-125 MG PO TABS: 1.0000 | ORAL_TABLET | Freq: Two times a day (BID) | ORAL | 0 refills | Status: DC

## 2023-03-13 MED ORDER — AZITHROMYCIN 250 MG PO TABS: 250.0000 mg | ORAL_TABLET | Freq: Every day | ORAL | 0 refills | Status: DC

## 2023-03-13 MED ORDER — ALBUTEROL SULFATE (2.5 MG/3ML) 0.083% IN NEBU
5.0000 mg | INHALATION_SOLUTION | Freq: Once | RESPIRATORY_TRACT | Status: AC
  Administered 2023-03-13: 5 mg via RESPIRATORY_TRACT
  Filled 2023-03-13: qty 6

## 2023-03-13 MED ORDER — AMOXICILLIN-POT CLAVULANATE 875-125 MG PO TABS
1.0000 | ORAL_TABLET | Freq: Once | ORAL | Status: AC
  Administered 2023-03-13: 1 via ORAL
  Filled 2023-03-13: qty 1

## 2023-03-13 MED ORDER — IOHEXOL 350 MG/ML SOLN
75.0000 mL | Freq: Once | INTRAVENOUS | Status: AC | PRN
  Administered 2023-03-13: 75 mL via INTRAVENOUS

## 2023-03-13 MED ORDER — BENZONATATE 200 MG PO CAPS
200.0000 mg | ORAL_CAPSULE | Freq: Three times a day (TID) | ORAL | 0 refills | Status: DC | PRN
Start: 2023-03-13 — End: 2023-08-05

## 2023-03-13 NOTE — ED Triage Notes (Signed)
 Pt reports using her inhaler at home w/o relief. Pt reports trying OTC medication w/o relief. Pt reports temp of 101.63F at home.

## 2023-03-13 NOTE — Telephone Encounter (Addendum)
 Chief Complaint: shortness of breath Symptoms: Shortness of breath, wheezing, cough Frequency: since Saturday Pertinent Negatives: Patient denies n/a Disposition: [x] ED /[] Urgent Care (no appt availability in office) / [] Appointment(In office/virtual)/ []  [facility]/ [] Home Care/ [] Refused Recommended Disposition /[] [facility]/ []  Follow-up with PCP Additional Notes: Patient called stating she has been sick since Saturday with a cough (yellow phlegm), intermittent fever up to 101, and worsening shortness of breath. Patient states now when she even walks short distances or talks for extended lengths she becomes moderately short of breath, which was audible over the phone. Patient denies any home oxygen use and states she has been using rescue inhaler that doesn't help much. Advised patient to check her O2 level with her pulse ox at home. Patient was walking and had pulse ox on and her o2 was reading 87 while HR > 100. Advised patient to be seen at ED asap for evaluation. Patient states her mother in law will take her to [facility] now.    Copied from CRM [id number]. Topic: Clinical - Red Word Triage >> Mar 13, 2023 10:36 AM Stephanie Harper wrote: Red Word that prompted transfer to Nurse Triage: Patient stated she has been sick since Saturday and now she having a hard time breathing , thinks she may need a chest xray Reason for Disposition  Oxygen level (e.g., pulse oximetry) 90 percent or lower  Answer Assessment - Initial Assessment Questions 1. RESPIRATORY STATUS: "Describe your breathing?" (e.g., wheezing, shortness of breath, unable to speak, severe coughing)      Shortness of breath, wheezing 2. ONSET: "When did this breathing problem begin?"      Saturday 3. PATTERN "Does the difficult breathing come and go, or has it been constant since it started?"      Constant now 4. SEVERITY: "How bad is your breathing?" (e.g., mild, moderate, severe)    - MILD: No SOB at rest, mild SOB  with walking, speaks normally in sentences, can lie down, no retractions, pulse < 100.    - MODERATE: SOB at rest, SOB with minimal exertion and prefers to sit, cannot lie down flat, speaks in phrases, mild retractions, audible wheezing, pulse 100-120.    - SEVERE: Very SOB at rest, speaks in single words, struggling to breathe, sitting hunched forward, retractions, pulse > 120      Moderate  5. RECURRENT SYMPTOM: "Have you had difficulty breathing before?" If Yes, ask: "When was the last time?" and "What happened that time?"      No 6. CARDIAC HISTORY: "Do you have any history of heart disease?" (e.g., heart attack, angina, bypass surgery, angioplasty)      High blood pressure 7. LUNG HISTORY: "Do you have any history of lung disease?"  (e.g., pulmonary embolus, asthma, emphysema)       Strong hx of bronchitis, possible asthma 8. CAUSE: "What do you think is causing the breathing problem?"      URI symptoms 9. OTHER SYMPTOMS: "Do you have any other symptoms? (e.g., dizziness, runny nose, cough, chest pain, fever)     Intermittent fever, cough with yellow phlegm, nasal congestion, sweats 10. O2 SATURATION MONITOR:  "Do you use an oxygen saturation monitor (pulse oximeter) at home?" If Yes, ask: "What is your reading (oxygen level) today?" "What is your usual oxygen saturation reading?" (e.g., 95%)       HR 91, 93  Protocols used: Breathing Difficulty-A-AH

## 2023-03-13 NOTE — ED Triage Notes (Signed)
 Pt arrived via [facility] c/o SOB and non-productive cough since Saturday. Pt endorses generalized body aches.

## 2023-03-13 NOTE — Discharge Instructions (Signed)
 Your workup today shows that you have influenza.  CT imaging today also shows a likely developing pneumonia.  You have been prescribed antibiotics, please take as directed until finished.  You have also been prescribed medication to help with your cough.  I recommend that you continue to use your albuterol inhaler 1 to 2 puffs every 4-6 hours as needed.  Tylenol every 4 hours for fever and/or bodyaches.  Please follow-up with your primary care provider for recheck.  Return to emergency department for any new or worsening symptoms.

## 2023-03-13 NOTE — ED Provider Notes (Signed)
 [place] EMERGENCY DEPARTMENT AT [facility] Provider Note   CSN: [id number] Arrival date & time: 03/13/23  1128     History  Chief Complaint  Patient presents with   Shortness of Breath    Stephanie Harper is a 53 y.o. female.   Shortness of Breath Associated symptoms: cough and fever   Associated symptoms: no abdominal pain, no chest pain, no headaches, no neck pain, no sore throat and no vomiting         Stephanie Harper is a 53 y.o. female past medical history of anxiety, hypertension, who presents to the Emergency Department complaining of fatigue, cough, shortness of breath,  and generalized bodyaches with fever.  Symptoms began 5 days ago.  Fevers at home max of near 102.  Taking Tylenol for her fever.  She is also been taking over-the-counter cold and cough medication without relief.  Cough has been mostly nonproductive.  States she feels short of breath, also having exertional dyspnea.  Using her albuterol inhaler at home without improvement.  Denies abdominal pain, chest pain vomiting or diarrhea.    Home Medications Prior to Admission medications   Medication Sig Start Date End Date Taking? Authorizing Provider  albuterol (VENTOLIN HFA) 108 (90 Base) MCG/ACT inhaler TAKE 2 PUFFS BY MOUTH EVERY 6 HOURS AS NEEDED 06/18/22   Plotnikov, Georgina Quint, MD  ALPRAZolam Prudy Feeler) 1 MG tablet May take 1 tablet (1 mg total) by mouth 3 (three) times daily as needed. May also take 1 tablet (1 mg total) 3 (three) times daily as needed. 02/18/23   Plotnikov, Georgina Quint, MD  BREO ELLIPTA 100-25 MCG/ACT AEPB INHALE 1 PUFF BY MOUTH EVERY DAY Patient taking differently: Inhale 1 puff into the lungs daily as needed (respiratory issues.). 03/19/21   Plotnikov, Georgina Quint, MD  buPROPion ER [facility] SR) 100 MG 12 hr tablet Take 1 tablet (100 mg total) by mouth 2 (two) times daily. 11/19/22   Plotnikov, Georgina Quint, MD  Cholecalciferol (VITAMIN D3) 50 MCG (2000 UT) capsule Take 1  capsule (2,000 Units total) by mouth daily. Patient taking differently: Take 5,000 Units by mouth daily. 04/24/21   Plotnikov, Georgina Quint, MD  escitalopram (LEXAPRO) 20 MG tablet Take 1 tablet (20 mg total) by mouth at bedtime. 11/19/22   Plotnikov, Georgina Quint, MD  furosemide (LASIX) 40 MG tablet Take 1 tablet (40 mg total) by mouth daily as needed. Patient taking differently: Take 40 mg by mouth daily as needed for fluid. 06/15/19   Plotnikov, Georgina Quint, MD  HYDROcodone-acetaminophen (NORCO) 10-325 MG tablet Take 0.5 tablets by mouth every 6 (six) hours as needed. 02/18/23   Plotnikov, Georgina Quint, MD  HYDROcodone-acetaminophen (NORCO) 10-325 MG tablet Take 0.5 tablets by mouth every 6 (six) hours as needed for severe pain (pain score 7-10). 02/18/23   Plotnikov, Georgina Quint, MD  HYDROcodone-acetaminophen (NORCO) 10-325 MG tablet Take 0.5 tablets by mouth every 6 (six) hours as needed. 02/18/23   Plotnikov, Georgina Quint, MD  ibuprofen (ADVIL) 600 MG tablet TAKE 1 TABLET (600 MG TOTAL) BY MOUTH 2 (TWO) TIMES DAILY AS NEEDED. 12/23/22   Plotnikov, Georgina Quint, MD  Melatonin 10 MG CAPS Take 10 mg by mouth at bedtime as needed (Sleep).    [provider]  meloxicam (MOBIC) 15 MG tablet TAKE 1 TABLET (15 MG TOTAL) BY MOUTH DAILY. Patient taking differently: Take 15 mg by mouth daily as needed for pain. 03/25/22   Plotnikov, Georgina Quint, MD  Multiple Vitamin (MULTIVITAMIN)  tablet Take 1 tablet by mouth in the morning.    [provider]  niacinamide 500 MG tablet Take 500 mg by mouth 2 (two) times daily.  12/20/16   [provider]  oxyCODONE (OXY IR/ROXICODONE) 5 MG immediate release tablet Take 1 tablet (5 mg total) by mouth every 6 (six) hours as needed for severe pain (pain score 7-10). 02/03/23   Kinsinger, De Blanch, MD  pentoxifylline (TRENTAL) 400 MG CR tablet Take 400 mg by mouth in the morning and at bedtime. 10/11/15   [provider]  triamcinolone ointment (KENALOG) 0.1 % Apply  1 Application topically daily as needed (Dermatitis). 01/02/23   [provider]  triamterene-hydrochlorothiazide (MAXZIDE-25) 37.5-25 MG tablet TAKE 0.5-1 TABLETS BY MOUTH DAILY. 10/18/22   Plotnikov, Georgina Quint, MD      Allergies    Adhesive [tape]    Review of Systems   Review of Systems  Constitutional:  Positive for chills and fever. Negative for appetite change.  HENT:  Negative for sore throat.   Respiratory:  Positive for cough, chest tightness and shortness of breath.   Cardiovascular:  Negative for chest pain.  Gastrointestinal:  Negative for abdominal pain, diarrhea, nausea and vomiting.  Genitourinary:  Negative for difficulty urinating, dysuria and flank pain.  Musculoskeletal:  Negative for back pain, neck pain and neck stiffness.  Neurological:  Negative for dizziness, syncope, weakness and headaches.  Psychiatric/Behavioral:  Negative for confusion.     Physical Exam Updated Vital Signs BP 96/85 (BP Location: Right Wrist)   Pulse 86   Temp 98.8 F (37.1 C) (Oral)   Resp (!) 22   Ht 5\' 7"  (1.702 m)   Wt (!) 162.8 kg   SpO2 96%   BMI 56.21 kg/m  Physical Exam Vitals and nursing note reviewed.  Constitutional:      General: She is not in acute distress.    Appearance: She is well-developed. She is not ill-appearing or toxic-appearing.  HENT:     Mouth/Throat:     Mouth: Mucous membranes are moist.     Pharynx: Oropharynx is clear.  Cardiovascular:     Rate and Rhythm: Normal rate and regular rhythm.     Pulses: Normal pulses.  Pulmonary:     Effort: Pulmonary effort is normal.     Breath sounds: Wheezing present.     Comments: Diminished lung sounds bilaterally with expiratory wheezes bilaterally.  No increased work of breathing. Abdominal:     Palpations: Abdomen is soft.     Tenderness: There is no abdominal tenderness.  Musculoskeletal:        General: Normal range of motion.     Cervical back: Normal range of motion. No tenderness.   Lymphadenopathy:     Cervical: No cervical adenopathy.  Skin:    General: Skin is warm.     Capillary Refill: Capillary refill takes less than 2 seconds.  Neurological:     General: No focal deficit present.     Mental Status: She is alert.     Sensory: No sensory deficit.     Motor: No weakness.     ED Results / Procedures / Treatments   Labs (all labs ordered are listed, but only abnormal results are displayed) Labs Reviewed  RESP PANEL BY RT-PCR (RSV, FLU A&B, COVID)  RVPGX2 - Abnormal; Notable for the following components:      Result Value   Influenza A by PCR POSITIVE (*)    All other components within normal  limits  CBC WITH DIFFERENTIAL/PLATELET - Abnormal; Notable for the following components:   RBC 5.23 (*)    Hemoglobin 15.6 (*)    HCT 47.0 (*)    All other components within normal limits  BASIC METABOLIC PANEL - Abnormal; Notable for the following components:   Potassium 3.4 (*)    Glucose, Bld 141 (*)    All other components within normal limits  D-DIMER, QUANTITATIVE (NOT AT [facility]) - Abnormal; Notable for the following components:   D-Dimer, Quant 0.87 (*)    All other components within normal limits    EKG None  Radiology CT Angio Chest PE W and/or Wo Contrast Result Date: 03/13/2023 CLINICAL DATA:  Cough and shortness of breath EXAM: CT ANGIOGRAPHY CHEST WITH CONTRAST TECHNIQUE: Multidetector CT imaging of the chest was performed using the standard protocol during bolus administration of intravenous contrast. Multiplanar CT image reconstructions and MIPs were obtained to evaluate the vascular anatomy. RADIATION DOSE REDUCTION: This exam was performed according to the departmental dose-optimization program which includes automated exposure control, adjustment of the mA and/or kV according to patient size and/or use of iterative reconstruction technique. CONTRAST:  75mL OMNIPAQUE IOHEXOL 350 MG/ML SOLN COMPARISON:  Chest x-ray from earlier in the same day.  FINDINGS: Cardiovascular: Thoracic aorta and its branches are within normal limits. No aneurysmal dilatation is seen. No significant atherosclerotic calcifications are noted. The pulmonary artery shows a normal branching pattern bilaterally. No intraluminal filling defect to suggest pulmonary embolism is noted. No significant coronary calcifications are noted. Mediastinum/Nodes: Thoracic inlet is within normal limits. No hilar or mediastinal adenopathy is noted. The esophagus as visualized is within normal limits. Lungs/Pleura: Lungs are overall well aerated bilaterally. Mild tree-in-bud changes are noted in the right lower lobe. This may represent some very early inflammatory change. No effusion is noted. No confluent infiltrate is seen. Upper Abdomen: Gallbladder has been surgically removed. No other focal abnormality is noted. Musculoskeletal: Degenerative changes of the thoracic spine are noted. No acute rib abnormality is noted. Review of the MIP images confirms the above findings. IMPRESSION: No evidence of pulmonary emboli. Mild tree-in-bud changes in the right lower lobe which may represent very early infiltrate. Electronically Signed   By: Alcide Clever M.D.   On: 03/13/2023 23:03   DG Chest 2 View Result Date: 03/13/2023 CLINICAL DATA:  Nonproductive cough and shortness of breath for several days. EXAM: CHEST - 2 VIEW COMPARISON:  None Available. FINDINGS: The heart size and mediastinal contours are within normal limits. Both lungs are clear. Right shoulder prosthesis noted, as well as internal fixation device in the proximal left humerus. IMPRESSION: No active cardiopulmonary disease. Electronically Signed   By: Danae Orleans M.D.   On: 03/13/2023 14:16    Procedures Procedures    Medications Ordered in ED Medications  albuterol (PROVENTIL) (2.5 MG/3ML) 0.083% nebulizer solution 5 mg (has no administration in time range)  dexamethasone (DECADRON) injection 10 mg (has no administration in time  range)    ED Course/ Medical Decision Making/ A&P                                 Medical Decision Making Patient here with 5-day history of fatigue, cough, shortness of breath generalized bodyaches and fever.  Cough mostly nonproductive.  Shortness of breath is exertional.  No chest pain.   On exam, patient actively wheezing, no increased work of breathing.  No hypoxia but tachypneic  Suspect viral process, PE, pneumonia also considered doubt ACS  Amount and/or Complexity of Data Reviewed Labs: ordered.    Details: Labs no evidence of leukocytosis, chemistries without significant derangement.  Respiratory panel positive for flu A.  D-dimer elevated at 0.87 Radiology: ordered.    Details: Chest x-ray without evidence of acute cardiopulmonary process  Patient had elevated D-dimer could not be aged related rule out, CT angio of the chest ordered for further evaluation.  No evidence of PE but does show possible very early infiltrate right lower lobe Discussion of management or test interpretation with external provider(s):    Patient flu A positive, CT imaging shows likely early infiltrate.  No hypoxia here.  tolerating oral fluids. Will treat with antibiotics,.  She was given albuterol nebs here with some improvement of her wheezing.  She has MDI at home.  Appears appropriate for discharge home, agreeable to close outpatient follow-up with PCP.  Return precautions were discussed.  Risk Prescription drug management.           Final Clinical Impression(s) / ED Diagnoses Final diagnoses:  Influenza  SOB (shortness of breath)    Rx / DC Orders ED Discharge Orders     None         Rosey Bath 03/13/23 2337    Benjiman Core, MD 03/13/23 [phone]

## 2023-03-20 ENCOUNTER — Other Ambulatory Visit: Payer: Self-pay | Admitting: Internal Medicine

## 2023-03-26 ENCOUNTER — Encounter: Payer: Self-pay | Admitting: Internal Medicine

## 2023-03-26 ENCOUNTER — Ambulatory Visit: Admitting: Internal Medicine

## 2023-03-26 VITALS — BP 120/80 | HR 76 | Temp 98.3°F | Ht 67.0 in | Wt 365.0 lb

## 2023-03-26 DIAGNOSIS — E876 Hypokalemia: Secondary | ICD-10-CM | POA: Diagnosis not present

## 2023-03-26 DIAGNOSIS — F17200 Nicotine dependence, unspecified, uncomplicated: Secondary | ICD-10-CM

## 2023-03-26 DIAGNOSIS — F419 Anxiety disorder, unspecified: Secondary | ICD-10-CM | POA: Diagnosis not present

## 2023-03-26 DIAGNOSIS — R739 Hyperglycemia, unspecified: Secondary | ICD-10-CM | POA: Diagnosis not present

## 2023-03-26 DIAGNOSIS — J069 Acute upper respiratory infection, unspecified: Secondary | ICD-10-CM

## 2023-03-26 DIAGNOSIS — I10 Essential (primary) hypertension: Secondary | ICD-10-CM

## 2023-03-26 LAB — COMPREHENSIVE METABOLIC PANEL
ALT: 16 U/L (ref 0–35)
AST: 16 U/L (ref 0–37)
Albumin: 3.9 g/dL (ref 3.5–5.2)
Alkaline Phosphatase: 83 U/L (ref 39–117)
BUN: 14 mg/dL (ref 6–23)
CO2: 32 meq/L (ref 19–32)
Calcium: 9.4 mg/dL (ref 8.4–10.5)
Chloride: 101 meq/L (ref 96–112)
Creatinine, Ser: 0.57 mg/dL (ref 0.40–1.20)
GFR: 104.77 mL/min (ref >60.00)
Glucose, Bld: 112 mg/dL — ABNORMAL HIGH (ref 70–99)
Potassium: 3.9 meq/L (ref 3.5–5.1)
Sodium: 140 meq/L (ref 135–145)
Total Bilirubin: 0.5 mg/dL (ref 0.2–1.2)
Total Protein: 7.3 g/dL (ref 6.0–8.3)

## 2023-03-26 LAB — HEMOGLOBIN A1C: Hgb A1c MFr Bld: 6.2 % (ref 4.6–6.5)

## 2023-03-26 NOTE — Assessment & Plan Note (Signed)
 Resolved

## 2023-03-26 NOTE — Assessment & Plan Note (Signed)
Low-salt diet Furosemide

## 2023-03-26 NOTE — Assessment & Plan Note (Signed)
 Chronic  Cont on Xanax prn Potential benefits of a long term benzodiazepines  use as well as potential risks  and complications were explained to the patient and were aknowledged. Situational. Continue with Wellbutrin and Paxil.  Psychology consultation - Seeing Dr Bosie Clos for counseling. Better

## 2023-03-26 NOTE — Assessment & Plan Note (Signed)
Repeat CMET.  

## 2023-03-26 NOTE — Assessment & Plan Note (Signed)
 Check A1c.

## 2023-03-26 NOTE — Progress Notes (Signed)
 Subjective:  Patient ID: Stephanie Harper, female    DOB: [birth year]  Age: 53 y.o. MRN: [id number]  CC: Follow-up   HPI Stephanie Harper presents for a flu (type A+) and dyspnea on 03/13/23. Back to nl. Not smoking now.  Per ER: "Patient here with 5-day history of fatigue, cough, shortness of breath generalized bodyaches and fever.  Cough mostly nonproductive.  Shortness of breath is exertional.  No chest pain.     On exam, patient actively wheezing, no increased work of breathing.  No hypoxia but tachypneic   Suspect viral process, PE, pneumonia also considered doubt ACS   Amount and/or Complexity of Data Reviewed Labs: ordered.    Details: Labs no evidence of leukocytosis, chemistries without significant derangement.  Respiratory panel positive for flu A.  D-dimer elevated at 0.87 Radiology: ordered.    Details: Chest x-ray without evidence of acute cardiopulmonary process   Patient had elevated D-dimer could not be aged related rule out, CT angio of the chest ordered for further evaluation.  No evidence of PE but does show possible very early infiltrate right lower lobe Discussion of management or test interpretation with external provider(s):      Patient flu A positive, CT imaging shows likely early infiltrate.  No hypoxia here.  tolerating oral fluids. Will treat with antibiotics,.  She was given albuterol nebs here with some improvement of her wheezing.  She has MDI at home.  Appears appropriate for discharge home, agreeable to close outpatient follow-up with PCP.  Return precautions were discussed."  Outpatient Medications Prior to Visit  Medication Sig Dispense Refill   albuterol (VENTOLIN HFA) 108 (90 Base) MCG/ACT inhaler TAKE 2 PUFFS BY MOUTH EVERY 6 HOURS AS NEEDED 6.7 each 2   ALPRAZolam (XANAX) 1 MG tablet May take 1 tablet (1 mg total) by mouth 3 (three) times daily as needed. May also take 1 tablet (1 mg total) 3 (three) times daily as needed. 90 tablet 3   BREO  ELLIPTA 100-25 MCG/ACT AEPB INHALE 1 PUFF BY MOUTH EVERY DAY (Patient taking differently: Inhale 1 puff into the lungs daily as needed (respiratory issues.).) 180 each 3   buPROPion ER (WELLBUTRIN SR) 100 MG 12 hr tablet Take 1 tablet (100 mg total) by mouth 2 (two) times daily. 180 tablet 2   Cholecalciferol (VITAMIN D3) 50 MCG (2000 UT) capsule Take 1 capsule (2,000 Units total) by mouth daily. (Patient taking differently: Take 5,000 Units by mouth daily.) 100 capsule 3   [facility] 12 HOUR NASAL DECONGESTANT 120 MG 12 hr tablet Take 120 mg by mouth 2 (two) times daily as needed.     escitalopram (LEXAPRO) 20 MG tablet Take 1 tablet (20 mg total) by mouth at bedtime. 90 tablet 1   furosemide (LASIX) 40 MG tablet Take 1 tablet (40 mg total) by mouth daily as needed. (Patient taking differently: Take 40 mg by mouth daily as needed for fluid.) 90 tablet 3   HYDROcodone-acetaminophen (NORCO) 10-325 MG tablet Take 0.5 tablets by mouth every 6 (six) hours as needed. 60 tablet 0   HYDROcodone-acetaminophen (NORCO) 10-325 MG tablet Take 0.5 tablets by mouth every 6 (six) hours as needed for severe pain (pain score 7-10). 60 tablet 0   HYDROcodone-acetaminophen (NORCO) 10-325 MG tablet Take 0.5 tablets by mouth every 6 (six) hours as needed. 60 tablet 0   ibuprofen (ADVIL) 600 MG tablet TAKE 1 TABLET (600 MG TOTAL) BY MOUTH 2 (TWO) TIMES DAILY AS NEEDED. 180 tablet 1  Melatonin 10 MG CAPS Take 10 mg by mouth at bedtime as needed (Sleep).     meloxicam (MOBIC) 15 MG tablet TAKE 1 TABLET (15 MG TOTAL) BY MOUTH DAILY. 30 tablet 3   Multiple Vitamin (MULTIVITAMIN) tablet Take 1 tablet by mouth in the morning.     niacinamide 500 MG tablet Take 500 mg by mouth 2 (two) times daily.      pentoxifylline (TRENTAL) 400 MG CR tablet Take 400 mg by mouth in the morning and at bedtime.     triamcinolone ointment (KENALOG) 0.1 % Apply 1 Application topically daily as needed (Dermatitis).     triamterene-hydrochlorothiazide  (MAXZIDE-25) 37.5-25 MG tablet TAKE 0.5-1 TABLETS BY MOUTH DAILY. 90 tablet 3   amoxicillin-clavulanate (AUGMENTIN) 875-125 MG tablet Take 1 tablet by mouth every 12 (twelve) hours. (Patient not taking: Reported on 03/26/2023) 14 tablet 0   azithromycin (ZITHROMAX) 250 MG tablet Take 1 tablet (250 mg total) by mouth daily. Take first 2 tablets together, then 1 every day until finished. (Patient not taking: Reported on 03/26/2023) 6 tablet 0   benzonatate (TESSALON) 200 MG capsule Take 1 capsule (200 mg total) by mouth 3 (three) times daily as needed. As needed for cough, swallow whole, do not chew (Patient not taking: Reported on 03/26/2023) 21 capsule 0   oxyCODONE (OXY IR/ROXICODONE) 5 MG immediate release tablet Take 1 tablet (5 mg total) by mouth every 6 (six) hours as needed for severe pain (pain score 7-10). 10 tablet 0   No facility-administered medications prior to visit.    ROS: Review of Systems  Constitutional:  Positive for unexpected weight change. Negative for activity change, appetite change, chills and fatigue.  HENT:  Negative for congestion, mouth sores and sinus pressure.   Eyes:  Negative for visual disturbance.  Respiratory:  Negative for cough, chest tightness, shortness of breath and wheezing.   Gastrointestinal:  Negative for abdominal pain and nausea.  Genitourinary:  Negative for difficulty urinating, frequency and vaginal pain.  Musculoskeletal:  Negative for back pain and gait problem.  Skin:  Negative for pallor and rash.  Neurological:  Negative for dizziness, tremors, weakness, numbness and headaches.  Psychiatric/Behavioral:  Negative for confusion and sleep disturbance.     Objective:  BP 120/80   Pulse 76   Temp 98.3 F (36.8 C) (Oral)   Ht 5\' 7"  (1.702 m)   Wt (!) 365 lb (165.6 kg)   SpO2 98%   BMI 57.17 kg/m   BP Readings from Last 3 Encounters:  03/26/23 120/80  03/13/23 (!) 148/95  02/03/23 120/76    Wt Readings from Last 3 Encounters:   03/26/23 (!) 365 lb (165.6 kg)  03/13/23 (!) 358 lb 14.5 oz (162.8 kg)  02/03/23 (!) 358 lb 12.8 oz (162.8 kg)    Physical Exam Constitutional:      General: She is not in acute distress.    Appearance: She is well-developed. She is obese.  HENT:     Head: Normocephalic.     Right Ear: External ear normal.     Left Ear: External ear normal.     Nose: Nose normal.  Eyes:     General:        Right eye: No discharge.        Left eye: No discharge.     Conjunctiva/sclera: Conjunctivae normal.     Pupils: Pupils are equal, round, and reactive to light.  Neck:     Thyroid: No thyromegaly.  Vascular: No JVD.     Trachea: No tracheal deviation.  Cardiovascular:     Rate and Rhythm: Normal rate and regular rhythm.     Heart sounds: Normal heart sounds.  Pulmonary:     Effort: No respiratory distress.     Breath sounds: No stridor. No wheezing.  Abdominal:     General: Bowel sounds are normal. There is no distension.     Palpations: Abdomen is soft. There is no mass.     Tenderness: There is no abdominal tenderness. There is no guarding or rebound.  Musculoskeletal:        General: No tenderness.     Cervical back: Normal range of motion and neck supple. No rigidity.  Lymphadenopathy:     Cervical: No cervical adenopathy.  Skin:    Findings: No erythema or rash.  Neurological:     Cranial Nerves: No cranial nerve deficit.     Motor: No abnormal muscle tone.     Coordination: Coordination normal.     Deep Tendon Reflexes: Reflexes normal.  Psychiatric:        Behavior: Behavior normal.        Thought Content: Thought content normal.        Judgment: Judgment normal.     Lab Results  Component Value Date   WBC 8.6 03/13/2023   HGB 15.6 (H) 03/13/2023   HCT 47.0 (H) 03/13/2023   PLT 208 03/13/2023   GLUCOSE 141 (H) 03/13/2023   CHOL 171 01/24/2021   TRIG 102.0 01/24/2021   HDL 49.50 01/24/2021   LDLCALC 101 (H) 01/24/2021   ALT 20 01/24/2023   AST 17  01/24/2023   NA 136 03/13/2023   K 3.4 (L) 03/13/2023   CL 99 03/13/2023   CREATININE 0.53 03/13/2023   BUN 11 03/13/2023   CO2 24 03/13/2023   TSH 2.16 11/19/2022   INR 1.0 08/09/2007   HGBA1C 6.1 01/24/2021    CT Angio Chest PE W and/or Wo Contrast Result Date: 03/13/2023 CLINICAL DATA:  Cough and shortness of breath EXAM: CT ANGIOGRAPHY CHEST WITH CONTRAST TECHNIQUE: Multidetector CT imaging of the chest was performed using the standard protocol during bolus administration of intravenous contrast. Multiplanar CT image reconstructions and MIPs were obtained to evaluate the vascular anatomy. RADIATION DOSE REDUCTION: This exam was performed according to the departmental dose-optimization program which includes automated exposure control, adjustment of the mA and/or kV according to patient size and/or use of iterative reconstruction technique. CONTRAST:  75mL OMNIPAQUE IOHEXOL 350 MG/ML SOLN COMPARISON:  Chest x-ray from earlier in the same day. FINDINGS: Cardiovascular: Thoracic aorta and its branches are within normal limits. No aneurysmal dilatation is seen. No significant atherosclerotic calcifications are noted. The pulmonary artery shows a normal branching pattern bilaterally. No intraluminal filling defect to suggest pulmonary embolism is noted. No significant coronary calcifications are noted. Mediastinum/Nodes: Thoracic inlet is within normal limits. No hilar or mediastinal adenopathy is noted. The esophagus as visualized is within normal limits. Lungs/Pleura: Lungs are overall well aerated bilaterally. Mild tree-in-bud changes are noted in the right lower lobe. This may represent some very early inflammatory change. No effusion is noted. No confluent infiltrate is seen. Upper Abdomen: Gallbladder has been surgically removed. No other focal abnormality is noted. Musculoskeletal: Degenerative changes of the thoracic spine are noted. No acute rib abnormality is noted. Review of the MIP images  confirms the above findings. IMPRESSION: No evidence of pulmonary emboli. Mild tree-in-bud changes in the right lower lobe which may  represent very early infiltrate. Electronically Signed   By: Alcide Clever M.D.   On: 03/13/2023 23:03   DG Chest 2 View Result Date: 03/13/2023 CLINICAL DATA:  Nonproductive cough and shortness of breath for several days. EXAM: CHEST - 2 VIEW COMPARISON:  None Available. FINDINGS: The heart size and mediastinal contours are within normal limits. Both lungs are clear. Right shoulder prosthesis noted, as well as internal fixation device in the proximal left humerus. IMPRESSION: No active cardiopulmonary disease. Electronically Signed   By: Danae Orleans M.D.   On: 03/13/2023 14:16    Assessment & Plan:   Problem List Items Addressed This Visit     Chronic anxiety   Chronic  Cont on Xanax prn Potential benefits of a long term benzodiazepines  use as well as potential risks  and complications were explained to the patient and were aknowledged. Situational. Continue with Wellbutrin and Paxil.  Psychology consultation - Seeing Dr Bosie Clos for counseling. Better      TOBACCO USE DISORDER/SMOKER-SMOKING CESSATION DISCUSSED - Primary   Recent flu (type A+) and dyspnea on 03/13/23. Back to nl. Not smoking now!      Essential hypertension   Low-salt diet Furosemide      Acute upper respiratory infection   Resolved       Hypokalemia   Repeat CMET      Relevant Orders   Comprehensive metabolic panel   Hyperglycemia   Check A1c      Relevant Orders   Hemoglobin A1c      No orders of the defined types were placed in this encounter.     Follow-up: Return for a follow-up visit.  Sonda Primes, MD

## 2023-03-26 NOTE — Assessment & Plan Note (Signed)
 Recent flu (type A+) and dyspnea on 03/13/23. Back to nl. Not smoking now!

## 2023-03-27 ENCOUNTER — Encounter: Payer: Self-pay | Admitting: Internal Medicine

## 2023-04-15 ENCOUNTER — Ambulatory Visit: Admitting: Internal Medicine

## 2023-05-22 ENCOUNTER — Encounter: Payer: Self-pay | Admitting: Internal Medicine

## 2023-05-22 ENCOUNTER — Ambulatory Visit: Admitting: Internal Medicine

## 2023-05-22 VITALS — BP 118/70 | HR 73 | Temp 98.2°F | Ht 67.0 in | Wt 374.0 lb

## 2023-05-22 DIAGNOSIS — F410 Panic disorder [episodic paroxysmal anxiety] without agoraphobia: Secondary | ICD-10-CM | POA: Diagnosis not present

## 2023-05-22 DIAGNOSIS — F419 Anxiety disorder, unspecified: Secondary | ICD-10-CM | POA: Diagnosis not present

## 2023-05-22 DIAGNOSIS — R61 Generalized hyperhidrosis: Secondary | ICD-10-CM

## 2023-05-22 DIAGNOSIS — I10 Essential (primary) hypertension: Secondary | ICD-10-CM

## 2023-05-22 LAB — COMPREHENSIVE METABOLIC PANEL WITH GFR
ALT: 23 U/L (ref 0–35)
AST: 20 U/L (ref 0–37)
Albumin: 4.2 g/dL (ref 3.5–5.2)
Alkaline Phosphatase: 92 U/L (ref 39–117)
BUN: 12 mg/dL (ref 6–23)
CO2: 31 meq/L (ref 19–32)
Calcium: 9.3 mg/dL (ref 8.4–10.5)
Chloride: 98 meq/L (ref 96–112)
Creatinine, Ser: 0.54 mg/dL (ref 0.40–1.20)
GFR: 106.03 mL/min (ref >60.00)
Glucose, Bld: 87 mg/dL (ref 70–99)
Potassium: 3.5 meq/L (ref 3.5–5.1)
Sodium: 137 meq/L (ref 135–145)
Total Bilirubin: 0.5 mg/dL (ref 0.2–1.2)
Total Protein: 7.7 g/dL (ref 6.0–8.3)

## 2023-05-22 LAB — TSH: TSH: 4.01 u[IU]/mL (ref 0.35–5.50)

## 2023-05-22 MED ORDER — HYDROCODONE-ACETAMINOPHEN 10-325 MG PO TABS
0.5000 | ORAL_TABLET | Freq: Four times a day (QID) | ORAL | 0 refills | Status: DC | PRN
Start: 2023-05-22 — End: 2023-08-25

## 2023-05-22 MED ORDER — ALPRAZOLAM 1 MG PO TABS: ORAL_TABLET | ORAL | 3 refills | Status: DC

## 2023-05-22 MED ORDER — HYDROCODONE-ACETAMINOPHEN 10-325 MG PO TABS: 0.5000 | ORAL_TABLET | Freq: Four times a day (QID) | ORAL | 0 refills | Status: DC | PRN

## 2023-05-22 NOTE — Assessment & Plan Note (Signed)
 Xanax prn  Potential benefits of a long term benzodiazepines  use as well as potential risks  and complications were explained to the patient and were aknowledged.

## 2023-05-22 NOTE — Progress Notes (Signed)
 Subjective:  Patient ID: Stephanie Harper, female    DOB: [birth year]  Age: 53 y.o. MRN: [id number]  CC: Medical Management of Chronic Issues (3 mnth f/u )   HPI Stephanie Harper presents for LBP, OA, HA Not smoking  Outpatient Medications Prior to Visit  Medication Sig Dispense Refill   albuterol  (VENTOLIN  HFA) 108 (90 Base) MCG/ACT inhaler TAKE 2 PUFFS BY MOUTH EVERY 6 HOURS AS NEEDED 6.7 each 2   BREO ELLIPTA  100-25 MCG/ACT AEPB INHALE 1 PUFF BY MOUTH EVERY DAY (Patient taking differently: Inhale 1 puff into the lungs daily as needed (respiratory issues.).) 180 each 3   buPROPion  ER (WELLBUTRIN  SR) 100 MG 12 hr tablet Take 1 tablet (100 mg total) by mouth 2 (two) times daily. 180 tablet 2   Cholecalciferol  (VITAMIN D3) 50 MCG (2000 UT) capsule Take 1 capsule (2,000 Units total) by mouth daily. (Patient taking differently: Take 5,000 Units by mouth daily.) 100 capsule 3   [facility] 12 HOUR NASAL DECONGESTANT 120 MG 12 hr tablet Take 120 mg by mouth 2 (two) times daily as needed.     escitalopram  (LEXAPRO ) 20 MG tablet Take 1 tablet (20 mg total) by mouth at bedtime. 90 tablet 1   furosemide  (LASIX ) 40 MG tablet Take 1 tablet (40 mg total) by mouth daily as needed. (Patient taking differently: Take 40 mg by mouth daily as needed for fluid.) 90 tablet 3   ibuprofen  (ADVIL ) 600 MG tablet TAKE 1 TABLET (600 MG TOTAL) BY MOUTH 2 (TWO) TIMES DAILY AS NEEDED. 180 tablet 1   Melatonin 10 MG CAPS Take 10 mg by mouth at bedtime as needed (Sleep).     meloxicam  (MOBIC ) 15 MG tablet TAKE 1 TABLET (15 MG TOTAL) BY MOUTH DAILY. 30 tablet 3   Multiple Vitamin (MULTIVITAMIN) tablet Take 1 tablet by mouth in the morning.     niacinamide 500 MG tablet Take 500 mg by mouth 2 (two) times daily.      pentoxifylline (TRENTAL) 400 MG CR tablet Take 400 mg by mouth in the morning and at bedtime.     triamcinolone  ointment (KENALOG ) 0.1 % Apply 1 Application topically daily as needed (Dermatitis).      triamterene -hydrochlorothiazide (MAXZIDE-25) 37.5-25 MG tablet TAKE 0.5-1 TABLETS BY MOUTH DAILY. 90 tablet 3   ALPRAZolam  (XANAX ) 1 MG tablet May take 1 tablet (1 mg total) by mouth 3 (three) times daily as needed. May also take 1 tablet (1 mg total) 3 (three) times daily as needed. 90 tablet 3   HYDROcodone -acetaminophen  (NORCO) 10-325 MG tablet Take 0.5 tablets by mouth every 6 (six) hours as needed. 60 tablet 0   HYDROcodone -acetaminophen  (NORCO) 10-325 MG tablet Take 0.5 tablets by mouth every 6 (six) hours as needed for severe pain (pain score 7-10). 60 tablet 0   HYDROcodone -acetaminophen  (NORCO) 10-325 MG tablet Take 0.5 tablets by mouth every 6 (six) hours as needed. 60 tablet 0   amoxicillin -clavulanate (AUGMENTIN ) 875-125 MG tablet Take 1 tablet by mouth every 12 (twelve) hours. (Patient not taking: Reported on 05/22/2023) 14 tablet 0   azithromycin  (ZITHROMAX ) 250 MG tablet Take 1 tablet (250 mg total) by mouth daily. Take first 2 tablets together, then 1 every day until finished. (Patient not taking: Reported on 05/22/2023) 6 tablet 0   benzonatate  (TESSALON ) 200 MG capsule Take 1 capsule (200 mg total) by mouth 3 (three) times daily as needed. As needed for cough, swallow whole, do not chew (Patient not taking: Reported on 05/22/2023)  21 capsule 0   No facility-administered medications prior to visit.    ROS: Review of Systems  Constitutional:  Negative for activity change, appetite change, chills, fatigue and unexpected weight change.  HENT:  Negative for congestion, mouth sores and sinus pressure.   Eyes:  Negative for visual disturbance.  Respiratory:  Negative for cough and chest tightness.   Gastrointestinal:  Negative for abdominal pain and nausea.  Genitourinary:  Negative for difficulty urinating, frequency and vaginal pain.  Musculoskeletal:  Negative for back pain and gait problem.  Skin:  Negative for pallor and rash.  Neurological:  Negative for dizziness, tremors,  weakness, numbness and headaches.  Psychiatric/Behavioral:  Negative for confusion and sleep disturbance.     Objective:  BP 118/70   Pulse 73   Temp 98.2 F (36.8 C) (Oral)   Ht 5\' 7"  (1.702 m)   Wt (!) 374 lb (169.6 kg)   SpO2 98%   BMI 58.58 kg/m   BP Readings from Last 3 Encounters:  05/22/23 118/70  03/26/23 120/80  03/13/23 (!) 148/95    Wt Readings from Last 3 Encounters:  05/22/23 (!) 374 lb (169.6 kg)  03/26/23 (!) 365 lb (165.6 kg)  03/13/23 (!) 358 lb 14.5 oz (162.8 kg)    Physical Exam Constitutional:      General: She is not in acute distress.    Appearance: She is well-developed. She is obese.  HENT:     Head: Normocephalic.     Right Ear: External ear normal.     Left Ear: External ear normal.     Nose: Nose normal.  Eyes:     General:        Right eye: No discharge.        Left eye: No discharge.     Conjunctiva/sclera: Conjunctivae normal.     Pupils: Pupils are equal, round, and reactive to light.  Neck:     Thyroid : No thyromegaly.     Vascular: No JVD.     Trachea: No tracheal deviation.  Cardiovascular:     Rate and Rhythm: Normal rate and regular rhythm.     Heart sounds: Normal heart sounds.  Pulmonary:     Effort: No respiratory distress.     Breath sounds: No stridor. No wheezing.  Abdominal:     General: Bowel sounds are normal. There is no distension.     Palpations: Abdomen is soft. There is no mass.     Tenderness: There is no abdominal tenderness. There is no guarding or rebound.  Musculoskeletal:        General: No tenderness.     Cervical back: Normal range of motion and neck supple. No rigidity.  Lymphadenopathy:     Cervical: No cervical adenopathy.  Skin:    Findings: No erythema or rash.  Neurological:     Cranial Nerves: No cranial nerve deficit.     Motor: No abnormal muscle tone.     Coordination: Coordination normal.     Deep Tendon Reflexes: Reflexes normal.  Psychiatric:        Behavior: Behavior normal.         Thought Content: Thought content normal.        Judgment: Judgment normal.     Lab Results  Component Value Date   WBC 8.6 03/13/2023   HGB 15.6 (H) 03/13/2023   HCT 47.0 (H) 03/13/2023   PLT 208 03/13/2023   GLUCOSE 112 (H) 03/26/2023   CHOL 171 01/24/2021   TRIG  102.0 01/24/2021   HDL 49.50 01/24/2021   LDLCALC 101 (H) 01/24/2021   ALT 16 03/26/2023   AST 16 03/26/2023   NA 140 03/26/2023   K 3.9 03/26/2023   CL 101 03/26/2023   CREATININE 0.57 03/26/2023   BUN 14 03/26/2023   CO2 32 03/26/2023   TSH 2.16 11/19/2022   INR 1.0 08/09/2007   HGBA1C 6.2 03/26/2023    CT Angio Chest PE W and/or Wo Contrast Result Date: 03/13/2023 CLINICAL DATA:  Cough and shortness of breath EXAM: CT ANGIOGRAPHY CHEST WITH CONTRAST TECHNIQUE: Multidetector CT imaging of the chest was performed using the standard protocol during bolus administration of intravenous contrast. Multiplanar CT image reconstructions and MIPs were obtained to evaluate the vascular anatomy. RADIATION DOSE REDUCTION: This exam was performed according to the departmental dose-optimization program which includes automated exposure control, adjustment of the mA and/or kV according to patient size and/or use of iterative reconstruction technique. CONTRAST:  75mL OMNIPAQUE  IOHEXOL  350 MG/ML SOLN COMPARISON:  Chest x-ray from earlier in the same day. FINDINGS: Cardiovascular: Thoracic aorta and its branches are within normal limits. No aneurysmal dilatation is seen. No significant atherosclerotic calcifications are noted. The pulmonary artery shows a normal branching pattern bilaterally. No intraluminal filling defect to suggest pulmonary embolism is noted. No significant coronary calcifications are noted. Mediastinum/Nodes: Thoracic inlet is within normal limits. No hilar or mediastinal adenopathy is noted. The esophagus as visualized is within normal limits. Lungs/Pleura: Lungs are overall well aerated bilaterally. Mild tree-in-bud  changes are noted in the right lower lobe. This may represent some very early inflammatory change. No effusion is noted. No confluent infiltrate is seen. Upper Abdomen: Gallbladder has been surgically removed. No other focal abnormality is noted. Musculoskeletal: Degenerative changes of the thoracic spine are noted. No acute rib abnormality is noted. Review of the MIP images confirms the above findings. IMPRESSION: No evidence of pulmonary emboli. Mild tree-in-bud changes in the right lower lobe which may represent very early infiltrate. Electronically Signed   By: Violeta Grey M.D.   On: 03/13/2023 23:03   DG Chest 2 View Result Date: 03/13/2023 CLINICAL DATA:  Nonproductive cough and shortness of breath for several days. EXAM: CHEST - 2 VIEW COMPARISON:  None Available. FINDINGS: The heart size and mediastinal contours are within normal limits. Both lungs are clear. Right shoulder prosthesis noted, as well as internal fixation device in the proximal left humerus. IMPRESSION: No active cardiopulmonary disease. Electronically Signed   By: Marlyce Sine M.D.   On: 03/13/2023 14:16    Assessment & Plan:   Problem List Items Addressed This Visit     Chronic anxiety - Primary   Chronic  Cont on Xanax  prn Potential benefits of a long term benzodiazepines  use as well as potential risks  and complications were explained to the patient and were aknowledged. Situational. Continue with Wellbutrin  and Paxil .  Psychology consultation - Seeing Dr Honey Lusty for counseling. Better      Relevant Medications   ALPRAZolam  (XANAX ) 1 MG tablet   Other Relevant Orders   Comprehensive metabolic panel with GFR   PANIC ATTACK   Xanax  prn  Potential benefits of a long term benzodiazepines  use as well as potential risks  and complications were explained to the patient and were aknowledged.      Relevant Medications   ALPRAZolam  (XANAX ) 1 MG tablet   Essential hypertension   Low-salt diet Furosemide        Relevant Orders   TSH  Other Visit Diagnoses       Excessive sweating       Relevant Orders   Comprehensive metabolic panel with GFR   FSH         Meds ordered this encounter  Medications   HYDROcodone -acetaminophen  (NORCO) 10-325 MG tablet    Sig: Take 0.5 tablets by mouth every 6 (six) hours as needed.    Dispense:  60 tablet    Refill:  0    Please fill on or after 07/22/23   HYDROcodone -acetaminophen  (NORCO) 10-325 MG tablet    Sig: Take 0.5 tablets by mouth every 6 (six) hours as needed for severe pain (pain score 7-10).    Dispense:  60 tablet    Refill:  0    Please fill on or after 05/23/23   HYDROcodone -acetaminophen  (NORCO) 10-325 MG tablet    Sig: Take 0.5 tablets by mouth every 6 (six) hours as needed.    Dispense:  60 tablet    Refill:  0    Please fill on or after 06/22/23   ALPRAZolam  (XANAX ) 1 MG tablet    Sig: May take 1 tablet (1 mg total) by mouth 3 (three) times daily as needed. May also take 1 tablet (1 mg total) 3 (three) times daily as needed.    Dispense:  90 tablet    Refill:  3      Follow-up: Return in about 3 months (around 08/22/2023) for a follow-up visit.  Anitra Barn, MD

## 2023-05-22 NOTE — Assessment & Plan Note (Signed)
Low-salt diet Furosemide

## 2023-05-22 NOTE — Assessment & Plan Note (Signed)
 Chronic  Cont on Xanax prn Potential benefits of a long term benzodiazepines  use as well as potential risks  and complications were explained to the patient and were aknowledged. Situational. Continue with Wellbutrin and Paxil.  Psychology consultation - Seeing Dr Bosie Clos for counseling. Better

## 2023-05-23 LAB — FOLLICLE STIMULATING HORMONE: FSH: 10 m[IU]/mL

## 2023-05-26 ENCOUNTER — Ambulatory Visit: Payer: Self-pay | Admitting: Internal Medicine

## 2023-06-24 ENCOUNTER — Other Ambulatory Visit: Payer: Self-pay | Admitting: Internal Medicine

## 2023-06-29 ENCOUNTER — Other Ambulatory Visit: Payer: Self-pay | Admitting: Internal Medicine

## 2023-07-04 ENCOUNTER — Other Ambulatory Visit: Payer: Self-pay | Admitting: Internal Medicine

## 2023-07-21 ENCOUNTER — Other Ambulatory Visit: Payer: Self-pay | Admitting: Internal Medicine

## 2023-07-25 ENCOUNTER — Other Ambulatory Visit: Payer: Self-pay | Admitting: Internal Medicine

## 2023-08-05 ENCOUNTER — Encounter: Payer: Self-pay | Admitting: Cardiology

## 2023-08-05 ENCOUNTER — Ambulatory Visit: Attending: Internal Medicine | Admitting: Cardiology

## 2023-08-05 VITALS — BP 148/58 | HR 100 | Ht 67.0 in | Wt 395.0 lb

## 2023-08-05 DIAGNOSIS — R9431 Abnormal electrocardiogram [ECG] [EKG]: Secondary | ICD-10-CM | POA: Diagnosis not present

## 2023-08-05 DIAGNOSIS — I10 Essential (primary) hypertension: Secondary | ICD-10-CM | POA: Diagnosis not present

## 2023-08-05 DIAGNOSIS — Z8249 Family history of ischemic heart disease and other diseases of the circulatory system: Secondary | ICD-10-CM

## 2023-08-05 DIAGNOSIS — Z9189 Other specified personal risk factors, not elsewhere classified: Secondary | ICD-10-CM

## 2023-08-05 NOTE — Patient Instructions (Signed)
 Medication Instructions:  Your physician recommends that you continue on your current medications as directed. Please refer to the Current Medication list given to you today.  *If you need a refill on your cardiac medications before your next appointment, please call your pharmacy*  Lab Work: NONE If you have labs (blood work) drawn today and your tests are completely normal, you will receive your results only by: MyChart Message (if you have MyChart) OR A paper copy in the mail If you have any lab test that is abnormal or we need to change your treatment, we will call you to review the results.  Testing/Procedures: Echocardiogram Your physician has requested that you have an echocardiogram. Echocardiography is a painless test that uses sound waves to create images of your heart. It provides your doctor with information about the size and shape of your heart and how well your heart's chambers and valves are working. This procedure takes approximately one hour. There are no restrictions for this procedure. Please do NOT wear cologne, perfume, aftershave, or lotions (deodorant is allowed). Please arrive 15 minutes prior to your appointment time.  Please note: We ask at that you not bring children with you during ultrasound (echo/ vascular) testing. Due to room size and safety concerns, children are not allowed in the ultrasound rooms during exams. Our front office staff cannot provide observation of children in our lobby area while testing is being conducted. An adult accompanying a patient to their appointment will only be allowed in the ultrasound room at the discretion of the ultrasound technician under special circumstances. We apologize for any inconvenience.  Coronary Calcium Score Your physician has requested that you have a coronary calcium score performed. This is not covered by insurance and will be an out-of-pocket cost of approximately $99.   Follow-Up: At [facility], you  and your health needs are our priority.  As part of our continuing mission to provide you with exceptional heart care, our providers are all part of one team.  This team includes your primary Cardiologist (physician) and Advanced Practice Providers or APPs (Physician Assistants and Nurse Practitioners) who all work together to provide you with the care you need, when you need it.  Your next appointment:   As needed  Provider:   Shlomo, MD  We recommend signing up for the patient portal called MyChart.  Sign up information is provided on this After Visit Summary.  MyChart is used to connect with patients for Virtual Visits (Telemedicine).  Patients are able to view lab/test results, encounter notes, upcoming appointments, etc.  Non-urgent messages can be sent to your provider as well.   To learn more about what you can do with MyChart, go to [email or web address].

## 2023-08-05 NOTE — Progress Notes (Signed)
 Cardiology CONSULT Note    Date:  08/05/2023   ID:  Stephanie Harper, DOB [birth year], MRN [id number]  PCP:  Garald Karlynn GAILS, MD  Cardiologist:  Wilbert Bihari, MD   Chief Complaint  Patient presents with   New Patient (Initial Visit)    Cardiac evaluation for cardiac risk factors    Patient Profile: Stephanie Harper is a 53 y.o. female who is being seen today for the evaluation of cardiac risk factors for CAD at the request of Rivard, Nena, MD.  History of Present Illness:  Stephanie Harper is a 53 y.o. female who is being seen today for the evaluation cardiac risk factors at the request of Rivard, Nena, MD.  This is a 53 year old female with a history of anxiety, asthma, depression, hypertension, obesity status post lap band surgery in 2014.  She was recently seen by Dr. Darcel her GYN.  She was wanting to start HRT therapy to Dr. Darcel referred her to cardiology to evaluate CVD risk and recommendations going forward for HRT.  She denies any chest pain or pressure, shortness of breath, DOE(except walking long distance when hot), PND, orthopnea, dizziness, palpitations or syncope.  She has intermittent LLE edema mainly with dietary indiscretion with Na.   Past Medical History:  Diagnosis Date   Anal fissure 2012   Dr Teressa   Anxiety    Arthritis    Asthma    COLD WEATHER RELATED, bronchitis   Baker's cyst of knee    Right knee   Complication of anesthesia    PANIC ATTACK RIGHT BEFORE SHOULDER REPLACMENT SURGERY AT [facility]; PT STATES CLAUSTROPHOBIA AND DOES NOT WANT TO BE AWARE OF BEING STRAPPED DOWN IN OR   Depression    History of kidney stones    HTN (hypertension)    after having lap band surgery she was taken off medications   LBP (low back pain)    Obesity    Osteomyelitis (HCC)    at [facility]   Pain    HX OF BILATERAL SHOULDER SURGERIES-CHRONIC SHOULDER PAIN ;  HX OF BILATERAL KNEE PAIN   Sepsis(995.91) 2005   From shoulder replacement surgery   Shoulder  fracture, left     Past Surgical History:  Procedure Laterality Date   APPENDECTOMY     age 49   BREATH 16 H PYLORI  07/15/2011   Procedure: BREATH TEK H PYLORI;  Surgeon: Donnice KATHEE Lunger, MD;  Location: [facility];  Service: General;  Laterality: N/A;   CHOLECYSTECTOMY  1997   CYSTOSCOPY/URETEROSCOPY/HOLMIUM LASER/STENT PLACEMENT Bilateral 04/16/2022   Procedure: CYSTOSCOPY BILATERAL DIAGNOSTIC URETEROSCOPY, BILATERAL RETROGRADE PYELOGRAM, AND BILATERAL URETERAL STENT PLACEMENT;  Surgeon: Elisabeth Valli BIRCH, MD;  Location: [facility];  Service: Urology;  Laterality: Bilateral;  90 MINUTES   CYSTOSCOPY/URETEROSCOPY/HOLMIUM LASER/STENT PLACEMENT Bilateral 05/07/2022   Procedure: DIAGNOSTIC RIGHT URETEROSCOPY WITH STENT EXCHANGE, DIAGNOSTIC LEFT URETEROSCOPY WITH BIOPSY, HOLMIUM LASER LITHOTRIPSY  AND STENT EXCHANGE;  Surgeon: Elisabeth Valli BIRCH, MD;  Location: [facility];  Service: Urology;  Laterality: Bilateral;  90 MINUTES   FRACTURE SURGERY Left 2009   left shoulder   LAPAROSCOPIC GASTRIC BANDING  01/21/2012   Procedure: LAPAROSCOPIC GASTRIC BANDING;  Surgeon: Donnice KATHEE Lunger, MD;  Location: [facility];  Service: General;  Laterality: N/A;   LASER ABLATION  2006   MESH APPLIED TO LAP PORT  01/21/2012   Procedure: MESH APPLIED TO LAP PORT;  Surgeon: Donnice KATHEE Lunger, MD;  Location: [facility];  Service: General;;  PLANTAR FASCIA RELEASE Bilateral 01/07/1994   PORT-A-CATH REMOVAL N/A 02/03/2023   Procedure: SUBCUTANEOUS REMOVAL PORT-A-CATH;  Surgeon: Stevie Herlene Righter, MD;  Location: [facility];  Service: General;  Laterality: N/A;   SHOULDER ARTHROSCOPY Right 07/27/2019   Procedure: RIGHT SHOUDLER ARTHROSCOPY, TISSUE SAMPLE;  Surgeon: Addie Cordella Hamilton, MD;  Location: [facility];  Service: Orthopedics;  Laterality: Right;   SHOULDER ARTHROSCOPY WITH OPEN ROTATOR CUFF REPAIR AND DISTAL CLAVICLE ACROMINECTOMY Left 2004   TONSILLECTOMY     at age 39   TOTAL SHOULDER REPLACEMENT Right 2004   redo open  05/2002   TUBAL LIGATION  2006   UTERINE ABLATION WAS DONE AT THE TIME OF TUBAL LIGATION    Current Medications: Current Meds  Medication Sig   albuterol  (VENTOLIN  HFA) 108 (90 Base) MCG/ACT inhaler INHALE 2 PUFFS BY MOUTH EVERY 6 HOURS AS NEEDED   ALPRAZolam  (XANAX ) 1 MG tablet May take 1 tablet (1 mg total) by mouth 3 (three) times daily as needed. May also take 1 tablet (1 mg total) 3 (three) times daily as needed.   buPROPion  ER (WELLBUTRIN  SR) 100 MG 12 hr tablet Take 1 tablet (100 mg total) by mouth 2 (two) times daily.   escitalopram  (LEXAPRO ) 20 MG tablet TAKE 1 TABLET BY MOUTH DAILY AT BEDTIME   furosemide  (LASIX ) 40 MG tablet Take 1 tablet (40 mg total) by mouth daily as needed.   HYDROcodone -acetaminophen  (NORCO) 10-325 MG tablet Take 0.5 tablets by mouth every 6 (six) hours as needed.   HYDROcodone -acetaminophen  (NORCO) 10-325 MG tablet Take 0.5 tablets by mouth every 6 (six) hours as needed for severe pain (pain score 7-10).   HYDROcodone -acetaminophen  (NORCO) 10-325 MG tablet Take 0.5 tablets by mouth every 6 (six) hours as needed.   ibuprofen  (ADVIL ) 600 MG tablet TAKE 1 TABLET (600 MG TOTAL) BY MOUTH 2 (TWO) TIMES DAILY AS NEEDED.   Melatonin 10 MG CAPS Take 10 mg by mouth at bedtime as needed (Sleep).   Multiple Vitamin (MULTIVITAMIN) tablet Take 1 tablet by mouth in the morning.   niacinamide 500 MG tablet Take 500 mg by mouth 2 (two) times daily.    pentoxifylline (TRENTAL) 400 MG CR tablet Take 400 mg by mouth in the morning and at bedtime.   triamterene -hydrochlorothiazide (MAXZIDE-25) 37.5-25 MG tablet TAKE 0.5-1 TABLETS BY MOUTH DAILY.    Allergies:   Adhesive [tape]   Social History   Socioeconomic History   Marital status: Married    Spouse name: Not on file   Number of children: Not on file   Years of education: Not on file   Highest education level: Some college, no degree  Occupational History   Occupation: Engineer, site: UNEMPLOYED  Tobacco Use    Smoking status: Every Day    Current packs/day: 1.00    Average packs/day: 1 pack/day for 43.0 years (43.0 ttl pk-yrs)    Types: Cigarettes    Start date: 08/12/1980    Passive exposure: Past   Smokeless tobacco: Never   Tobacco comments:    WAS SMOKING 2 PPD-HAS CUT BACK TO 1 PPD OR LESS  Vaping Use   Vaping status: Never Used  Substance and Sexual Activity   Alcohol use: No   Drug use: No   Sexual activity: Yes  Other Topics Concern   Not on file  Social History Narrative   Not on file   Social Drivers of Health   Financial Resource Strain: Low Risk  (03/25/2023)   Overall  Financial Resource Strain (CARDIA)    Difficulty of Paying Living Expenses: Not hard at all  Food Insecurity: No Food Insecurity (03/25/2023)   Hunger Vital Sign    Worried About Running Out of Food in the Last Year: Never true    Ran Out of Food in the Last Year: Never true  Transportation Needs: No Transportation Needs (03/25/2023)   PRAPARE - Administrator, Civil Service (Medical): No    Lack of Transportation (Non-Medical): No  Physical Activity: Unknown (03/25/2023)   Exercise Vital Sign    Days of Exercise per Week: 0 days    Minutes of Exercise per Session: Not on file  Stress: Stress Concern Present (03/25/2023)   [employer] of Occupational Health - Occupational Stress Questionnaire    Feeling of Stress : Rather much  Social Connections: Moderately Isolated (03/25/2023)   Social Connection and Isolation Panel    Frequency of Communication with Friends and Family: More than three times a week    Frequency of Social Gatherings with Friends and Family: Three times a week    Attends Religious Services: Never    Active Member of Clubs or Organizations: No    Attends Engineer, structural: Not on file    Marital Status: Married     Family History:  The patient's family history includes Arthritis in her father and mother; Bipolar disorder in her brother; Coronary artery  disease in an other family member; Diabetes in her father and mother; Heart disease (age of onset: 18) in her father; Hyperlipidemia in her father, mother, and another family member; Hypertension in her father, mother, and another family member.   ROS:   Please see the history of present illness.    ROS All other systems reviewed and are negative.      No data to display             PHYSICAL EXAM:   VS:  BP (!) 148/58   Pulse 100   Ht 5' 7 (1.702 m)   Wt (!) 395 lb (179.2 kg)   SpO2 98%   BMI 61.87 kg/m    GEN: Well nourished, well developed, in no acute distress  HEENT: normal  Neck: no JVD, carotid bruits, or masses Cardiac: RRR; no murmurs, rubs, or gallops,  Intact distal pulses bilaterally.  1=2+ LLE edema with weeping Respiratory:  clear to auscultation bilaterally, normal work of breathing GI: soft, nontender, nondistended, + BS MS: no deformity or atrophy  Skin: warm and dry, no rash Neuro:  Alert and Oriented x 3, Strength and sensation are intact Psych: euthymic mood, full affect  Wt Readings from Last 3 Encounters:  08/05/23 (!) 395 lb (179.2 kg)  05/22/23 (!) 374 lb (169.6 kg)  03/26/23 (!) 365 lb (165.6 kg)      Studies/Labs Reviewed:   EKG Interpretation Date/Time:  Tuesday August 05 2023 14:17:53 EDT Ventricular Rate:  100 PR Interval:  164 QRS Duration:  94 QT Interval:  362 QTC Calculation: 466 R Axis:   -31  Text Interpretation: Normal sinus rhythm Left axis deviation Incomplete right bundle branch block Cannot rule out Anterior infarct , age undetermined When compared with ECG of 10-Apr-2022 13:42, Minimal criteria for Anterior infarct are now Present Confirmed by Shlomo Corning [phone]) on 08/05/2023 2:35:50 PMEKG Interpretation Date/Time:  Tuesday August 05 2023 14:17:53 EDT Ventricular Rate:  100 PR Interval:  164 QRS Duration:  94 QT Interval:  362 QTC Calculation: 466 R Axis:   -  31  Text Interpretation: Normal sinus rhythm Left axis  deviation Incomplete right bundle branch block Cannot rule out Anterior infarct , age undetermined When compared with ECG of 10-Apr-2022 13:42, Minimal criteria for Anterior infarct are now Present Confirmed by Shlomo Corning [phone]) on 08/05/2023 2:35:50 PM        Recent Labs: 03/13/2023: Hemoglobin 15.6; Platelets 208 05/22/2023: ALT 23; BUN 12; Creatinine, Ser 0.54; Potassium 3.5; Sodium 137; TSH 4.01   Lipid Panel    Component Value Date/Time   CHOL 171 01/24/2021 1517   TRIG 102.0 01/24/2021 1517   HDL 49.50 01/24/2021 1517   CHOLHDL 3 01/24/2021 1517   VLDL 20.4 01/24/2021 1517   LDLCALC 101 (H) 01/24/2021 1517   LDLCALC 109 (H) 10/06/2017 1109    The additional studies/ records that were reviewed today include:  Office notes from Dr. Regard    ASSESSMENT:    1. Family history of coronary artery disease   2. Multiple risk factors for coronary artery disease   3. Essential hypertension      PLAN:  In order of problems listed above:  #Family history of premature CAD #Cardiac risk factors - She does have a family history of CAD (Father had MI in his 45's) - She is a smoker for 42 pack years but recently quit - She has a history of morbid obesity status post lap band procedure as well as hypertension - EKG is nonischemic - Recommend coronary calcium score to assess future cardiac risk  #Hypertension - BP is borderline controlled on exam today - Continue Maxide 37.05-2203 mg 1/2 to 1 tablet daily  #Incomplete RBBB -check 2D echo  Time Spent: 20 minutes total time of encounter, including 15 minutes spent in face-to-face patient care on the date of this encounter. This time includes coordination of care and counseling regarding above mentioned problem list. Remainder of non-face-to-face time involved reviewing chart documents/testing relevant to the patient encounter and documentation in the medical record. I have independently reviewed documentation from referring  provider  Followup:  PRN  Medication Adjustments/Labs and Tests Ordered: Current medicines are reviewed at length with the patient today.  Concerns regarding medicines are outlined above.  Medication changes, Labs and Tests ordered today are listed in the Patient Instructions below.  There are no Patient Instructions on file for this visit.   Signed, Corning Shlomo, MD  08/05/2023 2:39 PM    [facility] [address] [place], [place], [place]  [address] Phone: [phone]; Fax: [phone]

## 2023-08-05 NOTE — Addendum Note (Signed)
 Addended by: Lyndzee Kliebert N on: 08/05/2023 02:47 PM   Modules accepted: Orders

## 2023-08-06 ENCOUNTER — Ambulatory Visit (HOSPITAL_BASED_OUTPATIENT_CLINIC_OR_DEPARTMENT_OTHER)
Admission: RE | Admit: 2023-08-06 | Discharge: 2023-08-06 | Disposition: A | Discharge status: Home / Self Care | Payer: Self-pay | Source: Ambulatory Visit | Attending: Cardiology | Admitting: Cardiology

## 2023-08-06 ENCOUNTER — Ambulatory Visit (INDEPENDENT_AMBULATORY_CARE_PROVIDER_SITE_OTHER)

## 2023-08-06 DIAGNOSIS — Z9189 Other specified personal risk factors, not elsewhere classified: Secondary | ICD-10-CM | POA: Insufficient documentation

## 2023-08-06 DIAGNOSIS — R9431 Abnormal electrocardiogram [ECG] [EKG]: Secondary | ICD-10-CM | POA: Diagnosis not present

## 2023-08-06 LAB — ECHOCARDIOGRAM COMPLETE
AR max vel: 2.06 cm2
AV Area VTI: 2.06 cm2
AV Area mean vel: 2.09 cm2
AV Mean grad: 5 mmHg
AV Peak grad: 10 mmHg
Ao pk vel: 1.58 m/s
Area-P 1/2: 4.39 cm2
S' Lateral: 2.57 cm

## 2023-08-06 MED ORDER — PERFLUTREN LIPID MICROSPHERE
1.0000 mL | INTRAVENOUS | Status: AC | PRN
  Administered 2023-08-06: 4 mL via INTRAVENOUS

## 2023-08-07 ENCOUNTER — Ambulatory Visit: Payer: Self-pay | Admitting: Cardiology

## 2023-08-09 ENCOUNTER — Encounter: Payer: Self-pay | Admitting: Internal Medicine

## 2023-08-14 ENCOUNTER — Other Ambulatory Visit: Payer: Self-pay | Admitting: Internal Medicine

## 2023-08-25 ENCOUNTER — Ambulatory Visit (INDEPENDENT_AMBULATORY_CARE_PROVIDER_SITE_OTHER): Admitting: Internal Medicine

## 2023-08-25 ENCOUNTER — Encounter: Payer: Self-pay | Admitting: Internal Medicine

## 2023-08-25 VITALS — BP 135/81 | HR 94 | Temp 97.8°F | Ht 67.0 in | Wt 383.0 lb

## 2023-08-25 DIAGNOSIS — I10 Essential (primary) hypertension: Secondary | ICD-10-CM | POA: Diagnosis not present

## 2023-08-25 DIAGNOSIS — M5440 Lumbago with sciatica, unspecified side: Secondary | ICD-10-CM | POA: Diagnosis not present

## 2023-08-25 DIAGNOSIS — R7303 Prediabetes: Secondary | ICD-10-CM | POA: Insufficient documentation

## 2023-08-25 DIAGNOSIS — E288 Other ovarian dysfunction: Secondary | ICD-10-CM | POA: Insufficient documentation

## 2023-08-25 DIAGNOSIS — R739 Hyperglycemia, unspecified: Secondary | ICD-10-CM | POA: Diagnosis not present

## 2023-08-25 DIAGNOSIS — Z9884 Bariatric surgery status: Secondary | ICD-10-CM | POA: Insufficient documentation

## 2023-08-25 DIAGNOSIS — G8929 Other chronic pain: Secondary | ICD-10-CM | POA: Diagnosis not present

## 2023-08-25 MED ORDER — HYDROCODONE-ACETAMINOPHEN 10-325 MG PO TABS: 0.5000 | ORAL_TABLET | Freq: Four times a day (QID) | ORAL | 0 refills | Status: DC | PRN

## 2023-08-25 NOTE — Assessment & Plan Note (Signed)
Low-salt diet Furosemide

## 2023-08-25 NOTE — Assessment & Plan Note (Signed)
 Chronic Cont on Norco prn - 10/325 0.5 tab QID  Potential benefits of a long term opioids use as well as potential risks (i.e. addiction risk, apnea etc) and complications (i.e. Somnolence, constipation and others) were explained to the patient and were aknowledged. Blue-Emu cream was recommended to use 2-3 times a day

## 2023-08-25 NOTE — Progress Notes (Signed)
 Subjective:  Patient ID: Stephanie Harper, female    DOB: 12-Nov-1970  Age: 52 y.o. MRN: [id number]  CC: Medical Management of Chronic Issues (3 mnth f/u )   HPI Stephanie Harper presents for LBP, PCOS, pre- DM Started Wegovy  - seeing Dr Tommas  Outpatient Medications Prior to Visit  Medication Sig Dispense Refill   albuterol  (VENTOLIN  HFA) 108 (90 Base) MCG/ACT inhaler INHALE 2 PUFFS BY MOUTH EVERY 6 HOURS AS NEEDED 6.7 each 3   ALPRAZolam  (XANAX ) 1 MG tablet May take 1 tablet (1 mg total) by mouth 3 (three) times daily as needed. May also take 1 tablet (1 mg total) 3 (three) times daily as needed. 90 tablet 3   buPROPion  ER (WELLBUTRIN  SR) 100 MG 12 hr tablet TAKE 1 TABLET BY MOUTH TWICE A DAY 180 tablet 2   escitalopram  (LEXAPRO ) 20 MG tablet TAKE 1 TABLET BY MOUTH DAILY AT BEDTIME 90 tablet 1   furosemide  (LASIX ) 40 MG tablet Take 1 tablet (40 mg total) by mouth daily as needed. 90 tablet 3   ibuprofen  (ADVIL ) 600 MG tablet TAKE 1 TABLET (600 MG TOTAL) BY MOUTH 2 (TWO) TIMES DAILY AS NEEDED. 180 tablet 1   Melatonin 10 MG CAPS Take 10 mg by mouth at bedtime as needed (Sleep).     metFORMIN (GLUCOPHAGE-XR) 500 MG 24 hr tablet 1 tablet with evening meal Orally Once a day; Duration: 30 day(s)     Multiple Vitamin (MULTIVITAMIN) tablet Take 1 tablet by mouth in the morning.     niacinamide 500 MG tablet Take 500 mg by mouth 2 (two) times daily.      pentoxifylline (TRENTAL) 400 MG CR tablet Take 400 mg by mouth in the morning and at bedtime.     triamterene -hydrochlorothiazide (MAXZIDE-25) 37.5-25 MG tablet TAKE 0.5-1 TABLETS BY MOUTH DAILY. 90 tablet 3   WEGOVY  0.25 MG/0.5ML SOAJ SQ injection 0.5 mL Subcutaneous once a week; Duration: 28 days     HYDROcodone -acetaminophen  (NORCO) 10-325 MG tablet Take 0.5 tablets by mouth every 6 (six) hours as needed. 60 tablet 0   HYDROcodone -acetaminophen  (NORCO) 10-325 MG tablet Take 0.5 tablets by mouth every 6 (six) hours as needed for severe pain  (pain score 7-10). 60 tablet 0   HYDROcodone -acetaminophen  (NORCO) 10-325 MG tablet Take 0.5 tablets by mouth every 6 (six) hours as needed. 60 tablet 0   Cholecalciferol  (VITAMIN D3) 50 MCG (2000 UT) capsule Take 1 capsule (2,000 Units total) by mouth daily. (Patient not taking: Reported on 08/25/2023) 100 capsule 3   meloxicam  (MOBIC ) 15 MG tablet TAKE 1 TABLET (15 MG TOTAL) BY MOUTH DAILY. (Patient not taking: Reported on 08/25/2023) 30 tablet 3   pantoprazole  (PROTONIX ) 40 MG tablet Oral; Duration: 30 Days     triamcinolone  ointment (KENALOG ) 0.1 % Apply 1 Application topically daily as needed (Dermatitis). (Patient not taking: Reported on 08/25/2023)     No facility-administered medications prior to visit.    ROS: Review of Systems  Constitutional:  Negative for activity change, appetite change, chills, fatigue and unexpected weight change.  HENT:  Negative for congestion, mouth sores and sinus pressure.   Eyes:  Negative for visual disturbance.  Respiratory:  Negative for cough and chest tightness.   Gastrointestinal:  Negative for abdominal pain and nausea.  Genitourinary:  Negative for difficulty urinating, frequency and vaginal pain.  Musculoskeletal:  Positive for arthralgias, back pain and gait problem.  Skin:  Negative for pallor and rash.  Neurological:  Negative for  dizziness, tremors, weakness, numbness and headaches.  Psychiatric/Behavioral:  Negative for confusion, sleep disturbance and suicidal ideas.     Objective:  BP 135/81   Pulse 94   Temp 97.8 F (36.6 C) (Oral)   Ht 5' 7 (1.702 m)   Wt (!) 383 lb (173.7 kg)   SpO2 97%   BMI 59.99 kg/m   BP Readings from Last 3 Encounters:  08/25/23 135/81  08/05/23 (!) 148/58  05/22/23 118/70    Wt Readings from Last 3 Encounters:  08/25/23 (!) 383 lb (173.7 kg)  08/05/23 (!) 395 lb (179.2 kg)  05/22/23 (!) 374 lb (169.6 kg)    Physical Exam Constitutional:      General: She is not in acute distress.     Appearance: She is well-developed. She is obese.  HENT:     Head: Normocephalic.     Right Ear: External ear normal.     Left Ear: External ear normal.     Nose: Nose normal.  Eyes:     General:        Right eye: No discharge.        Left eye: No discharge.     Conjunctiva/sclera: Conjunctivae normal.     Pupils: Pupils are equal, round, and reactive to light.  Neck:     Thyroid : No thyromegaly.     Vascular: No JVD.     Trachea: No tracheal deviation.  Cardiovascular:     Rate and Rhythm: Normal rate and regular rhythm.     Heart sounds: Normal heart sounds.  Pulmonary:     Effort: No respiratory distress.     Breath sounds: No stridor. No wheezing.  Abdominal:     General: Bowel sounds are normal. There is no distension.     Palpations: Abdomen is soft. There is no mass.     Tenderness: There is no abdominal tenderness. There is no guarding or rebound.  Musculoskeletal:        General: Tenderness present.     Cervical back: Normal range of motion and neck supple. No rigidity.  Lymphadenopathy:     Cervical: No cervical adenopathy.  Skin:    Findings: No erythema or rash.  Neurological:     Mental Status: She is oriented to person, place, and time.     Cranial Nerves: No cranial nerve deficit.     Motor: No abnormal muscle tone.     Coordination: Coordination normal.     Deep Tendon Reflexes: Reflexes normal.  Psychiatric:        Behavior: Behavior normal.        Thought Content: Thought content normal.        Judgment: Judgment normal.     Lab Results  Component Value Date   WBC 8.6 03/13/2023   HGB 15.6 (H) 03/13/2023   HCT 47.0 (H) 03/13/2023   PLT 208 03/13/2023   GLUCOSE 87 05/22/2023   CHOL 171 01/24/2021   TRIG 102.0 01/24/2021   HDL 49.50 01/24/2021   LDLCALC 101 (H) 01/24/2021   ALT 23 05/22/2023   AST 20 05/22/2023   NA 137 05/22/2023   K 3.5 05/22/2023   CL 98 05/22/2023   CREATININE 0.54 05/22/2023   BUN 12 05/22/2023   CO2 31 05/22/2023    TSH 4.01 05/22/2023   INR 1.0 08/09/2007   HGBA1C 6.2 03/26/2023    CT CARDIAC SCORING (SELF PAY ONLY) Addendum Date: 08/12/2023 ADDENDUM REPORT: 08/12/2023 17:31 EXAM: OVER-READ INTERPRETATION  CT CHEST The following  report is an over-read performed by radiologist Dr. Lynwood Seip [facility] on 08/12/2023. This over-read does not include interpretation of cardiac or coronary anatomy or pathology. The coronary calcium score interpretation by the cardiologist is attached. COMPARISON:  March 13, 2023. FINDINGS: No definite abnormality seen involving the visualized portions of the extracardiac vascular structures. Visualized mediastinum is unremarkable. Visualized portion of upper abdomen is unremarkable. Visualized pulmonary parenchyma is unremarkable. Visualized skeleton is unremarkable. IMPRESSION: No definite abnormality seen involving the visualized portions of the extracardiac structures of the chest. Electronically Signed   By: Lynwood Seip Raddle M.D.   On: 08/12/2023 17:31   Result Date: 08/12/2023 CLINICAL DATA:  Cardiovascular Disease Risk stratification EXAM: Coronary Calcium Score MEDICATIONS: MEDICATIONS None TECHNIQUE: A gated, non-contrast computed tomography scan of the heart was performed using 3mm slice thickness. Axial images were analyzed on a dedicated workstation. Calcium scoring of the coronary arteries was performed using the Agatston method. FINDINGS: Coronary arteries: Normal origins. Coronary Calcium Score: Left main: 0 Left anterior descending artery: 0 Left circumflex artery: 0 Right coronary artery: 0 Total: 0 Pericardium: Normal. Ascending Aorta: Borderline dilated 38 mm Non-cardiac: See separate report from [facility]. IMPRESSION: Coronary calcium score of 0 Agatston units. This suggests low risk for future cardiac events. RECOMMENDATIONS: Coronary artery calcium (CAC) score is a strong predictor of incident coronary heart disease (CHD) and provides predictive  information beyond traditional risk factors. CAC scoring is reasonable to use in the decision to withhold, postpone, or initiate statin therapy in intermediate-risk or selected borderline-risk asymptomatic adults (age 20-75 years and LDL-C >=70 to <190 mg/dL) who do not have diabetes or established atherosclerotic cardiovascular disease (ASCVD).* In intermediate-risk (10-year ASCVD risk >=7.5% to <20%) adults or selected borderline-risk (10-year ASCVD risk >=5% to <7.5%) adults in whom a CAC score is measured for the purpose of making a treatment decision the following recommendations have been made: If CAC=0, it is reasonable to withhold statin therapy and reassess in 5 to 10 years, as long as higher risk conditions are absent (diabetes mellitus, family history of premature CHD in first degree relatives (males <55 years; females <65 years), cigarette smoking, or LDL >=190 mg/dL). If CAC is 1 to 99, it is reasonable to initiate statin therapy for patients >=41 years of age. If CAC is >=100 or >=75th percentile, it is reasonable to initiate statin therapy at any age. Cardiology referral should be considered for patients with CAC scores >=400 or >=75th percentile. *2018 AHA/ACC/AACVPR/AAPA/ABC/ACPM/ADA/AGS/APhA/ASPC/NLA/PCNA Guideline on the Management of Blood Cholesterol: A Report of the American College of Cardiology/American Heart Association Task Force on Clinical Practice Guidelines. J Am Coll Cardiol. 2019;73(24):3168-3209. Ezra Shuck, MD Electronically Signed: By: Ezra Shuck M.D. On: 08/12/2023 17:16   ECHOCARDIOGRAM COMPLETE Result Date: 08/06/2023    ECHOCARDIOGRAM REPORT   Patient Name:   Stephanie Harper Date of Exam: 08/06/2023 Medical Rec #:  [id number]          Height:       67.0 in Accession #:    [id number]         Weight:       395.0 lb Date of Birth:  [birth year]           BSA:          2.701 m Patient Age:    53 years           BP:           148/58 mmHg Patient Gender: F  HR:           84 bpm. Exam Location:  Outpatient Procedure: 2D Echo, Cardiac Doppler, Color Doppler and Intracardiac            Opacification Agent (Both Spectral and Color Flow Doppler were            utilized during procedure). Indications:    R06.9 DOE; R60.0 Lower extremity edema  History:        Patient has prior history of Echocardiogram examinations, most                 recent 02/14/2022. Signs/Symptoms:Dyspnea and Edema; Risk                 Factors:Hypertension, Current Smoker and Family History of                 Coronary Artery Disease. Patient denies chest pain. She does                 have DOE with bilateral leg edema.  Sonographer:    Annabella Cater RVT, RDCS (AE), RDMS Referring Phys: [id number] TRACI R TURNER IMPRESSIONS  1. Left ventricular ejection fraction, by estimation, is 60 to 65%. The left ventricle has normal function. The left ventricle has no regional wall motion abnormalities. There is mild left ventricular hypertrophy. Left ventricular diastolic parameters were normal.  2. Right ventricular systolic function is normal. The right ventricular size is normal. There is normal pulmonary artery systolic pressure. The estimated right ventricular systolic pressure is 32.8 mmHg.  3. The mitral valve is normal in structure. Trivial mitral valve regurgitation. No evidence of mitral stenosis.  4. The aortic valve was not well visualized. Aortic valve regurgitation is not visualized. No aortic stenosis is present.  5. The inferior vena cava is dilated in size with >50% respiratory variability, suggesting right atrial pressure of 8 mmHg. Comparison(s): EF 60%. FINDINGS  Left Ventricle: Left ventricular ejection fraction, by estimation, is 60 to 65%. The left ventricle has normal function. The left ventricle has no regional wall motion abnormalities. Definity  contrast agent was given IV to delineate the left ventricular  endocardial borders. The left ventricular internal cavity size was normal in size. There  is mild left ventricular hypertrophy. Left ventricular diastolic parameters were normal. Right Ventricle: The right ventricular size is normal. No increase in right ventricular wall thickness. Right ventricular systolic function is normal. There is normal pulmonary artery systolic pressure. The tricuspid regurgitant velocity is 2.49 m/s, and  with an assumed right atrial pressure of 8 mmHg, the estimated right ventricular systolic pressure is 32.8 mmHg. Left Atrium: Left atrial size was normal in size. Right Atrium: Right atrial size was normal in size. Pericardium: There is no evidence of pericardial effusion. Mitral Valve: The mitral valve is normal in structure. Trivial mitral valve regurgitation. No evidence of mitral valve stenosis. Tricuspid Valve: The tricuspid valve is normal in structure. Tricuspid valve regurgitation is trivial. Aortic Valve: The aortic valve was not well visualized. Aortic valve regurgitation is not visualized. No aortic stenosis is present. Aortic valve mean gradient measures 5.0 mmHg. Aortic valve peak gradient measures 10.0 mmHg. Aortic valve area, by VTI measures 2.06 cm. Pulmonic Valve: The pulmonic valve was not well visualized. Pulmonic valve regurgitation is trivial. Aorta: The aortic root and ascending aorta are structurally normal, with no evidence of dilitation. Venous: The inferior vena cava is dilated in size with greater than 50% respiratory variability, suggesting right atrial pressure of 8  mmHg. IAS/Shunts: The interatrial septum was not well visualized.  LEFT VENTRICLE PLAX 2D LVIDd:         4.21 cm   Diastology LVIDs:         2.57 cm   LV e' medial:    8.55 cm/s LV PW:         1.22 cm   LV E/e' medial:  11.5 LV IVS:        1.36 cm   LV e' lateral:   7.20 cm/s LVOT diam:     1.75 cm   LV E/e' lateral: 13.6 LV SV:         64 LV SV Index:   24 LVOT Area:     2.41 cm  RIGHT VENTRICLE RV S prime:     15.70 cm/s TAPSE (M-mode): 2.8 cm LEFT ATRIUM             Index         RIGHT ATRIUM           Index LA diam:        3.90 cm 1.44 cm/m   RA Area:     15.30 cm LA Vol (A2C):   70.2 ml 25.99 ml/m  RA Volume:   45.20 ml  16.74 ml/m LA Vol (A4C):   63.5 ml 23.51 ml/m LA Biplane Vol: 71.5 ml 26.48 ml/m  AORTIC VALVE                     PULMONIC VALVE AV Area (Vmax):    2.06 cm      PV Vmax:       1.37 m/s AV Area (Vmean):   2.09 cm      PV Peak grad:  7.5 mmHg AV Area (VTI):     2.06 cm AV Vmax:           158.00 cm/s AV Vmean:          111.000 cm/s AV VTI:            0.312 m AV Peak Grad:      10.0 mmHg AV Mean Grad:      5.0 mmHg LVOT Vmax:         135.00 cm/s LVOT Vmean:        96.300 cm/s LVOT VTI:          0.267 m LVOT/AV VTI ratio: 0.86  AORTA Ao Root diam: 3.50 cm Ao Asc diam:  3.60 cm Ao Arch diam: 3.3 cm MITRAL VALVE                TRICUSPID VALVE MV Area (PHT): 4.39 cm     TR Peak grad:   24.8 mmHg MV Decel Time: 173 msec     TR Vmax:        249.00 cm/s MV E velocity: 98.10 cm/s MV A velocity: 108.00 cm/s  SHUNTS MV E/A ratio:  0.91         Systemic VTI:  0.27 m                             Systemic Diam: 1.75 cm Lonni Nanas MD Electronically signed by Lonni Nanas MD Signature Date/Time: 08/06/2023/10:28:40 PM    Final     Assessment & Plan:   Problem List Items Addressed This Visit     Essential hypertension - Primary   Low-salt diet Furosemide       Hyperandrogenism  Started Wegovy , Metformin - seeing Dr Tommas      Hyperglycemia   Started Wegovy  - seeing Dr Tommas      LOW BACK PAIN   Chronic Cont on Norco prn - 10/325 0.5 tab QID  Potential benefits of a long term opioids use as well as potential risks (i.e. addiction risk, apnea etc) and complications (i.e. Somnolence, constipation and others) were explained to the patient and were aknowledged. Blue-Emu cream was recommended to use 2-3 times a day      Relevant Medications   HYDROcodone -acetaminophen  (NORCO) 10-325 MG tablet   HYDROcodone -acetaminophen  (NORCO) 10-325 MG  tablet   HYDROcodone -acetaminophen  (NORCO) 10-325 MG tablet      Meds ordered this encounter  Medications   HYDROcodone -acetaminophen  (NORCO) 10-325 MG tablet    Sig: Take 0.5 tablets by mouth every 6 (six) hours as needed.    Dispense:  60 tablet    Refill:  0    Please fill on or after 08/22/23   HYDROcodone -acetaminophen  (NORCO) 10-325 MG tablet    Sig: Take 0.5 tablets by mouth every 6 (six) hours as needed for severe pain (pain score 7-10).    Dispense:  60 tablet    Refill:  0    Please fill on or after 09/21/23   HYDROcodone -acetaminophen  (NORCO) 10-325 MG tablet    Sig: Take 0.5 tablets by mouth every 6 (six) hours as needed.    Dispense:  60 tablet    Refill:  0    Please fill on or after 10/21/23      Follow-up: Return for a follow-up visit.  Marolyn Noel, MD

## 2023-08-25 NOTE — Assessment & Plan Note (Signed)
 Started Wegovy , Metformin - seeing Dr Balan

## 2023-08-25 NOTE — Assessment & Plan Note (Signed)
 Started Wegovy  - seeing Dr Tommas

## 2023-09-24 ENCOUNTER — Other Ambulatory Visit: Payer: Self-pay | Admitting: Endocrinology

## 2023-09-24 ENCOUNTER — Encounter: Payer: Self-pay | Admitting: Endocrinology

## 2023-09-24 DIAGNOSIS — E288 Other ovarian dysfunction: Secondary | ICD-10-CM

## 2023-10-01 ENCOUNTER — Other Ambulatory Visit: Payer: Self-pay | Admitting: Endocrinology

## 2023-10-01 ENCOUNTER — Inpatient Hospital Stay
Admission: RE | Admit: 2023-10-01 | Discharge: 2023-10-01 | Disposition: A | Discharge status: Home / Self Care | Source: Ambulatory Visit | Attending: Endocrinology | Admitting: Endocrinology

## 2023-10-01 DIAGNOSIS — E288 Other ovarian dysfunction: Secondary | ICD-10-CM

## 2023-10-14 ENCOUNTER — Other Ambulatory Visit: Payer: Self-pay | Admitting: Obstetrics and Gynecology

## 2023-10-14 DIAGNOSIS — Z1231 Encounter for screening mammogram for malignant neoplasm of breast: Secondary | ICD-10-CM

## 2023-11-25 ENCOUNTER — Ambulatory Visit: Admitting: Internal Medicine

## 2023-11-25 ENCOUNTER — Encounter: Payer: Self-pay | Admitting: Internal Medicine

## 2023-11-25 VITALS — BP 126/70 | HR 92 | Temp 99.2°F | Ht 67.0 in | Wt 379.4 lb

## 2023-11-25 DIAGNOSIS — M5440 Lumbago with sciatica, unspecified side: Secondary | ICD-10-CM | POA: Diagnosis not present

## 2023-11-25 DIAGNOSIS — J4520 Mild intermittent asthma, uncomplicated: Secondary | ICD-10-CM

## 2023-11-25 DIAGNOSIS — G8929 Other chronic pain: Secondary | ICD-10-CM | POA: Diagnosis not present

## 2023-11-25 DIAGNOSIS — F419 Anxiety disorder, unspecified: Secondary | ICD-10-CM | POA: Diagnosis not present

## 2023-11-25 DIAGNOSIS — Z23 Encounter for immunization: Secondary | ICD-10-CM | POA: Diagnosis not present

## 2023-11-25 MED ORDER — HYDROCODONE-ACETAMINOPHEN 10-325 MG PO TABS: 0.5000 | ORAL_TABLET | Freq: Four times a day (QID) | ORAL | 0 refills | Status: AC | PRN

## 2023-11-25 MED ORDER — ALPRAZOLAM 1 MG PO TABS: 1.0000 mg | ORAL_TABLET | Freq: Three times a day (TID) | ORAL | 3 refills | Status: DC | PRN

## 2023-11-25 NOTE — Assessment & Plan Note (Signed)
 Chronic  Cont on Xanax prn Potential benefits of a long term benzodiazepines  use as well as potential risks  and complications were explained to the patient and were aknowledged. Situational. Continue with Wellbutrin and Paxil.  Psychology consultation - Seeing Dr Bosie Clos for counseling. Better

## 2023-11-25 NOTE — Assessment & Plan Note (Signed)
 Chronic Cont on Norco prn - 10/325 0.5 tab QID  Potential benefits of a long term opioids use as well as potential risks (i.e. addiction risk, apnea etc) and complications (i.e. Somnolence, constipation and others) were explained to the patient and were aknowledged. Blue-Emu cream was recommended to use 2-3 times a day

## 2023-11-25 NOTE — Assessment & Plan Note (Signed)
Treat allergies ?

## 2023-11-25 NOTE — Addendum Note (Signed)
 Addended byBETHA LUCETTA CLEATRICE LELON on: 11/25/2023 03:40 PM   Modules accepted: Orders

## 2023-11-25 NOTE — Progress Notes (Addendum)
 Subjective:  Patient ID: Stephanie Harper, female    DOB: February 03, 1970  Age: 53 y.o. 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  Outpatient Medications Prior to Visit  Medication Sig Dispense Refill   albuterol  (VENTOLIN  HFA) 108 (90 Base) MCG/ACT inhaler INHALE 2 PUFFS BY MOUTH EVERY 6 HOURS AS NEEDED 6.7 each 3   buPROPion  ER (WELLBUTRIN  SR) 100 MG 12 hr tablet TAKE 1 TABLET BY MOUTH TWICE A DAY 180 tablet 2   escitalopram  (LEXAPRO ) 20 MG tablet TAKE 1 TABLET BY MOUTH DAILY AT BEDTIME 90 tablet 1   furosemide  (LASIX ) 40 MG tablet Take 1 tablet (40 mg total) by mouth daily as needed. 90 tablet 3   ibuprofen  (ADVIL ) 600 MG tablet TAKE 1 TABLET (600 MG TOTAL) BY MOUTH 2 (TWO) TIMES DAILY AS NEEDED. 180 tablet 1   Melatonin 10 MG CAPS Take 10 mg by mouth at bedtime as needed (Sleep).     metFORMIN (GLUCOPHAGE-XR) 500 MG 24 hr tablet 1 tablet with evening meal Orally Once a day; Duration: 30 day(s)     Multiple Vitamin (MULTIVITAMIN) tablet Take 1 tablet by mouth in the morning.     niacinamide 500 MG tablet Take 500 mg by mouth 2 (two) times daily.      pantoprazole  (PROTONIX ) 40 MG tablet Oral; Duration: 30 Days     pentoxifylline (TRENTAL) 400 MG CR tablet Take 400 mg by mouth in the morning and at bedtime.     WEGOVY  0.25 MG/0.5ML SOAJ SQ injection 0.5 mL Subcutaneous once a week; Duration: 28 days     ALPRAZolam  (XANAX ) 1 MG tablet May take 1 tablet (1 mg total) by mouth 3 (three) times daily as needed. May also take 1 tablet (1 mg total) 3 (three) times daily as needed. 90 tablet 3   HYDROcodone -acetaminophen  (NORCO) 10-325 MG tablet Take 0.5 tablets by mouth every 6 (six) hours as needed. 60 tablet 0   HYDROcodone -acetaminophen  (NORCO) 10-325 MG tablet Take 0.5 tablets by mouth every 6 (six) hours as needed for severe pain (pain score 7-10).  60 tablet 0   HYDROcodone -acetaminophen  (NORCO) 10-325 MG tablet Take 0.5 tablets by mouth every 6 (six) hours as needed. 60 tablet 0   triamterene -hydrochlorothiazide (MAXZIDE-25) 37.5-25 MG tablet TAKE 0.5-1 TABLETS BY MOUTH DAILY. 90 tablet 3   Cholecalciferol  (VITAMIN D3) 50 MCG (2000 UT) capsule Take 1 capsule (2,000 Units total) by mouth daily. (Patient not taking: Reported on 11/25/2023) 100 capsule 3   meloxicam  (MOBIC ) 15 MG tablet TAKE 1 TABLET (15 MG TOTAL) BY MOUTH DAILY. (Patient not taking: Reported on 11/25/2023) 30 tablet 3   triamcinolone  ointment (KENALOG ) 0.1 % Apply 1 Application topically daily as needed (Dermatitis). (Patient not taking: Reported on 11/25/2023)     No facility-administered medications prior to visit.    ROS: Review of Systems  Constitutional:  Negative for activity change, appetite change, chills, fatigue and unexpected weight change.  HENT:  Negative for congestion, mouth sores and sinus pressure.   Eyes:  Negative for visual disturbance.  Respiratory:  Negative for cough and chest tightness.   Gastrointestinal:  Negative for abdominal pain and nausea.  Genitourinary:  Negative for difficulty urinating, frequency and vaginal pain.  Musculoskeletal:  Negative for back pain and gait problem.  Skin:  Negative for pallor and rash.  Neurological:  Negative for  dizziness, tremors, weakness, numbness and headaches.  Psychiatric/Behavioral:  Negative for confusion and sleep disturbance.     Objective:  BP 126/70   Pulse 92   Temp 99.2 F (37.3 C)   Ht 5' 7 (1.702 m)   Wt (!) 379 lb 6.4 oz (172.1 kg)   SpO2 99%   BMI 59.42 kg/m   BP Readings from Last 3 Encounters:  11/25/23 126/70  08/25/23 135/81  08/05/23 (!) 148/58    Wt Readings from Last 3 Encounters:  11/25/23 (!) 379 lb 6.4 oz (172.1 kg)  08/25/23 (!) 383 lb (173.7 kg)  08/05/23 (!) 395 lb (179.2 kg)    Physical Exam Constitutional:      General: She is not in acute distress.     Appearance: She is well-developed.  HENT:     Head: Normocephalic.     Right Ear: External ear normal.     Left Ear: External ear normal.     Nose: Nose normal.  Eyes:     General:        Right eye: No discharge.        Left eye: No discharge.     Conjunctiva/sclera: Conjunctivae normal.     Pupils: Pupils are equal, round, and reactive to light.  Neck:     Thyroid : No thyromegaly.     Vascular: No JVD.     Trachea: No tracheal deviation.  Cardiovascular:     Rate and Rhythm: Normal rate and regular rhythm.     Heart sounds: Normal heart sounds.  Pulmonary:     Effort: No respiratory distress.     Breath sounds: No stridor. No wheezing.  Abdominal:     General: Bowel sounds are normal. There is no distension.     Palpations: Abdomen is soft. There is no mass.     Tenderness: There is no abdominal tenderness. There is no guarding or rebound.  Musculoskeletal:        General: No tenderness.     Cervical back: Normal range of motion and neck supple. No rigidity.  Lymphadenopathy:     Cervical: No cervical adenopathy.  Skin:    Findings: No erythema or rash.  Neurological:     Cranial Nerves: No cranial nerve deficit.     Motor: No abnormal muscle tone.     Coordination: Coordination normal.     Deep Tendon Reflexes: Reflexes normal.  Psychiatric:        Behavior: Behavior normal.        Thought Content: Thought content normal.        Judgment: Judgment normal.     Lab Results  Component Value Date   WBC 8.6 03/13/2023   HGB 15.6 (H) 03/13/2023   HCT 47.0 (H) 03/13/2023   PLT 208 03/13/2023   GLUCOSE 87 05/22/2023   CHOL 171 01/24/2021   TRIG 102.0 01/24/2021   HDL 49.50 01/24/2021   LDLCALC 101 (H) 01/24/2021   ALT 23 05/22/2023   AST 20 05/22/2023   NA 137 05/22/2023   K 3.5 05/22/2023   CL 98 05/22/2023   CREATININE 0.54 05/22/2023   BUN 12 05/22/2023   CO2 31 05/22/2023   TSH 4.01 05/22/2023   INR 1.0 08/09/2007   HGBA1C 6.2 03/26/2023    CT  ADRENAL ABDOMEN WO CONTRAST Result Date: 10/01/2023 CLINICAL DATA:  Hyperandrogenism. EXAM: CT ABDOMEN WITHOUT CONTRAST TECHNIQUE: Multidetector CT imaging of the abdomen was performed following the standard protocol without IV contrast. RADIATION DOSE REDUCTION: This  exam was performed according to the departmental dose-optimization program which includes automated exposure control, adjustment of the mA and/or kV according to patient size and/or use of iterative reconstruction technique. COMPARISON:  11/16/2021. FINDINGS: Lower chest: No acute findings. Heart is enlarged. No pericardial or pleural effusion. Distal esophagus is grossly unremarkable. Hepatobiliary: Liver is enlarged, measuring 22.8 cm. Cholecystectomy. No biliary ductal dilatation. Pancreas: Negative. Spleen: Negative. Adrenals/Urinary Tract: Adrenal glands and right kidney are unremarkable. Small left renal stone. Left kidney is otherwise unremarkable. Stomach/Bowel: Small duodenal diverticulum. Stomach and visualized portions of the small bowel and colon are otherwise unremarkable. Vascular/Lymphatic: Vascular structures are unremarkable. No pathologically enlarged lymph nodes. Other: Mild chronic haziness in the small bowel mesentery. Mesenteries and peritoneum are otherwise unremarkable. No free fluid. Musculoskeletal: Degenerative changes in the spine. IMPRESSION: 1. No adrenal mass. 2. Hepatomegaly. 3. Small left renal stone. Electronically Signed   By: Newell Eke M.D.   On: 10/01/2023 16:29    Assessment & Plan:   Problem List Items Addressed This Visit     Anosmia   Chronic.  Will obtain ENT consultation      Asthma   Treat allergies      Chronic anxiety   Chronic  Cont on Xanax  prn Potential benefits of a long term benzodiazepines  use as well as potential risks  and complications were explained to the patient and were aknowledged. Situational. Continue with Wellbutrin  and Paxil .  Psychology consultation - Seeing Dr  Dianna for counseling. Better      LOW BACK PAIN - Primary   Chronic Cont on Norco prn - 10/325 0.5 tab QID  Potential benefits of a long term opioids use as well as potential risks (i.e. addiction risk, apnea etc) and complications (i.e. Somnolence, constipation and others) were explained to the patient and were aknowledged. Blue-Emu cream was recommended to use 2-3 times a day      Relevant Medications   HYDROcodone -acetaminophen  (NORCO) 10-325 MG tablet   HYDROcodone -acetaminophen  (NORCO) 10-325 MG tablet   HYDROcodone -acetaminophen  (NORCO) 10-325 MG tablet   Other Visit Diagnoses       Immunization due       Relevant Orders   Flu vaccine trivalent PF, 6mos and older(Flulaval,Afluria,Fluarix,Fluzone) (Completed)         Meds ordered this encounter  Medications   HYDROcodone -acetaminophen  (NORCO) 10-325 MG tablet    Sig: Take 0.5 tablets by mouth every 6 (six) hours as needed.    Dispense:  60 tablet    Refill:  0    Please fill on or after 11/21/23. Code: M54.50   HYDROcodone -acetaminophen  (NORCO) 10-325 MG tablet    Sig: Take 0.5 tablets by mouth every 6 (six) hours as needed for severe pain (pain score 7-10).    Dispense:  60 tablet    Refill:  0    Please fill on or after 12/21/23. Code: M54.50   HYDROcodone -acetaminophen  (NORCO) 10-325 MG tablet    Sig: Take 0.5 tablets by mouth every 6 (six) hours as needed.    Dispense:  60 tablet    Refill:  0    Please fill on or after 01/20/24. Code: M54.50   DISCONTD: ALPRAZolam  (XANAX ) 1 MG tablet    Sig: Take 1 tablet (1 mg total) by mouth 3 (three) times daily as needed. May take 1 tablet (1 mg total) by mouth 3 (three) times daily as needed.    Dispense:  90 tablet    Refill:  3      Follow-up:  Return in about 6 months (around 05/24/2024) for a follow-up visit.  Marolyn Noel, MD

## 2023-12-05 ENCOUNTER — Other Ambulatory Visit: Payer: Self-pay | Admitting: Internal Medicine

## 2023-12-08 ENCOUNTER — Encounter: Payer: Self-pay | Admitting: Internal Medicine

## 2023-12-10 ENCOUNTER — Other Ambulatory Visit: Payer: Self-pay | Admitting: Internal Medicine

## 2023-12-10 DIAGNOSIS — R438 Other disturbances of smell and taste: Secondary | ICD-10-CM

## 2023-12-11 ENCOUNTER — Other Ambulatory Visit: Payer: Self-pay | Admitting: Internal Medicine

## 2023-12-16 ENCOUNTER — Ambulatory Visit
Admission: RE | Admit: 2023-12-16 | Discharge: 2023-12-16 | Disposition: A | Source: Ambulatory Visit | Attending: Obstetrics and Gynecology | Admitting: Obstetrics and Gynecology

## 2023-12-16 DIAGNOSIS — Z1231 Encounter for screening mammogram for malignant neoplasm of breast: Secondary | ICD-10-CM

## 2023-12-22 ENCOUNTER — Ambulatory Visit

## 2023-12-24 DIAGNOSIS — R430 Anosmia: Secondary | ICD-10-CM | POA: Insufficient documentation

## 2023-12-24 NOTE — Assessment & Plan Note (Signed)
 Chronic.  Will obtain ENT consultation

## 2023-12-26 ENCOUNTER — Emergency Department (HOSPITAL_COMMUNITY)
Admission: EM | Admit: 2023-12-26 | Discharge: 2023-12-26 | Disposition: A | Discharge status: Home / Self Care | Attending: Emergency Medicine | Admitting: Emergency Medicine

## 2023-12-26 ENCOUNTER — Emergency Department (HOSPITAL_COMMUNITY)

## 2023-12-26 DIAGNOSIS — Z79899 Other long term (current) drug therapy: Secondary | ICD-10-CM | POA: Insufficient documentation

## 2023-12-26 DIAGNOSIS — N23 Unspecified renal colic: Secondary | ICD-10-CM | POA: Insufficient documentation

## 2023-12-26 DIAGNOSIS — Z96611 Presence of right artificial shoulder joint: Secondary | ICD-10-CM | POA: Insufficient documentation

## 2023-12-26 DIAGNOSIS — I10 Essential (primary) hypertension: Secondary | ICD-10-CM | POA: Diagnosis not present

## 2023-12-26 DIAGNOSIS — J45909 Unspecified asthma, uncomplicated: Secondary | ICD-10-CM | POA: Diagnosis not present

## 2023-12-26 DIAGNOSIS — R109 Unspecified abdominal pain: Secondary | ICD-10-CM | POA: Diagnosis present

## 2023-12-26 LAB — COMPREHENSIVE METABOLIC PANEL WITH GFR
ALT: 20 U/L (ref 0–44)
AST: 20 U/L (ref 15–41)
Albumin: 4 g/dL (ref 3.5–5.0)
Alkaline Phosphatase: 102 U/L (ref 38–126)
Anion gap: 12 (ref 5–15)
BUN: 15 mg/dL (ref 6–20)
CO2: 28 mmol/L (ref 22–32)
Calcium: 9.5 mg/dL (ref 8.9–10.3)
Chloride: 98 mmol/L (ref 98–111)
Creatinine, Ser: 0.87 mg/dL (ref 0.44–1.00)
GFR, Estimated: >60 mL/min
Glucose, Bld: 106 mg/dL — ABNORMAL HIGH (ref 70–99)
Potassium: 3.4 mmol/L — ABNORMAL LOW (ref 3.5–5.1)
Sodium: 138 mmol/L (ref 135–145)
Total Bilirubin: 0.6 mg/dL (ref 0.0–1.2)
Total Protein: 7.5 g/dL (ref 6.5–8.1)

## 2023-12-26 LAB — CBC WITH DIFFERENTIAL/PLATELET
Abs Immature Granulocytes: 0.06 K/uL (ref 0.00–0.07)
Basophils Absolute: 0.1 K/uL (ref 0.0–0.1)
Basophils Relative: 0 %
Eosinophils Absolute: 0.1 K/uL (ref 0.0–0.5)
Eosinophils Relative: 1 %
HCT: 45.2 % (ref 36.0–46.0)
Hemoglobin: 15 g/dL (ref 12.0–15.0)
Immature Granulocytes: 0 %
Lymphocytes Relative: 13 %
Lymphs Abs: 1.9 K/uL (ref 0.7–4.0)
MCH: 30.1 pg (ref 26.0–34.0)
MCHC: 33.2 g/dL (ref 30.0–36.0)
MCV: 90.6 fL (ref 80.0–100.0)
Monocytes Absolute: 1.1 K/uL — ABNORMAL HIGH (ref 0.1–1.0)
Monocytes Relative: 8 %
Neutro Abs: 11.2 K/uL — ABNORMAL HIGH (ref 1.7–7.7)
Neutrophils Relative %: 78 %
Platelets: 351 K/uL (ref 150–400)
RBC: 4.99 MIL/uL (ref 3.87–5.11)
RDW: 13.6 % (ref 11.5–15.5)
WBC: 14.5 K/uL — ABNORMAL HIGH (ref 4.0–10.5)
nRBC: 0 % (ref 0.0–0.2)

## 2023-12-26 LAB — URINALYSIS, ROUTINE W REFLEX MICROSCOPIC
Bilirubin Urine: NEGATIVE
Glucose, UA: NEGATIVE mg/dL
Hgb urine dipstick: NEGATIVE
Ketones, ur: NEGATIVE mg/dL
Leukocytes,Ua: NEGATIVE
Nitrite: NEGATIVE
Protein, ur: NEGATIVE mg/dL
Specific Gravity, Urine: 1.015 (ref 1.005–1.030)
pH: 7 (ref 5.0–8.0)

## 2023-12-26 MED ORDER — ONDANSETRON HCL 4 MG/2ML IJ SOLN: 4.0000 mg | Freq: Once | INTRAMUSCULAR | Status: DC

## 2023-12-26 MED ORDER — KETOROLAC TROMETHAMINE 15 MG/ML IJ SOLN: 15.0000 mg | Freq: Once | INTRAMUSCULAR | Status: DC

## 2023-12-26 MED ORDER — SODIUM CHLORIDE 0.9 % IV BOLUS: 1000.0000 mL | Freq: Once | INTRAVENOUS | Status: DC

## 2023-12-26 NOTE — ED Triage Notes (Signed)
 Pt comes in for left flank pain, n/v. Pt states pain started this morning. A&Ox4.    Pt has hx of kidney stones back in July of this year. A&Ox4.

## 2023-12-26 NOTE — ED Notes (Signed)
 Pt passed a stone with last urination.  Pt states pain is gone. Pt states she does not need pain med or zofran .  Pt did agree for CT .

## 2023-12-26 NOTE — ED Provider Notes (Signed)
 " [facility] Provider Note  CSN: [id number] Arrival date & time: 12/26/23 1524  Chief Complaint(s) No chief complaint on file.  HPI Stephanie Harper is a 53 y.o. female with history of obesity, hypertension, diabetes presenting to the emergency department with left-sided flank pain.  She reports that started today.  Reports associated nausea, no vomiting.  No fevers or chills.  No urinary symptoms.  Pain radiates to abdomen.  No chest pain, shortness of breath.   Past Medical History Past Medical History:  Diagnosis Date   Anal fissure 2012   Dr Teressa   Anxiety    Arthritis    Asthma    COLD WEATHER RELATED, bronchitis   Baker's cyst of knee    Right knee   Complication of anesthesia    PANIC ATTACK RIGHT BEFORE SHOULDER REPLACMENT SURGERY AT [facility]; PT STATES CLAUSTROPHOBIA AND DOES NOT WANT TO BE AWARE OF BEING STRAPPED DOWN IN OR   Depression    History of kidney stones    HTN (hypertension)    after having lap band surgery she was taken off medications   LBP (low back pain)    Obesity    Osteomyelitis (HCC)    at [facility]   Pain    HX OF BILATERAL SHOULDER SURGERIES-CHRONIC SHOULDER PAIN ;  HX OF BILATERAL KNEE PAIN   Sepsis(995.91) 2005   From shoulder replacement surgery   Shoulder fracture, left    Patient Active Problem List   Diagnosis Date Noted   Anosmia 12/24/2023   History of bariatric surgery 08/25/2023   Hyperandrogenism 08/25/2023   Prediabetes 08/25/2023   Hypokalemia 03/26/2023   Hyperglycemia 03/26/2023   Ringworm 02/27/2022   Need for hepatitis C screening test 12/02/2021   Hematuria, gross 10/02/2021   Glucose intolerance 04/24/2021   Otitis media 04/25/2020   Hemorrhoids 01/25/2019   Leukocytosis 09/07/2018   Palpitations 01/19/2018   Heat stroke 07/22/2016   Necrobiosis lipoidica 11/22/2015   Vitamin D  deficiency 07/22/2014   Well adult exam 04/26/2014   Rash and nonspecific skin eruption  01/25/2014   MDD (major depressive disorder) 06/17/2013   Orthostatic lightheadedness 06/09/2012   Venous insufficiency of leg 03/04/2012   Lapband APS Jan 2014 01/21/2012   Arthralgia 10/04/2011   Night sweats 06/05/2010   ANAL OR RECTAL PAIN 01/05/2010   HEMATOCHEZIA 01/05/2010   ELBOW PAIN 10/31/2009   Acute upper respiratory infection 12/06/2008   SHOULDER PAIN 12/06/2008   CONTACT DERMATITIS 06/03/2008   OBESITY, MORBID 05/04/2008   TOBACCO USE DISORDER/SMOKER-SMOKING CESSATION DISCUSSED 07/28/2007   Chronic anxiety 12/26/2006   PANIC ATTACK 12/26/2006   Essential hypertension 12/26/2006   Asthma 12/26/2006   Depression 08/28/2006   LOW BACK PAIN 08/28/2006   Home Medication(s) Prior to Admission medications  Medication Sig Start Date End Date Taking? Authorizing Provider  albuterol  (VENTOLIN  HFA) 108 (90 Base) MCG/ACT inhaler INHALE 2 PUFFS BY MOUTH EVERY 6 HOURS AS NEEDED 06/25/23   Plotnikov, Aleksei V, MD  ALPRAZolam  (XANAX ) 1 MG tablet MAY TAKE 1 TABLET (1 MG TOTAL) BY MOUTH 3 (THREE) TIMES DAILY AS NEEDED 12/06/23   Plotnikov, Aleksei V, MD  buPROPion  ER (WELLBUTRIN  SR) 100 MG 12 hr tablet TAKE 1 TABLET BY MOUTH TWICE A DAY 08/17/23   Plotnikov, Aleksei V, MD  Cholecalciferol  (VITAMIN D3) 50 MCG (2000 UT) capsule Take 1 capsule (2,000 Units total) by mouth daily. Patient not taking: Reported on 11/25/2023 04/24/21   Plotnikov, Karlynn GAILS, MD  escitalopram  (LEXAPRO ) 20 MG tablet TAKE 1 TABLET BY MOUTH DAILY AT BEDTIME 07/04/23   Plotnikov, Aleksei V, MD  furosemide  (LASIX ) 40 MG tablet Take 1 tablet (40 mg total) by mouth daily as needed. 06/15/19   Plotnikov, Aleksei V, MD  HYDROcodone -acetaminophen  (NORCO) 10-325 MG tablet Take 0.5 tablets by mouth every 6 (six) hours as needed. 11/25/23   Plotnikov, Aleksei V, MD  HYDROcodone -acetaminophen  (NORCO) 10-325 MG tablet Take 0.5 tablets by mouth every 6 (six) hours as needed for severe pain (pain score 7-10). 11/25/23   Plotnikov,  Aleksei V, MD  HYDROcodone -acetaminophen  (NORCO) 10-325 MG tablet Take 0.5 tablets by mouth every 6 (six) hours as needed. 11/25/23   Plotnikov, Karlynn GAILS, MD  ibuprofen  (ADVIL ) 600 MG tablet TAKE 1 TABLET (600 MG TOTAL) BY MOUTH 2 (TWO) TIMES DAILY AS NEEDED. 07/21/23   Plotnikov, Aleksei V, MD  Melatonin 10 MG CAPS Take 10 mg by mouth at bedtime as needed (Sleep).    [provider]  meloxicam  (MOBIC ) 15 MG tablet TAKE 1 TABLET (15 MG TOTAL) BY MOUTH DAILY. Patient not taking: Reported on 11/25/2023 03/20/23   Plotnikov, Aleksei V, MD  metFORMIN (GLUCOPHAGE-XR) 500 MG 24 hr tablet 1 tablet with evening meal Orally Once a day; Duration: 30 day(s) 08/13/23   [provider]  Multiple Vitamin (MULTIVITAMIN) tablet Take 1 tablet by mouth in the morning.    [provider]  niacinamide 500 MG tablet Take 500 mg by mouth 2 (two) times daily.  12/20/16   [provider]  pantoprazole  (PROTONIX ) 40 MG tablet Oral; Duration: 30 Days    [provider]  pentoxifylline (TRENTAL) 400 MG CR tablet Take 400 mg by mouth in the morning and at bedtime. 10/11/15   [provider]  triamcinolone  ointment (KENALOG ) 0.1 % Apply 1 Application topically daily as needed (Dermatitis). Patient not taking: Reported on 11/25/2023 01/02/23   [provider]  triamterene -hydrochlorothiazide (MAXZIDE-25) 37.5-25 MG tablet TAKE 0.5-1 TABLETS BY MOUTH DAILY. 12/11/23   Plotnikov, Karlynn GAILS, MD  WEGOVY  0.25 MG/0.5ML SOAJ SQ injection 0.5 mL Subcutaneous once a week; Duration: 28 days 08/13/23   [provider]                                                                                                                                    Past Surgical History Past Surgical History:  Procedure Laterality Date   APPENDECTOMY     age 53   BREATH 65 H PYLORI  07/15/2011   Procedure: BREATH TEK H PYLORI;  Surgeon: Donnice KATHEE Lunger, MD;  Location: [facility];   Service: General;  Laterality: N/A;   CHOLECYSTECTOMY  1997   CYSTOSCOPY/URETEROSCOPY/HOLMIUM LASER/STENT PLACEMENT Bilateral 04/16/2022   Procedure: CYSTOSCOPY BILATERAL DIAGNOSTIC URETEROSCOPY, BILATERAL RETROGRADE PYELOGRAM, AND BILATERAL URETERAL STENT PLACEMENT;  Surgeon: Elisabeth Valli BIRCH, MD;  Location: [facility];  Service: Urology;  Laterality: Bilateral;  90 MINUTES  CYSTOSCOPY/URETEROSCOPY/HOLMIUM LASER/STENT PLACEMENT Bilateral 05/07/2022   Procedure: DIAGNOSTIC RIGHT URETEROSCOPY WITH STENT EXCHANGE, DIAGNOSTIC LEFT URETEROSCOPY WITH BIOPSY, HOLMIUM LASER LITHOTRIPSY  AND STENT EXCHANGE;  Surgeon: Elisabeth Valli BIRCH, MD;  Location: [facility];  Service: Urology;  Laterality: Bilateral;  90 MINUTES   FRACTURE SURGERY Left 2009   left shoulder   LAPAROSCOPIC GASTRIC BANDING  01/21/2012   Procedure: LAPAROSCOPIC GASTRIC BANDING;  Surgeon: Donnice KATHEE Lunger, MD;  Location: [facility];  Service: General;  Laterality: N/A;   LASER ABLATION  2006   MESH APPLIED TO LAP PORT  01/21/2012   Procedure: MESH APPLIED TO LAP PORT;  Surgeon: Donnice KATHEE Lunger, MD;  Location: [facility];  Service: General;;   PLANTAR FASCIA RELEASE Bilateral 01/07/1994   PORT-A-CATH REMOVAL N/A 02/03/2023   Procedure: SUBCUTANEOUS REMOVAL PORT-A-CATH;  Surgeon: Stevie Herlene Righter, MD;  Location: [facility];  Service: General;  Laterality: N/A;   SHOULDER ARTHROSCOPY Right 07/27/2019   Procedure: RIGHT SHOUDLER ARTHROSCOPY, TISSUE SAMPLE;  Surgeon: Addie Cordella Hamilton, MD;  Location: [facility];  Service: Orthopedics;  Laterality: Right;   SHOULDER ARTHROSCOPY WITH OPEN ROTATOR CUFF REPAIR AND DISTAL CLAVICLE ACROMINECTOMY Left 2004   TONSILLECTOMY     at age 80   TOTAL SHOULDER REPLACEMENT Right 2004   redo open 05/2002   TUBAL LIGATION  2006   UTERINE ABLATION WAS DONE AT THE TIME OF TUBAL LIGATION   Family History Family History  Problem Relation Age of Onset   Arthritis Mother    Diabetes Mother    Hyperlipidemia Mother     Hypertension Mother    Arthritis Father    Diabetes Father    Hyperlipidemia Father    Hypertension Father    Heart disease Father 41   Bipolar disorder Brother    Coronary artery disease Other    Hyperlipidemia Other    Hypertension Other    Anxiety disorder Neg Hx    Depression Neg Hx    Dementia Neg Hx    Alcohol abuse Neg Hx    Drug abuse Neg Hx    Schizophrenia Neg Hx     Social History Social History[1] Allergies Adhesive [tape]  Review of Systems Review of Systems  All other systems reviewed and are negative.   Physical Exam Vital Signs  I have reviewed the triage vital signs BP (!) 169/109 (BP Location: Right Arm)   Pulse 96   Temp 97.9 F (36.6 C) (Oral)   Resp 20   Ht 5' 8 (1.727 m)   Wt (!) 169.6 kg   SpO2 97%   BMI 56.87 kg/m  Physical Exam Vitals and nursing note reviewed.  Constitutional:      General: She is not in acute distress.    Appearance: She is well-developed.  HENT:     Head: Normocephalic and atraumatic.     Mouth/Throat:     Mouth: Mucous membranes are moist.  Eyes:     Pupils: Pupils are equal, round, and reactive to light.  Cardiovascular:     Rate and Rhythm: Normal rate and regular rhythm.     Heart sounds: No murmur heard. Pulmonary:     Effort: Pulmonary effort is normal. No respiratory distress.     Breath sounds: Normal breath sounds.  Abdominal:     General: Abdomen is flat.     Palpations: Abdomen is soft.     Tenderness: There is no abdominal tenderness. There is left CVA tenderness.  Musculoskeletal:  General: No tenderness.     Right lower leg: No edema.     Left lower leg: No edema.  Skin:    General: Skin is warm and dry.  Neurological:     General: No focal deficit present.     Mental Status: She is alert. Mental status is at baseline.  Psychiatric:        Mood and Affect: Mood normal.        Behavior: Behavior normal.     ED Results and Treatments Labs (all labs ordered are listed, but  only abnormal results are displayed) Labs Reviewed  CBC WITH DIFFERENTIAL/PLATELET - Abnormal; Notable for the following components:      Result Value   WBC 14.5 (*)    Neutro Abs 11.2 (*)    Monocytes Absolute 1.1 (*)    All other components within normal limits  COMPREHENSIVE METABOLIC PANEL WITH GFR - Abnormal; Notable for the following components:   Potassium 3.4 (*)    Glucose, Bld 106 (*)    All other components within normal limits  URINALYSIS, ROUTINE W REFLEX MICROSCOPIC                                                                                                                          Radiology CT Renal Stone Study Result Date: 12/26/2023 CLINICAL DATA:  Abdominal/flank pain, stone suspected. EXAM: CT ABDOMEN AND PELVIS WITHOUT CONTRAST TECHNIQUE: Multidetector CT imaging of the abdomen and pelvis was performed following the standard protocol without IV contrast. RADIATION DOSE REDUCTION: This exam was performed according to the departmental dose-optimization program which includes automated exposure control, adjustment of the mA and/or kV according to patient size and/or use of iterative reconstruction technique. COMPARISON:  CT scan abdomen and pelvis from 10/01/2023. FINDINGS: Lower chest: The lung bases are clear. No pleural effusion. The heart is normal in size. No pericardial effusion. Hepatobiliary: The liver is moderately enlarged in size. Non-cirrhotic configuration. No suspicious mass. No intrahepatic or extrahepatic bile duct dilation. Gallbladder is surgically absent. Pancreas: Atrophy/fatty replacement of pancreas which is otherwise unremarkable. Spleen: Within normal limits. No focal lesion. Adrenals/Urinary Tract: Adrenal glands are unremarkable. No suspicious renal mass within the limitations of this unenhanced exam. There are 2, sub 5 mm nonobstructing calculi in the left kidney. No left ureterolithiasis. No right nephroureterolithiasis. No obstructive uropathy on  either side. Urinary bladder is under distended, precluding optimal assessment. However, no large mass or stones identified. No perivesical fat stranding. Stomach/Bowel: There is a small sliding hiatal hernia. There is a small diverticulum arising from the second part of duodenum. No disproportionate dilation of the small or large bowel loops. No evidence of abnormal bowel wall thickening or inflammatory changes. The appendix was not visualized; however there is no acute inflammatory process in the right lower quadrant. Vascular/Lymphatic: No ascites or pneumoperitoneum. No abdominal or pelvic lymphadenopathy, by size criteria. No aneurysmal dilation of the major abdominal arteries. Reproductive: The uterus is unremarkable. No large  adnexal mass. Other: There is a tiny fat containing umbilical hernia. The soft tissues and abdominal wall are otherwise unremarkable. Musculoskeletal: No suspicious osseous lesions. There are mild multilevel degenerative changes in the visualized spine. IMPRESSION: 1. There are 2, sub 5 mm nonobstructing calculi in the left kidney. No obstructive uropathy on either side. No right nephroureterolithiasis. 2. Multiple other nonacute observations, as described above. Electronically Signed   By: Ree Molt M.D.   On: 12/26/2023 17:28    Pertinent labs & imaging results that were available during my care of the patient were reviewed by me and considered in my medical decision making (see MDM for details).  Medications Ordered in ED Medications - No data to display                                                                                                                                    Procedures Procedures  (including critical care time)  Medical Decision Making / ED Course   MDM:  53 year old presenting to the emergency department with flank pain.  Patient overall well-appearing, does have some left CVA tenderness.  Per nursing patient after my evaluation  reported that she was able to urinate a stone and feels better.  Will reassess.  Symptoms seem very consistent with a kidney stone.  Will check urinalysis also.  If symptoms have resolved may not need CT scan.  Clinical Course as of 12/26/23 1736  Fri Dec 26, 2023  1733 Symptoms have all resolved.  Patient does have a passed kidney stone which is in a urine specimen cup. UA negative. CT scan is negative for any acute process. Will discharge patient to home. All questions answered. Patient comfortable with plan of discharge. Return precautions discussed with patient and specified on the after visit summary.  [WS]    Clinical Course User Index [WS] Francesca Elsie CROME, MD     Additional history obtained: -Additional history obtained from spouse    Lab Tests: -I ordered, reviewed, and interpreted labs.   The pertinent results include:   Labs Reviewed  CBC WITH DIFFERENTIAL/PLATELET - Abnormal; Notable for the following components:      Result Value   WBC 14.5 (*)    Neutro Abs 11.2 (*)    Monocytes Absolute 1.1 (*)    All other components within normal limits  COMPREHENSIVE METABOLIC PANEL WITH GFR - Abnormal; Notable for the following components:   Potassium 3.4 (*)    Glucose, Bld 106 (*)    All other components within normal limits  URINALYSIS, ROUTINE W REFLEX MICROSCOPIC       Imaging Studies ordered: I ordered imaging studies including CT scan On my interpretation imaging demonstrates no acute process I independently visualized and interpreted imaging. I agree with the radiologist interpretation   Medicines ordered and prescription drug management: Meds ordered this encounter  Medications   DISCONTD: sodium chloride  0.9 % bolus 1,000 mL  DISCONTD: ondansetron  (ZOFRAN ) injection 4 mg   DISCONTD: ketorolac  (TORADOL ) 15 MG/ML injection 15 mg    -I have reviewed the patients home medicines and have made adjustments as needed   Social Determinants of Health:   Diagnosis or treatment significantly limited by social determinants of health: obesity   Reevaluation: After the interventions noted above, I reevaluated the patient and found that their symptoms have resolved  Co morbidities that complicate the patient evaluation  Past Medical History:  Diagnosis Date   Anal fissure 2012   Dr Teressa   Anxiety    Arthritis    Asthma    COLD WEATHER RELATED, bronchitis   Baker's cyst of knee    Right knee   Complication of anesthesia    PANIC ATTACK RIGHT BEFORE SHOULDER REPLACMENT SURGERY AT [facility]; PT STATES CLAUSTROPHOBIA AND DOES NOT WANT TO BE AWARE OF BEING STRAPPED DOWN IN OR   Depression    History of kidney stones    HTN (hypertension)    after having lap band surgery she was taken off medications   LBP (low back pain)    Obesity    Osteomyelitis (HCC)    at [facility]   Pain    HX OF BILATERAL SHOULDER SURGERIES-CHRONIC SHOULDER PAIN ;  HX OF BILATERAL KNEE PAIN   Sepsis(995.91) 2005   From shoulder replacement surgery   Shoulder fracture, left       Dispostion: Disposition decision including need for hospitalization was considered, and patient discharged from emergency department.    Final Clinical Impression(s) / ED Diagnoses Final diagnoses:  Ureteral colic     This chart was dictated using voice recognition software.  Despite best efforts to proofread,  errors can occur which can change the documentation meaning.     [1]  Social History Tobacco Use   Smoking status: Every Day    Current packs/day: 1.00    Average packs/day: 1 pack/day for 43.4 years (43.4 ttl pk-yrs)    Types: Cigarettes    Start date: 08/12/1980    Passive exposure: Past   Smokeless tobacco: Never   Tobacco comments:    WAS SMOKING 2 PPD-HAS CUT BACK TO 1 PPD OR LESS  Vaping Use   Vaping status: Never Used  Substance Use Topics   Alcohol use: No   Drug use: No     Francesca Elsie CROME, MD 12/26/23 1737  "

## 2023-12-26 NOTE — Discharge Instructions (Addendum)
 We evaluated you for your flank pain.  We suspect your symptoms were due to a passed kidney stone.  Your symptoms resolved after passing a stone in the emergency department.  Your CT scan did show 2 small kidney stones that are still in your left kidney.  They should not cause any pain currently, but you may have recurrent symptoms at some point in the future if they move from the kidney into your urinary tract.  Please follow-up with your primary doctor.  If you would like to follow-up with urologist you can follow-up with Dr. Sherrilee.  If you have any recurrent symptoms, please return to the emergency department.

## 2023-12-27 ENCOUNTER — Other Ambulatory Visit: Payer: Self-pay | Admitting: Internal Medicine

## 2024-01-02 ENCOUNTER — Other Ambulatory Visit: Payer: Self-pay | Admitting: Internal Medicine

## 2024-01-15 ENCOUNTER — Ambulatory Visit
Admission: RE | Admit: 2024-01-15 | Discharge: 2024-01-15 | Disposition: A | Discharge status: Home / Self Care | Source: Ambulatory Visit | Attending: Obstetrics and Gynecology | Admitting: Obstetrics and Gynecology

## 2024-02-25 ENCOUNTER — Ambulatory Visit: Admitting: Internal Medicine
